# Patient Record
Sex: Female | Born: 1965
Health system: Southern US, Community
[De-identification: ages and names within clinical notes are randomized; demographics above are authoritative.]

## PROBLEM LIST (undated history)

## (undated) DIAGNOSIS — I2510 Atherosclerotic heart disease of native coronary artery without angina pectoris: Secondary | ICD-10-CM

## (undated) DIAGNOSIS — Q249 Congenital malformation of heart, unspecified: Secondary | ICD-10-CM

## (undated) DIAGNOSIS — I499 Cardiac arrhythmia, unspecified: Secondary | ICD-10-CM

## (undated) DIAGNOSIS — Z98891 History of uterine scar from previous surgery: Secondary | ICD-10-CM

## (undated) DIAGNOSIS — R0600 Dyspnea, unspecified: Secondary | ICD-10-CM

## (undated) DIAGNOSIS — Z955 Presence of coronary angioplasty implant and graft: Secondary | ICD-10-CM

## (undated) DIAGNOSIS — I219 Acute myocardial infarction, unspecified: Secondary | ICD-10-CM

## (undated) DIAGNOSIS — I10 Essential (primary) hypertension: Secondary | ICD-10-CM

## (undated) DIAGNOSIS — I519 Heart disease, unspecified: Secondary | ICD-10-CM

## (undated) DIAGNOSIS — Z87442 Personal history of urinary calculi: Secondary | ICD-10-CM

## (undated) DIAGNOSIS — F419 Anxiety disorder, unspecified: Secondary | ICD-10-CM

## (undated) DIAGNOSIS — N939 Abnormal uterine and vaginal bleeding, unspecified: Secondary | ICD-10-CM

## (undated) HISTORY — DX: Heart disease, unspecified: I51.9

## (undated) HISTORY — PX: CARDIAC SURGERY: SHX584

## (undated) HISTORY — DX: Morbid (severe) obesity due to excess calories: E66.01

## (undated) HISTORY — DX: Abnormal uterine and vaginal bleeding, unspecified: N93.9

## (undated) HISTORY — DX: Congenital malformation of heart, unspecified: Q24.9

## (undated) HISTORY — PX: CORONARY ANGIOPLASTY: SHX604

## (undated) HISTORY — PX: CARDIAC CATHETERIZATION: SHX172

---

## 2004-01-31 HISTORY — PX: CHOLECYSTECTOMY: SHX55

## 2009-01-30 HISTORY — PX: DILATION AND CURETTAGE OF UTERUS: SHX78

## 2009-01-30 HISTORY — PX: ENDOMETRIAL ABLATION: SHX621

## 2010-07-14 ENCOUNTER — Ambulatory Visit: Payer: Self-pay | Admitting: Internal Medicine

## 2012-11-21 ENCOUNTER — Ambulatory Visit: Payer: Self-pay | Admitting: Family Medicine

## 2014-04-06 ENCOUNTER — Other Ambulatory Visit: Payer: Self-pay

## 2014-04-06 ENCOUNTER — Encounter (HOSPITAL_COMMUNITY): Payer: Self-pay | Admitting: Physician Assistant

## 2014-04-06 ENCOUNTER — Inpatient Hospital Stay (HOSPITAL_COMMUNITY)
Admission: RE | Admit: 2014-04-06 | Discharge: 2014-04-09 | DRG: 247 | Disposition: A | Discharge status: Home / Self Care | Payer: BLUE CROSS/BLUE SHIELD | Source: Ambulatory Visit | Attending: Interventional Cardiology | Admitting: Interventional Cardiology

## 2014-04-06 ENCOUNTER — Encounter (HOSPITAL_COMMUNITY)
Admission: RE | Disposition: A | Discharge status: Home / Self Care | Payer: Self-pay | Source: Ambulatory Visit | Attending: Interventional Cardiology

## 2014-04-06 ENCOUNTER — Emergency Department: Payer: Self-pay | Admitting: Emergency Medicine

## 2014-04-06 DIAGNOSIS — I472 Ventricular tachycardia, unspecified: Secondary | ICD-10-CM | POA: Insufficient documentation

## 2014-04-06 DIAGNOSIS — Z8249 Family history of ischemic heart disease and other diseases of the circulatory system: Secondary | ICD-10-CM

## 2014-04-06 DIAGNOSIS — I2510 Atherosclerotic heart disease of native coronary artery without angina pectoris: Secondary | ICD-10-CM | POA: Diagnosis present

## 2014-04-06 DIAGNOSIS — R0789 Other chest pain: Secondary | ICD-10-CM | POA: Diagnosis present

## 2014-04-06 DIAGNOSIS — I2111 ST elevation (STEMI) myocardial infarction involving right coronary artery: Secondary | ICD-10-CM

## 2014-04-06 DIAGNOSIS — E785 Hyperlipidemia, unspecified: Secondary | ICD-10-CM | POA: Diagnosis present

## 2014-04-06 DIAGNOSIS — I2119 ST elevation (STEMI) myocardial infarction involving other coronary artery of inferior wall: Principal | ICD-10-CM | POA: Diagnosis present

## 2014-04-06 DIAGNOSIS — I10 Essential (primary) hypertension: Secondary | ICD-10-CM | POA: Diagnosis present

## 2014-04-06 DIAGNOSIS — I252 Old myocardial infarction: Secondary | ICD-10-CM | POA: Diagnosis present

## 2014-04-06 HISTORY — PX: LEFT HEART CATHETERIZATION WITH CORONARY ANGIOGRAM: SHX5451

## 2014-04-06 HISTORY — DX: Essential (primary) hypertension: I10

## 2014-04-06 LAB — COMPREHENSIVE METABOLIC PANEL
ALK PHOS: 95 U/L (ref 39–117)
ALT: 42 U/L — AB (ref 0–35)
AST: 73 U/L — ABNORMAL HIGH (ref 0–37)
Albumin: 4 g/dL (ref 3.5–5.2)
Anion gap: 9 (ref 5–15)
BUN: 17 mg/dL (ref 6–23)
CALCIUM: 9.1 mg/dL (ref 8.4–10.5)
CO2: 25 mmol/L (ref 19–32)
Chloride: 104 mmol/L (ref 96–112)
Creatinine, Ser: 0.7 mg/dL (ref 0.50–1.10)
GFR calc Af Amer: >90 mL/min (ref >90)
Glucose, Bld: 100 mg/dL — ABNORMAL HIGH (ref 70–99)
POTASSIUM: 3.8 mmol/L (ref 3.5–5.1)
SODIUM: 138 mmol/L (ref 135–145)
Total Bilirubin: 0.5 mg/dL (ref 0.3–1.2)
Total Protein: 7.1 g/dL (ref 6.0–8.3)

## 2014-04-06 LAB — CBC WITH DIFFERENTIAL/PLATELET
Basophils Absolute: 0 10*3/uL (ref 0.0–0.1)
Basophils Relative: 0 % (ref 0–1)
Eosinophils Absolute: 0 10*3/uL (ref 0.0–0.7)
Eosinophils Relative: 0 % (ref 0–5)
HCT: 38.5 % (ref 36.0–46.0)
HEMOGLOBIN: 13.5 g/dL (ref 12.0–15.0)
LYMPHS ABS: 1.7 10*3/uL (ref 0.7–4.0)
Lymphocytes Relative: 14 % (ref 12–46)
MCH: 28.9 pg (ref 26.0–34.0)
MCHC: 35.1 g/dL (ref 30.0–36.0)
MCV: 82.4 fL (ref 78.0–100.0)
MONOS PCT: 3 % (ref 3–12)
Monocytes Absolute: 0.3 10*3/uL (ref 0.1–1.0)
NEUTROS PCT: 83 % — AB (ref 43–77)
Neutro Abs: 10.2 10*3/uL — ABNORMAL HIGH (ref 1.7–7.7)
Platelets: 279 10*3/uL (ref 150–400)
RBC: 4.67 MIL/uL (ref 3.87–5.11)
RDW: 12.6 % (ref 11.5–15.5)
WBC: 12.2 10*3/uL — ABNORMAL HIGH (ref 4.0–10.5)

## 2014-04-06 LAB — TROPONIN I
Troponin I: 5.23 ng/mL (ref <0.031)
Troponin I: 30.11 ng/mL (ref <0.031)

## 2014-04-06 LAB — TSH: TSH: 2.145 u[IU]/mL (ref 0.350–4.500)

## 2014-04-06 LAB — MRSA PCR SCREENING: MRSA BY PCR: NEGATIVE

## 2014-04-06 SURGERY — LEFT HEART CATHETERIZATION WITH CORONARY ANGIOGRAM
Anesthesia: LOCAL

## 2014-04-06 MED ORDER — MORPHINE SULFATE 2 MG/ML IJ SOLN
2.0000 mg | INTRAMUSCULAR | Status: DC | PRN
  Administered 2014-04-06 (×2): 2 mg via INTRAVENOUS
  Filled 2014-04-06 (×2): qty 1

## 2014-04-06 MED ORDER — NITROGLYCERIN 0.4 MG SL SUBL
0.4000 mg | SUBLINGUAL_TABLET | SUBLINGUAL | Status: DC | PRN
  Administered 2014-04-06: 0.4 mg via SUBLINGUAL
  Filled 2014-04-06: qty 1

## 2014-04-06 MED ORDER — ASPIRIN EC 81 MG PO TBEC: 81.0000 mg | DELAYED_RELEASE_TABLET | Freq: Every day | ORAL | Status: DC

## 2014-04-06 MED ORDER — TICAGRELOR 90 MG PO TABS
90.0000 mg | ORAL_TABLET | Freq: Two times a day (BID) | ORAL | Status: DC
  Administered 2014-04-06 – 2014-04-09 (×6): 90 mg via ORAL
  Filled 2014-04-06 (×7): qty 1

## 2014-04-06 MED ORDER — SODIUM CHLORIDE 0.9 % IV SOLN
1.0000 mL/kg/h | INTRAVENOUS | Status: AC
Start: 2014-04-06 — End: 2014-04-06
  Administered 2014-04-06: 1 mL/kg/h via INTRAVENOUS

## 2014-04-06 MED ORDER — ATORVASTATIN CALCIUM 80 MG PO TABS
80.0000 mg | ORAL_TABLET | Freq: Every day | ORAL | Status: DC
  Administered 2014-04-06 – 2014-04-08 (×3): 80 mg via ORAL
  Filled 2014-04-06 (×3): qty 1

## 2014-04-06 MED ORDER — ACETAMINOPHEN 325 MG PO TABS
650.0000 mg | ORAL_TABLET | ORAL | Status: DC | PRN
  Administered 2014-04-07: 650 mg via ORAL
  Filled 2014-04-06: qty 2

## 2014-04-06 MED ORDER — AMIODARONE LOAD VIA INFUSION
150.0000 mg | Freq: Once | INTRAVENOUS | Status: AC
  Administered 2014-04-06: 150 mg via INTRAVENOUS
  Filled 2014-04-06: qty 83.34

## 2014-04-06 MED ORDER — ONDANSETRON HCL 4 MG/2ML IJ SOLN
4.0000 mg | Freq: Four times a day (QID) | INTRAMUSCULAR | Status: DC | PRN
  Administered 2014-04-07: 4 mg via INTRAVENOUS
  Filled 2014-04-06: qty 2

## 2014-04-06 MED ORDER — ASPIRIN 81 MG PO CHEW
81.0000 mg | CHEWABLE_TABLET | Freq: Every day | ORAL | Status: DC
  Administered 2014-04-07 – 2014-04-09 (×3): 81 mg via ORAL
  Filled 2014-04-06 (×3): qty 1

## 2014-04-06 MED ORDER — METOPROLOL TARTRATE 25 MG PO TABS
25.0000 mg | ORAL_TABLET | Freq: Two times a day (BID) | ORAL | Status: DC
  Administered 2014-04-06 – 2014-04-09 (×6): 25 mg via ORAL
  Filled 2014-04-06 (×6): qty 1

## 2014-04-06 MED ORDER — METOPROLOL TARTRATE 12.5 MG HALF TABLET
12.5000 mg | ORAL_TABLET | Freq: Two times a day (BID) | ORAL | Status: DC
  Filled 2014-04-06: qty 1

## 2014-04-06 MED ORDER — AMIODARONE HCL IN DEXTROSE 360-4.14 MG/200ML-% IV SOLN
30.0000 mg/h | INTRAVENOUS | Status: DC
  Administered 2014-04-07 (×2): 30 mg/h via INTRAVENOUS
  Filled 2014-04-06 (×7): qty 200

## 2014-04-06 MED ORDER — AMIODARONE HCL IN DEXTROSE 360-4.14 MG/200ML-% IV SOLN
60.0000 mg/h | INTRAVENOUS | Status: AC
  Administered 2014-04-06 (×2): 60 mg/h via INTRAVENOUS
  Filled 2014-04-06: qty 200

## 2014-04-06 MED ORDER — ACETAMINOPHEN 325 MG PO TABS: 650.0000 mg | ORAL_TABLET | ORAL | Status: DC | PRN

## 2014-04-06 MED ORDER — ONDANSETRON HCL 4 MG/2ML IJ SOLN
4.0000 mg | Freq: Four times a day (QID) | INTRAMUSCULAR | Status: DC | PRN
Start: 2014-04-06 — End: 2014-04-06

## 2014-04-06 MED ORDER — SODIUM CHLORIDE 0.9 % IV SOLN
0.2500 mg/kg/h | INTRAVENOUS | Status: AC
  Filled 2014-04-06: qty 250

## 2014-04-06 MED ORDER — ASPIRIN 81 MG PO CHEW: 81.0000 mg | CHEWABLE_TABLET | Freq: Every day | ORAL | Status: DC

## 2014-04-06 MED ORDER — AMIODARONE HCL IN DEXTROSE 360-4.14 MG/200ML-% IV SOLN
INTRAVENOUS | Status: AC
  Administered 2014-04-06: 200 mL
  Filled 2014-04-06: qty 200

## 2014-04-06 NOTE — H&P (Signed)
Patient ID: Sheri [name] MRN: [id number], DOB/AGE: March 20, 1965   Admit date: 04/06/2014   Primary Physician: No primary care provider on file. Primary Cardiologist: New - Dr. Irish Lack  Pt. Profile:  49 year old Caucasian female with past medical history of hypertension, however no past cardiac history present with inferior STEMI.  Problem List  Past Medical History  Diagnosis Date  . Hypertension     Past Surgical History  Procedure Laterality Date  . Cesarean section       Allergies  No Known Allergies  HPI  The patient is a 49 year old Caucasian female with past medical history of hypertension, however no past cardiac history. She does have significant family history of early CAD, notably his father died of MI at age 44. She denies any smoking history, drinking history or any illicit drug use. She started having severe substernal chest pressure around 9:30 AM on 04/06/2014. She subsequently called EMS and was transported to [facility]. EKG showed ST elevation in the inferior lead with corresponding ST depression in anterior lead. Labs shown below. She was transferred emergently to [facility]. The patient was taken emergently to cath lab for cardiac catheterization.  In route, she was given IV heparin and Zofran. On arrival, she continued to complain of 5 out of 10 chest pressure which is she states this is new for her. She also endorsed some shortness of breath as well. She denies any history of bleeding, upcoming surgeries or history of stroke. There is no obvious contraindication to drug eluding stent.  Home Medications  Prior to Admission medications   Not on File    Family History  Family History  Problem Relation Age of Onset  . Heart attack Father 84    died of MI at age 76    Social History  History   Social History  . Marital Status: Married    Spouse Name: N/A  . Number of Children: N/A  . Years of Education: N/A   Occupational History  . Not on file.    Social History Main Topics  . Smoking status: Never Smoker   . Smokeless tobacco: Not on file  . Alcohol Use: No  . Drug Use: No  . Sexual Activity: Not on file   Other Topics Concern  . Not on file   Social History Narrative  . No narrative on file     Review of Systems Cardiovascular:  No edema, orthopnea, palpitations, paroxysmal nocturnal dyspnea. +chest pain, SOB Dermatological: No rash, lesions/masses Respiratory: No cough + dyspnea associated with chest pressure Urologic: No hematuria, dysuria Abdominal:   No vomiting. +nausea. No bleeding history Neurologic:  No visual changes, wkns, changes in mental status. All other systems reviewed and are otherwise negative except as noted above.  Physical Exam  Height _0  (1.651 m), weight 210 lb (95.255 kg).  General: appear to be uncomfortable Psych: Normal affect. Neuro: Alert and oriented X 3. Moves all extremities spontaneously. HEENT: Normal  Neck: Supple without bruits or JVD. Lungs:  Resp regular and unlabored, CTA. Heart: RRR no s3, s4, or murmurs. Abdomen: Soft, non-tender, non-distended, BS + x 4.  Extremities: No clubbing, cyanosis or edema. DP/PT/Radials 2+ and equal bilaterally.  Labs  Obtained at [facility] showed   WBC 8.9, hgb 13.6, hct 41.1, plt 262 Cr 0.89, glu 137, Na 138, K 4.3 ALT 29, AST 26, alk phos 102  ECG  NSR with ST elevation in inferior lead  ASSESSMENT AND PLAN  1. Inferior STEMI:  -  continue to have CP on arrival  - pending urgent cardiac catheterization   - no obvious contraindication to drug eluding stent  2. HTN: pending med rec by pharmacy  - per patient, she has been taking BP med for last 2 month.  SignedAlmyra Deforest, PA-C 04/06/2014, 11:16 AM   I have examined the patient and reviewed assessment and plan and discussed with patient.  Agree with above as stated.  Clear ST elevation on ECG. Plan for emergent cardiac cath. No bleeding issues. No upcoming surgery. We'll  plan on drug-eluting stent if required. She'll need aggressive secondary prevention and likely ICU admission. Further plans based on results of cardiac cath.  Lacreasha Hinds S.

## 2014-04-06 NOTE — Progress Notes (Signed)
Patient C/o chest pressure more PVC's noted. Nitro  Given EKG given and MD notified. Orders given for morphine and amiodarone gtt given. See MAR. Patient states that chest pressure is relieved.

## 2014-04-06 NOTE — Progress Notes (Signed)
   04/06/14 1100  Clinical Encounter Type  Visited With Patient;Health care provider  Visit Type Initial;Code   Chaplain was paged for a code stemi at 10:19 AM. Patient arrived around 16 AM to the hospital via EMS. Patient was sent straight to cath lab and was talking to a member of the medical team on her way up. She mentioned her husband was following en route to the hospital. Chaplain checked the ED lobby and no one had asked about the patient there yet. Sanzo Elodia Florence chaplain if husband needs support/assistance when he arrives. Gar Ponto, Chaplain  11:15 AM

## 2014-04-06 NOTE — CV Procedure (Addendum)
PROCEDURE:  Left heart catheterization with selective coronary angiography, left ventriculogram. Aspiration thrombectomy of the RCA. PCI of the RCA.  INDICATIONS:  Acute inferolateral ST elevation MI  The risks, benefits, and details of the procedure were explained to the patient.  The patient verbalized understanding and wanted to proceed.  Emergency consent was obtained.  PROCEDURE TECHNIQUE:  After Xylocaine anesthesia a 28F slender sheath was placed in the right radial artery with a single anterior needle wall stick.   IV Heparin was given.  Right coronary angiography was done using a Judkins R4 guide catheter.  Left coronary angiography was done using a Judkins L3.5 guide catheter.  Left ventriculography was done using a pigtail catheter.  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 A TR band was used for hemostasis.   CONTRAST:  Total of 165 cc.  COMPLICATIONS:  None.    HEMODYNAMICS:  Aortic pressure was 108/72; LV pressure was 105/11; LVEDP 18.  There was no gradient between the left ventricle and aorta.    ANGIOGRAPHIC DATA:   The left main coronary artery is absent. The patient has separate ostia of her LAD and circumflex.  The left anterior descending artery is a large vessel which wraps around the apex. In the mid vessel, there is mild disease. There are 3 large diagonals which appear patent.  The left circumflex artery is a large vessel which has an anomalous takeoff, from the right cusp. There does appear to be a 70% lesion proximally. There are 2 large obtuse marginal branches which appear patent. It was difficult to engage this vessel selectively.  The right coronary artery is occluded in the midportion. After intervention, it was noted that the vessel was large and dominant. There is a large posterior lateral artery and a large posterior descending artery which is widely  patent.  LEFT VENTRICULOGRAM:  Left ventricular angiogram was done in the 30 RAO projection and revealed basal inferior hypokinesis  and  normal systolic function with an estimated ejection fraction of 55 %.  LVEDP was 18 mmHg.  PCI NARRATIVE: Angiomax, Brilinta and Cangrelor were used for anticoagulation. ACT was used to check that the anticoagulation was therapeutic. A JR4 guiding catheter was used to engage the RCA. A pro-water wire was placed down the RCA across the area of occlusion in the mid RCA. A fetch catheter was advanced and aspiration thrombectomy was performed.  TIMI-3 flow was restored after the fetch catheter. At this point, her heart rate slowed down significantly. We are ready to give atropine but her heart rate picked up on her own. During the case, she had short bursts of nonsustained ventricular tachycardia. This settled down by the end of the case. A large thrombus burden was removed with the fetch catheter. A second pass was made with the fetch catheter as well. A 2.0 x 15 balloon was used to predilate. A 3.0 x 24 Synergy drug-eluting stent was then used to treat the lesion. The stent was postdilated with a 3.25 x 20 noncompliant balloon, inflated to 18 atm. There was an excellent angiographic result. Intracoronary nitroglycerin was given.  IMPRESSIONS:  1. Normal left main coronary artery. 2. Mild disease in the  left anterior descending artery and its branches. 3. 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  There is a 70% lesion proximally as the vessel bends around to the lateral  wall.  It was difficult to selectively engage this vessel. The AR-1 catheter gave [place] the best visualization. Likely, if intervention was needed in the future, a JR4 guide could be used and hopefully, a wire could be placed into the circumflex to help draw in the guide.  4. 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 This was successfully  treated with a 3.0 x 24 Synergy drug-eluting stent, postdilated to 3.3 mm in diameter. 5. Overall normal left ventricular systolic function.  LVEDP 18 mmHg.  Ejection fraction 55%.  RECOMMENDATION:  Continue dual antiplatelet therapy for at least a year. She'll need aggressive secondary prevention. Would base further evaluation of her circumflex lesion on her symptoms. She'll be watched in the ICU. Will continue her low-dose Angiomax until the bag runs out. Start high-dose statin and low-dose beta blocker.

## 2014-04-06 NOTE — Progress Notes (Addendum)
    Called to evaluate patient due to recurrent chest discomfort. The patient underwent emergency cardiac cath due to infero lateral STEMI earlier today. She had drug-eluting stent to the RCA. At the end of the procedure, she was pain-free. Over the course of the last 30 minutes, she has had recurrent chest discomfort. She describes it as a tightness. It is 5 out of 10. Her episodes occur with nonsustained ventricular tachycardia. She is having runs of 6-8 beats of nonsustained ventricular tachycardia. The patient feels it and describes it as a discomfort. Blood pressure 136/82. Heart rate 90. She appears comfortable. Regular rate and rhythm, S1-S2, no wheezing, no hematoma at the right radial site.  Telemetry reveals short runs of nonsustained ventricular tachycardia.  Plan: 1. Start IV amiodarone to help reduce the frequency of her nonsustained ventricular tachycardia. I personally reviewed her ECG. There is no evidence of recurrent ST elevation. Her ECG does not look like her precath ECG. No need for repeat angiography at this time. We'll continue IV amiodarone drip at least overnight. Would consider stopping the amiodarone tomorrow.  2. Increase metoprolol to 25 mg by mouth twice a day. Her blood pressure and heart rate will tolerate this dose.  3.  Went over medications with the patient that she will need to be on long-term including aspirin, beta blocker and statin. She is in agreement.  4.  Watch in ICU overnight. Transferring to the floor will depend on how stable her heart rhythm is.  Critical care time 45 minutes.  Jettie Booze, MD

## 2014-04-06 NOTE — Progress Notes (Signed)
eLink Physician-Brief Progress Note Patient Name: Sheri [name] DOB: 04-Mar-1965 MRN: [id number]   Date of Service  04/06/2014  HPI/Events of Note  49 yo woman admitted to 2H01 from cath lab. She underwent PTCI to RCA after acute inferolateral STEMI. She is hemodynamically stable, tell sme that she is still having some chest tightness, much better than this am.    eICU Interventions  No needs for now. We will follow.      Intervention Category Evaluation Type: New Patient Evaluation  Diontae Route S. 04/06/2014, 3:44 PM

## 2014-04-07 ENCOUNTER — Encounter (HOSPITAL_COMMUNITY): Payer: Self-pay | Admitting: *Deleted

## 2014-04-07 DIAGNOSIS — I10 Essential (primary) hypertension: Secondary | ICD-10-CM

## 2014-04-07 LAB — BASIC METABOLIC PANEL
Anion gap: 8 (ref 5–15)
BUN: 12 mg/dL (ref 6–23)
CALCIUM: 9 mg/dL (ref 8.4–10.5)
CO2: 26 mmol/L (ref 19–32)
Chloride: 102 mmol/L (ref 96–112)
Creatinine, Ser: 0.67 mg/dL (ref 0.50–1.10)
GFR calc Af Amer: >90 mL/min (ref >90)
Glucose, Bld: 124 mg/dL — ABNORMAL HIGH (ref 70–99)
POTASSIUM: 3.7 mmol/L (ref 3.5–5.1)
Sodium: 136 mmol/L (ref 135–145)

## 2014-04-07 LAB — CBC
HCT: 36.1 % (ref 36.0–46.0)
Hemoglobin: 12.3 g/dL (ref 12.0–15.0)
MCH: 28.3 pg (ref 26.0–34.0)
MCHC: 34.1 g/dL (ref 30.0–36.0)
MCV: 83.2 fL (ref 78.0–100.0)
Platelets: 260 10*3/uL (ref 150–400)
RBC: 4.34 MIL/uL (ref 3.87–5.11)
RDW: 12.8 % (ref 11.5–15.5)
WBC: 10.3 10*3/uL (ref 4.0–10.5)

## 2014-04-07 LAB — LIPID PANEL
CHOL/HDL RATIO: 5.4 ratio
CHOLESTEROL: 179 mg/dL (ref 0–200)
HDL: 33 mg/dL — ABNORMAL LOW (ref >39)
LDL Cholesterol: 121 mg/dL — ABNORMAL HIGH (ref 0–99)
Triglycerides: 125 mg/dL (ref <150)
VLDL: 25 mg/dL (ref 0–40)

## 2014-04-07 LAB — POCT I-STAT, CHEM 8
BUN: 24 mg/dL — AB (ref 6–23)
CALCIUM ION: 1.27 mmol/L — AB (ref 1.12–1.23)
CREATININE: 0.6 mg/dL (ref 0.50–1.10)
Chloride: 98 mmol/L (ref 96–112)
GLUCOSE: 123 mg/dL — AB (ref 70–99)
HCT: 38 % (ref 36.0–46.0)
Hemoglobin: 12.9 g/dL (ref 12.0–15.0)
Potassium: 3.7 mmol/L (ref 3.5–5.1)
Sodium: 135 mmol/L (ref 135–145)
TCO2: 21 mmol/L (ref 0–100)

## 2014-04-07 LAB — POCT ACTIVATED CLOTTING TIME: Activated Clotting Time: 442 seconds

## 2014-04-07 LAB — HEMOGLOBIN A1C
Hgb A1c MFr Bld: 5.6 % (ref 4.8–5.6)
Mean Plasma Glucose: 114 mg/dL

## 2014-04-07 LAB — TROPONIN I: TROPONIN I: 11.89 ng/mL — AB (ref <0.031)

## 2014-04-07 MED ORDER — ALPRAZOLAM 0.25 MG PO TABS
0.2500 mg | ORAL_TABLET | Freq: Once | ORAL | Status: AC
  Administered 2014-04-07: 0.25 mg via ORAL
  Filled 2014-04-07: qty 1

## 2014-04-07 MED ORDER — OXYCODONE-ACETAMINOPHEN 5-325 MG PO TABS
1.0000 | ORAL_TABLET | Freq: Four times a day (QID) | ORAL | Status: DC | PRN
  Administered 2014-04-07: 1 via ORAL
  Filled 2014-04-07: qty 1

## 2014-04-07 MED ORDER — LISINOPRIL 5 MG PO TABS
5.0000 mg | ORAL_TABLET | Freq: Every day | ORAL | Status: DC
  Administered 2014-04-07 – 2014-04-09 (×3): 5 mg via ORAL
  Filled 2014-04-07 (×3): qty 1

## 2014-04-07 MED FILL — Sodium Chloride IV Soln 0.9%: INTRAVENOUS | Qty: 50 | Status: AC

## 2014-04-07 MED FILL — Midazolam HCl Inj 2 MG/2ML (Base Equivalent): INTRAMUSCULAR | Qty: 2 | Status: AC

## 2014-04-07 MED FILL — Bivalirudin For IV Soln 250 MG: INTRAVENOUS | Qty: 250 | Status: AC

## 2014-04-07 MED FILL — Nitroglycerin IV Soln 100 MCG/ML in D5W: INTRA_ARTERIAL | Qty: 10 | Status: AC

## 2014-04-07 MED FILL — Heparin Sodium (Porcine) 2 Unit/ML in Sodium Chloride 0.9%: INTRAMUSCULAR | Qty: 1000 | Status: AC

## 2014-04-07 MED FILL — Fentanyl Citrate Inj 0.05 MG/ML: INTRAMUSCULAR | Qty: 2 | Status: AC

## 2014-04-07 MED FILL — Ticagrelor Tab 90 MG: ORAL | Qty: 1 | Status: AC

## 2014-04-07 MED FILL — Atropine Sulfate Inj 1 MG/ML: INTRAMUSCULAR | Qty: 1 | Status: AC

## 2014-04-07 MED FILL — Cangrelor Tetrasodium For IV Soln 50 MG: INTRAVENOUS | Qty: 50 | Status: AC

## 2014-04-07 MED FILL — Ondansetron HCl Inj 4 MG/2ML (2 MG/ML): INTRAMUSCULAR | Qty: 2 | Status: AC

## 2014-04-07 MED FILL — Lidocaine HCl Local Preservative Free (PF) Inj 1%: INTRAMUSCULAR | Qty: 30 | Status: AC

## 2014-04-07 NOTE — Progress Notes (Signed)
Subjective:  No CP/SOB, NSVT last PM, better with IV Amio  Objective:  Temp:  [97.4 F (36.3 C)-98.1 F (36.7 C)] 97.4 F (36.3 C) (03/08 0400) Pulse Rate:  [60-91] 65 (03/08 0600) Resp:  [10-21] 17 (03/08 0600) BP: (93-156)/(61-98) 106/71 mmHg (03/08 0600) SpO2:  [93 %-100 %] 99 % (03/08 0600) Weight:  [210 lb (95.255 kg)-225 lb 5 oz (102.2 kg)] 225 lb 5 oz (102.2 kg) (03/07 1247) Weight change:   Intake/Output from previous day: 03/07 0701 - 03/08 0700 In: 1429.4 [P.O.:250; I.V.:1179.4] Out: 1850 [Urine:1850]  Intake/Output from this shift:    Physical Exam: General appearance: alert and no distress Neck: no adenopathy, no carotid bruit, no JVD, supple, symmetrical, trachea midline and thyroid not enlarged, symmetric, no tenderness/mass/nodules Lungs: clear to auscultation bilaterally Heart: regular rate and rhythm, S1, S2 normal, no murmur, click, rub or gallop Extremities: Right radial puncture site OK  Lab Results: Results for orders placed or performed during the hospital encounter of 04/06/14 (from the past 48 hour(s))  MRSA PCR Screening     Status: None   Collection Time: 04/06/14 12:44 PM  Result Value Ref Range   MRSA by PCR NEGATIVE NEGATIVE    Comment:        The GeneXpert MRSA Assay (FDA approved for NASAL specimens only), is one component of a comprehensive MRSA colonization surveillance program. It is not intended to diagnose MRSA infection nor to guide or monitor treatment for MRSA infections.   TSH     Status: None   Collection Time: 04/06/14  2:55 PM  Result Value Ref Range   TSH 2.145 0.350 - 4.500 uIU/mL  Comprehensive metabolic panel     Status: Abnormal   Collection Time: 04/06/14  2:59 PM  Result Value Ref Range   Sodium 138 135 - 145 mmol/L   Potassium 3.8 3.5 - 5.1 mmol/L   Chloride 104 96 - 112 mmol/L   CO2 25 19 - 32 mmol/L   Glucose, Bld 100 (H) 70 - 99 mg/dL   BUN 17 6 - 23 mg/dL   Creatinine, Ser 0.70 0.50 - 1.10  mg/dL   Calcium 9.1 8.4 - 10.5 mg/dL   Total Protein 7.1 6.0 - 8.3 g/dL   Albumin 4.0 3.5 - 5.2 g/dL   AST 73 (H) 0 - 37 U/L   ALT 42 (H) 0 - 35 U/L   Alkaline Phosphatase 95 39 - 117 U/L   Total Bilirubin 0.5 0.3 - 1.2 mg/dL   GFR calc non Af Amer >90 >90 mL/min   GFR calc Af Amer >90 >90 mL/min    Comment: (NOTE) The eGFR has been calculated using the CKD EPI equation. This calculation has not been validated in all clinical situations. eGFR's persistently <90 mL/min signify possible Chronic Kidney Disease.    Anion gap 9 5 - 15  Troponin I-(serum)     Status: Abnormal   Collection Time: 04/06/14  2:59 PM  Result Value Ref Range   Troponin I 5.23 (HH) <0.031 ng/mL    Comment:        POSSIBLE MYOCARDIAL ISCHEMIA. SERIAL TESTING RECOMMENDED. REPEATED TO VERIFY CRITICAL RESULT CALLED TO, READ BACK BY AND VERIFIED WITH: P SHELTON,RN 1649 04/06/14 WBOND   CBC WITH DIFFERENTIAL     Status: Abnormal   Collection Time: 04/06/14  2:59 PM  Result Value Ref Range   WBC 12.2 (H) 4.0 - 10.5 K/uL   RBC 4.67 3.87 - 5.11 MIL/uL  Hemoglobin 13.5 12.0 - 15.0 g/dL   HCT 38.5 36.0 - 46.0 %   MCV 82.4 78.0 - 100.0 fL   MCH 28.9 26.0 - 34.0 pg   MCHC 35.1 30.0 - 36.0 g/dL   RDW 12.6 11.5 - 15.5 %   Platelets 279 150 - 400 K/uL   Neutrophils Relative % 83 (H) 43 - 77 %   Neutro Abs 10.2 (H) 1.7 - 7.7 K/uL   Lymphocytes Relative 14 12 - 46 %   Lymphs Abs 1.7 0.7 - 4.0 K/uL   Monocytes Relative 3 3 - 12 %   Monocytes Absolute 0.3 0.1 - 1.0 K/uL   Eosinophils Relative 0 0 - 5 %   Eosinophils Absolute 0.0 0.0 - 0.7 K/uL   Basophils Relative 0 0 - 1 %   Basophils Absolute 0.0 0.0 - 0.1 K/uL  Hemoglobin A1c     Status: None   Collection Time: 04/06/14  2:59 PM  Result Value Ref Range   Hgb A1c MFr Bld 5.6 4.8 - 5.6 %    Comment: (NOTE)         Pre-diabetes: 5.7 - 6.4         Diabetes: >6.4         Glycemic control for adults with diabetes: <7.0    Mean Plasma Glucose 114 mg/dL     Comment: (NOTE) Performed At: [facility] [place], [place] [id number] Lindon Romp MD WU:[id number]   Troponin I-(serum)     Status: Abnormal   Collection Time: 04/06/14  9:20 PM  Result Value Ref Range   Troponin I 30.11 (HH) <0.031 ng/mL    Comment:        POSSIBLE MYOCARDIAL ISCHEMIA. SERIAL TESTING RECOMMENDED. REPEATED TO VERIFY CRITICAL VALUE NOTED.  VALUE IS CONSISTENT WITH PREVIOUSLY REPORTED AND CALLED VALUE.   Troponin I-(serum)     Status: Abnormal   Collection Time: 04/07/14  3:37 AM  Result Value Ref Range   Troponin I 11.89 (HH) <0.031 ng/mL    Comment:        POSSIBLE MYOCARDIAL ISCHEMIA. SERIAL TESTING RECOMMENDED. REPEATED TO VERIFY CRITICAL VALUE NOTED.  VALUE IS CONSISTENT WITH PREVIOUSLY REPORTED AND CALLED VALUE.   Basic metabolic panel     Status: Abnormal   Collection Time: 04/07/14  3:37 AM  Result Value Ref Range   Sodium 136 135 - 145 mmol/L   Potassium 3.7 3.5 - 5.1 mmol/L   Chloride 102 96 - 112 mmol/L   CO2 26 19 - 32 mmol/L   Glucose, Bld 124 (H) 70 - 99 mg/dL   BUN 12 6 - 23 mg/dL   Creatinine, Ser 0.67 0.50 - 1.10 mg/dL   Calcium 9.0 8.4 - 10.5 mg/dL   GFR calc non Af Amer >90 >90 mL/min   GFR calc Af Amer >90 >90 mL/min    Comment: (NOTE) The eGFR has been calculated using the CKD EPI equation. This calculation has not been validated in all clinical situations. eGFR's persistently <90 mL/min signify possible Chronic Kidney Disease.    Anion gap 8 5 - 15  CBC     Status: None   Collection Time: 04/07/14  3:37 AM  Result Value Ref Range   WBC 10.3 4.0 - 10.5 K/uL   RBC 4.34 3.87 - 5.11 MIL/uL   Hemoglobin 12.3 12.0 - 15.0 g/dL   HCT 36.1 36.0 - 46.0 %   MCV 83.2 78.0 - 100.0 fL   MCH 28.3 26.0 -  34.0 pg   MCHC 34.1 30.0 - 36.0 g/dL   RDW 12.8 11.5 - 15.5 %   Platelets 260 150 - 400 K/uL  Lipid panel     Status: Abnormal   Collection Time: 04/07/14  3:37 AM  Result Value Ref Range   Cholesterol 179  0 - 200 mg/dL   Triglycerides 125 <150 mg/dL   HDL 33 (L) >39 mg/dL   Total CHOL/HDL Ratio 5.4 RATIO   VLDL 25 0 - 40 mg/dL   LDL Cholesterol 121 (H) 0 - 99 mg/dL    Comment:        Total Cholesterol/HDL:CHD Risk Coronary Heart Disease Risk Table                     Men   Women  1/2 Average Risk   3.4   3.3  Average Risk       5.0   4.4  2 X Average Risk   9.6   7.1  3 X Average Risk  23.4   11.0        Use the calculated Patient Ratio above and the CHD Risk Table to determine the patient's CHD Risk.        ATP III CLASSIFICATION (LDL):  <100     mg/dL   Optimal  100-129  mg/dL   Near or Above                    Optimal  130-159  mg/dL   Borderline  160-189  mg/dL   High  >190     mg/dL   Very High     Imaging: Imaging results have been reviewed  Tele: NSR, NSVT last PM  Assessment/Plan:   1. Active Problems: 2.   Acute MI, inferolateral wall, initial episode of care 3.   STEMI (ST elevation myocardial infarction) 4.   Ventricular tachycardia 5.   Chest discomfort 6.   Time Spent Directly with Patient:  20 minutes  Length of Stay:  LOS: 1 day    POD #1 Inferolateral STEMI Rx with DES (Synergy) mid Dom RCA. 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 Irish Lack was planning on Rx medically. Some CP over night along with NSVT, better on IV amio. Trop peaked at 30. EKG improved. Labs otherwise OK. On approp meds. Cont Amio for now. OOB to chair. CRH. Tx to step down tomorrow. On statin per protocol. LDL 121. H/O HTN on ACE-I at home.  Lorretta Harp 04/07/2014, 8:39 AM

## 2014-04-07 NOTE — Progress Notes (Signed)
CARDIAC REHAB PHASE I   PRE:  Rate/Rhythm: 61 SR    BP: sitting 108/75    SaO2:   MODE:  Ambulation: 350 ft   POST:  Rate/Rhythm: 80 SR    BP: sitting 124/70     SaO2:   Tolerated very well, no c/o until after sitting she felt slight SOB. Otherwise did well, thankful to walk. Began ed with pt. She is overwhelmed, trying to take it all in. She is very anxious to get back to the gym with her friends. She has been doing 60 min cardio plus weight lifting. Discussed gradually moving into more and more ex, starting with walking at home and doing CPRII which she is interested in. Will send referral to [facility]. She can walk independently today.   [id number]  Sheri [name] [place] CES, ACSM 04/07/2014 12:21 PM

## 2014-04-07 NOTE — Care Management Note (Signed)
    Kothari 1 of 1   04/07/2014     10:12:06 AM CARE MANAGEMENT NOTE 04/07/2014  Patient:  [facility]   Account Number:  [id number]  Date Initiated:  04/07/2014  Documentation initiated by:  Elissa Hefty  Subjective/Objective Assessment:   adm w mi     Action/Plan:   lives w fam   Anticipated DC Date:     Anticipated DC Plan:  [place]  CM consult  Medication Assistance      Choice offered to / List presented to:             Status of service:   Medicare Important Message given?   (If response is "NO", the following Medicare IM given date fields will be blank) Date Medicare IM given:   Medicare IM given by:   Date Additional Medicare IM given:   Additional Medicare IM given by:    Discharge Disposition:  HOME/SELF CARE  Per UR Regulation:  Reviewed for med. necessity/level of care/duration of stay  If discussed at [place] of Stay Meetings, dates discussed:    Comments:  3/8 1010 debbie Lindberg Zenon rn,bsn gave pt 30day free and brilinta pt copay card. pt has [employer]bs ins.

## 2014-04-08 NOTE — Progress Notes (Signed)
Subjective:  POD # 2 inferior STEMI. No CP/SOB  Objective:  Temp:  [97.9 F (36.6 C)-98.1 F (36.7 C)] 98 F (36.7 C) (03/09 0310) Pulse Rate:  [60-73] 70 (03/09 0821) Resp:  [16-18] 16 (03/09 0821) BP: (93-114)/(52-74) 102/68 mmHg (03/09 0821) SpO2:  [94 %-99 %] 98 % (03/09 0821) Weight change:   Intake/Output from previous day: 03/08 0701 - 03/09 0700 In: 1037.3 [P.O.:720; I.V.:317.3] Out: -   Intake/Output from this shift: Total I/O In: 290.1 [P.O.:240; I.V.:50.1] Out: -   Physical Exam: General appearance: alert and no distress Neck: no adenopathy, no carotid bruit, no JVD, supple, symmetrical, trachea midline and thyroid not enlarged, symmetric, no tenderness/mass/nodules Lungs: clear to auscultation bilaterally Heart: regular rate and rhythm, S1, S2 normal, no murmur, click, rub or gallop Extremities: extremities normal, atraumatic, no cyanosis or edema  Lab Results: Results for orders placed or performed during the hospital encounter of 04/06/14 (from the past 48 hour(s))  POCT Activated clotting time     Status: None   Collection Time: 04/06/14 11:24 AM  Result Value Ref Range   Activated Clotting Time 442 seconds  I-STAT, chem 8     Status: Abnormal   Collection Time: 04/06/14 11:25 AM  Result Value Ref Range   Sodium 135 135 - 145 mmol/L   Potassium 3.7 3.5 - 5.1 mmol/L   Chloride 98 96 - 112 mmol/L   BUN 24 (H) 6 - 23 mg/dL   Creatinine, Ser 0.60 0.50 - 1.10 mg/dL   Glucose, Bld 123 (H) 70 - 99 mg/dL   Calcium, Ion 1.27 (H) 1.12 - 1.23 mmol/L   TCO2 21 0 - 100 mmol/L   Hemoglobin 12.9 12.0 - 15.0 g/dL   HCT 38.0 36.0 - 46.0 %  MRSA PCR Screening     Status: None   Collection Time: 04/06/14 12:44 PM  Result Value Ref Range   MRSA by PCR NEGATIVE NEGATIVE    Comment:        The GeneXpert MRSA Assay (FDA approved for NASAL specimens only), is one component of a comprehensive MRSA colonization surveillance program. It is not intended to  diagnose MRSA infection nor to guide or monitor treatment for MRSA infections.   TSH     Status: None   Collection Time: 04/06/14  2:55 PM  Result Value Ref Range   TSH 2.145 0.350 - 4.500 uIU/mL  Comprehensive metabolic panel     Status: Abnormal   Collection Time: 04/06/14  2:59 PM  Result Value Ref Range   Sodium 138 135 - 145 mmol/L   Potassium 3.8 3.5 - 5.1 mmol/L   Chloride 104 96 - 112 mmol/L   CO2 25 19 - 32 mmol/L   Glucose, Bld 100 (H) 70 - 99 mg/dL   BUN 17 6 - 23 mg/dL   Creatinine, Ser 0.70 0.50 - 1.10 mg/dL   Calcium 9.1 8.4 - 10.5 mg/dL   Total Protein 7.1 6.0 - 8.3 g/dL   Albumin 4.0 3.5 - 5.2 g/dL   AST 73 (H) 0 - 37 U/L   ALT 42 (H) 0 - 35 U/L   Alkaline Phosphatase 95 39 - 117 U/L   Total Bilirubin 0.5 0.3 - 1.2 mg/dL   GFR calc non Af Amer >90 >90 mL/min   GFR calc Af Amer >90 >90 mL/min    Comment: (NOTE) The eGFR has been calculated using the CKD EPI equation. This calculation has not been validated in all clinical situations.  eGFR's persistently <90 mL/min signify possible Chronic Kidney Disease.    Anion gap 9 5 - 15  Troponin I-(serum)     Status: Abnormal   Collection Time: 04/06/14  2:59 PM  Result Value Ref Range   Troponin I 5.23 (HH) <0.031 ng/mL    Comment:        POSSIBLE MYOCARDIAL ISCHEMIA. SERIAL TESTING RECOMMENDED. REPEATED TO VERIFY CRITICAL RESULT CALLED TO, READ BACK BY AND VERIFIED WITH: P SHELTON,RN 1649 04/06/14 WBOND   CBC WITH DIFFERENTIAL     Status: Abnormal   Collection Time: 04/06/14  2:59 PM  Result Value Ref Range   WBC 12.2 (H) 4.0 - 10.5 K/uL   RBC 4.67 3.87 - 5.11 MIL/uL   Hemoglobin 13.5 12.0 - 15.0 g/dL   HCT 38.5 36.0 - 46.0 %   MCV 82.4 78.0 - 100.0 fL   MCH 28.9 26.0 - 34.0 pg   MCHC 35.1 30.0 - 36.0 g/dL   RDW 12.6 11.5 - 15.5 %   Platelets 279 150 - 400 K/uL   Neutrophils Relative % 83 (H) 43 - 77 %   Neutro Abs 10.2 (H) 1.7 - 7.7 K/uL   Lymphocytes Relative 14 12 - 46 %   Lymphs Abs 1.7 0.7 -  4.0 K/uL   Monocytes Relative 3 3 - 12 %   Monocytes Absolute 0.3 0.1 - 1.0 K/uL   Eosinophils Relative 0 0 - 5 %   Eosinophils Absolute 0.0 0.0 - 0.7 K/uL   Basophils Relative 0 0 - 1 %   Basophils Absolute 0.0 0.0 - 0.1 K/uL  Hemoglobin A1c     Status: None   Collection Time: 04/06/14  2:59 PM  Result Value Ref Range   Hgb A1c MFr Bld 5.6 4.8 - 5.6 %    Comment: (NOTE)         Pre-diabetes: 5.7 - 6.4         Diabetes: >6.4         Glycemic control for adults with diabetes: <7.0    Mean Plasma Glucose 114 mg/dL    Comment: (NOTE) Performed At: [facility] [place], [place] [id number] Lindon Romp MD WU:[id number]   Troponin I-(serum)     Status: Abnormal   Collection Time: 04/06/14  9:20 PM  Result Value Ref Range   Troponin I 30.11 (HH) <0.031 ng/mL    Comment:        POSSIBLE MYOCARDIAL ISCHEMIA. SERIAL TESTING RECOMMENDED. REPEATED TO VERIFY CRITICAL VALUE NOTED.  VALUE IS CONSISTENT WITH PREVIOUSLY REPORTED AND CALLED VALUE.   Troponin I-(serum)     Status: Abnormal   Collection Time: 04/07/14  3:37 AM  Result Value Ref Range   Troponin I 11.89 (HH) <0.031 ng/mL    Comment:        POSSIBLE MYOCARDIAL ISCHEMIA. SERIAL TESTING RECOMMENDED. REPEATED TO VERIFY CRITICAL VALUE NOTED.  VALUE IS CONSISTENT WITH PREVIOUSLY REPORTED AND CALLED VALUE.   Basic metabolic panel     Status: Abnormal   Collection Time: 04/07/14  3:37 AM  Result Value Ref Range   Sodium 136 135 - 145 mmol/L   Potassium 3.7 3.5 - 5.1 mmol/L   Chloride 102 96 - 112 mmol/L   CO2 26 19 - 32 mmol/L   Glucose, Bld 124 (H) 70 - 99 mg/dL   BUN 12 6 - 23 mg/dL   Creatinine, Ser 0.67 0.50 - 1.10 mg/dL   Calcium 9.0 8.4 - 10.5 mg/dL   GFR  calc non Af Amer >90 >90 mL/min   GFR calc Af Amer >90 >90 mL/min    Comment: (NOTE) The eGFR has been calculated using the CKD EPI equation. This calculation has not been validated in all clinical situations. eGFR's persistently <90  mL/min signify possible Chronic Kidney Disease.    Anion gap 8 5 - 15  CBC     Status: None   Collection Time: 04/07/14  3:37 AM  Result Value Ref Range   WBC 10.3 4.0 - 10.5 K/uL   RBC 4.34 3.87 - 5.11 MIL/uL   Hemoglobin 12.3 12.0 - 15.0 g/dL   HCT 36.1 36.0 - 46.0 %   MCV 83.2 78.0 - 100.0 fL   MCH 28.3 26.0 - 34.0 pg   MCHC 34.1 30.0 - 36.0 g/dL   RDW 12.8 11.5 - 15.5 %   Platelets 260 150 - 400 K/uL  Lipid panel     Status: Abnormal   Collection Time: 04/07/14  3:37 AM  Result Value Ref Range   Cholesterol 179 0 - 200 mg/dL   Triglycerides 125 <150 mg/dL   HDL 33 (L) >39 mg/dL   Total CHOL/HDL Ratio 5.4 RATIO   VLDL 25 0 - 40 mg/dL   LDL Cholesterol 121 (H) 0 - 99 mg/dL    Comment:        Total Cholesterol/HDL:CHD Risk Coronary Heart Disease Risk Table                     Men   Women  1/2 Average Risk   3.4   3.3  Average Risk       5.0   4.4  2 X Average Risk   9.6   7.1  3 X Average Risk  23.4   11.0        Use the calculated Patient Ratio above and the CHD Risk Table to determine the patient's CHD Risk.        ATP III CLASSIFICATION (LDL):  <100     mg/dL   Optimal  100-129  mg/dL   Near or Above                    Optimal  130-159  mg/dL   Borderline  160-189  mg/dL   High  >190     mg/dL   Very High     Imaging: Imaging results have been reviewed  Tele- NSR w/o NSVT  Assessment/Plan:   1. Active Problems: 2.   Acute MI, inferolateral wall, initial episode of care 3.   STEMI (ST elevation myocardial infarction) 4.   Ventricular tachycardia 5.   Chest discomfort 6.   Time Spent Directly with Patient:  20 minutes  Length of Stay:  LOS: 2 days    Pt did well last PM. POD # 2 Inf STEMI. DES RCA. Mod Dz prox anomalous LCX. Peak trop 30. Nl LV FXN. Pt had NSVT the night before last, none since on IV amio which I am stopping. On Lopressor 25 mg PO BID with HR 70s. Can titrate if recurrent PVCs / NSVT. On DAPT. OK to [facility] tele. [facility]. Prob home  tomorrow. F/U as OP with Dr. Irish Lack.   Virl Coble J 04/08/2014, 10:50 AM

## 2014-04-08 NOTE — Progress Notes (Signed)
Transferred to [place] room 4 by wheelchair, stable, report given to RN. Belongings with pt.

## 2014-04-08 NOTE — Progress Notes (Signed)
CARDIAC REHAB PHASE I   PRE:  Rate/Rhythm: 62 SR    BP: sitting 106/75    SaO2:   MODE:  Ambulation: 700 ft   POST:  Rate/Rhythm: 76 SR    BP: sitting 121/72     SaO2:   Tolerated very well, feels good. Has been walking throughout the night. Pt asked how much she could do, given instructions. Finished ed with walking at home, NTG, CRPII. Pt with good understanding. Husband present. [phone]   Josephina Shih [place] CES, ACSM 04/08/2014 12:22 PM

## 2014-04-09 ENCOUNTER — Telehealth: Payer: Self-pay | Admitting: Nurse Practitioner

## 2014-04-09 DIAGNOSIS — E785 Hyperlipidemia, unspecified: Secondary | ICD-10-CM

## 2014-04-09 MED ORDER — TICAGRELOR 90 MG PO TABS: 90.0000 mg | ORAL_TABLET | Freq: Two times a day (BID) | ORAL | Status: DC

## 2014-04-09 MED ORDER — ATORVASTATIN CALCIUM 80 MG PO TABS: 80.0000 mg | ORAL_TABLET | Freq: Every day | ORAL | Status: DC

## 2014-04-09 MED ORDER — ASPIRIN 81 MG PO CHEW: 81.0000 mg | CHEWABLE_TABLET | Freq: Every day | ORAL | Status: DC

## 2014-04-09 MED ORDER — NITROGLYCERIN 0.4 MG SL SUBL: 0.4000 mg | SUBLINGUAL_TABLET | SUBLINGUAL | Status: DC | PRN

## 2014-04-09 MED ORDER — LISINOPRIL 5 MG PO TABS: 5.0000 mg | ORAL_TABLET | Freq: Every day | ORAL | Status: DC

## 2014-04-09 MED ORDER — METOPROLOL TARTRATE 25 MG PO TABS
25.0000 mg | ORAL_TABLET | Freq: Two times a day (BID) | ORAL | Status: DC
Start: 2014-04-09 — End: 2014-05-04

## 2014-04-09 NOTE — Progress Notes (Signed)
Patient Profile: 49 year old Caucasian female with past medical history of hypertension, however no past cardiac history admitted 04/06/14 with inferior STEMI.  Subjective: No complaints. No further CP or dyspnea. Denies palpitations. Ambulating w/o difficulty.   Objective: Vital signs in last 24 hours: Temp:  [98.1 F (36.7 C)-98.2 F (36.8 C)] 98.1 F (36.7 C) (03/10 0624) Pulse Rate:  [65-73] 73 (03/10 0624) Resp:  [16-18] 16 (03/10 0624) BP: (100-121)/(57-72) 115/63 mmHg (03/10 0624) SpO2:  [94 %-98 %] 98 % (03/10 0624) Weight:  [220 lb 7.4 oz (100 kg)-225 lb 5 oz (102.2 kg)] 220 lb 7.4 oz (100 kg) (03/10 0624) Last BM Date: 04/06/14  Intake/Output from previous day: 03/09 0701 - 03/10 0700 In: 700.1 [P.O.:650; I.V.:50.1] Out: -  Intake/Output this shift:    Medications Current Facility-Administered Medications  Medication Dose Route Frequency Provider Last Rate Last Dose  . acetaminophen (TYLENOL) tablet 650 mg  650 mg Oral Q4H PRN Jettie Booze, MD   650 mg at 04/07/14 1710  . aspirin chewable tablet 81 mg  81 mg Oral Daily Jettie Booze, MD   81 mg at 04/08/14 0920  . atorvastatin (LIPITOR) tablet 80 mg  80 mg Oral q1800 Jettie Booze, MD   80 mg at 04/08/14 1702  . lisinopril (PRINIVIL,ZESTRIL) tablet 5 mg  5 mg Oral Daily Lorretta Harp, MD   5 mg at 04/08/14 0920  . metoprolol tartrate (LOPRESSOR) tablet 25 mg  25 mg Oral BID Jettie Booze, MD   25 mg at 04/08/14 2119  . morphine 2 MG/ML injection 2-4 mg  2-4 mg Intravenous Q1H PRN Evelene Croon Barrett, PA-C   2 mg at 04/06/14 2054  . nitroGLYCERIN (NITROSTAT) SL tablet 0.4 mg  0.4 mg Sublingual Q5 Min x 3 PRN Almyra Deforest, PA   0.4 mg at 04/06/14 1620  . ondansetron (ZOFRAN) injection 4 mg  4 mg Intravenous Q6H PRN Jettie Booze, MD   4 mg at 04/07/14 3825  . oxyCODONE-acetaminophen (PERCOCET/ROXICET) 5-325 MG per tablet 1-2 tablet  1-2 tablet Oral Q6H PRN Larey Dresser, MD   1 tablet at  04/07/14 0103  . ticagrelor (BRILINTA) tablet 90 mg  90 mg Oral BID Jettie Booze, MD   90 mg at 04/08/14 2118    PE: General appearance: alert, cooperative and no distress Neck: no carotid bruit and no JVD Lungs: clear to auscultation bilaterally Heart: regular rate and rhythm, S1, S2 normal, no murmur, click, rub or gallop Extremities: no LEE Pulses: 2+ and symmetric Skin: warm and dry Neurologic: Grossly normal  Lab Results:   Recent Labs  04/06/14 1125 04/06/14 1459 04/07/14 0337  WBC  --  12.2* 10.3  HGB 12.9 13.5 12.3  HCT 38.0 38.5 36.1  PLT  --  279 260   BMET  Recent Labs  04/06/14 1125 04/06/14 1459 04/07/14 0337  NA 135 138 136  K 3.7 3.8 3.7  CL 98 104 102  CO2  --  25 26  GLUCOSE 123* 100* 124*  BUN 24* 17 12  CREATININE 0.60 0.70 0.67  CALCIUM  --  9.1 9.0   PT/INR No results for input(s): LABPROT, INR in the last 72 hours. Cholesterol  Recent Labs  04/07/14 0337  CHOL 179   Cardiac Panel (last 3 results)  Recent Labs  04/06/14 1459 04/06/14 2120 04/07/14 0337  TROPONINI 5.23* 30.11* 11.89*    Studies/Results:  LHC 04/06/14 IMPRESSIONS:  1. Normal left main coronary artery.  2. Mild disease in the left anterior descending artery and its branches. 3. 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 There is a 70% lesion proximally as the vessel bends around to the lateral wall. It was difficult to selectively engage this vessel. The AR-1 catheter gave [place] the best visualization. Likely, if intervention was needed in the future, a JR4 guide could be used and hopefully, a wire could be placed into the circumflex to help draw in the guide.  4. 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 This was successfully treated with a 3.0 x 24 Synergy drug-eluting stent, postdilated to 3.3 mm in diameter. 5. Overall normal left ventricular systolic function. LVEDP 18 mmHg. Ejection  fraction 55%.   Assessment/Plan  Active Problems:   Acute MI, inferolateral wall, initial episode of care   STEMI (ST elevation myocardial infarction)   Ventricular tachycardia   Chest discomfort   1. 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 No further CP. Continue DAPT for a minimum of 1 yr (ASA + Brilinta). Continue statin, BB and ACE-I therapy (Lipitor, metoprolol and lisinopril). BP and HR both stable. Plan for OP referral for phase II CR.    2. NSVT: post reperfusion. Maintaining NSR. No recurrent arrhthymias on telemetry in the last 12 hrs. IV amiodarone discontinued yesterday. On oral metoprolol. Resting HR in the 80s. Normal EF on cath at 55%.   3. HTN:  Well controlled on BB + ACE.   4. HLD: LDL elevated at 121 mg/dL. Continue high dose statin therapy w/ 80 mg of Lipitor. Goal LDL is <70. Recheck FLP and LFTs in 6 weeks.   Dispo: Possible d/c home today.    LOS: 3 days    Brittainy M. Ladoris Gene 04/09/2014 7:48 AM  Personally seen and examined. Agree with above. Agree with DC home No further NSVT Bb Meds as above Close f/u.  Wrist site looks good.   Candee Furbish, MD

## 2014-04-09 NOTE — Telephone Encounter (Signed)
New message      TCM appt on 04-17-14 per [place].

## 2014-04-09 NOTE — Discharge Summary (Signed)
Physician Discharge Summary  Patient ID: Sheri [name] MRN: [id number] DOB/AGE: 02-02-1965 49 y.o.   Primary Cardiologist: Dr. Irish Lack ([place])  Admit date: 04/06/2014 Discharge date: 04/09/2014  Admission Diagnoses: Inferolateral STEMI  Discharge Diagnoses:  Active Problems:   Acute MI, inferolateral wall, initial episode of care   STEMI (ST elevation myocardial infarction)   Ventricular tachycardia   Chest discomfort   Discharged Condition: stable  Hospital Course: The patient is a 49 year old Caucasian female with a past medical history of hypertension, however no past cardiac history. She does have significant family history of early CAD, notably her father died of MI at age 41. She denies any smoking history, drinking history or any illicit drug use. She started having severe substernal chest pressure around 9:30 AM on 04/06/2014. She subsequently called EMS and was transported to [facility]. EKG showed ST elevation in the inferior lead with corresponding ST depression in anterior lead. Troponin peaked at 30. She was transferred emergently to [facility]. In route, she was given IV heparin and Zofran. On arrival, she continued to complain of 5 out of 10 chest pressure. The patient was taken emergently to the cath lab for cardiac catheterization. The procedure was performed by Dr. Irish Lack. Access was obtained via the right radial artery. She was found to have an occluded mid right coronary artery which was the culprit vessel, successfully treated with PCI + DES. 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 There was a 70% lesion proximally as the vessel bends around to the lateral wall. Medical therapy was elected. She had a normal left main and only mild diease in the LAD and its branches. LVF was normal with EF of 55%. She tolerated the procedure well and left the cath lab in stable condition. Her chest pain resolved. Troponin levels trended downward. She was placed  on DAPT with ASA + Brilinta, as well as high dose statin therapy with Lipitor (LDL elevated at 121 mg/dL), BB therapy with metoprolol and ACE-I therapy with lisinopril. Post procedure, she had NSVT, requiring IV amiodarone. This ultimately resolved and IV amiodarone was discontinued. She was monitored on telemetry and maintained NSR on BB therapy. She had no difficulties ambulating with cardiac rehab. Vitals signs remained stable. Her right radial access site remained stable. She was last seen and examined by Dr. Marlou Porch who determined she was stable for discharge home. Post hospital f/u has been scheduled with Truitt Merle, NP, on 04/17/14. F/U with Dr. Irish Lack has been scheduled for 05/12/14. She was given a free 30 day Brilinta Card and PRN SL NTG. Repeat FLP and LFTs will need to be rechecked in 4-6 weeks.    Consults: None  Significant Diagnostic Studies:   Cardiac Panel (last 3 results)   Recent Labs  04/06/14 1459 04/06/14 2120 04/07/14 0337  TROPONINI 5.23* 30.11* 11.89*    LHC 04/06/14  HEMODYNAMICS: Aortic pressure was 108/72; LV pressure was 105/11; LVEDP 18. There was no gradient between the left ventricle and aorta.   ANGIOGRAPHIC DATA: The left main coronary artery is absent. The patient has separate ostia of her LAD and circumflex.  The left anterior descending artery is a large vessel which wraps around the apex. In the mid vessel, there is mild disease. There are 3 large diagonals which appear patent.  The left circumflex artery is a large vessel which has an anomalous takeoff, from the right cusp. There does appear to be a 70% lesion proximally. There are 2 large  obtuse marginal branches which appear patent. It was difficult to engage this vessel selectively.  The right coronary artery is occluded in the midportion. After intervention, it was noted that the vessel was large and dominant. There is a large posterior lateral artery and a large posterior descending artery  which is widely patent.  LEFT VENTRICULOGRAM: Left ventricular angiogram was done in the 30 RAO projection and revealed basal inferior hypokinesis and normal systolic function with an estimated ejection fraction of 55 %. LVEDP was 18 mmHg.   Treatments: See Hospital Course   Discharge Exam: Blood pressure 115/63, pulse 73, temperature 98.1 F (36.7 C), temperature source Oral, resp. rate 16, height 5\' 5"  (1.651 m), weight 220 lb 7.4 oz (100 kg), SpO2 98 %.   Disposition: Final discharge disposition not confirmed  Discharge Instructions    Amb Referral to Cardiac Rehabilitation    Complete by:  As directed      Diet - low sodium heart healthy    Complete by:  As directed      Increase activity slowly    Complete by:  As directed             Medication List    TAKE these medications        ALIGN 4 MG Caps  Take 4 mg by mouth 2 (two) times daily.     aspirin 81 MG chewable tablet  Chew 1 tablet (81 mg total) by mouth daily.     atorvastatin 80 MG tablet  Commonly known as:  LIPITOR  Take 1 tablet (80 mg total) by mouth daily at 6 PM.     lisinopril 5 MG tablet  Commonly known as:  PRINIVIL,ZESTRIL  Take 1 tablet (5 mg total) by mouth daily.     metoprolol tartrate 25 MG tablet  Commonly known as:  LOPRESSOR  Take 1 tablet (25 mg total) by mouth 2 (two) times daily.     nitroGLYCERIN 0.4 MG SL tablet  Commonly known as:  NITROSTAT  Place 1 tablet (0.4 mg total) under the tongue every 5 (five) minutes x 3 doses as needed for chest pain.     ticagrelor 90 MG Tabs tablet  Commonly known as:  BRILINTA  Take 1 tablet (90 mg total) by mouth 2 (two) times daily.     ticagrelor 90 MG Tabs tablet  Commonly known as:  BRILINTA  Take 1 tablet (90 mg total) by mouth 2 (two) times daily.           Follow-up Information    Follow up with Truitt Merle, NP On 04/17/2014.   Specialty:  Nurse Practitioner   Why:  Dr. Hassell Done NP @ 9:00 am    Contact information:     [address] [place] [address] [phone]       Follow up with Jettie Booze., MD On 05/12/2014.   Specialty:  Interventional Cardiology   Why:  11:30 am    Contact information:   [address] [address] [phone]       TIME SPENT ON DISCHARGE, INCLUDING PHYSICIAN TIME: >30 MINUTES   Signed: Lyda Jester 04/09/2014, 9:22 AM

## 2014-04-09 NOTE — Progress Notes (Signed)
Patient was discharged per MD order. AVS (patient discharge summary) was given to patient. Required education, follow-up appointments and new medications and where to fill each, were gone over with patient. All hospital equipment and invasive lines were removed. Patient left the hospital via wheelchair escorted by hospital transportation with spouse and belongings in hand.

## 2014-04-10 NOTE — Telephone Encounter (Signed)
Patient contacted regarding discharge from [facility] on 04/09/14.  Patient understands to follow up with provider Kathrene Alu on 04/17/14  at [place] [facility]. Patient understands discharge instructions? yes Patient understands medications and regiment? yes Patient understands to bring all medications to this visit? yes

## 2014-04-17 ENCOUNTER — Encounter: Payer: Self-pay | Admitting: Nurse Practitioner

## 2014-04-17 ENCOUNTER — Ambulatory Visit (INDEPENDENT_AMBULATORY_CARE_PROVIDER_SITE_OTHER): Payer: BLUE CROSS/BLUE SHIELD | Admitting: Nurse Practitioner

## 2014-04-17 VITALS — BP 124/82 | HR 60 | Ht 65.0 in | Wt 222.8 lb

## 2014-04-17 DIAGNOSIS — I213 ST elevation (STEMI) myocardial infarction of unspecified site: Secondary | ICD-10-CM | POA: Diagnosis not present

## 2014-04-17 DIAGNOSIS — R0789 Other chest pain: Secondary | ICD-10-CM

## 2014-04-17 LAB — HEPATIC FUNCTION PANEL
ALT: 22 U/L (ref 0–35)
AST: 17 U/L (ref 0–37)
Albumin: 4.2 g/dL (ref 3.5–5.2)
Alkaline Phosphatase: 109 U/L (ref 39–117)
Bilirubin, Direct: 0.2 mg/dL (ref 0.0–0.3)
Total Bilirubin: 0.5 mg/dL (ref 0.2–1.2)
Total Protein: 7.4 g/dL (ref 6.0–8.3)

## 2014-04-17 MED ORDER — ISOSORBIDE MONONITRATE ER 30 MG PO TB24: 15.0000 mg | ORAL_TABLET | Freq: Every day | ORAL | Status: DC

## 2014-04-17 MED ORDER — PRASUGREL HCL 10 MG PO TABS: 10.0000 mg | ORAL_TABLET | Freq: Every day | ORAL | Status: DC

## 2014-04-17 NOTE — Progress Notes (Signed)
CARDIOLOGY OFFICE NOTE  Date:  04/17/2014    Sheri [name] Date of Birth: 12/07/1965 Medical Record #[id number]  PCP:  No primary care provider on file.  Cardiologist:  Irish Lack    Chief Complaint  Patient presents with  . Post STEMI    Post hospital/TOC - seen for Dr. Irish Lack    History of Present Illness: Sheri [name] is a 49 y.o. female who presents today for a post hospital/TOC visit. Seen for Dr. Irish Lack. She has a past medical history of hypertension, however no past cardiac history. She does have significant family history of early CAD, notably her father died of MI at age 68. She denies any smoking history, drinking history or any illicit drug use.   She started having severe substernal chest pressure around 9:30 AM on 04/06/2014. She subsequently called EMS and was transported to [facility]. EKG showed ST elevation in the inferior lead with corresponding ST depression in anterior lead. Troponin peaked at 30. She was transferred emergently to [facility]. In route, she was given IV heparin and Zofran. On arrival, she continued to complain of 5 out of 10 chest pressure. The patient was taken emergently to the cath lab for cardiac catheterization. The procedure was performed by Dr. Irish Lack. Access was obtained via the right radial artery. She was found to have an occluded mid right coronary artery which was the culprit vessel, successfully treated with PCI + DES. 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 There was a 70% lesion proximally as the vessel bends around to the lateral wall. Medical therapy was elected. She had a normal left main and only mild diease in the LAD and its branches. LVF was normal with EF of 55%. She tolerated the procedure well and left the cath lab in stable condition. Her chest pain resolved. Troponin levels trended downward. She was placed on DAPT with ASA + Brilinta, as well as high dose statin therapy with Lipitor (LDL  elevated at 121 mg/dL), BB therapy with metoprolol and ACE-I therapy with lisinopril. Post procedure, she had NSVT, requiring IV amiodarone. This ultimately resolved and IV amiodarone was discontinued. She was monitored on telemetry and maintained NSR on BB therapy. She had no difficulties ambulating with cardiac rehab. Vitals signs remained stable. Her right radial access site remained stable. She was last seen and examined by Dr. Marlou Porch who determined she was stable for discharge home. Post hospital f/u has been scheduled with Truitt Merle, NP, on 04/17/14. F/U with Dr. Irish Lack has been scheduled for 05/12/14. She was given a free 30 day Brilinta Card and PRN SL NTG. Repeat FLP and LFTs will need to be rechecked in 4-6 weeks.   Comes back today. Here with her husband. She has been home a little over a week. She is quite emotional - lots of crying jags. She has had chest pain - nothing like what took her to the hospital - very similar - lasting for just seconds at a time. Very fatigued. "Wierd" sweats that come and go. No NTG use. She feels like this cascade of symptoms is improving and not getting worse. She is breathless. Notes that she was leaning over her kitchen sink just to peel shrimp and was short of breath. Not dizzy. Some flutters at night. She is quite scared.   Past Medical History  Diagnosis Date  . Hypertension     Past Surgical History  Procedure Laterality Date  . Cesarean section    .  Left heart catheterization with coronary angiogram N/A 04/06/2014    Procedure: LEFT HEART CATHETERIZATION WITH CORONARY ANGIOGRAM;  Surgeon: Jettie Booze, MD;  Location: [facility] CATH LAB;  Service: Cardiovascular;  Laterality: N/A;     Medications: Current Outpatient Prescriptions  Medication Sig Dispense Refill  . aspirin 81 MG chewable tablet Chew 1 tablet (81 mg total) by mouth daily.    Marland Kitchen atorvastatin (LIPITOR) 80 MG tablet Take 1 tablet (80 mg total) by mouth daily at 6 PM. 30 tablet 5  .  lisinopril (PRINIVIL,ZESTRIL) 5 MG tablet Take 1 tablet (5 mg total) by mouth daily. 30 tablet 5  . metoprolol tartrate (LOPRESSOR) 25 MG tablet Take 1 tablet (25 mg total) by mouth 2 (two) times daily. 60 tablet 5  . nitroGLYCERIN (NITROSTAT) 0.4 MG SL tablet Place 1 tablet (0.4 mg total) under the tongue every 5 (five) minutes x 3 doses as needed for chest pain. 25 tablet 2  . Probiotic Product (ALIGN) 4 MG CAPS Take 4 mg by mouth 2 (two) times daily.    . isosorbide mononitrate (IMDUR) 30 MG 24 hr tablet Take 0.5 tablets (15 mg total) by mouth daily. 30 tablet 3  . prasugrel (EFFIENT) 10 MG TABS tablet Take 1 tablet (10 mg total) by mouth daily. 90 tablet 3   No current facility-administered medications for this visit.    Allergies: No Known Allergies  Social History: The patient  reports that she has quit smoking. She does not have any smokeless tobacco history on file. She reports that she does not drink alcohol or use illicit drugs.   Family History: The patient's family history includes Heart attack (age of onset: 6) in her father; Hyperlipidemia in her mother; Hypertension in her mother.   Review of Systems: Please see the history of present illness.   Otherwise, the review of systems is positive for depression, dyspnea, sweating, skipped heart beats, irregular heart beats and anxiety.   All other systems are reviewed and negative.   Physical Exam: VS:  BP 124/82 mmHg  Pulse 60  Ht $R'5\' 5"'jq$  (1.651 m)  Wt 222 lb 12.8 oz (101.061 kg)  BMI 37.08 kg/m2 .  BMI Body mass index is 37.08 kg/(m^2).  Wt Readings from Last 3 Encounters:  04/17/14 222 lb 12.8 oz (101.061 kg)  04/09/14 220 lb 7.4 oz (100 kg)    General: She is quite tearful. She is in no acute distress.  HEENT: Normal. Neck: Supple, no JVD, carotid bruits, or masses noted.  Cardiac: Regular rate and rhythm. No murmurs, rubs, or gallops. No edema.  Respiratory:  Lungs are clear to auscultation bilaterally with normal  work of breathing.  GI: Soft and nontender.  MS: No deformity or atrophy. Gait and ROM intact. Skin: Warm and dry. Color is normal. Cath site right wrist looks fine.  Neuro:  Strength and sensation are intact and no gross focal deficits noted.  Psych: Alert, appropriate and with normal affect.   LABORATORY DATA:  EKG:  EKG is ordered today. This demonstrates NSR - evolving inferior changes - looks improved.   Lab Results  Component Value Date   WBC 10.3 04/07/2014   HGB 12.3 04/07/2014   HCT 36.1 04/07/2014   PLT 260 04/07/2014   GLUCOSE 124* 04/07/2014   CHOL 179 04/07/2014   TRIG 125 04/07/2014   HDL 33* 04/07/2014   LDLCALC 121* 04/07/2014   ALT 42* 04/06/2014   AST 73* 04/06/2014   NA 136 04/07/2014   K  3.7 04/07/2014   CL 102 04/07/2014   CREATININE 0.67 04/07/2014   BUN 12 04/07/2014   CO2 26 04/07/2014   TSH 2.145 04/06/2014   HGBA1C 5.6 04/06/2014    BNP (last 3 results) No results for input(s): BNP in the last 8760 hours.  ProBNP (last 3 results) No results for input(s): PROBNP in the last 8760 hours.   Other Studies Reviewed Today:   PROCEDURE: Left heart catheterization with selective coronary angiography, left ventriculogram. Aspiration thrombectomy of the RCA. PCI of the RCA.  INDICATIONS: Acute inferolateral ST elevation MI  The risks, benefits, and details of the procedure were explained to the patient. The patient verbalized understanding and wanted to proceed. Emergency consent was obtained.  PROCEDURE TECHNIQUE: After Xylocaine anesthesia a 20F slender sheath was placed in the right radial artery with a single anterior needle wall stick. IV Heparin was given. Right coronary angiography was done using a Judkins R4 guide catheter. Left coronary angiography was done using a Judkins L3.5 guide catheter. Left ventriculography was done using a pigtail catheter. After the intervention, We tried a multipurpose catheter, JR4, and AR-1 to try to  engage the anomalous circumflex coming off the right cusp. A TR band was used for hemostasis.  CONTRAST: Total of 165 cc.  COMPLICATIONS: None.   HEMODYNAMICS: Aortic pressure was 108/72; LV pressure was 105/11; LVEDP 18. There was no gradient between the left ventricle and aorta.   ANGIOGRAPHIC DATA: The left main coronary artery is absent. The patient has separate ostia of her LAD and circumflex.  The left anterior descending artery is a large vessel which wraps around the apex. In the mid vessel, there is mild disease. There are 3 large diagonals which appear patent.  The left circumflex artery is a large vessel which has an anomalous takeoff, from the right cusp. There does appear to be a 70% lesion proximally. There are 2 large obtuse marginal branches which appear patent. It was difficult to engage this vessel selectively.  The right coronary artery is occluded in the midportion. After intervention, it was noted that the vessel was large and dominant. There is a large posterior lateral artery and a large posterior descending artery which is widely patent.  LEFT VENTRICULOGRAM: Left ventricular angiogram was done in the 30 RAO projection and revealed basal inferior hypokinesis and normal systolic function with an estimated ejection fraction of 55 %. LVEDP was 18 mmHg.  PCI NARRATIVE: Angiomax, Brilinta and Cangrelor were used for anticoagulation. ACT was used to check that the anticoagulation was therapeutic. A JR4 guiding catheter was used to engage the RCA. A pro-water wire was placed down the RCA across the area of occlusion in the mid RCA. A fetch catheter was advanced and aspiration thrombectomy was performed. TIMI-3 flow was restored after the fetch catheter. At this point, her heart rate slowed down significantly. We are ready to give atropine but her heart rate picked up on her own. During the case, she had short bursts of nonsustained ventricular tachycardia. This  settled down by the end of the case. A large thrombus burden was removed with the fetch catheter. A second pass was made with the fetch catheter as well. A 2.0 x 15 balloon was used to predilate. A 3.0 x 24 Synergy drug-eluting stent was then used to treat the lesion. The stent was postdilated with a 3.25 x 20 noncompliant balloon, inflated to 18 atm. There was an excellent angiographic result. Intracoronary nitroglycerin was given.  IMPRESSIONS:  1. Normal  left main coronary artery. 2. Mild disease in the left anterior descending artery and its branches. 3. 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 There is a 70% lesion proximally as the vessel bends around to the lateral wall. It was difficult to selectively engage this vessel. The AR-1 catheter gave [place] the best visualization. Likely, if intervention was needed in the future, a JR4 guide could be used and hopefully, a wire could be placed into the circumflex to help draw in the guide.  4. 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 This was successfully treated with a 3.0 x 24 Synergy drug-eluting stent, postdilated to 3.3 mm in diameter. 5. Overall normal left ventricular systolic function. LVEDP 18 mmHg. Ejection fraction 55%.  RECOMMENDATION: Continue dual antiplatelet therapy for at least a year. She'll need aggressive secondary prevention. Would base further evaluation of her circumflex lesion on her symptoms. She'll be watched in the ICU. Will continue her low-dose Angiomax until the bag runs out. Start high-dose statin and low-dose beta blocker.         Assessment/Plan: 1. Inferior STEMI - treated with DES to RCA  2. 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 HLD - now on statin therapy. LFTs were up - presumed from her MI - recheck today.   4. Chest pain/sweats/dsypnea - will add low dose Imdur - cautioned about headache. Switch Brilinta to Effient. Explained that  if symptoms persist - would proceed on with attempts at PCI to the LCX. She is quite emotional - this may be due to her beta blocker. Will get her back early next week for recheck. Not ready to start cardiac rehab at this time.   Current medicines are reviewed with the patient today.  The patient does not have concerns regarding medicines other than what has been noted above.  The following changes have been made:  See above.  Labs/ tests ordered today include:    Orders Placed This Encounter  Procedures  . Hepatic function panel  . EKG 12-Lead   Disposition:   FU with Dr. Irish Lack early next week for recheck.   Patient is agreeable to this plan and will call if any problems develop in the interim.   Signed: Burtis Junes, RN, ANP-C 04/17/2014 10:17 AM  [place] [address] [place], [place]  [address] Phone: [phone] Fax: [phone]

## 2014-04-17 NOTE — Patient Instructions (Addendum)
We will be checking the following labs today BMET, CBC  Stay on your current medicines but I am changing your Brilinta to Effient 10 mg to take just once a day - will give you samples  I am adding Imdur 30 mg to take just a half (15mg ) each day - this is to help with your chest pain - it may give you a headache - ok to use Tylenol  See me or Dr. Irish Lack early next week  Call the [place] office at [phone] if you have any questions, problems or concerns.

## 2014-04-20 ENCOUNTER — Ambulatory Visit (INDEPENDENT_AMBULATORY_CARE_PROVIDER_SITE_OTHER): Payer: BLUE CROSS/BLUE SHIELD | Admitting: Interventional Cardiology

## 2014-04-20 ENCOUNTER — Encounter: Payer: Self-pay | Admitting: Interventional Cardiology

## 2014-04-20 VITALS — BP 110/80 | HR 73 | Ht 65.0 in | Wt 224.0 lb

## 2014-04-20 DIAGNOSIS — I2119 ST elevation (STEMI) myocardial infarction involving other coronary artery of inferior wall: Secondary | ICD-10-CM

## 2014-04-20 DIAGNOSIS — R0789 Other chest pain: Secondary | ICD-10-CM

## 2014-04-20 DIAGNOSIS — E785 Hyperlipidemia, unspecified: Secondary | ICD-10-CM

## 2014-04-20 DIAGNOSIS — I2510 Atherosclerotic heart disease of native coronary artery without angina pectoris: Secondary | ICD-10-CM

## 2014-04-20 NOTE — Progress Notes (Signed)
Patient ID: Sheri [name], female   DOB: [birth year], 49 y.o.   MRN: [id number]     Cardiology Office Note   Date:  04/21/2014   ID:  Sheri [name], DOB [birth year], MRN [id number]  PCP:  No primary care provider on file.  Cardiologist:   Jettie Booze., MD   Chief Complaint  Patient presents with  . 1 WK F/U VISIT      History of Present Illness: Sheri [name] is a 49 y.o. female who had an inferior MI in 3/16.  She had a stent to the RCA.  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  At the time of her MI, she had excessive sweatiness and nausea along with tooth pain.  She was lightheaded and felt that she would pass out.  She got home and had left arm, left jaw and chest pressure.  She knew it was an MI and came to the hospital.   She has had some CP required NTG.  She had an episode yesterday and took a NTG.  After 20 minutes, the pain went away.  No jaw and tooth pain at that time.    She went shopping yesterday.  She walked a little and had some shoulder pain after about 1.5-2 hours of shopping. She feels that she is more emotional, but this is improving.   Overall, she feels better since last week. She thinks the Imdur has helped. She would like to see if the improvement continues. If she continues to have symptoms, we would have to look at possible intervention of the anomalous circumflex.    Past Medical History  Diagnosis Date  . Hypertension     Past Surgical History  Procedure Laterality Date  . Cesarean section    . Left heart catheterization with coronary angiogram N/A 04/06/2014    Procedure: LEFT HEART CATHETERIZATION WITH CORONARY ANGIOGRAM;  Surgeon: Jettie Booze, MD;  Location: [facility] CATH LAB;  Service: Cardiovascular;  Laterality: N/A;     Current Outpatient Prescriptions  Medication Sig Dispense Refill  . aspirin 81 MG chewable tablet Chew 1 tablet (81 mg total) by mouth daily.    Marland Kitchen atorvastatin (LIPITOR) 80 MG tablet Take 1 tablet (80 mg  total) by mouth daily at 6 PM. 30 tablet 5  . isosorbide mononitrate (IMDUR) 30 MG 24 hr tablet Take 0.5 tablets (15 mg total) by mouth daily. 30 tablet 3  . lisinopril (PRINIVIL,ZESTRIL) 5 MG tablet Take 1 tablet (5 mg total) by mouth daily. 30 tablet 5  . metoprolol tartrate (LOPRESSOR) 25 MG tablet Take 1 tablet (25 mg total) by mouth 2 (two) times daily. 60 tablet 5  . prasugrel (EFFIENT) 10 MG TABS tablet Take 1 tablet (10 mg total) by mouth daily. 90 tablet 3  . Probiotic Product (ALIGN) 4 MG CAPS Take 4 mg by mouth 2 (two) times daily.    . nitroGLYCERIN (NITROSTAT) 0.4 MG SL tablet Place 1 tablet (0.4 mg total) under the tongue every 5 (five) minutes x 3 doses as needed for chest pain. (Patient not taking: Reported on 04/20/2014) 25 tablet 2   No current facility-administered medications for this visit.    Allergies:   Review of patient's allergies indicates no known allergies.    Social History:  The patient  reports that she has quit smoking. She does not have any smokeless tobacco history on file. She reports that she does not drink alcohol or use illicit drugs.   Family History:  The patient's family history includes Heart attack (age of onset: 46) in her father; Hyperlipidemia in her mother; Hypertension in her mother.    ROS:  Please see the history of present illness.   Otherwise, review of systems are positive for chest discomfort as noted above. She has also had some anxiety.   All other systems are reviewed and negative.    PHYSICAL EXAM: VS:  BP 110/80 mmHg  Pulse 73  Ht 5\' 5"  (1.651 m)  Wt 224 lb (101.606 kg)  BMI 37.28 kg/m2 , BMI Body mass index is 37.28 kg/(m^2). GEN: Well nourished, well developed, in no acute distress HEENT: normal Neck: no JVD, carotid bruits, or masses Cardiac:RRR; no murmurs, rubs, or gallops,no edema  Respiratory:  clear to auscultation bilaterally, normal work of breathing GI: soft, nontender, nondistended, + BS MS: no deformity or  atrophy Skin: warm and dry, no rash; no right wrist hematoma or bruising Neuro:  Strength and sensation are intact Psych: euthymic mood, full affect   EKG:  EKG is not ordered today.    Recent Labs: 04/06/2014: TSH 2.145 04/07/2014: BUN 12; Creatinine 0.67; Hemoglobin 12.3; Platelets 260; Potassium 3.7; Sodium 136 04/17/2014: ALT 22    Lipid Panel    Component Value Date/Time   CHOL 179 04/07/2014 0337   TRIG 125 04/07/2014 0337   HDL 33* 04/07/2014 0337   CHOLHDL 5.4 04/07/2014 0337   VLDL 25 04/07/2014 0337   LDLCALC 121* 04/07/2014 0337      Wt Readings from Last 3 Encounters:  04/20/14 224 lb (101.606 kg)  04/17/14 222 lb 12.8 oz (101.061 kg)  04/09/14 220 lb 7.4 oz (100 kg)      Other studies Reviewed: Additional studies/ records that were reviewed today include: Cath report. Review of the above records demonstrates: Moderate lesion in the anomalous circumflex. Difficult to directly engage this vessel.   ASSESSMENT AND PLAN:  1.  Coronary artery disease/recent inferior wall MI: Continue dual antiplatelet therapy. Continue antianginal therapy. If her symptoms persist, we'll plan for another cardiac catheterization, likely from the right groin approach as this may make it easier to try and engage the circumflex.  I think it is also normal that she has been somewhat emotional since the MI. We talked about the risk of depression.  Hyperlipidemia: Continue atorvastatin. LDL well above target in early March.   Current medicines are reviewed at length with the patient today.  The patient has concerns regarding medicines.  The following changes have been made:  no change  Labs/ tests ordered today include: Echocardiogram for mid April.   Orders Placed This Encounter  Procedures  . 2D Echocardiogram without contrast     Disposition:   FU with me in 2 months   Teresita Madura., MD  04/21/2014 11:29 AM    [facility] [place], [place], [place]  [address] Phone: [phone]; Fax: [phone]

## 2014-04-20 NOTE — Patient Instructions (Signed)
Your physician recommends that you continue on your current medications as directed. Please refer to the Current Medication list given to you today.  Your physician has requested that you have an echocardiogram in mid April. Echocardiography is a painless test that uses sound waves to create images of your heart. It provides your doctor with information about the size and shape of your heart and how well your heart's chambers and valves are working. This procedure takes approximately one hour. There are no restrictions for this procedure.  Your physician recommends that you schedule a follow-up appointment in: 2 months with Dr. Irish Lack.

## 2014-04-21 DIAGNOSIS — I2510 Atherosclerotic heart disease of native coronary artery without angina pectoris: Secondary | ICD-10-CM

## 2014-04-21 DIAGNOSIS — E785 Hyperlipidemia, unspecified: Secondary | ICD-10-CM | POA: Insufficient documentation

## 2014-04-21 DIAGNOSIS — I25118 Atherosclerotic heart disease of native coronary artery with other forms of angina pectoris: Secondary | ICD-10-CM | POA: Insufficient documentation

## 2014-04-22 ENCOUNTER — Other Ambulatory Visit (INDEPENDENT_AMBULATORY_CARE_PROVIDER_SITE_OTHER): Payer: BLUE CROSS/BLUE SHIELD | Admitting: *Deleted

## 2014-04-22 DIAGNOSIS — E785 Hyperlipidemia, unspecified: Secondary | ICD-10-CM

## 2014-04-22 DIAGNOSIS — I2119 ST elevation (STEMI) myocardial infarction involving other coronary artery of inferior wall: Secondary | ICD-10-CM

## 2014-04-22 DIAGNOSIS — I472 Ventricular tachycardia: Secondary | ICD-10-CM | POA: Diagnosis not present

## 2014-04-22 LAB — CBC WITH DIFFERENTIAL/PLATELET
Basophils Absolute: 0 10*3/uL (ref 0.0–0.1)
Basophils Relative: 0.4 % (ref 0.0–3.0)
Eosinophils Absolute: 0.2 10*3/uL (ref 0.0–0.7)
Eosinophils Relative: 1.8 % (ref 0.0–5.0)
HCT: 38.3 % (ref 36.0–46.0)
Hemoglobin: 13 g/dL (ref 12.0–15.0)
Lymphocytes Relative: 28.2 % (ref 12.0–46.0)
Lymphs Abs: 2.6 10*3/uL (ref 0.7–4.0)
MCHC: 33.9 g/dL (ref 30.0–36.0)
MCV: 84.3 fl (ref 78.0–100.0)
Monocytes Absolute: 0.4 10*3/uL (ref 0.1–1.0)
Monocytes Relative: 4.2 % (ref 3.0–12.0)
Neutro Abs: 6 10*3/uL (ref 1.4–7.7)
Neutrophils Relative %: 65.4 % (ref 43.0–77.0)
Platelets: 294 10*3/uL (ref 150.0–400.0)
RBC: 4.55 Mil/uL (ref 3.87–5.11)
RDW: 13.3 % (ref 11.5–15.5)
WBC: 9.3 10*3/uL (ref 4.0–10.5)

## 2014-04-22 LAB — BASIC METABOLIC PANEL
BUN: 19 mg/dL (ref 6–23)
CO2: 30 mEq/L (ref 19–32)
Calcium: 9.2 mg/dL (ref 8.4–10.5)
Chloride: 103 mEq/L (ref 96–112)
Creatinine, Ser: 0.71 mg/dL (ref 0.40–1.20)
GFR: 92.66 mL/min (ref >60.00)
Glucose, Bld: 101 mg/dL — ABNORMAL HIGH (ref 70–99)
Potassium: 4.4 mEq/L (ref 3.5–5.1)
Sodium: 137 mEq/L (ref 135–145)

## 2014-04-22 NOTE — Addendum Note (Signed)
Addended by: Eulis Foster on: 04/22/2014 10:30 AM   Modules accepted: Orders

## 2014-05-04 ENCOUNTER — Other Ambulatory Visit: Payer: Self-pay | Admitting: *Deleted

## 2014-05-04 MED ORDER — ISOSORBIDE MONONITRATE ER 30 MG PO TB24: 15.0000 mg | ORAL_TABLET | Freq: Every day | ORAL | Status: DC

## 2014-05-04 MED ORDER — ATORVASTATIN CALCIUM 80 MG PO TABS: 80.0000 mg | ORAL_TABLET | Freq: Every day | ORAL | Status: DC

## 2014-05-04 MED ORDER — LISINOPRIL 5 MG PO TABS: 5.0000 mg | ORAL_TABLET | Freq: Every day | ORAL | Status: DC

## 2014-05-04 MED ORDER — METOPROLOL TARTRATE 25 MG PO TABS: 25.0000 mg | ORAL_TABLET | Freq: Two times a day (BID) | ORAL | Status: DC

## 2014-05-05 ENCOUNTER — Telehealth: Payer: Self-pay | Admitting: Interventional Cardiology

## 2014-05-05 ENCOUNTER — Other Ambulatory Visit: Payer: Self-pay

## 2014-05-05 MED ORDER — METOPROLOL TARTRATE 25 MG PO TABS: 25.0000 mg | ORAL_TABLET | Freq: Two times a day (BID) | ORAL | Status: DC

## 2014-05-05 MED ORDER — ATORVASTATIN CALCIUM 80 MG PO TABS: 80.0000 mg | ORAL_TABLET | Freq: Every day | ORAL | Status: DC

## 2014-05-05 NOTE — Telephone Encounter (Signed)
Pt states that Saturday she [place] and when she went to the restroom noticed a light amount of blood after urinating. Pt states this occurred again Sunday and Monday after pt had been walking around and doing physical activity. Pt states that she has not had this occur at all today but that she has been "taking it easy" today. Pt states that she has had no other symptoms and no pain anywhere. Pt states that she was afraid this may be occuring because of her Effient. Informed pt that Dr. Irish Lack is out of the office but that I would route this information to him for review. Also informed pt that she should follow up with her PCP in regards to bleeding as well. Pt verbalized understanding and was in agreement with this plan.

## 2014-05-05 NOTE — Telephone Encounter (Signed)
New message     Pt c/o medication issue:  1. Name of Medication: effient 2. How are you currently taking this medication (dosage and times per day)? 10mg  daily 3. Are you having a reaction (difficulty breathing--STAT)?   4. What is your medication issue? Blood in urine Saturday-Monday.  Could this be from the medication?

## 2014-05-05 NOTE — Telephone Encounter (Signed)
Rx(s) sent to pharmacy electronically.  

## 2014-05-06 NOTE — Telephone Encounter (Signed)
Pt calling back to add that now she is having pain in her left side-and her breath catches when she breathes in

## 2014-05-06 NOTE — Telephone Encounter (Signed)
Informed pt of Dr. Hassell Done suggestion to go to PMD for UA. Pt verbalized understanding and states that she will go to Urgent Care in the morning.

## 2014-05-06 NOTE — Telephone Encounter (Signed)
Could be related to Effient  Or possibly a UTI, kidney stone?.there are several possibilities.  She should have U/A with her PMD.

## 2014-05-07 ENCOUNTER — Inpatient Hospital Stay
Admit: 2014-05-07 | Disposition: A | Discharge status: Home / Self Care | Payer: Self-pay | Attending: Internal Medicine | Admitting: Internal Medicine

## 2014-05-07 ENCOUNTER — Telehealth: Payer: Self-pay | Admitting: Interventional Cardiology

## 2014-05-07 DIAGNOSIS — I2510 Atherosclerotic heart disease of native coronary artery without angina pectoris: Secondary | ICD-10-CM

## 2014-05-07 DIAGNOSIS — R079 Chest pain, unspecified: Secondary | ICD-10-CM | POA: Diagnosis not present

## 2014-05-07 LAB — D-DIMER(ARMC): D-DIMER: 242 ng/mL

## 2014-05-07 LAB — COMPREHENSIVE METABOLIC PANEL
ALK PHOS: 97 U/L
Albumin: 3.9 g/dL
Anion Gap: 9 (ref 7–16)
BILIRUBIN TOTAL: 0.6 mg/dL
BUN: 17 mg/dL
CO2: 25 mmol/L
Calcium, Total: 9.2 mg/dL
Chloride: 105 mmol/L
Creatinine: 0.66 mg/dL
EGFR (Non-African Amer.): >60
Glucose: 106 mg/dL — ABNORMAL HIGH
Potassium: 4.3 mmol/L
SGOT(AST): 70 U/L — ABNORMAL HIGH
SGPT (ALT): 51 U/L
SODIUM: 139 mmol/L
Total Protein: 7 g/dL

## 2014-05-07 LAB — CBC
HCT: 38.6 % (ref 35.0–47.0)
HGB: 12.9 g/dL (ref 12.0–16.0)
MCH: 28.4 pg (ref 26.0–34.0)
MCHC: 33.6 g/dL (ref 32.0–36.0)
MCV: 85 fL (ref 80–100)
PLATELETS: 245 10*3/uL (ref 150–440)
RBC: 4.56 10*6/uL (ref 3.80–5.20)
RDW: 13.6 % (ref 11.5–14.5)
WBC: 8.4 10*3/uL (ref 3.6–11.0)

## 2014-05-07 LAB — URINALYSIS, COMPLETE
BILIRUBIN, UR: NEGATIVE
Glucose,UR: NEGATIVE mg/dL (ref 0–75)
KETONE: NEGATIVE
Leukocyte Esterase: NEGATIVE
Nitrite: NEGATIVE
Ph: 7 (ref 4.5–8.0)
Protein: 30
Specific Gravity: 1.01 (ref 1.003–1.030)

## 2014-05-07 LAB — CK TOTAL AND CKMB (NOT AT ARMC)
CK, TOTAL: 1713 U/L — AB
CK, Total: 2034 U/L — ABNORMAL HIGH
CK, Total: 1488 U/L — ABNORMAL HIGH
CK-MB: 5 ng/mL
CK-MB: 4 ng/mL
CK-MB: 3.5 ng/mL

## 2014-05-07 LAB — TROPONIN I
Troponin-I: <0.03 ng/mL
Troponin-I: <0.03 ng/mL
Troponin-I: <0.03 ng/mL

## 2014-05-07 NOTE — Telephone Encounter (Signed)
New Message  Pt came to the ER in [facility]. Should they go to [facility]. Transferred call to nurse

## 2014-05-07 NOTE — Telephone Encounter (Signed)
Spoke with pt's husband. He inquired about whether or not pt should stay at [facility] or should he have her transferred to [facility]. Husband states that they plan to keep pt overnight for observation. Informed husband that it was fine for pt to stay at [facility].  Husband wanted to know about records being seen. Informed husband to have hospital fax records over. Husband verbalized understanding and was in agreement with this plan.

## 2014-05-08 DIAGNOSIS — R079 Chest pain, unspecified: Secondary | ICD-10-CM | POA: Diagnosis not present

## 2014-05-08 LAB — BASIC METABOLIC PANEL
Anion Gap: 6 — ABNORMAL LOW (ref 7–16)
BUN: 14 mg/dL
Calcium, Total: 9.1 mg/dL
Chloride: 103 mmol/L
Co2: 30 mmol/L
Creatinine: 0.65 mg/dL
EGFR (Non-African Amer.): >60
GLUCOSE: 124 mg/dL — AB
Potassium: 3.7 mmol/L
SODIUM: 139 mmol/L

## 2014-05-12 ENCOUNTER — Encounter: Payer: Self-pay | Admitting: Interventional Cardiology

## 2014-05-12 ENCOUNTER — Ambulatory Visit (HOSPITAL_COMMUNITY): Payer: BLUE CROSS/BLUE SHIELD | Attending: Cardiology

## 2014-05-12 ENCOUNTER — Ambulatory Visit (INDEPENDENT_AMBULATORY_CARE_PROVIDER_SITE_OTHER): Payer: BLUE CROSS/BLUE SHIELD | Admitting: Interventional Cardiology

## 2014-05-12 VITALS — BP 112/78 | HR 66 | Ht 65.0 in | Wt 228.8 lb

## 2014-05-12 DIAGNOSIS — I2119 ST elevation (STEMI) myocardial infarction involving other coronary artery of inferior wall: Secondary | ICD-10-CM

## 2014-05-12 DIAGNOSIS — I2510 Atherosclerotic heart disease of native coronary artery without angina pectoris: Secondary | ICD-10-CM | POA: Diagnosis not present

## 2014-05-12 DIAGNOSIS — R0602 Shortness of breath: Secondary | ICD-10-CM

## 2014-05-12 DIAGNOSIS — E785 Hyperlipidemia, unspecified: Secondary | ICD-10-CM

## 2014-05-12 DIAGNOSIS — R748 Abnormal levels of other serum enzymes: Secondary | ICD-10-CM

## 2014-05-12 DIAGNOSIS — R0789 Other chest pain: Secondary | ICD-10-CM

## 2014-05-12 LAB — BRAIN NATRIURETIC PEPTIDE: Pro B Natriuretic peptide (BNP): 61 pg/mL (ref 0.0–100.0)

## 2014-05-12 NOTE — Progress Notes (Signed)
2D Echo completed. 05/12/2014

## 2014-05-12 NOTE — Progress Notes (Signed)
Patient ID: Sheri [name], female   DOB: 1965-09-29, 49 y.o.   MRN: [id number]     Cardiology Office Note   Date:  05/12/2014   ID:  Sheri [name], DOB [birth year], MRN [id number]  PCP:  No primary care provider on file.  Cardiologist:   Jettie Booze., MD   No chief complaint on file.  CAD, chest discomfort    History of Present Illness: Sheri [name] is a 49 y.o. female who had an inferior MI in 3/16.  She had a stent to the RCA.  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  At the time of her MI, she had excessive sweatiness and nausea along with tooth pain.  She was lightheaded and felt that she would pass out.  She got home and had left arm, left jaw and chest pressure.  She knew it was an MI and came to the hospital.   She visited the emergency room at [facility] on April 7.  At that time, she had been having some blood in her urine along with some back pain and shortness of breath. She described pain in her right side of her back. She was in the emergency room and had a negative troponin. They did find that her total CK was elevated at 1488.  She had a negative chest x-ray. They were not concerned about ischemia. They decreased her atorvastatin from 80 mg down to 40 mg. Overall, she still does not feel well although she can't give specifics as to what the problem is.   While at [facility], she had an episode where she felt like she was close to passing out. She had brief episode of chest discomfort but it spontaneously resolved. She has been short of breath with walking any distance including walking from her car into our office today. She feels that her hands are swollen. She has not had pain in her chest that reminds her of her prior MI.  She admits to being anxious regarding any slight symptom that could be related to her heart since her MI.  Overall, she feels better since last week. She thinks the Imdur has helped. She would like to see if the improvement continues. If she  continues to have symptoms, we would have to look at possible intervention of the anomalous circumflex.    Past Medical History  Diagnosis Date  . Hypertension     Past Surgical History  Procedure Laterality Date  . Cesarean section    . Left heart catheterization with coronary angiogram N/A 04/06/2014    Procedure: LEFT HEART CATHETERIZATION WITH CORONARY ANGIOGRAM;  Surgeon: Jettie Booze, MD;  Location: [facility] CATH LAB;  Service: Cardiovascular;  Laterality: N/A;     Current Outpatient Prescriptions  Medication Sig Dispense Refill  . aspirin 81 MG chewable tablet Chew 1 tablet (81 mg total) by mouth daily.    Marland Kitchen atorvastatin (LIPITOR) 40 MG tablet Take 40 mg by mouth daily.    . clopidogrel (PLAVIX) 75 MG tablet Take 75 mg by mouth daily.    . isosorbide mononitrate (IMDUR) 30 MG 24 hr tablet Take 0.5 tablets (15 mg total) by mouth daily. 45 tablet 3  . lisinopril (PRINIVIL,ZESTRIL) 5 MG tablet Take 1 tablet (5 mg total) by mouth daily. 90 tablet 3  . metoprolol tartrate (LOPRESSOR) 25 MG tablet Take 1 tablet (25 mg total) by mouth 2 (two) times daily. 180 tablet 3  . nitroGLYCERIN (NITROSTAT) 0.4 MG SL tablet Place 1 tablet (  0.4 mg total) under the tongue every 5 (five) minutes x 3 doses as needed for chest pain. 25 tablet 2  . Probiotic Product (ALIGN) 4 MG CAPS Take 4 mg by mouth 2 (two) times daily.     No current facility-administered medications for this visit.    Allergies:   Review of patient's allergies indicates no known allergies.    Social History:  The patient  reports that she has quit smoking. She does not have any smokeless tobacco history on file. She reports that she does not drink alcohol or use illicit drugs.   Family History:  The patient's family history includes Heart attack (age of onset: 63) in her father; Hyperlipidemia in her mother; Hypertension in her mother.    ROS:  Please see the history of present illness.   Otherwise, review of systems are  positive for chest discomfort as noted above. She has also had some anxiety.   All other systems are reviewed and negative.    PHYSICAL EXAM: VS:  BP 112/78 mmHg  Pulse 66  Ht 5\' 5"  (1.651 m)  Wt 228 lb 12.8 oz (103.783 kg)  BMI 38.07 kg/m2 , BMI Body mass index is 38.07 kg/(m^2). GEN: Well nourished, well developed, in no acute distress HEENT: normal Neck: no JVD, carotid bruits, or masses Cardiac:RRR; no murmurs, rubs, or gallops,no edema  Respiratory:  clear to auscultation bilaterally, normal work of breathing GI: soft, nontender, nondistended, + BS MS: no deformity or atrophy Skin: warm and dry, no rash; no right wrist hematoma or bruising Neuro:  Strength and sensation are intact Psych: euthymic mood, full affect   EKG:  EKG is not ordered today.    Recent Labs: 04/06/2014: TSH 2.145 04/17/2014: ALT 22 04/22/2014: BUN 19; Creatinine 0.71; Hemoglobin 13.0; Platelets 294.0; Potassium 4.4; Sodium 137    Lipid Panel    Component Value Date/Time   CHOL 179 04/07/2014 0337   TRIG 125 04/07/2014 0337   HDL 33* 04/07/2014 0337   CHOLHDL 5.4 04/07/2014 0337   VLDL 25 04/07/2014 0337   LDLCALC 121* 04/07/2014 0337      Wt Readings from Last 3 Encounters:  05/12/14 228 lb 12.8 oz (103.783 kg)  04/20/14 224 lb (101.606 kg)  04/17/14 222 lb 12.8 oz (101.061 kg)      Other studies Reviewed: Additional studies/ records that were reviewed today include: Cath report.  Records from [place]. Review of the above records demonstrates: Moderate lesion in the anomalous circumflex. Difficult to directly engage this vessel. Echocardiogram showing normal left ventricular function without regional wall motion abnormality   ASSESSMENT AND PLAN:  1.  Coronary artery disease/recent inferior wall MI: Continue dual antiplatelet therapy. Plavix was substituted for Effient after her episode of hematuria. Continue antianginal therapy. If her symptoms persist, we'll plan for another cardiac  catheterization, likely from the right groin approach as this may make it easier to try and engage the circumflex.  We discussed whether she felt like she was ready for this today. She would still like to wait at this point to see if the medication changes will help.  I tried to reassure her and her husband that she has not suffered any permanent damage from her heart attack back in early March 2016. No regional wall motion abnormality is were noted.  I think it is also normal that she has been anxious since the MI.  Hyperlipidemia: Stop atorvastatin completely given the elevated CK. Will recheck CK in a couple of weeks. If  it is coming down nicely, could consider a different statin or may need to consider a different class of drugs. LDL well above target in early March.    Shortness of breath: We'll check BNP today. Of note, recent chest x-ray was reviewed and showed no evidence of fluid overload or infiltrate.   Current medicines are reviewed at length with the patient today.  The patient has concerns regarding medicines.  The following changes have been made:  no change  Labs/ tests ordered today include: Echocardiogram for mid April.   No orders of the defined types were placed in this encounter.     Disposition:   FU with Cecille Rubin in 6 weeks   Teresita Madura., MD  05/12/2014 12:03 PM    [facility] [address], [place], [place]  [address] Phone: [phone]; Fax: [phone]

## 2014-05-12 NOTE — Patient Instructions (Addendum)
Medication Instructions:  STOP LIPITOR  Labwork: TODAY BNP  2 WEEKS FOR BMET AND TOTAL CK  Testing/Procedures: NONE  Follow-Up: FOLLOW UP IN 6 WEEKS WITH DR. VARANASI OR Truitt Merle, NP   Any Other Special Instructions Will Be Listed Below (If Applicable).

## 2014-05-18 ENCOUNTER — Other Ambulatory Visit (HOSPITAL_COMMUNITY): Payer: BLUE CROSS/BLUE SHIELD

## 2014-05-26 ENCOUNTER — Other Ambulatory Visit (INDEPENDENT_AMBULATORY_CARE_PROVIDER_SITE_OTHER): Payer: BLUE CROSS/BLUE SHIELD | Admitting: *Deleted

## 2014-05-26 DIAGNOSIS — R0602 Shortness of breath: Secondary | ICD-10-CM

## 2014-05-26 DIAGNOSIS — I2119 ST elevation (STEMI) myocardial infarction involving other coronary artery of inferior wall: Secondary | ICD-10-CM

## 2014-05-26 LAB — BASIC METABOLIC PANEL
BUN: 22 mg/dL (ref 6–23)
CO2: 29 mEq/L (ref 19–32)
CREATININE: 0.63 mg/dL (ref 0.40–1.20)
Calcium: 9 mg/dL (ref 8.4–10.5)
Chloride: 102 mEq/L (ref 96–112)
GFR: 106.33 mL/min (ref >60.00)
GLUCOSE: 120 mg/dL — AB (ref 70–99)
POTASSIUM: 4.4 meq/L (ref 3.5–5.1)
Sodium: 136 mEq/L (ref 135–145)

## 2014-05-26 NOTE — Addendum Note (Signed)
Addended by: Eulis Foster on: 05/26/2014 09:31 AM   Modules accepted: Orders

## 2014-05-31 NOTE — H&P (Signed)
PATIENT NAME:  Sheri [name], Sheri [name] MR#:  [id number] DATE OF BIRTH:  1966/01/04  DATE OF ADMISSION:  05/07/2014  PRIMARY CARE PHYSICIAN:  [facility].   PRIMARY CARDIOLOGIST:  Dr. Irish Lack with [facility].    CHIEF COMPLAINT: Shortness of breath and hematuria.   HISTORY OF PRESENT ILLNESS: This is a very pleasant 49 year old female with a history of CAD with recent stent for ST elevation MI in the RCA 3 weeks ago at [facility], and hypertension, who presents with the above complaint. The patient was discharged about 3 weeks ago from [facility] with an ST elevation MI, had a stent in her RCA, went to see her primary cardiologist Dr. Irish Lack about a week ago. At that time her medications were adjusted, she was started on Imdur and her anticoagulation was changed from Brilinta to Effient.  Since the past 2 days she has had shortness of breath, pleuritic chest pain, but denies any lower extremity edema. She does have dyspnea on exertion as well. As far as her hematuria is concerned she has been having this since Saturday, noticed a little bit of blood in her urine on Saturday, more profoundly over the past few days. She became worried about the shortness of breath and hematuria, so she presented to the ER. In the ER and EKG does not show any acute ST elevation changes. Her first set of troponins are negative. While I am speaking with her she continues to have this palpitation and pleuritic chest pain.   REVIEW OF SYSTEMS:   CONSTITUTIONAL:  No recent illness. No fever, chills.  EYES: No problems with vision.  EARS, NOSE, AND THROAT:  No sore throat or sinus drainage.  GASTROINTESTINAL:  No nausea, vomiting, diarrhea, abdominal pain, black stools.  GENITOURINARY:  Positive hematuria. No dysuria, hesitancy, or urgency.  MUSCULOSKELETAL: She has some back pain which is chronic. No neck pain   SKIN:  No rash or lesions.   NEUROLOGIC: No history of CVA, TIA, or seizures.  PSYCHIATRIC: No history of  anxiety or depression.   PAST MEDICAL HISTORY:   1.  CAD status post recent ST elevation MI with stent in RCA, she also says she has a 70% blockage in another artery.   2.  Essential hypertension.  3.  Hyperlipidemia.   PAST SURGICAL HISTORY: Three C-sections.   MEDICATIONS:  1. Lisinopril 20 mg daily.  2. Imdur 30 mg daily.  3. Aspirin 81 mg daily.  4. Atorvastatin 80 mg at bedtime.  5. Align 4 mg p.o. b.i.d.  6. Effient 10 mg daily.  7. Nitroglycerin sublingual p.r.n. chest pain.  8. Metoprolol 25 b.i.d.   FAMILY HISTORY: Positive for CAD, her father died at 35 with a heart attack.   SOCIAL HISTORY: No tobacco, alcohol, or drug use.   PHYSICAL EXAMINATION:  VITAL SIGNS: Temperature is 97.8, pulse 69, respirations 18, blood pressure 123/74, 97% on room air.  GENERAL: The patient is alert, oriented, not in acute distress.   HEENT: Head is atraumatic. Pupils are round and reactive. Sclerae anicteric. Mucous membranes are moist. Oropharynx is clear.  NECK: Supple. No JVD, carotid bruit, or enlarged thyroid.  CARDIOVASCULAR: Regular rate and rhythm. No appreciable murmurs, gallops, or rubs. PMI is not displaced.  LUNGS:  Clear to auscultation without crackles, rales, rhonchi, or wheezing. Normal to percussion.  ABDOMEN: Bowel sounds positive. Nontender, nondistended. No hepatosplenomegaly.  EXTREMITIES: No clubbing, cyanosis or edema.  NEUROLOGIC:  Cranial nerves II through 12 are intact. There are no  focal deficits.  SKIN: Without rash or lesions.   LABORATORIES:  1.  Urinalysis is TNTC RBCs, rare white blood cells, negative nitrite, and leukocyte esterase. 2.  Sodium 139, potassium 4.3, chloride 105, bicarbonate 25, BUN 17, creatinine 0.66, glucose is 106, bilirubin 0.6, ALT 51, AST is 70.  3.  White blood cells 8.4, hemoglobin 13, hematocrit 39, platelets of 245,000.  4.  Troponin less than 0.03.  5.  D-dimer is 242.  6.  Chest x-ray shows no acute cardiopulmonary disease.   7.  EKG showed normal sinus rhythm with T wave inversions in the inferior leads.   ASSESSMENT AND PLAN: This is a 49 year old female with a recent ST segment elevation myocardial infarction status post a stent in right coronary artery, who presents with shortness of breath and hematuria.   1.  Unstable angina. Her symptoms could be consistent with unstable angina, I am also concerned she could have pulmonary emboli since she is just a few weeks out from her heart attack. She could also have a ventricular aneurysm, although I do not appreciate any murmurs on examination.  I am going to go ahead and order a CT of the chest to evaluate for a PE.  It is of note that she has a D-dimer, but she has high risk factors so I feel warranted to order a CT chest to evaluate for pulmonary emboli. Also order a 2-D echocardiogram. I will consult cardiology. Due to her hematuria I am holding aspirin and Effient for now, of course she will need this. She would be high risk of re-stent stenosis, but for today she already took her medication, so for today I am going to hold it and by that time cardiology will have seen her. I have already discussed this patient with the cardiologist.  We will cycle cardiac enzymes.  I am hesitant at this time to use heparin or lovenox due to her hematuria 2.  History of coronary artery disease. As mentioned above we will be holding aspirin and Effient  for today due to the hematuria, but she had already taken her medicine this morning. We will continue with metoprolol, atorvastatin, Imdur, and lisinopril, nitroglycerin p.r.n.3.  History of hypertension, blood pressure controlled on lisinopril, Imdur, and metoprolol.  4.  Hyperlipidemia. We will continue atorvastatin. She just recently had a lipid panel at [facility], I will not repeat these.   5. Hematuria: This is likelyfrom the use of effient. She does not have a UTI. She may benefit from a UROLOGY consult if this is persistent. She is at  high risk for restent thrombosis if we take her off of the EFFIENT. I have consulted cardiology regarding this and will continue to monitor. She may need bladder irrigation if this perisists. We will closely monitor this over the course of the day and if needed I will speak with UROLOGY. 5.  The patient is full code status.   TIME SPENT: Approximately 50 minutes.     ____________________________ Donell Beers. Benjie Karvonen, MD spm:bu D: 05/07/2014 13:47:25 ET T: 05/07/2014 14:10:28 ET JOB#: [id number]  cc: Aarin Bluett P. Benjie Karvonen, MD, <Dictator> Dr. Elyn Aquas P Kanetra Ho MD ELECTRONICALLY SIGNED 05/07/2014 17:27

## 2014-05-31 NOTE — Consult Note (Signed)
General Aspect Primary cardiologist: Dr. Irish Lack  The patient is a 49 year old Caucasian female with a past medical history of hypertension and recent inferior STEMI. She does have significant family history of early CAD, notably her father died of MI at age 64. She denies any smoking history, drinking history or any illicit drug use. She started having severe substernal chest pressure around 9:30 AM on 04/06/2014. She subsequently called EMS and was transported to [facility]. EKG showed ST elevation in the inferior lead with corresponding ST depression in anterior lead. Troponin peaked at 30. She was transferred emergently to [facility]. The patient was taken emergently to the cath lab for cardiac catheterization. The procedure was performed by Dr. Irish Lack. Access was obtained via the right radial artery. She was found to have an occluded mid right coronary artery which was the culprit vessel, successfully treated with PCI + DES. 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 There was a 70% lesion proximally as the vessel bends around to the lateral wall. Medical therapy was elected. She had a normal left main and only mild diease in the LAD and its branches. LVF was normal with EF of 55%.  Post procedure, she had NSVT, requiring IV amiodarone. This ultimately resolved and IV amiodarone was discontinued. She was switched from [place] to Effient due to dyspnea.   Present Illness She presented now with chest pain with deep breath which is different from her MI presentation. She has been complaining of exertional dyspnea since her MI. She noted blood in urine since saturday.   Physical Exam:  GEN no acute distress   NECK supple   RESP normal resp effort  clear BS   CARD Regular rate and rhythm  No murmur   ABD denies tenderness   LYMPH negative neck   EXTR negative cyanosis/clubbing, negative edema   NEURO cranial nerves intact   PSYCH alert, A+O to time, place,  person   Review of Systems:  Subjective/Chief Complaint chest pain and hematuria   General: Fatigue   Skin: No Complaints   ENT: No Complaints   Eyes: No Complaints   Neck: No Complaints   Respiratory: Short of breath   Cardiovascular: Chest pain or discomfort   Gastrointestinal: No Complaints   Genitourinary: Hematuria   Vascular: No Complaints   Musculoskeletal: No Complaints   Neurologic: No Complaints   Hematologic: No Complaints   Endocrine: No Complaints   Psychiatric: Nervousness   Review of Systems: All other systems were reviewed and found to be negative   Medications/Allergies Reviewed Medications/Allergies reviewed   Family & Social History:  Family and Social History:  Family History Non-Contributory   Social History negative tobacco   Place of Living Home   Home Medications: Medication Instructions Status  Align 4 mg oral capsule 1 cap(s) orally 2 times a day Active  aspirin 81 mg oral tablet, chewable 1 tab(s) orally once a day Active  atorvastatin 80 mg oral tablet 1 tab(s) orally once a day (at bedtime) Active  isosorbide mononitrate 30 mg oral tablet, extended release 0.5 tab(s) orally once a day (in the morning) Active  lisinopril 5 mg oral tablet 1 tab(s) orally once a day Active  metoprolol tartrate 25 mg oral tablet 1 tab(s) orally 2 times a day Active  nitroglycerin 0.4 mg sublingual spray 1 spray(s) sublingual every 5 minutes, As Needed - for Chest Pain Active  prasugrel 10 mg oral tablet 1 tab(s) orally once a day Active  Lab Results: LabObservation:  07-Apr-16 17:10   OBSERVATION Reason for Test  Hepatic:  07-Apr-16 10:18   Bilirubin, Total 0.6 (0.3-1.2 NOTE: New Reference Range  04/07/14)  Alkaline Phosphatase 97 (38-126 NOTE: New Reference Range  04/07/14)  SGPT (ALT) 51 (14-54 NOTE: New Reference Range  04/07/14)  SGOT (AST)  70 (15-41 NOTE: New Reference Range  04/07/14)  Total Protein, Serum 7.0  (6.5-8.1 NOTE: New Reference Range  04/07/14)  Albumin, Serum 3.9 (3.5-5.0 NOTE: New reference range  04/07/14)  Cardiology:  07-Apr-16 17:10   Echo Doppler REASON FOR EXAM:     COMMENTS:     PROCEDURE: [place] - ECHO DOPPLER COMPLETE(TRANSTHOR)  - May 07 2014  5:10PM   RESULT: Echocardiogram Report  Patient Name:   Sheri [name] Date of Exam: 05/07/2014 Medical Rec #:  [id number]        Custom1: Date of Birth:  [birth year]     Height:       65.0 in Patient Age:    36 years      Weight:       223.0 lb Patient Gender: F             BSA:          2.07 m??  Indications: MI Sonographer:    Arville Go RDCS Referring Phys: MODY, SITAL, P  Summary:  1. Left ventricular ejection fraction, by visual estimation, is 55 to  60%.  2. Normal global left ventricular systolic function.  3. Borderline concentric left ventricular hypertrophy.  4. Impaired relaxation pattern of LV diastolic filling. 2D AND M-MODE MEASUREMENTS (normal ranges within parentheses): Left Ventricle:          Normal IVSd (2D):      1.12 cm (0.7-1.1) LVPWd (2D):     1.08 cm (0.7-1.1) Aorta/LA:                  Normal LVIDd (2D):     4.78 cm (3.4-5.7) Aortic Root (2D): 3.10 cm (2.4-3.7) LVIDs (2D):     3.33 cm           Left Atrium (2D): 3.70 cm (1.9-4.0) LV FS (2D):     30.3 %   (>25%) LV EF (2D):     57.6 %   (>50%)                                   Right Ventricle:                                   RVd (2D): LV DIASTOLIC FUNCTION: MV Peak E: 0.73 m/s E/e' Ratio: 5.70 MV Peak A: 0.82 m/s Decel Time: 274 msec E/A Ratio: 0.89 SPECTRAL DOPPLER ANALYSIS (where applicable): Mitral Valve: MV P1/2 Time: 79.46 msec MV Area, PHT: 2.77 cm?? Aortic Valve: AoV Max Vel: 1.45 m/s AoV Peak PG: 8.4 mmHg AoV Mean PG: LVOT Vmax: 1.15 m/s LVOT VTI:  LVOT Diameter: 2.10 cm AoV Area, Vmax: 2.75 cm?? AoV Area, VTI:  AoV Area, Vmn: Pulmonic Valve: PV Max Velocity: 1.14 m/s PV Max PG: 5.2 mmHg PV Mean PG:  PHYSICIAN  INTERPRETATION: Left Ventricle: The left ventricular internal cavity size was normal. LV  posterior wall thickness was normal. Borderline concentric left  ventricular hypertrophy. Global LV systolic function was normal. Left  ventricular ejection fraction, by visual estimation,  is 55 to 60%.  Spectral Doppler shows impaired relaxation pattern of LV diastolic  filling. Right Ventricle: Normal right ventricular size, wall thickness, and  systolic function. Left Atrium: The left atrium is normal in size and structure. Right Atrium: The right atrium is normal in size. Pericardium: There is no evidence of pericardial effusion. Mitral Valve: The mitral valve is normal in structure. No evidence of   mitral valve stenosis. Trace mitral valve regurgitation is seen. Tricuspid Valve: The tricuspid valve is normal. Trivial tricuspid  regurgitation is visualized. Aortic Valve: The aortic valve is trileaflet and structurally normal,  with normal leaflet excursion; without any evidence of aortic stenosis or  insufficiency. Pulmonic Valve: The pulmonic valve is not well seen. Aorta: The aortic root is normal in size and structure. Venous: The inferior vena cava was normal. The inferior vena cava was  normal sized.  [id number] Kathlyn Sacramento MD Electronically signed by [id number] Kathlyn Sacramento MD Signature Date/Time: 05/07/2014/5:58:15 PM *** Final ***  IMPRESSION: .    Verified By: Mertie Clause. ARIDA, M.D., MD  Routine Chem:  07-Apr-16 10:18   Glucose, Serum  106 (65-99 NOTE: New Reference Range  04/07/14)  BUN 17 (6-20 NOTE: New Reference Range  04/07/14)  Creatinine (comp) 0.66 (0.44-1.00 NOTE: New Reference Range  04/07/14)  Sodium, Serum 139 (135-145 NOTE: New Reference Range  04/07/14)  Potassium, Serum 4.3 (3.5-5.1 NOTE: New Reference Range  04/07/14)  Chloride, Serum 105 (101-111 NOTE: New Reference Range  04/07/14)  CO2, Serum 25 (22-32 NOTE: New Reference Range  04/07/14)   Calcium (Total), Serum 9.2 (8.9-10.3 NOTE: New Reference Range  04/07/14)  eGFR (African American) >60  eGFR (Non-African American) >60 (eGFR values <81m/min/1.73 m2 may be an indication of chronic kidney disease (CKD). Calculated eGFR is useful in patients with stable renal function. The eGFR calculation will not be reliable in acutely ill patients when serum creatinine is changing rapidly. It is not useful in patients on dialysis. The eGFR calculation may not be applicable to patients at the low and high extremes of body sizes, pregnant women, and vegetarians.)  Anion Gap 9  Cardiac:  07-Apr-16 10:18   Troponin I <0.03 (0.00-0.03 0.03 ng/mL or less: NEGATIVE  Repeat testing in 3-6 hrs  if clinically indicated. >0.05 ng/mL: POTENTIAL  MYOCARDIAL INJURY. Repeat  testing in 3-6 hrs if  clinically indicated. NOTE: An increase or decrease  of 30% or more on serial  testing suggests a  clinically important change NOTE: New Reference Range  04/07/14)  CK, Total  2034 (38-234 NOTE: New Reference Range  04/07/14)  CPK-MB, Serum 5.0 (0.5-5.0 NOTE: New Reference Range  04/07/14)    15:05   Troponin I <0.03 (0.00-0.03 0.03 ng/mL or less: NEGATIVE  Repeat testing in 3-6 hrs  if clinically indicated. >0.05 ng/mL: POTENTIAL  MYOCARDIAL INJURY. Repeat  testing in 3-6 hrs if  clinically indicated. NOTE: An increase or decrease  of 30% or more on serial  testing suggests a  clinically important change NOTE: New Reference Range  04/07/14)  CK, Total  1713 (38-234 NOTE: New Reference Range  04/07/14)  CPK-MB, Serum 4.0 (0.5-5.0 NOTE: New Reference Range  04/07/14)  Routine UA:  07-Apr-16 11:24   Color (UA) YELLOW  Clarity (UA) HAZY  Glucose (UA) NEGATIVE  Bilirubin (UA) NEGATIVE  Ketones (UA) NEGATIVE  Specific Gravity (UA) 1.010  Blood (UA) 1+  pH (UA) 7.0  Protein (UA) 30 mg/dL  Nitrite (UA) NEGATIVE  Leukocyte Esterase (UA) NEGATIVE (Result(s) reported  on 07 May 2014 at 11:54AM.)  RBC (UA) TNTC  WBC (UA) RARE  Bacteria (UA) 1+ (TRACE/FEW)  Epithelial Cells (UA) 0-5 / HPF  Result(s) reported on 07 May 2014 at 11:54AM.  Routine Coag:  07-Apr-16 10:18   D-Dimer, Quantitative 242 (INTERPRETATION <> Exclusion of Venous Thromboembolism (VTE) - OUTPATIENT ONLY       (Emergency Department or [place])             0-499 ng/ml (FEU)  : With a low to intermediate pretest                                  probability for VTE this test result                                  excludes the diagnosis of VTE.             > 499 ng/ml (FEU)  : VTE not excluded; additional work up                                  for VTE is required. <> Testing on Inpatients and Evaluation of Disseminated Intravascular        Coagulation (DIC)             Reference Range:  0-499 ng/ml (FEU))  Routine Hem:  07-Apr-16 10:18   WBC (CBC) 8.4  RBC (CBC) 4.56  Hemoglobin (CBC) 12.9  Hematocrit (CBC) 38.6  Platelet Count (CBC) 245 (Result(s) reported on 07 May 2014 at 10:41AM.)  MCV 85  MCH 28.4  MCHC 33.6  RDW 13.6   Radiology Results: Cardiology:    07-Apr-16 17:10, Echo Doppler  Echo Doppler   REASON FOR EXAM:      COMMENTS:       PROCEDURE: [place] - ECHO DOPPLER COMPLETE(TRANSTHOR)  - May 07 2014  5:10PM     RESULT: Echocardiogram Report    Patient Name:   SHAQUASIA CAPONIGRO Alonge Date of Exam: 05/07/2014  Medical Rec #:  [id number]        Custom1:  Date of Birth:  [birth year]     Height:       65.0 in  Patient Age:    20 years      Weight:       223.0 lb  Patient Gender: F             BSA:          2.07 m??    Indications: MI  Sonographer:    Tikeshia Stills RDCS  Referring Phys: MODY, SITAL, P    Summary:   1. Left ventricular ejection fraction, by visual estimation, is 55 to   60%.   2. Normal global left ventricular systolic function.   3. Borderline concentric left ventricular hypertrophy.   4. Impaired relaxation pattern of LV diastolic filling.  2D AND M-MODE  MEASUREMENTS (normal ranges within parentheses):  Left Ventricle:          Normal  IVSd (2D):      1.12 cm (0.7-1.1)  LVPWd (2D):     1.08 cm (0.7-1.1) Aorta/LA:                  Normal  LVIDd (2D):     4.78 cm (  3.4-5.7) Aortic Root (2D): 3.10 cm (2.4-3.7)  LVIDs (2D):     3.33 cm           Left Atrium (2D): 3.70 cm (1.9-4.0)  LV FS (2D):     30.3 %   (>25%)  LV EF (2D):     57.6 %   (>50%)                                    Right Ventricle:                                    RVd (2D):  LV DIASTOLIC FUNCTION:  MV Peak E: 0.73 m/s E/e' Ratio: 5.70  MV Peak A: 0.82 m/s Decel Time: 274 msec  E/A Ratio: 0.89  SPECTRAL DOPPLER ANALYSIS (where applicable):  Mitral Valve:  MV P1/2 Time: 79.46 msec  MV Area, PHT: 2.77 cm??  Aortic Valve: AoV Max Vel: 1.45 m/s AoV Peak PG: 8.4 mmHg AoV Mean PG:  LVOT Vmax: 1.15 m/s LVOT VTI:  LVOT Diameter: 2.10 cm  AoV Area, Vmax: 2.75 cm?? AoV Area, VTI:  AoV Area, Vmn:  Pulmonic Valve:  PV Max Velocity: 1.14 m/s PV Max PG: 5.2 mmHg PV Mean PG:    PHYSICIAN INTERPRETATION:  Left Ventricle: The left ventricular internal cavity size was normal. LV   posterior wall thickness was normal. Borderline concentric left   ventricular hypertrophy. Global LV systolic function was normal. Left   ventricular ejection fraction, by visual estimation, is 55 to 60%.   Spectral Doppler shows impaired relaxation pattern of LV diastolic   filling.  Right Ventricle: Normal right ventricular size, wall thickness, and   systolic function.  Left Atrium: The left atrium is normal in size and structure.  Right Atrium: The right atrium is normal in size.  Pericardium: There is no evidence of pericardial effusion.  Mitral Valve: The mitral valve is normal in structure. No evidence of     mitral valve stenosis. Trace mitral valve regurgitation is seen.  Tricuspid Valve: The tricuspid valve is normal. Trivial tricuspid   regurgitation is visualized.  Aortic Valve: The aortic  valve is trileaflet and structurally normal,   with normal leaflet excursion; without any evidence of aortic stenosis or   insufficiency.  Pulmonic Valve: The pulmonic valve is not well seen.  Aorta: The aortic root is normal in size and structure.  Venous: The inferior vena cava was normal. The inferior vena cava was   normal sized.    [id number] Kathlyn Sacramento MD  Electronically signed by [id number] Kathlyn Sacramento MD  Signature Date/Time: 05/07/2014/5:58:15 PM  *** Final ***    IMPRESSION: .        Verified By: Mertie Clause. Fletcher Anon, M.D., MD    Pen-Kera: Hives, Rash  Penicillin: Rash  Vital Signs/Nurse's Notes: **Vital Signs.:   07-Apr-16 14:43  Vital Signs Type Admission  Temperature Temperature (F) 97.4  Celsius 36.3  Temperature Source oral  Pulse Pulse 64  Respirations Respirations 20  Systolic BP Systolic BP 622  Diastolic BP (mmHg) Diastolic BP (mmHg) 75  Mean BP 90  Pulse Ox % Pulse Ox % 97  Pulse Ox Activity Level  At rest  Oxygen Delivery Room Air/ 21 %    Impression 1. Chest pain: this seems to be pleuretic and different from her MI presentation.  I doubt unstable angina. ECG with inferior TW inversion which is not new. TnI is normal.  Echo showed normal LVSF and wall motion with no pericardial effusion.  CTA negative for PE.  No further work up is recommended. Ambulate in am and if symptoms free, can discharge home to follow up with Dr. Irish Lack.   2. CAD s.p recent inferior with PCI and DES placement to RCA.  I am switching Effient to Plavix due to hematuria.  Continue other cardiac medications.  LCX PCI is being considered by Dr. Irish Lack.   3. Hematuria: I switched Effient to Plavix which is less potent. She needs to stay on dual antiplatelt therapy due to recent MI and DES placement.  If hematuria persists, recommend urology consult.   Electronic Signatures: Kathlyn Sacramento (MD)  (Signed 07-Apr-16 18:30)  Authored: General Aspect/Present Illness, History and  Physical Exam, Review of System, Family & Social History, Home Medications, Labs, Radiology, Allergies, Vital Signs/Nurse's Notes, Impression/Plan   Last Updated: 07-Apr-16 18:30 by Kathlyn Sacramento (MD)

## 2014-05-31 NOTE — Discharge Summary (Signed)
PATIENT NAME:  Sheri [name], [name] MR#:  [id number] DATE OF BIRTH:  Nov 25, 1965  DATE OF ADMISSION:  05/07/2014 DATE OF DISCHARGE:  05/08/2014  ADMISSION DIAGNOSIS: Chest pain.  DISCHARGE DIAGNOSES:  1.  Chest pain, likely noncardiac, pleuritic, right sided.  2.  Rhabdomyolysis, likely due to statin, resolving. 3.  Hematuria, suspected due to Effient. 4.  Hyperglycemia with hemoglobin A1c unfortunately not reported.  5.  History of coronary artery disease, status post myocardial infarction, percutaneous coronary intervention and stent to right coronary artery approximately 3 weeks ago.  6.  Essential hypertension.  7.  Hyperlipidemia.  8.  Obesity with body mass index 37.2.   DISCHARGE CONDITION: Stable.   DISCHARGE MEDICATIONS: The patient is to continue Align 4 mg twice daily, aspirin 81 mg p.o. daily, isosorbide mononitrate 30 mg p.o. half tablet once daily, lisinopril 5 mg daily, metoprolol 25 mg twice daily, nitroglycerin 0.4 mg sublingually every 5 minutes as needed, atorvastatin 40 mg p.o. daily. New medication: Plavix 75 mg p.o. daily. The patient is not to take prasugrel or Effient.   HOME OXYGEN: None.   DIET: A 2-gram salt, low-fat, low-cholesterol, regular consistency.   ACTIVITY LIMITATIONS: As tolerated.   FOLLOWUP: Appointment with Dr. Irish Lack in 1 week after discharge and also primary care physician at [facility] in 2 days after discharge.   CONSULTANTS: Care management, social worker, Dr. Fletcher Anon and his PA Mr.  Christell Faith.   RADIOLOGIC STUDIES: Echocardiogram 05/07/2014 revealing left ventricular ejection fraction by visual estimation 55% to 60%, normal global left ventricular systolic function, borderline concentric left ventricular hypertrophy, impaired relaxation pattern of left ventricular diastolic filling. Chest x-ray, portable, single view, 05/07/2014 showed no acute abnormalities. CT angiogram to rule out pulmonary embolism 05/07/2014 revealed no evidence of  pulmonary embolism or other acute findings in the chest.   HISTORY OF PRESENT ILLNESS: The patient is a 49 year old Caucasian female with past medical history significant for history of coronary artery disease who had an MI as well as stent to RCA approximately 3 weeks ago, presented to the hospital with complaints of chest pain with  shortness of breath, as well as hematuria. Please refer to Dr. Gardiner Coins admission note on 05/07/2014. On arrival to the hospital, the patient's temperature was 97.8, pulse was 69, respiration was 18, blood pressure 123/74, saturation was 97% on room air. Physical exam was unremarkable. The patient's EKG done in the Emergency Room showed normal sinus rhythm with T wave inversions in inferior leads. The patient's lab data done on arrival showed glucose of 106; otherwise, BMP was normal. Liver enzymes revealed AST elevation to 70. CK total was high at 2034. MB fraction as well as troponins were within normal limits. On the first set, CK level was elevated at 1713. On the second set 1488. On the third set MB fraction as well as troponin were within normal limits. On the first through the third set. CBC was within normal limits. D-dimer was normal at 2.42. Urinalysis revealed yellow hazy urine, negative for glucose bilirubin, or ketones, specific gravity was 1.010, pH was 7.1 with 1+ blood, 30 mg/dL protein, negative for nitrites or leukocyte esterase, too numerous to count red blood cells, rare white blood cells, 1+ bacteria, and 0 to 5 epithelial cells.   HOSPITAL COURSE: The patient was admitted to the hospital for further evaluation. Her cardiac enzymes were cycled and cardiology consultation was obtained. Cardiologist, Dr. Fletcher Anon, saw the patient in consultation the same day. He felt that the patient's chest  pain likely is noncardiac; recommended to get, however, an echocardiogram to rule out any cardiac event, and since the patient's echocardiogram showed normal left ventricular  systolic function, no wall motion abnormalities, and no pericardial effusion, and her CTA was negative for PE, recommended no further workup. He recommended to ambulate the patient and discharge home with follow up with Dr. Irish Lack. In regards to coronary artery disease, however, plus hematuria, he felt that the patient should be switched from Effient to Plavix due to hematuria. If hematuria persists, he recommended urology consultation. On the day of discharge, 05/08/2014, she had no significant pain. Her symptoms were subsiding and her O2 saturations remained stable on ambulation. She was felt to be stable to be discharged home.   In regards to her rhabdomyolysis, the patient's rhabdomyolysis was felt to be due to statin. Statin dose was decreased. It is recommended to follow the patient's physical exam and clinically, and follow her CK total levels to rule out continuous persistent elevation of CK total and significant  myopathy. At that point, the patient's statin should be discontinued or changed to different.   On 05/08/2014, the patient's vital signs were stable with temperature of 97.9, pulse was 71, respirations were 18, blood pressure 100/66, saturation was 97% on room air at rest and 96% on exertion.   TIME SPENT: 40 minutes with the patient.    ____________________________ Theodoro Grist, MD rv:TT D: 05/08/2014 19:07:42 ET T: 05/09/2014 02:12:52 ET JOB#: [id number]  cc: Theodoro Grist, MD, <Dictator> [facility] Larae Grooms, MD    [place] MD ELECTRONICALLY SIGNED 05/10/2014 15:49

## 2014-06-05 ENCOUNTER — Other Ambulatory Visit: Payer: Self-pay | Admitting: *Deleted

## 2014-06-05 MED ORDER — CLOPIDOGREL BISULFATE 75 MG PO TABS: 75.0000 mg | ORAL_TABLET | Freq: Every day | ORAL | Status: DC

## 2014-06-17 ENCOUNTER — Encounter: Payer: Self-pay | Admitting: Nurse Practitioner

## 2014-06-17 ENCOUNTER — Ambulatory Visit (INDEPENDENT_AMBULATORY_CARE_PROVIDER_SITE_OTHER): Payer: BLUE CROSS/BLUE SHIELD | Admitting: Nurse Practitioner

## 2014-06-17 VITALS — BP 128/80 | HR 70 | Ht 65.0 in | Wt 232.0 lb

## 2014-06-17 DIAGNOSIS — E785 Hyperlipidemia, unspecified: Secondary | ICD-10-CM

## 2014-06-17 DIAGNOSIS — I2119 ST elevation (STEMI) myocardial infarction involving other coronary artery of inferior wall: Secondary | ICD-10-CM

## 2014-06-17 DIAGNOSIS — R0789 Other chest pain: Secondary | ICD-10-CM | POA: Diagnosis not present

## 2014-06-17 LAB — CK: Total CK: 42 U/L (ref 7–177)

## 2014-06-17 NOTE — Patient Instructions (Addendum)
We will be checking the following labs today - CK total   Medication Instructions:    Continue with your current medicines.     Testing/Procedures To Be Arranged:  N/A  Follow-Up:   See Dr. Irish Lack as planned    Other Special Instructions:   N/A  Call the [place] office at [phone] if you have any questions, problems or concerns.

## 2014-06-17 NOTE — Progress Notes (Signed)
CARDIOLOGY OFFICE NOTE  Date:  06/17/2014    Sheri [name] Date of Birth: [birth year] Medical Record #[id number]  PCP:  No primary care provider on file.  Cardiologist:  Irish Lack  Chief Complaint  Patient presents with  . Coronary Artery Disease    6 week check - seen for Dr. Irish Lack    History of Present Illness: Sheri [name] is a 48 y.o. female who presents today for a 6 week check. She has known CAD with an inferior MI in 3/16. She had a stent to the RCA. 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 At the time of her MI, she had excessive sweatiness and nausea along with tooth pain. She was lightheaded and felt that she would pass out. She got home and had left arm, left jaw and chest pressure. She knew it was an MI and came to the hospital.   She has been seen back several times since her initial event - very anxious about her situation.   She visited the emergency room at [facility] on April 7. At that time, she had been having some blood in her urine along with some back pain and shortness of breath. She described pain in her right side of her back. She was in the emergency room and had a negative troponin. They did find that her total CK was elevated at 1488. She had a negative chest x-ray. They were not concerned about ischemia. They decreased her atorvastatin from 80 mg down to 40 mg.   She was seen 6 weeks ago - still with lots of issues - Imdur was started previously and there was a conversation about proceeding on with repeat cath. Her statin was stopped altogether at her last visit. CPK total and MB ordered - NO RESULTS FOUND.   Comes back today. Here alone today. She says she is doing ok. Brought in her lipids from work. TC 176, HLD 32, TG 96 and LDL 125. Glucose was 105. She remains off of her statin. Not exercising regularly - weight is up. Glucose marginally up. No more chest pain. Her breathing is good. She will get tired after being active - this is  not new or worse and was present even prior to her MI. She is wanting to proceed with PCI at the end of June.    Past Medical History  Diagnosis Date  . Hypertension     Past Surgical History  Procedure Laterality Date  . Cesarean section    . Left heart catheterization with coronary angiogram N/A 04/06/2014    Procedure: LEFT HEART CATHETERIZATION WITH CORONARY ANGIOGRAM;  Surgeon: Jettie Booze, MD;  Location: [facility] CATH LAB;  Service: Cardiovascular;  Laterality: N/A;     Medications: Current Outpatient Prescriptions  Medication Sig Dispense Refill  . aspirin 81 MG chewable tablet Chew 1 tablet (81 mg total) by mouth daily.    . Chlorphen-PE-Acetaminophen (CORICIDIN D COLD/FLU/SINUS PO) Take by mouth every 8 (eight) hours.    . clopidogrel (PLAVIX) 75 MG tablet Take 1 tablet (75 mg total) by mouth daily. 30 tablet 0  . isosorbide mononitrate (IMDUR) 30 MG 24 hr tablet Take 0.5 tablets (15 mg total) by mouth daily. 45 tablet 3  . lisinopril (PRINIVIL,ZESTRIL) 5 MG tablet Take 1 tablet (5 mg total) by mouth daily. 90 tablet 3  . metoprolol tartrate (LOPRESSOR) 25 MG tablet Take 1 tablet (25 mg total) by mouth 2 (two) times daily. 180 tablet 3  .  nitroGLYCERIN (NITROSTAT) 0.4 MG SL tablet Place 1 tablet (0.4 mg total) under the tongue every 5 (five) minutes x 3 doses as needed for chest pain. 25 tablet 2  . Probiotic Product (ALIGN) 4 MG CAPS Take 4 mg by mouth 2 (two) times daily.     No current facility-administered medications for this visit.    Allergies: No Known Allergies  Social History: The patient  reports that she has quit smoking. She does not have any smokeless tobacco history on file. She reports that she does not drink alcohol or use illicit drugs.   Family History: The patient's family history includes Heart attack (age of onset: 11) in her father; Hyperlipidemia in her mother; Hypertension in her mother.   Review of Systems: Please see the history of present  illness.   Otherwise, the review of systems is positive for .   All other systems are reviewed and negative.   Physical Exam: VS:  BP 128/80 mmHg  Pulse 70  Ht $R'5\' 5"'Op$  (1.651 m)  Wt 232 lb (105.235 kg)  BMI 38.61 kg/m2  SpO2 97% .  BMI Body mass index is 38.61 kg/(m^2).  Wt Readings from Last 3 Encounters:  06/17/14 232 lb (105.235 kg)  05/12/14 228 lb 12.8 oz (103.783 kg)  04/20/14 224 lb (101.606 kg)    General: Pleasant. Well developed, well nourished and in no acute distress. She is obese. She has gained 8 pounds since March.  HEENT: Normal. Neck: Supple, no JVD, carotid bruits, or masses noted.  Cardiac: Regular rate and rhythm. No murmurs, rubs, or gallops. No edema.  Respiratory:  Lungs are clear to auscultation bilaterally with normal work of breathing.  GI: Soft and nontender.  MS: No deformity or atrophy. Gait and ROM intact. Skin: Warm and dry. Color is normal.  Neuro:  Strength and sensation are intact and no gross focal deficits noted.  Psych: Alert, appropriate and with normal affect.   LABORATORY DATA:  EKG:  EKG is not ordered today.   Lab Results  Component Value Date   WBC 8.4 05/07/2014   HGB 12.9 05/07/2014   HCT 38.6 05/07/2014   PLT 245 05/07/2014   GLUCOSE 120* 05/26/2014   CHOL 179 04/07/2014   TRIG 125 04/07/2014   HDL 33* 04/07/2014   LDLCALC 121* 04/07/2014   ALT 51 05/07/2014   AST 70* 05/07/2014   NA 136 05/26/2014   K 4.4 05/26/2014   CL 102 05/26/2014   CREATININE 0.63 05/26/2014   BUN 22 05/26/2014   CO2 29 05/26/2014   TSH 2.145 04/06/2014   HGBA1C 5.6 04/06/2014    BNP (last 3 results) No results for input(s): BNP in the last 8760 hours.  ProBNP (last 3 results)  Recent Labs  05/12/14 1252  PROBNP 61.0     Other Studies Reviewed Today:  PROCEDURE: Left heart catheterization with selective coronary angiography, left ventriculogram. Aspiration thrombectomy of the RCA. PCI of the RCA.  INDICATIONS: Acute  inferolateral ST elevation MI  The risks, benefits, and details of the procedure were explained to the patient. The patient verbalized understanding and wanted to proceed. Emergency consent was obtained.  PROCEDURE TECHNIQUE: After Xylocaine anesthesia a 33F slender sheath was placed in the right radial artery with a single anterior needle wall stick. IV Heparin was given. Right coronary angiography was done using a Judkins R4 guide catheter. Left coronary angiography was done using a Judkins L3.5 guide catheter. Left ventriculography was done using a pigtail catheter. 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 A TR band was used for hemostasis.  CONTRAST: Total of 165 cc.  COMPLICATIONS: None.   HEMODYNAMICS: Aortic pressure was 108/72; LV pressure was 105/11; LVEDP 18. There was no gradient between the left ventricle and aorta.   ANGIOGRAPHIC DATA: The left main coronary artery is absent. The patient has separate ostia of her LAD and circumflex.  The left anterior descending artery is a large vessel which wraps around the apex. In the mid vessel, there is mild disease. There are 3 large diagonals which appear patent.  The left circumflex artery is a large vessel which has an anomalous takeoff, from the right cusp. There does appear to be a 70% lesion proximally. There are 2 large obtuse marginal branches which appear patent. It was difficult to engage this vessel selectively.  The right coronary artery is occluded in the midportion. After intervention, it was noted that the vessel was large and dominant. There is a large posterior lateral artery and a large posterior descending artery which is widely patent.  LEFT VENTRICULOGRAM: Left ventricular angiogram was done in the 30 RAO projection and revealed basal inferior hypokinesis and normal systolic function with an estimated  ejection fraction of 55 %. LVEDP was 18 mmHg.  PCI NARRATIVE: Angiomax, Brilinta and Cangrelor were used for anticoagulation. ACT was used to check that the anticoagulation was therapeutic. A JR4 guiding catheter was used to engage the RCA. A pro-water wire was placed down the RCA across the area of occlusion in the mid RCA. A fetch catheter was advanced and aspiration thrombectomy was performed. TIMI-3 flow was restored after the fetch catheter. At this point, her heart rate slowed down significantly. We are ready to give atropine but her heart rate picked up on her own. During the case, she had short bursts of nonsustained ventricular tachycardia. This settled down by the end of the case. A large thrombus burden was removed with the fetch catheter. A second pass was made with the fetch catheter as well. A 2.0 x 15 balloon was used to predilate. A 3.0 x 24 Synergy drug-eluting stent was then used to treat the lesion. The stent was postdilated with a 3.25 x 20 noncompliant balloon, inflated to 18 atm. There was an excellent angiographic result. Intracoronary nitroglycerin was given.  IMPRESSIONS:  1. Normal left main coronary artery. 2. Mild disease in the left anterior descending artery and its branches. 3. 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 There is a 70% lesion proximally as the vessel bends around to the lateral wall. It was difficult to selectively engage this vessel. The AR-1 catheter gave [place] the best visualization. Likely, if intervention was needed in the future, a JR4 guide could be used and hopefully, a wire could be placed into the circumflex to help draw in the guide.  4. 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 This was successfully treated with a 3.0 x 24 Synergy drug-eluting stent, postdilated to 3.3 mm in diameter. 5. Overall normal left ventricular systolic function. LVEDP 18 mmHg. Ejection fraction  55%.  RECOMMENDATION: Continue dual antiplatelet therapy for at least a year. She'll need aggressive secondary prevention. Would base further evaluation of her circumflex lesion on her symptoms. She'll be watched in the ICU. Will continue her low-dose Angiomax until the bag runs out. Start high-dose statin and low-dose beta blocker.  Echo Study Conclusions from 05/2014  - Left ventricle: The cavity size was normal. Wall thickness was normal. The estimated ejection fraction was 55%. Basal inferior hypokinesis. Features are consistent with a pseudonormal left ventricular filling pattern, with concomitant abnormal relaxation and increased filling pressure (grade 2 diastolic dysfunction). - Aortic valve: There was no stenosis. - Mitral valve: Mildly calcified annulus. Mildly calcified leaflets . There was trivial regurgitation. - Right ventricle: The cavity size was normal. Systolic function was normal. - Tricuspid valve: No complete TR doppler jet so unable to estimate PA systolic pressure. - Systemic veins: IVC measured 2.0 cm with < 50% respirophasic variation, suggesting RA pressure 8 mmHg.  Impressions:  - Normal LV size with EF 55%. Moderate diastolic dysfunction. Basal inferior hypokinesis. Normal RV size and systolic function. No significant valvular abnormalities.  Assessment/Plan: 1. Coronary artery disease/recent inferior wall MI: Continue dual antiplatelet therapy. Plavix was substituted for Effient after her episode of hematuria. Continue antianginal therapy. Her symptoms have now ceased and she is doing well. Needs to work on CV risk factor modification. I have explained to her Dr. Hassell Done plan after verifying with him in the office today. She will see Dr. Corrie Dandy was i back as planned.  2. Hyperlipidemia: atorvastatin was stopped completely given the elevated CK. Will recheck CK. If it is coming down nicely, could consider a  different statin or may need to consider a different class of drugs. LDL remains above her target.  3. Anxiety - seems better  4. Obesity - weight is up - needs to work on CV risk factor modification.   Current medicines are reviewed with the patient today.  The patient does not have concerns regarding medicines other than what has been noted above.  The following changes have been made:  See above.  Labs/ tests ordered today include:    Orders Placed This Encounter  Procedures  . CK (Creatine Kinase)     Disposition:   FU with Dr. Irish Lack in June as planned.   Patient is agreeable to this plan and will call if any problems develop in the interim.   Signed: Burtis Junes, RN, ANP-C 06/17/2014 1:57 PM  [facility] [address] [place], [place]  [address] Phone: [phone] Fax: [phone]

## 2014-06-19 ENCOUNTER — Telehealth: Payer: Self-pay | Admitting: Interventional Cardiology

## 2014-06-19 NOTE — Telephone Encounter (Signed)
If she wants to do the home test kit, we can sign for it.  I am not sure if it was received.

## 2014-06-19 NOTE — Telephone Encounter (Signed)
Follow Up    Calling regarding a recent fax request for a home test kit sent over on May 7. Following up on status.

## 2014-06-19 NOTE — Telephone Encounter (Signed)
[facility] calling stating they faxed a request for the pt to receive a home test kit for Plavix.  It is a swab that is free to pt to test since she is on Plavix.  States they just want to know if form was received.  States Dr. Irish Lack can either decline or accept for pt to receive. Advised will forward to Dr. Irish Lack and his nurse Zerita Boers to see if form was received.  When calling [facility] back at [phone] with reference # [id number].

## 2014-06-22 NOTE — Telephone Encounter (Signed)
Left message at number provided by Derek Mound to resend fax.

## 2014-06-22 NOTE — Telephone Encounter (Signed)
Fax received and placed in Dr. Hassell Done folder to fill out.

## 2014-06-24 ENCOUNTER — Other Ambulatory Visit: Payer: Self-pay | Admitting: *Deleted

## 2014-06-24 DIAGNOSIS — E785 Hyperlipidemia, unspecified: Secondary | ICD-10-CM

## 2014-06-24 MED ORDER — ROSUVASTATIN CALCIUM 10 MG PO TABS: 10.0000 mg | ORAL_TABLET | Freq: Every day | ORAL | Status: DC

## 2014-07-06 ENCOUNTER — Other Ambulatory Visit (INDEPENDENT_AMBULATORY_CARE_PROVIDER_SITE_OTHER): Payer: BLUE CROSS/BLUE SHIELD

## 2014-07-06 ENCOUNTER — Ambulatory Visit (INDEPENDENT_AMBULATORY_CARE_PROVIDER_SITE_OTHER): Payer: BLUE CROSS/BLUE SHIELD | Admitting: Interventional Cardiology

## 2014-07-06 ENCOUNTER — Encounter: Payer: Self-pay | Admitting: Interventional Cardiology

## 2014-07-06 ENCOUNTER — Telehealth: Payer: Self-pay

## 2014-07-06 VITALS — BP 118/70 | HR 78 | Ht 65.0 in | Wt 236.2 lb

## 2014-07-06 DIAGNOSIS — R0789 Other chest pain: Secondary | ICD-10-CM

## 2014-07-06 DIAGNOSIS — E785 Hyperlipidemia, unspecified: Secondary | ICD-10-CM

## 2014-07-06 DIAGNOSIS — R748 Abnormal levels of other serum enzymes: Secondary | ICD-10-CM | POA: Diagnosis not present

## 2014-07-06 DIAGNOSIS — I2510 Atherosclerotic heart disease of native coronary artery without angina pectoris: Secondary | ICD-10-CM

## 2014-07-06 DIAGNOSIS — E669 Obesity, unspecified: Secondary | ICD-10-CM

## 2014-07-06 DIAGNOSIS — Z6841 Body Mass Index (BMI) 40.0 and over, adult: Secondary | ICD-10-CM

## 2014-07-06 MED ORDER — ISOSORBIDE MONONITRATE ER 30 MG PO TB24: 30.0000 mg | ORAL_TABLET | Freq: Every day | ORAL | Status: DC

## 2014-07-06 NOTE — Progress Notes (Signed)
Patient Sheri: Sheri [name], female   DOB: [birth year], 49 y.o.   MRN: [id number]     Cardiology Office Note   Date:  07/06/2014   Sheri:  Sheri [name], DOB Apr 08, 1965, MRN [id number]  PCP:  No PCP Per Patient    No chief complaint on file.  follow-up coronary artery disease   Wt Readings from Last 3 Encounters:  07/06/14 236 lb 3.2 oz (107.14 kg)  06/17/14 232 lb (105.235 kg)  05/12/14 228 lb 12.8 oz (103.783 kg)       History of Present Illness: Sheri [name] is a 49 y.o. female  has known CAD with an inferior MI in 3/16. She had a stent to the RCA. 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 At the time of her MI, she had excessive sweatiness and nausea along with tooth pain. She was lightheaded and felt that she would pass out. She got home and had left arm, left jaw and chest pressure. She knew it was an MI and came to the hospital.   She has been seen back several times since her initial event - very anxious about her situation. She is concerned because her father died from a heart attack at age 34. She is concerned about another cardiac event occurring.  She visited the emergency room at [facility] on May 07, 2014. At that time, she had been having some blood in her urine along with some back pain and shortness of breath. She described pain in her right side of her back. She was in the emergency room and had a negative troponin. They did find that her total CK was elevated at 1488. She had a negative chest x-ray. They were not concerned about ischemia. They decreased her atorvastatin from 80 mg down to 40 mg.   She was seen in May 2016, overall feeling better. Imdur was started previously and there was a conversation about proceeding on with repeat cath. Her statin was stopped altogether due to the elevated CK. Repeat CK was ordered but not obtained. At the last visit with Truitt Merle, it was explained to the patient that if she is asymptomatic, we would not proceed with  repeat catheterization.  Over the course of the last few weeks, she reports a stretching feeling in her chest. It occurs with anxiety and with yard work. She continues to gain weight. She states that she is craving carbohydrates. She is not able to exercise as much because she feels more fatigued since her heart attack. She had lost over 70 pounds and got down to a low weight of 220. She has gained back 16 pounds. She continues to be very nervous about the possibility of another cardiac event.    Past Medical History  Diagnosis Date  . Hypertension     Past Surgical History  Procedure Laterality Date  . Cesarean section    . Left heart catheterization with coronary angiogram N/A 04/06/2014    Procedure: LEFT HEART CATHETERIZATION WITH CORONARY ANGIOGRAM;  Surgeon: Jettie Booze, MD;  Location: [facility] CATH LAB;  Service: Cardiovascular;  Laterality: N/A;     Current Outpatient Prescriptions  Medication Sig Dispense Refill  . aspirin 81 MG chewable tablet Chew 1 tablet (81 mg total) by mouth daily.    . clopidogrel (PLAVIX) 75 MG tablet Take 1 tablet (75 mg total) by mouth daily. 30 tablet 0  . isosorbide mononitrate (IMDUR) 30 MG 24 hr tablet Take 0.5 tablets (15 mg total) by mouth  daily. 45 tablet 3  . lisinopril (PRINIVIL,ZESTRIL) 5 MG tablet Take 1 tablet (5 mg total) by mouth daily. 90 tablet 3  . metoprolol tartrate (LOPRESSOR) 25 MG tablet Take 1 tablet (25 mg total) by mouth 2 (two) times daily. 180 tablet 3  . nitroGLYCERIN (NITROSTAT) 0.4 MG SL tablet Place 1 tablet (0.4 mg total) under the tongue every 5 (five) minutes x 3 doses as needed for chest pain. 25 tablet 2  . Probiotic Product (ALIGN) 4 MG CAPS Take 4 mg by mouth 2 (two) times daily.    . rosuvastatin (CRESTOR) 10 MG tablet Take 1 tablet (10 mg total) by mouth daily. 30 tablet 6   No current facility-administered medications for this visit.    Allergies:   Review of patient's allergies indicates no known allergies.     Social History:  The patient  reports that she has quit smoking. She does not have any smokeless tobacco history on file. She reports that she does not drink alcohol or use illicit drugs.   Family History:  The patient's family history includes Heart attack (age of onset: 65) in her father; Hyperlipidemia in her mother; Hypertension in her mother.    ROS:  Please see the history of present illness.   Otherwise, review of systems are positive for anxiety, chest discomfort, weight gain.   All other systems are reviewed and negative.    PHYSICAL EXAM: VS:  BP 118/70 mmHg  Pulse 78  Ht 5\' 5"  (1.651 m)  Wt 236 lb 3.2 oz (107.14 kg)  BMI 39.31 kg/m2 , BMI Body mass index is 39.31 kg/(m^2). GEN: Well nourished, well developed, in no acute distress HEENT: normal Neck: no JVD, carotid bruits, or masses Cardiac: RRR; no murmurs, rubs, or gallops,no edema  Respiratory:  clear to auscultation bilaterally, normal work of breathing GI: soft, nontender, nondistended, + BS MS: no deformity or atrophy Skin: warm and dry, no rash Neuro:  Strength and sensation are intact Psych: euthymic mood, full affect      Recent Labs: 04/06/2014: TSH 2.145 05/07/2014: ALT 51; Hemoglobin 12.9; Platelets 245 05/12/2014: Pro B Natriuretic peptide (BNP) 61.0 05/26/2014: BUN 22; Creatinine 0.63; Potassium 4.4; Sodium 136   Lipid Panel    Component Value Date/Time   CHOL 179 04/07/2014 0337   TRIG 125 04/07/2014 0337   HDL 33* 04/07/2014 0337   CHOLHDL 5.4 04/07/2014 0337   VLDL 25 04/07/2014 0337   LDLCALC 121* 04/07/2014 0337     Other studies Reviewed: Additional studies/ records that were reviewed today with results demonstrating: Cardiac cath from March 2016 revealed moderate disease in anomalous circumflex..   ASSESSMENT AND PLAN:  1. Coronary artery disease/inferior MI: No CHF symptoms. EF was back to normal. It's unclear to me why she feels so fatigued. We will increase isosorbide to 30 mg  daily. If her chest discomfort continues, we'll plan for coronary angiography with possible PCI. Her symptoms are not like her prior MI. I think some of this may be anxiety. Some of this may be related to the circumflex disease. 2. Obesity: She is 4 pounds higher than she was at her last visit. We spoke about the importance of a healthy diet and regular exercise. She is also frustrated by the weight gain. 3. Hyperlipidemia: She had elevated CK with high-dose atorvastatin. This came all the way back to normal with no statin. She has been restarted on Crestor. We'll have to see if she has any CK abnormality. If she fails  another statin, would have to consider PCSK9 inhibitor. 4. Hypertension: Blood pressure well controlled.   Current medicines are reviewed at length with the patient today.  The patient concerns regarding her medicines were addressed.  The following changes have been made:  Increase Imdur  Labs/ tests ordered today include:  No orders of the defined types were placed in this encounter.    Recommend 150 minutes/week of aerobic exercise Low fat, low carb, high fiber diet recommended  Disposition:   FU in 4 months   Teresita Madura., MD  07/06/2014 4:33 PM    [facility] [address], [place], [place]  [address] Phone: [phone]; Fax: [phone]

## 2014-07-06 NOTE — Telephone Encounter (Signed)
Signed ref for cardiac rehab placed in MR nurse fax box to be faxed to [facility]

## 2014-07-06 NOTE — Patient Instructions (Addendum)
Medication Instructions:  Your physician has recommended you make the following change in your medication:  INCREASE Isosorbide to 30mg  daily. An Rx has been sent to your pharmacy  Labwork: None   Testing/Procedures: None   Follow-Up: Your physician wants you to follow-up in: 4 months with Dr.Varanasi You will receive a reminder letter in the mail two months in advance. If you don't receive a letter, please call our office to schedule the follow-up appointment.   Any Other Special Instructions Will Be Listed Below (If Applicable).

## 2014-07-07 LAB — CK: Total CK: 74 U/L (ref 7–177)

## 2014-07-08 ENCOUNTER — Other Ambulatory Visit: Payer: Self-pay | Admitting: Interventional Cardiology

## 2014-07-20 ENCOUNTER — Encounter: Payer: BLUE CROSS/BLUE SHIELD | Attending: Interventional Cardiology | Admitting: *Deleted

## 2014-07-20 VITALS — Ht 66.25 in | Wt 237.8 lb

## 2014-07-20 DIAGNOSIS — I2111 ST elevation (STEMI) myocardial infarction involving right coronary artery: Secondary | ICD-10-CM | POA: Diagnosis not present

## 2014-07-20 DIAGNOSIS — Z9861 Coronary angioplasty status: Secondary | ICD-10-CM | POA: Diagnosis present

## 2014-07-20 NOTE — Progress Notes (Signed)
Cardiac Individual Treatment Plan  Patient Details  Name: Sheri [name] MRN: [id number] Date of Birth: [birth year] Referring Provider:  Jettie Booze, MD  Initial Encounter Date: Date: 07/20/14  Visit Diagnosis: ST elevation myocardial infarction involving right coronary artery  S/P PTCA (percutaneous transluminal coronary angioplasty)  Patient's Home Medications on Admission:  Current outpatient prescriptions:  .  aspirin 81 MG chewable tablet, Chew 1 tablet (81 mg total) by mouth daily., Disp: , Rfl:  .  clopidogrel (PLAVIX) 75 MG tablet, TAKE 1 TABLET (75 MG TOTAL) BY MOUTH DAILY., Disp: 30 tablet, Rfl: 5 .  isosorbide mononitrate (IMDUR) 30 MG 24 hr tablet, Take 1 tablet (30 mg total) by mouth daily., Disp: 90 tablet, Rfl: 3 .  lisinopril (PRINIVIL,ZESTRIL) 5 MG tablet, Take 1 tablet (5 mg total) by mouth daily., Disp: 90 tablet, Rfl: 3 .  metoprolol tartrate (LOPRESSOR) 25 MG tablet, Take 1 tablet (25 mg total) by mouth 2 (two) times daily., Disp: 180 tablet, Rfl: 3 .  nitroGLYCERIN (NITROSTAT) 0.4 MG SL tablet, Place 1 tablet (0.4 mg total) under the tongue every 5 (five) minutes x 3 doses as needed for chest pain., Disp: 25 tablet, Rfl: 2 .  Probiotic Product (ALIGN) 4 MG CAPS, Take 4 mg by mouth 2 (two) times daily., Disp: , Rfl:  .  rosuvastatin (CRESTOR) 10 MG tablet, Take 1 tablet (10 mg total) by mouth daily., Disp: 30 tablet, Rfl: 6  Past Medical History: Past Medical History  Diagnosis Date  . Hypertension     Tobacco Use: History  Smoking status  . Former Smoker  Smokeless tobacco  . Not on file    Labs: Recent Review Flowsheet Data    Labs for ITP Cardiac and Pulmonary Rehab Latest Ref Rng 04/06/2014 04/07/2014   Cholestrol 0 - 200 mg/dL - 179   LDLCALC 0 - 99 mg/dL - 121(H)   HDL >39 mg/dL - 33(L)   Trlycerides <150 mg/dL - 125   Hemoglobin A1c 4.8 - 5.6 % 5.6 -   TCO2 0 - 100 mmol/L 21 -       Exercise Target Goals: Date: 07/20/14  Exercise  Program Goal: Individual exercise prescription set with THRR, safety & activity barriers. Participant demonstrates ability to understand and report RPE using BORG scale, to self-measure pulse accurately, and to acknowledge the importance of the exercise prescription.  Exercise Prescription Goal: Starting with aerobic activity 30 plus minutes a day, 3 days per week for initial exercise prescription. Provide home exercise prescription and guidelines that participant acknowledges understanding prior to discharge.  Activity Barriers & Risk Stratification:     Activity Barriers & Risk Stratification - 07/20/14 1405    Activity Barriers & Risk Stratification   Activity Barriers None;Deconditioning   Risk Stratification High      6 Minute Walk:     6 Minute Walk      07/20/14 1514       6 Minute Walk   Phase Initial     Distance 1525 feet     Walk Time 6 minutes     Resting HR 67 bpm     Resting BP 138/80 mmHg     Max Ex. HR 106 bpm     Max Ex. BP 160/90 mmHg     RPE 9     Symptoms No        Initial Exercise Prescription:     Initial Exercise Prescription - 07/20/14 1500    Date of Initial Exercise Prescription  Date 07/20/14   Treadmill   MPH 2.8   Grade 0   Minutes 15   Bike   Level 1.5   Minutes 15   Recumbant Bike   Level 3   Watts 30   Minutes 15   NuStep   Level 4   Watts 40   Minutes 15   Arm Ergometer   Level 2   Watts 10   Minutes 15   Arm/Foot Ergometer   Level 2   Watts 15   Minutes 15   Cybex   Level 2   RPM 40   Minutes 15   Recumbant Elliptical   Level 2   Watts 30   Minutes 15   Elliptical   Level 1   Speed 3   Minutes 10   REL-XR   Level 3   Watts 30   Minutes 15   Prescription Details   Frequency (times per week) 3   Duration Progress to 30 minutes of continuous aerobic without signs/symptoms of physical distress   Intensity   THRR REST +  30   Ratings of Perceived Exertion 11-13   Progression Continue progressive  overload as per policy without signs/symptoms or physical distress.   Resistance Training   Training Prescription Yes   Weight 2   Reps 10-12      Exercise Prescription Changes:   Discharge Exercise Prescription (Final Exercise Prescription Changes):   Nutrition:  Target Goals: Understanding of nutrition guidelines, daily intake of sodium 1500mg , cholesterol 200mg , calories 30% from fat and 7% or less from saturated fats, daily to have 5 or more servings of fruits and vegetables.  Biometrics:     Pre Biometrics - 07/20/14 1522    Pre Biometrics   Height 5' 6.25" (1.683 m)   Weight 237 lb 12.8 oz (107.865 kg)   Waist Circumference 41 inches   Hip Circumference 51.5 inches   Waist to Hip Ratio 0.8 %   BMI (Calculated) 38.2       Nutrition Therapy Plan and Nutrition Goals:   Nutrition Discharge: Rate Your Plate Scores:   Nutrition Goals Re-Evaluation:   Psychosocial: Target Goals: Acknowledge presence or absence of depression, maximize coping skills, provide positive support system. Participant is able to verbalize types and ability to use techniques and skills needed for reducing stress and depression.  Initial Review & Psychosocial Screening:   Quality of Life Scores:   PHQ-9:     Recent Review Flowsheet Data    Depression screen [facility] 2/9 07/20/2014   Decreased Interest 0   Down, Depressed, Hopeless 1   PHQ - 2 Score 1   Altered sleeping 1   Tired, decreased energy 3   Change in appetite 2   Feeling bad or failure about yourself  2   Trouble concentrating 3   Moving slowly or fidgety/restless 0   Suicidal thoughts 0   PHQ-9 Score 12   Difficult doing work/chores Somewhat difficult      Psychosocial Evaluation and Intervention:   Psychosocial Re-Evaluation:   Vocational Rehabilitation: Provide vocational rehab assistance to qualifying candidates.   Vocational Rehab Evaluation & Intervention:     Vocational Rehab - 07/20/14 1405     Initial Vocational Rehab Evaluation & Intervention   Assessment shows need for Vocational Rehabilitation No      Education: Education Goals: Education classes will be provided on a weekly basis, covering required topics. Participant will state understanding/return demonstration of topics presented.  Learning Barriers/Preferences:  Learning Barriers/Preferences - 07/20/14 1405    Learning Barriers/Preferences   Learning Barriers None   Learning Preferences None      Education Topics: General Nutrition Guidelines/Fats and Fiber: -Group instruction provided by verbal, written material, models and posters to present the general guidelines for heart healthy nutrition. Gives an explanation and review of dietary fats and fiber.   Controlling Sodium/Reading Food Labels: -Group verbal and written material supporting the discussion of sodium use in heart healthy nutrition. Review and explanation with models, verbal and written materials for utilization of the food label.   Exercise Physiology & Risk Factors: - Group verbal and written instruction with models to review the exercise physiology of the cardiovascular system and associated critical values. Details cardiovascular disease risk factors and the goals associated with each risk factor.   Aerobic Exercise & Resistance Training: - Gives group verbal and written discussion on the health impact of inactivity. On the components of aerobic and resistive training programs and the benefits of this training and how to safely progress through these programs.   Flexibility, Balance, General Exercise Guidelines: - Provides group verbal and written instruction on the benefits of flexibility and balance training programs. Provides general exercise guidelines with specific guidelines to those with heart or lung disease. Demonstration and skill practice provided.   Stress Management: - Provides group verbal and written instruction about the  health risks of elevated stress, cause of high stress, and healthy ways to reduce stress.   Depression: - Provides group verbal and written instruction on the correlation between heart/lung disease and depressed mood, treatment options, and the stigmas associated with seeking treatment.   Anatomy & Physiology of the Heart: - Group verbal and written instruction and models provide basic cardiac anatomy and physiology, with the coronary electrical and arterial systems. Review of: AMI, Angina, Valve disease, Heart Failure, Cardiac Arrhythmia, Pacemakers, and the ICD.   Cardiac Procedures: - Group verbal and written instruction and models to describe the testing methods done to diagnose heart disease. Reviews the outcomes of the test results. Describes the treatment choices: Medical Management, Angioplasty, or Coronary Bypass Surgery.   Cardiac Medications: - Group verbal and written instruction to review commonly prescribed medications for heart disease. Reviews the medication, class of the drug, and side effects. Includes the steps to properly store meds and maintain the prescription regimen.   Go Sex-Intimacy & Heart Disease, Get SMART - Goal Setting: - Group verbal and written instruction through game format to discuss heart disease and the return to sexual intimacy. Provides group verbal and written material to discuss and apply goal setting through the application of the S.M.A.R.T. Method.   Other Matters of the Heart: - Provides group verbal, written materials and models to describe Heart Failure, Angina, Valve Disease, and Diabetes in the realm of heart disease. Includes description of the disease process and treatment options available to the cardiac patient.   Exercise & Equipment Safety: - Individual verbal instruction and demonstration of equipment use and safety with use of the equipment.   Infection Prevention: - Provides verbal and written material to individual with  discussion of infection control including proper hand washing and proper equipment cleaning during exercise session.   Falls Prevention: - Provides verbal and written material to individual with discussion of falls prevention and safety.   Diabetes: - Individual verbal and written instruction to review signs/symptoms of diabetes, desired ranges of glucose level fasting, after meals and with exercise. Advice that pre and post exercise glucose checks  will be done for 3 sessions at entry of program.    Knowledge Questionnaire Score:   Personal Goals and Risk Factors at Admission:     Personal Goals and Risk Factors at Admission - 07/20/14 1406    Personal Goals and Risk Factors on Admission    Weight Management Obesity;Yes   Intervention Learn and follow the exercise and diet guidelines while in the program. Utilize the nutrition and education classes to help gain knowledge of the diet and exercise expectations in the program   Intervention Provide weight management tools through evaluation completed by registered dietician and exercise physiologist.  Establish a goal weight with participant.   Increase Aerobic Exercise and Physical Activity Yes   Intervention While in program, learn and follow the exercise prescription taught. Start at a low level workload and increase workload after able to maintain previous level for 30 minutes. Increase time before increasing intensity.   Understand more about Heart/Pulmonary Disease. Yes   Intervention While in program utilize professionals for any questions, and attend the education sessions. Great websites to use are www.americanheart.org or www.lung.org for reliable information.   Diabetes No   Hypertension Yes   Goal Participant will see blood pressure controlled within the values of 140/98mm/Hg or within value directed by their physician.   Intervention Provide nutrition & aerobic exercise along with prescribed medications to achieve BP 140/90 or  less.   Lipids Yes   Goal Cholesterol controlled with medications as prescribed, with individualized exercise RX and with personalized nutrition plan. Value goals: LDL < 70mg , HDL > 40mg . Participant states understanding of desired cholesterol values and following prescriptions.   Intervention Provide nutrition & aerobic exercise along with prescribed medications to achieve LDL 70mg , HDL >40mg .   Stress Yes   Goal To meet with psychosocial counselor for stress and relaxation information and guidance. To state understanding of performing relaxation techniques and or identifying personal stressors.   Intervention Provide education on types of stress, identifiying stressors, and ways to cope with stress. Provide demonstration and active practice of relaxation techniques.      Personal Goals and Risk Factors Review:    Personal Goals Discharge:     Comments: Initial ITP

## 2014-07-20 NOTE — Patient Instructions (Signed)
Patient Instructions  Patient Details  Name: Sheri [name] MRN: [id number] Date of Birth: July 03, 1965 Referring Provider:  Jettie Booze, MD  Below are the personal goals you chose as well as exercise and nutrition goals. Our goal is to help you keep on track towards obtaining and maintaining your goals. We will be discussing your progress on these goals with you throughout the program.  Initial Exercise Prescription:     Initial Exercise Prescription - 07/20/14 1500    Date of Initial Exercise Prescription   Date 07/20/14   Treadmill   MPH 2.8   Grade 0   Minutes 15   Bike   Level 1.5   Minutes 15   Recumbant Bike   Level 3   Watts 30   Minutes 15   NuStep   Level 4   Watts 40   Minutes 15   Arm Ergometer   Level 2   Watts 10   Minutes 15   Arm/Foot Ergometer   Level 2   Watts 15   Minutes 15   Cybex   Level 2   RPM 40   Minutes 15   Recumbant Elliptical   Level 2   Watts 30   Minutes 15   Elliptical   Level 1   Speed 3   Minutes 10   REL-XR   Level 3   Watts 30   Minutes 15   Prescription Details   Frequency (times per week) 3   Duration Progress to 30 minutes of continuous aerobic without signs/symptoms of physical distress   Intensity   THRR REST +  30   Ratings of Perceived Exertion 11-13   Progression Continue progressive overload as per policy without signs/symptoms or physical distress.   Resistance Training   Training Prescription Yes   Weight 2   Reps 10-12      Exercise Goals: Frequency: Be able to perform aerobic exercise three times per week working toward 3-5 days per week.  Intensity: Work with a perceived exertion of 11 (fairly light) - 15 (hard) as tolerated. Follow your new exercise prescription and watch for changes in prescription as you progress with the program. Changes will be reviewed with you when they are made.  Duration: You should be able to do 30 minutes of continuous aerobic exercise in addition to a 5 minute  warm-up and a 5 minute cool-down routine.  Nutrition Goals: Your personal nutrition goals will be established when you do your nutrition analysis with the dietician.  The following are nutrition guidelines to follow: Cholesterol < 200mg /day Sodium < 1500mg /day Fiber: Women over 50 yrs - 21 grams per day  Personal Goals:     Personal Goals and Risk Factors at Admission - 07/20/14 1406    Personal Goals and Risk Factors on Admission    Weight Management Obesity;Yes   Intervention Learn and follow the exercise and diet guidelines while in the program. Utilize the nutrition and education classes to help gain knowledge of the diet and exercise expectations in the program   Intervention Provide weight management tools through evaluation completed by registered dietician and exercise physiologist.  Establish a goal weight with participant.   Increase Aerobic Exercise and Physical Activity Yes   Intervention While in program, learn and follow the exercise prescription taught. Start at a low level workload and increase workload after able to maintain previous level for 30 minutes. Increase time before increasing intensity.   Understand more about Heart/Pulmonary Disease. Yes  Intervention While in program utilize professionals for any questions, and attend the education sessions. Great websites to use are www.americanheart.org or www.lung.org for reliable information.   Diabetes No   Hypertension Yes   Goal Participant will see blood pressure controlled within the values of 140/47mm/Hg or within value directed by their physician.   Intervention Provide nutrition & aerobic exercise along with prescribed medications to achieve BP 140/90 or less.   Lipids Yes   Goal Cholesterol controlled with medications as prescribed, with individualized exercise RX and with personalized nutrition plan. Value goals: LDL < 70mg , HDL > 40mg . Participant states understanding of desired cholesterol values and following  prescriptions.   Intervention Provide nutrition & aerobic exercise along with prescribed medications to achieve LDL 70mg , HDL >40mg .   Stress Yes   Goal To meet with psychosocial counselor for stress and relaxation information and guidance. To state understanding of performing relaxation techniques and or identifying personal stressors.   Intervention Provide education on types of stress, identifiying stressors, and ways to cope with stress. Provide demonstration and active practice of relaxation techniques.      Tobacco Use Initial Evaluation: History  Smoking status  . Former Smoker  Smokeless tobacco  . Not on file    Copy of goals given to participant.

## 2014-07-21 ENCOUNTER — Encounter: Payer: BLUE CROSS/BLUE SHIELD | Admitting: *Deleted

## 2014-07-21 ENCOUNTER — Encounter: Payer: Self-pay | Admitting: *Deleted

## 2014-07-21 DIAGNOSIS — I2111 ST elevation (STEMI) myocardial infarction involving right coronary artery: Secondary | ICD-10-CM

## 2014-07-21 DIAGNOSIS — Z9861 Coronary angioplasty status: Secondary | ICD-10-CM

## 2014-07-21 NOTE — Progress Notes (Signed)
Daily Session Note  Patient Details  Name: Sheri [name] MRN: [id number] Date of Birth: 04-Dec-1965 Referring Provider:  Jettie Booze, MD  Encounter Date: 07/21/2014  Check In:     Session Check In - 07/21/14 0858    Check-In   Staff Present Diane Joya Gaskins RN, BSN;Harika Laidlaw Dillard Essex MS, ACSM CEP Exercise Physiologist   ER physicians immediately available to respond to emergencies See telemetry face sheet for immediately available ER MD   Medication changes reported     No   Fall or balance concerns reported    No   Warm-up and Cool-down Performed on first and last piece of equipment   VAD Patient? No   Pain Assessment   Currently in Pain? No/denies   Multiple Pain Sites No         Goals Met:  Independence with exercise equipment Exercise tolerated well Personal goals reviewed No report of cardiac concerns or symptoms Strength training completed today  Goals Unmet:  Not Applicable  Goals Comments:    Dr. Emily Filbert is Medical Director for [facility].

## 2014-07-21 NOTE — Progress Notes (Signed)
Cardiac Individual Treatment Plan  Patient Details  Name: Sheri [name] MRN: [id number] Date of Birth: 06/11/1965 Referring Provider:  Dr. Lendell Caprice Initial Encounter Date:  07/20/2014  Visit Diagnosis: S/P PTCA (percutaneous transluminal coronary angioplasty)  ST elevation myocardial infarction involving right coronary artery  Patient's Home Medications on Admission:  Current outpatient prescriptions:  .  aspirin 81 MG chewable tablet, Chew 1 tablet (81 mg total) by mouth daily., Disp: , Rfl:  .  clopidogrel (PLAVIX) 75 MG tablet, TAKE 1 TABLET (75 MG TOTAL) BY MOUTH DAILY., Disp: 30 tablet, Rfl: 5 .  isosorbide mononitrate (IMDUR) 30 MG 24 hr tablet, Take 1 tablet (30 mg total) by mouth daily., Disp: 90 tablet, Rfl: 3 .  lisinopril (PRINIVIL,ZESTRIL) 5 MG tablet, Take 1 tablet (5 mg total) by mouth daily., Disp: 90 tablet, Rfl: 3 .  metoprolol tartrate (LOPRESSOR) 25 MG tablet, Take 1 tablet (25 mg total) by mouth 2 (two) times daily., Disp: 180 tablet, Rfl: 3 .  nitroGLYCERIN (NITROSTAT) 0.4 MG SL tablet, Place 1 tablet (0.4 mg total) under the tongue every 5 (five) minutes x 3 doses as needed for chest pain., Disp: 25 tablet, Rfl: 2 .  Probiotic Product (ALIGN) 4 MG CAPS, Take 4 mg by mouth 2 (two) times daily., Disp: , Rfl:  .  rosuvastatin (CRESTOR) 10 MG tablet, Take 1 tablet (10 mg total) by mouth daily., Disp: 30 tablet, Rfl: 6  Past Medical History: Past Medical History  Diagnosis Date  . Hypertension     Tobacco Use: History  Smoking status  . Former Smoker  Smokeless tobacco  . Not on file    Labs: Recent Review Flowsheet Data    Labs for ITP Cardiac and Pulmonary Rehab Latest Ref Rng 04/06/2014 04/07/2014   Cholestrol 0 - 200 mg/dL - 179   LDLCALC 0 - 99 mg/dL - 121(H)   HDL >39 mg/dL - 33(L)   Trlycerides <150 mg/dL - 125   Hemoglobin A1c 4.8 - 5.6 % 5.6 -   TCO2 0 - 100 mmol/L 21 -       Exercise Target Goals:    Exercise Program Goal: Individual  exercise prescription set with THRR, safety & activity barriers. Participant demonstrates ability to understand and report RPE using BORG scale, to self-measure pulse accurately, and to acknowledge the importance of the exercise prescription.  Exercise Prescription Goal: Starting with aerobic activity 30 plus minutes a day, 3 days per week for initial exercise prescription. Provide home exercise prescription and guidelines that participant acknowledges understanding prior to discharge.  Activity Barriers & Risk Stratification:     Activity Barriers & Risk Stratification - 07/20/14 1405    Activity Barriers & Risk Stratification   Activity Barriers None;Deconditioning   Risk Stratification High      6 Minute Walk:     6 Minute Walk      07/20/14 1514       6 Minute Walk   Phase Initial     Distance 1525 feet     Walk Time 6 minutes     Resting HR 67 bpm     Resting BP 138/80 mmHg     Max Ex. HR 106 bpm     Max Ex. BP 160/90 mmHg     RPE 9     Symptoms No        Initial Exercise Prescription:     Initial Exercise Prescription - 07/20/14 1500    Date of Initial Exercise Prescription  Date 07/20/14   Treadmill   MPH 2.8   Grade 0   Minutes 15   Bike   Level 1.5   Minutes 15   Recumbant Bike   Level 3   Watts 30   Minutes 15   NuStep   Level 4   Watts 40   Minutes 15   Arm Ergometer   Level 2   Watts 10   Minutes 15   Arm/Foot Ergometer   Level 2   Watts 15   Minutes 15   Cybex   Level 2   RPM 40   Minutes 15   Recumbant Elliptical   Level 2   Watts 30   Minutes 15   Elliptical   Level 1   Speed 3   Minutes 10   REL-XR   Level 3   Watts 30   Minutes 15   Prescription Details   Frequency (times per week) 3   Duration Progress to 30 minutes of continuous aerobic without signs/symptoms of physical distress   Intensity   THRR REST +  30   Ratings of Perceived Exertion 11-13   Progression Continue progressive overload as per policy without  signs/symptoms or physical distress.   Resistance Training   Training Prescription Yes   Weight 2   Reps 10-12      Exercise Prescription Changes:   Discharge Exercise Prescription (Final Exercise Prescription Changes):   Nutrition:  Target Goals: Understanding of nutrition guidelines, daily intake of sodium 1500mg , cholesterol 200mg , calories 30% from fat and 7% or less from saturated fats, daily to have 5 or more servings of fruits and vegetables.  Biometrics:     Pre Biometrics - 07/20/14 1522    Pre Biometrics   Height 5' 6.25" (1.683 m)   Weight 237 lb 12.8 oz (107.865 kg)   Waist Circumference 41 inches   Hip Circumference 51.5 inches   Waist to Hip Ratio 0.8 %   BMI (Calculated) 38.2       Nutrition Therapy Plan and Nutrition Goals:   Nutrition Discharge: Rate Your Plate Scores:   Nutrition Goals Re-Evaluation:   Psychosocial: Target Goals: Acknowledge presence or absence of depression, maximize coping skills, provide positive support system. Participant is able to verbalize types and ability to use techniques and skills needed for reducing stress and depression.  Initial Review & Psychosocial Screening:   Quality of Life Scores:   PHQ-9:     Recent Review Flowsheet Data    Depression screen [facility] 2/9 07/20/2014   Decreased Interest 0   Down, Depressed, Hopeless 1   PHQ - 2 Score 1   Altered sleeping 1   Tired, decreased energy 3   Change in appetite 2   Feeling bad or failure about yourself  2   Trouble concentrating 3   Moving slowly or fidgety/restless 0   Suicidal thoughts 0   PHQ-9 Score 12   Difficult doing work/chores Somewhat difficult      Psychosocial Evaluation and Intervention:   Psychosocial Re-Evaluation:   Vocational Rehabilitation: Provide vocational rehab assistance to qualifying candidates.   Vocational Rehab Evaluation & Intervention:     Vocational Rehab - 07/20/14 1405    Initial Vocational Rehab Evaluation  & Intervention   Assessment shows need for Vocational Rehabilitation No      Education: Education Goals: Education classes will be provided on a weekly basis, covering required topics. Participant will state understanding/return demonstration of topics presented.  Learning Barriers/Preferences:  Learning Barriers/Preferences - 07/20/14 1405    Learning Barriers/Preferences   Learning Barriers None   Learning Preferences None      Education Topics: General Nutrition Guidelines/Fats and Fiber: -Group instruction provided by verbal, written material, models and posters to present the general guidelines for heart healthy nutrition. Gives an explanation and review of dietary fats and fiber.   Controlling Sodium/Reading Food Labels: -Group verbal and written material supporting the discussion of sodium use in heart healthy nutrition. Review and explanation with models, verbal and written materials for utilization of the food label.   Exercise Physiology & Risk Factors: - Group verbal and written instruction with models to review the exercise physiology of the cardiovascular system and associated critical values. Details cardiovascular disease risk factors and the goals associated with each risk factor.   Aerobic Exercise & Resistance Training: - Gives group verbal and written discussion on the health impact of inactivity. On the components of aerobic and resistive training programs and the benefits of this training and how to safely progress through these programs.   Flexibility, Balance, General Exercise Guidelines: - Provides group verbal and written instruction on the benefits of flexibility and balance training programs. Provides general exercise guidelines with specific guidelines to those with heart or lung disease. Demonstration and skill practice provided.   Stress Management: - Provides group verbal and written instruction about the health risks of elevated stress, cause  of high stress, and healthy ways to reduce stress.   Depression: - Provides group verbal and written instruction on the correlation between heart/lung disease and depressed mood, treatment options, and the stigmas associated with seeking treatment.   Anatomy & Physiology of the Heart: - Group verbal and written instruction and models provide basic cardiac anatomy and physiology, with the coronary electrical and arterial systems. Review of: AMI, Angina, Valve disease, Heart Failure, Cardiac Arrhythmia, Pacemakers, and the ICD.   Cardiac Procedures: - Group verbal and written instruction and models to describe the testing methods done to diagnose heart disease. Reviews the outcomes of the test results. Describes the treatment choices: Medical Management, Angioplasty, or Coronary Bypass Surgery.   Cardiac Medications: - Group verbal and written instruction to review commonly prescribed medications for heart disease. Reviews the medication, class of the drug, and side effects. Includes the steps to properly store meds and maintain the prescription regimen.   Go Sex-Intimacy & Heart Disease, Get SMART - Goal Setting: - Group verbal and written instruction through game format to discuss heart disease and the return to sexual intimacy. Provides group verbal and written material to discuss and apply goal setting through the application of the S.M.A.R.T. Method.   Other Matters of the Heart: - Provides group verbal, written materials and models to describe Heart Failure, Angina, Valve Disease, and Diabetes in the realm of heart disease. Includes description of the disease process and treatment options available to the cardiac patient.   Exercise & Equipment Safety: - Individual verbal instruction and demonstration of equipment use and safety with use of the equipment.   Infection Prevention: - Provides verbal and written material to individual with discussion of infection control including  proper hand washing and proper equipment cleaning during exercise session.   Falls Prevention: - Provides verbal and written material to individual with discussion of falls prevention and safety.   Diabetes: - Individual verbal and written instruction to review signs/symptoms of diabetes, desired ranges of glucose level fasting, after meals and with exercise. Advice that pre and post exercise glucose checks  will be done for 3 sessions at entry of program.    Knowledge Questionnaire Score:   Personal Goals and Risk Factors at Admission:     Personal Goals and Risk Factors at Admission - 07/20/14 1406    Personal Goals and Risk Factors on Admission    Weight Management Obesity;Yes   Intervention Learn and follow the exercise and diet guidelines while in the program. Utilize the nutrition and education classes to help gain knowledge of the diet and exercise expectations in the program   Intervention Provide weight management tools through evaluation completed by registered dietician and exercise physiologist.  Establish a goal weight with participant.   Increase Aerobic Exercise and Physical Activity Yes   Intervention While in program, learn and follow the exercise prescription taught. Start at a low level workload and increase workload after able to maintain previous level for 30 minutes. Increase time before increasing intensity.   Understand more about Heart/Pulmonary Disease. Yes   Intervention While in program utilize professionals for any questions, and attend the education sessions. Great websites to use are www.americanheart.org or www.lung.org for reliable information.   Diabetes No   Hypertension Yes   Goal Participant will see blood pressure controlled within the values of 140/47mm/Hg or within value directed by their physician.   Intervention Provide nutrition & aerobic exercise along with prescribed medications to achieve BP 140/90 or less.   Lipids Yes   Goal Cholesterol  controlled with medications as prescribed, with individualized exercise RX and with personalized nutrition plan. Value goals: LDL < 70mg , HDL > 40mg . Participant states understanding of desired cholesterol values and following prescriptions.   Intervention Provide nutrition & aerobic exercise along with prescribed medications to achieve LDL 70mg , HDL >40mg .   Stress Yes   Goal To meet with psychosocial counselor for stress and relaxation information and guidance. To state understanding of performing relaxation techniques and or identifying personal stressors.   Intervention Provide education on types of stress, identifiying stressors, and ways to cope with stress. Provide demonstration and active practice of relaxation techniques.      Personal Goals and Risk Factors Review:    Personal Goals Discharge:     Comments: 30 day review. Continue with ITP.  New start to program with one session attended.

## 2014-07-22 ENCOUNTER — Other Ambulatory Visit: Payer: Self-pay | Admitting: *Deleted

## 2014-07-22 DIAGNOSIS — Z9861 Coronary angioplasty status: Secondary | ICD-10-CM

## 2014-07-23 DIAGNOSIS — Z9861 Coronary angioplasty status: Secondary | ICD-10-CM

## 2014-07-23 DIAGNOSIS — I2111 ST elevation (STEMI) myocardial infarction involving right coronary artery: Secondary | ICD-10-CM | POA: Diagnosis not present

## 2014-07-23 NOTE — Progress Notes (Signed)
Daily Session Note  Patient Details  Name: Sheri [name] MRN: [id number] Date of Birth: 02-19-1965 Referring Provider:  Jettie Booze, MD  Encounter Date: 07/23/2014  Check In:     Session Check In - 07/23/14 0957    Check-In   Staff Present Lestine Box BS, ACSM EP-C, Exercise Physiologist;Carroll Enterkin RN, BSN   ER physicians immediately available to respond to emergencies See telemetry face sheet for immediately available ER MD   Medication changes reported     No   Fall or balance concerns reported    No   Warm-up and Cool-down Performed on first and last piece of equipment   VAD Patient? No   Pain Assessment   Currently in Pain? No/denies         Goals Met:  Proper associated with RPD/PD & O2 Sat Exercise tolerated well No report of cardiac concerns or symptoms Strength training completed today  Goals Unmet:  Not Applicable  Goals Comments:    Dr. Emily Filbert is Medical Director for [facility] and [facility].

## 2014-07-27 ENCOUNTER — Other Ambulatory Visit (INDEPENDENT_AMBULATORY_CARE_PROVIDER_SITE_OTHER): Payer: BLUE CROSS/BLUE SHIELD | Admitting: *Deleted

## 2014-07-27 DIAGNOSIS — E785 Hyperlipidemia, unspecified: Secondary | ICD-10-CM

## 2014-07-27 LAB — HEPATIC FUNCTION PANEL
ALT: 19 U/L (ref 0–35)
AST: 16 U/L (ref 0–37)
Albumin: 4 g/dL (ref 3.5–5.2)
Alkaline Phosphatase: 85 U/L (ref 39–117)
Bilirubin, Direct: 0 mg/dL (ref 0.0–0.3)
Total Bilirubin: 0.5 mg/dL (ref 0.2–1.2)
Total Protein: 7.1 g/dL (ref 6.0–8.3)

## 2014-07-27 LAB — LIPID PANEL
Cholesterol: 130 mg/dL (ref 0–200)
HDL: 29.1 mg/dL — ABNORMAL LOW (ref >39.00)
LDL Cholesterol: 76 mg/dL (ref 0–99)
NonHDL: 100.9
Total CHOL/HDL Ratio: 4
Triglycerides: 124 mg/dL (ref 0.0–149.0)
VLDL: 24.8 mg/dL (ref 0.0–40.0)

## 2014-07-27 LAB — CK: Total CK: 41 U/L (ref 7–177)

## 2014-07-28 ENCOUNTER — Encounter: Payer: BLUE CROSS/BLUE SHIELD | Admitting: *Deleted

## 2014-07-28 ENCOUNTER — Other Ambulatory Visit: Payer: Self-pay

## 2014-07-28 DIAGNOSIS — E669 Obesity, unspecified: Secondary | ICD-10-CM

## 2014-07-28 DIAGNOSIS — I2111 ST elevation (STEMI) myocardial infarction involving right coronary artery: Secondary | ICD-10-CM | POA: Diagnosis not present

## 2014-07-28 DIAGNOSIS — R748 Abnormal levels of other serum enzymes: Secondary | ICD-10-CM

## 2014-07-28 DIAGNOSIS — E785 Hyperlipidemia, unspecified: Secondary | ICD-10-CM

## 2014-07-28 DIAGNOSIS — Z9861 Coronary angioplasty status: Secondary | ICD-10-CM

## 2014-07-28 NOTE — Progress Notes (Signed)
Daily Session Note  Patient Details  Name: Sheri [name] MRN: [id number] Date of Birth: [birth year] Referring Provider:  Jettie Booze, MD  Encounter Date: 07/28/2014  Check In:     Session Check In - 07/28/14 0858    Check-In   Staff Present Candiss Norse MS, ACSM CEP Exercise Physiologist;Diane Mariana Arn, BSN   ER physicians immediately available to respond to emergencies See telemetry face sheet for immediately available ER MD   Medication changes reported     No   Fall or balance concerns reported    No   Warm-up and Cool-down Performed on first and last piece of equipment   VAD Patient? No   Pain Assessment   Currently in Pain? No/denies   Multiple Pain Sites No         Goals Met:  Independence with exercise equipment Exercise tolerated well No report of cardiac concerns or symptoms Strength training completed today  Goals Unmet:  Not Applicable  Goals Comments:   Dr. Emily Filbert is Medical Director for [facility] and [facility].

## 2014-08-04 ENCOUNTER — Encounter: Payer: BLUE CROSS/BLUE SHIELD | Attending: Interventional Cardiology | Admitting: *Deleted

## 2014-08-04 DIAGNOSIS — Z9861 Coronary angioplasty status: Secondary | ICD-10-CM

## 2014-08-04 DIAGNOSIS — I2111 ST elevation (STEMI) myocardial infarction involving right coronary artery: Secondary | ICD-10-CM

## 2014-08-04 NOTE — Progress Notes (Signed)
Daily Session Note  Patient Details  Name: Sheri [name] MRN: [id number] Date of Birth: 06/09/1965 Referring Provider:  Varanasi, Jayadeep S, MD  Encounter Date: 08/04/2014  Check In:     Session Check In - 08/04/14 1026    Check-In   Staff Present Renee MacMillan MS, ACSM CEP Exercise Physiologist;Diane Wright RN, BSN   ER physicians immediately available to respond to emergencies See telemetry face sheet for immediately available ER MD   Medication changes reported     No   Fall or balance concerns reported    No   Warm-up and Cool-down Performed on first and last piece of equipment   VAD Patient? No   Pain Assessment   Currently in Pain? No/denies   Multiple Pain Sites No         Goals Met:  Exercise tolerated well No report of cardiac concerns or symptoms Strength training completed today  Goals Unmet:  Not Applicable  Goals Comments:     Dr. Mark Miller is Medical Director for [facility] and [facility]. 

## 2014-08-11 ENCOUNTER — Encounter: Payer: BLUE CROSS/BLUE SHIELD | Admitting: *Deleted

## 2014-08-11 ENCOUNTER — Encounter: Payer: Self-pay | Admitting: *Deleted

## 2014-08-11 DIAGNOSIS — Z9861 Coronary angioplasty status: Secondary | ICD-10-CM

## 2014-08-11 DIAGNOSIS — I2111 ST elevation (STEMI) myocardial infarction involving right coronary artery: Secondary | ICD-10-CM | POA: Diagnosis not present

## 2014-08-11 NOTE — Progress Notes (Signed)
Daily Session Note  Patient Details  Name: Sheri [name] MRN: [id number] Date of Birth: 12/26/1965 Referring Provider:  Varanasi, Jayadeep S, MD  Encounter Date: 08/11/2014  Check In:     Session Check In - 08/11/14 0857    Check-In   Staff Present Diane Wright RN, BSN;Renee MacMillan MS, ACSM CEP Exercise Physiologist   ER physicians immediately available to respond to emergencies See telemetry face sheet for immediately available ER MD   Medication changes reported     No   Fall or balance concerns reported    No   Warm-up and Cool-down Performed on first and last piece of equipment   VAD Patient? No   Pain Assessment   Currently in Pain? No/denies   Multiple Pain Sites No           Exercise Prescription Changes - 08/11/14 0600    Exercise Review   Progression Yes   Response to Exercise   Blood Pressure (Admit) 122/80 mmHg   Blood Pressure (Exercise) 152/70 mmHg   Blood Pressure (Exit) 132/78 mmHg   Heart Rate (Admit) 75 bpm   Heart Rate (Exercise) 127 bpm   Heart Rate (Exit) 74 bpm   Symptoms No   Duration Progress to 50 minutes of aerobic without signs/symptoms of physical distress   Intensity Other (comment)  40-85% HRR 114-159   Progression Continue progressive overload as per policy without signs/symptoms or physical distress.   Resistance Training   Training Prescription Yes   Weight 5   Reps 10-15   Interval Training   Interval Training Yes   Equipment Treadmill   Treadmill   MPH 3.2   Grade 2   Minutes 25   Elliptical   Level 2   Speed 3.5   Minutes 20      Goals Met:  Independence with exercise equipment Exercise tolerated well Personal goals reviewed No report of cardiac concerns or symptoms Strength training completed today  Goals Unmet:  Not Applicable  Goals Comments:   Dr. Mark Miller is Medical Director for [facility] and [facility]. 

## 2014-08-13 ENCOUNTER — Encounter: Payer: BLUE CROSS/BLUE SHIELD | Admitting: *Deleted

## 2014-08-13 DIAGNOSIS — Z9861 Coronary angioplasty status: Secondary | ICD-10-CM

## 2014-08-13 DIAGNOSIS — I2111 ST elevation (STEMI) myocardial infarction involving right coronary artery: Secondary | ICD-10-CM | POA: Diagnosis not present

## 2014-08-13 NOTE — Progress Notes (Signed)
Daily Session Note  Patient Details  Name: Sheri [name] MRN: [id number] Date of Birth: 1965-08-19 Referring Provider:  Jettie Booze, MD  Encounter Date: 08/13/2014  Check In:     Session Check In - 08/13/14 1102    Check-In   Staff Present Gerlene Burdock RN, BSN  Hessie Knows   ER physicians immediately available to respond to emergencies See telemetry face sheet for immediately available ER MD   Medication changes reported     No   Fall or balance concerns reported    No   Warm-up and Cool-down Performed on first and last piece of equipment   VAD Patient? No   Pain Assessment   Currently in Pain? No/denies         Goals Met:  Proper associated with RPD/PD & O2 Sat Exercise tolerated well  Goals Unmet:  Not Applicable  Goals Comments: Has met with dietician and is already healthy. Had exercise 3 hours/day before her heart attack. Discussed goals and losing inches v s lbs. Terry wants to get back to h   Dr. Emily Filbert is Medical Director for [place] and [facility].

## 2014-08-18 ENCOUNTER — Encounter: Payer: Self-pay | Admitting: *Deleted

## 2014-08-18 DIAGNOSIS — I2111 ST elevation (STEMI) myocardial infarction involving right coronary artery: Secondary | ICD-10-CM

## 2014-08-18 DIAGNOSIS — Z9861 Coronary angioplasty status: Secondary | ICD-10-CM

## 2014-08-18 NOTE — Progress Notes (Signed)
Cardiac Individual Treatment Plan  Patient Details  Name: Sheri [name] MRN: [id number] Date of Birth: March 31, 1965 Referring Provider:  Jettie Booze, MD  Initial Encounter Date:    Visit Diagnosis: S/P PTCA (percutaneous transluminal coronary angioplasty)  ST elevation myocardial infarction involving right coronary artery  Patient's Home Medications on Admission:  Current outpatient prescriptions:  .  aspirin 81 MG chewable tablet, Chew 1 tablet (81 mg total) by mouth daily., Disp: , Rfl:  .  clopidogrel (PLAVIX) 75 MG tablet, TAKE 1 TABLET (75 MG TOTAL) BY MOUTH DAILY., Disp: 30 tablet, Rfl: 5 .  isosorbide mononitrate (IMDUR) 30 MG 24 hr tablet, Take 1 tablet (30 mg total) by mouth daily., Disp: 90 tablet, Rfl: 3 .  lisinopril (PRINIVIL,ZESTRIL) 5 MG tablet, Take 1 tablet (5 mg total) by mouth daily., Disp: 90 tablet, Rfl: 3 .  metoprolol tartrate (LOPRESSOR) 25 MG tablet, Take 1 tablet (25 mg total) by mouth 2 (two) times daily., Disp: 180 tablet, Rfl: 3 .  nitroGLYCERIN (NITROSTAT) 0.4 MG SL tablet, Place 1 tablet (0.4 mg total) under the tongue every 5 (five) minutes x 3 doses as needed for chest pain., Disp: 25 tablet, Rfl: 2 .  Probiotic Product (ALIGN) 4 MG CAPS, Take 4 mg by mouth 2 (two) times daily., Disp: , Rfl:  .  rosuvastatin (CRESTOR) 10 MG tablet, Take 1 tablet (10 mg total) by mouth daily., Disp: 30 tablet, Rfl: 6  Past Medical History: Past Medical History  Diagnosis Date  . Hypertension     Tobacco Use: History  Smoking status  . Former Smoker  Smokeless tobacco  . Not on file    Labs: Recent Review Flowsheet Data    Labs for ITP Cardiac and Pulmonary Rehab Latest Ref Rng 04/06/2014 04/07/2014 07/27/2014   Cholestrol 0 - 200 mg/dL - 179 130   LDLCALC 0 - 99 mg/dL - 121(H) 76   HDL >39.00 mg/dL - 33(L) 29.10(L)   Trlycerides 0.0 - 149.0 mg/dL - 125 124.0   Hemoglobin A1c 4.8 - 5.6 % 5.6 - -   TCO2 0 - 100 mmol/L 21 - -       Exercise Target  Goals:    Exercise Program Goal: Individual exercise prescription set with THRR, safety & activity barriers. Participant demonstrates ability to understand and report RPE using BORG scale, to self-measure pulse accurately, and to acknowledge the importance of the exercise prescription.  Exercise Prescription Goal: Starting with aerobic activity 30 plus minutes a day, 3 days per week for initial exercise prescription. Provide home exercise prescription and guidelines that participant acknowledges understanding prior to discharge.  Activity Barriers & Risk Stratification:     Activity Barriers & Risk Stratification - 07/20/14 1405    Activity Barriers & Risk Stratification   Activity Barriers None;Deconditioning   Risk Stratification High      6 Minute Walk:     6 Minute Walk      07/20/14 1514       6 Minute Walk   Phase Initial     Distance 1525 feet     Walk Time 6 minutes     Resting HR 67 bpm     Resting BP 138/80 mmHg     Max Ex. HR 106 bpm     Max Ex. BP 160/90 mmHg     RPE 9     Symptoms No        Initial Exercise Prescription:     Initial Exercise Prescription - 07/20/14  1500    Date of Initial Exercise Prescription   Date 07/20/14   Treadmill   MPH 2.8   Grade 0   Minutes 15   Bike   Level 1.5   Minutes 15   Recumbant Bike   Level 3   Watts 30   Minutes 15   NuStep   Level 4   Watts 40   Minutes 15   Arm Ergometer   Level 2   Watts 10   Minutes 15   Arm/Foot Ergometer   Level 2   Watts 15   Minutes 15   Cybex   Level 2   RPM 40   Minutes 15   Recumbant Elliptical   Level 2   Watts 30   Minutes 15   Elliptical   Level 1   Speed 3   Minutes 10   REL-XR   Level 3   Watts 30   Minutes 15   Prescription Details   Frequency (times per week) 3   Duration Progress to 30 minutes of continuous aerobic without signs/symptoms of physical distress   Intensity   THRR REST +  30   Ratings of Perceived Exertion 11-13   Progression  Continue progressive overload as per policy without signs/symptoms or physical distress.   Resistance Training   Training Prescription Yes   Weight 2   Reps 10-12      Exercise Prescription Changes:     Exercise Prescription Changes      08/11/14 0600           Exercise Review   Progression Yes       Response to Exercise   Blood Pressure (Admit) 122/80 mmHg       Blood Pressure (Exercise) 152/70 mmHg       Blood Pressure (Exit) 132/78 mmHg       Heart Rate (Admit) 75 bpm       Heart Rate (Exercise) 127 bpm       Heart Rate (Exit) 74 bpm       Symptoms No       Duration Progress to 50 minutes of aerobic without signs/symptoms of physical distress       Intensity Other (comment)  40-85% HRR 114-159       Progression Continue progressive overload as per policy without signs/symptoms or physical distress.       Resistance Training   Training Prescription Yes       Weight 5       Reps 10-15       Interval Training   Interval Training Yes       Equipment Treadmill       Treadmill   MPH 3.2       Grade 2       Minutes 25       Elliptical   Level 2       Speed 3.5       Minutes 20          Discharge Exercise Prescription (Final Exercise Prescription Changes):     Exercise Prescription Changes - 08/11/14 0600    Exercise Review   Progression Yes   Response to Exercise   Blood Pressure (Admit) 122/80 mmHg   Blood Pressure (Exercise) 152/70 mmHg   Blood Pressure (Exit) 132/78 mmHg   Heart Rate (Admit) 75 bpm   Heart Rate (Exercise) 127 bpm   Heart Rate (Exit) 74 bpm   Symptoms No   Duration Progress to 50  minutes of aerobic without signs/symptoms of physical distress   Intensity Other (comment)  40-85% HRR 114-159   Progression Continue progressive overload as per policy without signs/symptoms or physical distress.   Resistance Training   Training Prescription Yes   Weight 5   Reps 10-15   Interval Training   Interval Training Yes   Equipment Treadmill    Treadmill   MPH 3.2   Grade 2   Minutes 25   Elliptical   Level 2   Speed 3.5   Minutes 20      Nutrition:  Target Goals: Understanding of nutrition guidelines, daily intake of sodium '1500mg'$ , cholesterol '200mg'$ , calories 30% from fat and 7% or less from saturated fats, daily to have 5 or more servings of fruits and vegetables.  Biometrics:     Pre Biometrics - 07/20/14 1522    Pre Biometrics   Height 5' 6.25" (1.683 m)   Weight 237 lb 12.8 oz (107.865 kg)   Waist Circumference 41 inches   Hip Circumference 51.5 inches   Waist to Hip Ratio 0.8 %   BMI (Calculated) 38.2       Nutrition Therapy Plan and Nutrition Goals:     Nutrition Therapy & Goals - 08/11/14 1817    Nutrition Therapy   Diet 1200kcal DASH   Drug/Food Interactions Statins/Certain Fruits   Fiber 25 grams   Whole Grain Foods 3 servings   Protein 6 ounces/day   Saturated Fats 10 max. grams   Fruits and Vegetables 8 servings/day   Personal Nutrition Goals   Personal Goal #1 follow 1200kcal goal for weight loss   Personal Goal #2 continue with healthy food choices      Nutrition Discharge: Rate Your Plate Scores:     Rate Your Plate - 58/83/25 4982    Rate Your Plate Scores   Pre Score 59   Pre Score % 66 %      Nutrition Goals Re-Evaluation:     Nutrition Goals Re-Evaluation      08/13/14 1108           Personal Goal #1 Re-Evaluation   Personal Goal #1 Is following the 1200 kcal diet.       Personal Goal #2 Re-Evaluation   Goal Progress Seen Yes          Psychosocial: Target Goals: Acknowledge presence or absence of depression, maximize coping skills, provide positive support system. Participant is able to verbalize types and ability to use techniques and skills needed for reducing stress and depression.  Initial Review & Psychosocial Screening:   Quality of Life Scores:   PHQ-9:     Recent Review Flowsheet Data    Depression screen [facility] 2/9 07/20/2014   Decreased  Interest 0   Down, Depressed, Hopeless 1   PHQ - 2 Score 1   Altered sleeping 1   Tired, decreased energy 3   Change in appetite 2   Feeling bad or failure about yourself  2   Trouble concentrating 3   Moving slowly or fidgety/restless 0   Suicidal thoughts 0   PHQ-9 Score 12   Difficult doing work/chores Somewhat difficult      Psychosocial Evaluation and Intervention:     Psychosocial Evaluation - 08/11/14 0937    Psychosocial Evaluation & Interventions   Interventions Therapist referral;Stress management education;Relaxation education;Encouraged to exercise with the program and follow exercise prescription   Comments Counselor met with Ms. Coglianese today for initial psychosocial evaluation.  She is a 49  year old who recently had a heart attacks and has a family history of heart disease.  Ms. Leidy has a strong support system with a spouse of 19 years.  She states she is sleeping okay and eating "too much."  She  reports a history of situational depression following the death of her father 18 years ago and some current symptoms since her heart attack.  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  Due to her current  stressors of health issues, daughter going off to college and her current symptoms, Counselor recommended a therapist and gave Ms. Fonder the contact information.  Counselor also assessed stress management techniques and practiced breathing techniques for relaxation as well as some visual imagery.  Ms. Bayer will benefit from meeting with the dietician to work on her weight loss goals.  She will also benefit from the psychoeducational components of this program as well as consistent exercise.     Continued Psychosocial Services Needed Yes  Ms. Segar will benefit from meeting with the dietician, participating in the psychoeducational components of this program - especially stress management, depression and relaxation techniques.  She was given contact  info for a local therapist.        Psychosocial Re-Evaluation:   Vocational Rehabilitation: Provide vocational rehab assistance to qualifying candidates.   Vocational Rehab Evaluation & Intervention:     Vocational Rehab - 07/20/14 1405    Initial Vocational Rehab Evaluation & Intervention   Assessment shows need for Vocational Rehabilitation No      Education: Education Goals: Education classes will be provided on a weekly basis, covering required topics. Participant will state understanding/return demonstration of topics presented.  Learning Barriers/Preferences:     Learning Barriers/Preferences - 07/20/14 1405    Learning Barriers/Preferences   Learning Barriers None   Learning Preferences None      Education Topics: General Nutrition Guidelines/Fats and Fiber: -Group instruction provided by verbal, written material, models and posters to present the general guidelines for heart healthy nutrition. Gives an explanation and review of dietary fats and fiber.          Cardiac Rehab from 08/13/2014 in [facility] Cardiac Rehab   Date  07/21/14   Educator  CR   Instruction Review Code  2- meets goals/outcomes      Controlling Sodium/Reading Food Labels: -Group verbal and written material supporting the discussion of sodium use in heart healthy nutrition. Review and explanation with models, verbal and written materials for utilization of the food label.      Cardiac Rehab from 08/13/2014 in [facility] Cardiac Rehab   Date  07/28/14   Educator  CR   Instruction Review Code  2- meets goals/outcomes      Exercise Physiology & Risk Factors: - Group verbal and written instruction with models to review the exercise physiology of the cardiovascular system and associated critical values. Details cardiovascular disease risk factors and the goals associated with each risk factor.      Cardiac Rehab from 08/13/2014 in [facility] Cardiac Rehab   Date  08/13/14   Educator  Hessie Knows       Aerobic Exercise & Resistance Training: - Gives group verbal and written discussion on the health impact of inactivity. On the components of aerobic and resistive training programs and the benefits of this training and how to safely progress through these programs.   Flexibility, Balance, General Exercise Guidelines: - Provides group verbal and written instruction on the  benefits of flexibility and balance training programs. Provides general exercise guidelines with specific guidelines to those with heart or lung disease. Demonstration and skill practice provided.   Stress Management: - Provides group verbal and written instruction about the health risks of elevated stress, cause of high stress, and healthy ways to reduce stress.   Depression: - Provides group verbal and written instruction on the correlation between heart/lung disease and depressed mood, treatment options, and the stigmas associated with seeking treatment.   Anatomy & Physiology of the Heart: - Group verbal and written instruction and models provide basic cardiac anatomy and physiology, with the coronary electrical and arterial systems. Review of: AMI, Angina, Valve disease, Heart Failure, Cardiac Arrhythmia, Pacemakers, and the ICD.   Cardiac Procedures: - Group verbal and written instruction and models to describe the testing methods done to diagnose heart disease. Reviews the outcomes of the test results. Describes the treatment choices: Medical Management, Angioplasty, or Coronary Bypass Surgery.   Cardiac Medications: - Group verbal and written instruction to review commonly prescribed medications for heart disease. Reviews the medication, class of the drug, and side effects. Includes the steps to properly store meds and maintain the prescription regimen.      Cardiac Rehab from 08/13/2014 in [facility] Cardiac Rehab   Date  08/11/14   Educator  DW   Instruction Review Code  2- meets goals/outcomes      Go  Sex-Intimacy & Heart Disease, Get SMART - Goal Setting: - Group verbal and written instruction through game format to discuss heart disease and the return to sexual intimacy. Provides group verbal and written material to discuss and apply goal setting through the application of the S.M.A.R.T. Method.   Other Matters of the Heart: - Provides group verbal, written materials and models to describe Heart Failure, Angina, Valve Disease, and Diabetes in the realm of heart disease. Includes description of the disease process and treatment options available to the cardiac patient.   Exercise & Equipment Safety: - Individual verbal instruction and demonstration of equipment use and safety with use of the equipment.   Infection Prevention: - Provides verbal and written material to individual with discussion of infection control including proper hand washing and proper equipment cleaning during exercise session.   Falls Prevention: - Provides verbal and written material to individual with discussion of falls prevention and safety.   Diabetes: - Individual verbal and written instruction to review signs/symptoms of diabetes, desired ranges of glucose level fasting, after meals and with exercise. Advice that pre and post exercise glucose checks will be done for 3 sessions at entry of program.    Knowledge Questionnaire Score:   Personal Goals and Risk Factors at Admission:     Personal Goals and Risk Factors at Admission - 07/20/14 1406    Personal Goals and Risk Factors on Admission    Weight Management Obesity;Yes   Intervention Learn and follow the exercise and diet guidelines while in the program. Utilize the nutrition and education classes to help gain knowledge of the diet and exercise expectations in the program   Intervention Provide weight management tools through evaluation completed by registered dietician and exercise physiologist.  Establish a goal weight with participant.    Increase Aerobic Exercise and Physical Activity Yes   Intervention While in program, learn and follow the exercise prescription taught. Start at a low level workload and increase workload after able to maintain previous level for 30 minutes. Increase time before increasing intensity.   Understand more about  Heart/Pulmonary Disease. Yes   Intervention While in program utilize professionals for any questions, and attend the education sessions. Great websites to use are www.americanheart.org or www.lung.org for reliable information.   Diabetes No   Hypertension Yes   Goal Participant will see blood pressure controlled within the values of 140/63mm/Hg or within value directed by their physician.   Intervention Provide nutrition & aerobic exercise along with prescribed medications to achieve BP 140/90 or less.   Lipids Yes   Goal Cholesterol controlled with medications as prescribed, with individualized exercise RX and with personalized nutrition plan. Value goals: LDL < $Rem'70mg'TNFI$ , HDL > $Rem'40mg'TgVp$ . Participant states understanding of desired cholesterol values and following prescriptions.   Intervention Provide nutrition & aerobic exercise along with prescribed medications to achieve LDL '70mg'$ , HDL >$Remo'40mg'wwZYP$ .   Stress Yes   Goal To meet with psychosocial counselor for stress and relaxation information and guidance. To state understanding of performing relaxation techniques and or identifying personal stressors.   Intervention Provide education on types of stress, identifiying stressors, and ways to cope with stress. Provide demonstration and active practice of relaxation techniques.      Personal Goals and Risk Factors Review:      Goals and Risk Factor Review      08/13/14 1107           Increase Aerobic Exercise and Physical Activity   Goals Progress/Improvement seen  Yes       Hypertension   Goal Participant will see blood pressure controlled within the values of 140/57mm/Hg or within value directed by  their physician.       Stress   Goal --  Met with Cardiac Rehab counselor.           Personal Goals Discharge:     Comments: 30 day review. Continue with ITP.

## 2014-08-19 ENCOUNTER — Other Ambulatory Visit: Payer: Self-pay | Admitting: *Deleted

## 2014-08-19 DIAGNOSIS — I2111 ST elevation (STEMI) myocardial infarction involving right coronary artery: Secondary | ICD-10-CM

## 2014-08-19 DIAGNOSIS — Z9861 Coronary angioplasty status: Secondary | ICD-10-CM

## 2014-09-01 ENCOUNTER — Encounter: Payer: BLUE CROSS/BLUE SHIELD | Attending: Interventional Cardiology

## 2014-09-01 DIAGNOSIS — I2111 ST elevation (STEMI) myocardial infarction involving right coronary artery: Secondary | ICD-10-CM | POA: Insufficient documentation

## 2014-09-01 DIAGNOSIS — Z9861 Coronary angioplasty status: Secondary | ICD-10-CM | POA: Insufficient documentation

## 2014-09-07 ENCOUNTER — Encounter: Payer: Self-pay | Admitting: *Deleted

## 2014-09-08 ENCOUNTER — Telehealth: Payer: Self-pay | Admitting: *Deleted

## 2014-09-08 NOTE — Telephone Encounter (Signed)
Left Voicemail message for Sheri [name] requesting update on her situation and condition, as patient has not been attending cardiac rehab sessions.  Just following up with patient.  Requested patient to return my call.

## 2014-09-11 ENCOUNTER — Ambulatory Visit
Admission: EM | Admit: 2014-09-11 | Discharge: 2014-09-11 | Disposition: A | Discharge status: Home / Self Care | Payer: BLUE CROSS/BLUE SHIELD | Attending: Family Medicine | Admitting: Family Medicine

## 2014-09-11 ENCOUNTER — Encounter: Payer: Self-pay | Admitting: *Deleted

## 2014-09-11 ENCOUNTER — Encounter: Payer: Self-pay | Admitting: Emergency Medicine

## 2014-09-11 DIAGNOSIS — Z9861 Coronary angioplasty status: Secondary | ICD-10-CM

## 2014-09-11 DIAGNOSIS — I10 Essential (primary) hypertension: Secondary | ICD-10-CM | POA: Insufficient documentation

## 2014-09-11 DIAGNOSIS — R109 Unspecified abdominal pain: Secondary | ICD-10-CM | POA: Diagnosis present

## 2014-09-11 DIAGNOSIS — Z87891 Personal history of nicotine dependence: Secondary | ICD-10-CM | POA: Diagnosis not present

## 2014-09-11 DIAGNOSIS — R300 Dysuria: Secondary | ICD-10-CM | POA: Diagnosis not present

## 2014-09-11 DIAGNOSIS — Z7982 Long term (current) use of aspirin: Secondary | ICD-10-CM | POA: Insufficient documentation

## 2014-09-11 DIAGNOSIS — R102 Pelvic and perineal pain: Secondary | ICD-10-CM | POA: Insufficient documentation

## 2014-09-11 DIAGNOSIS — I2111 ST elevation (STEMI) myocardial infarction involving right coronary artery: Secondary | ICD-10-CM

## 2014-09-11 HISTORY — DX: Acute myocardial infarction, unspecified: I21.9

## 2014-09-11 HISTORY — DX: History of uterine scar from previous surgery: Z98.891

## 2014-09-11 HISTORY — DX: Presence of coronary angioplasty implant and graft: Z95.5

## 2014-09-11 LAB — URINALYSIS COMPLETE WITH MICROSCOPIC (ARMC ONLY)
PH: 5 (ref 5.0–8.0)
Specific Gravity, Urine: 1.02 (ref 1.005–1.030)

## 2014-09-11 MED ORDER — CIPROFLOXACIN HCL 500 MG PO TABS: 500.0000 mg | ORAL_TABLET | Freq: Two times a day (BID) | ORAL | Status: DC

## 2014-09-11 MED ORDER — HYDROCODONE-ACETAMINOPHEN 5-325 MG PO TABS: 1.0000 | ORAL_TABLET | Freq: Four times a day (QID) | ORAL | Status: DC | PRN

## 2014-09-11 MED ORDER — ONDANSETRON 8 MG PO TBDP: 8.0000 mg | ORAL_TABLET | Freq: Two times a day (BID) | ORAL | Status: DC

## 2014-09-11 NOTE — ED Notes (Signed)
Constant pressure and pain lower quadrant ob abdomen

## 2014-09-11 NOTE — ED Provider Notes (Signed)
CSN: [id number]     Arrival date & time 09/11/14  1855 History   First MD Initiated Contact with Patient 09/11/14 1919     Chief Complaint  Patient presents with  . Abdominal Pain   (Consider location/radiation/quality/duration/timing/severity/associated sxs/prior Treatment) HPI Comments: 49 yo female with a 2-3 days h/o discomfort with urination, pressure sensation in the left lower pelvic area, blood in urine, nausea, "clammy". Denies vomiting, fevers, chest pains, shortness of breath. Took some tylenol and pyridium today.   The history is provided by the patient.    Past Medical History  Diagnosis Date  . Hypertension   . Heart attack 03/2014  . History of C-section   . Hx of heart artery stent    Past Surgical History  Procedure Laterality Date  . Cesarean section    . Left heart catheterization with coronary angiogram N/A 04/06/2014    Procedure: LEFT HEART CATHETERIZATION WITH CORONARY ANGIOGRAM;  Surgeon: Jettie Booze, MD;  Location: [facility] CATH LAB;  Service: Cardiovascular;  Laterality: N/A;  . Cardiac surgery     Family History  Problem Relation Age of Onset  . Heart attack Father 54    died of MI at age 54  . Hypertension Mother   . Hyperlipidemia Mother    Social History  Substance Use Topics  . Smoking status: Former Research scientist (life sciences)  . Smokeless tobacco: None  . Alcohol Use: No   OB History    No data available     Review of Systems  Allergies  Review of patient's allergies indicates no known allergies.  Home Medications   Prior to Admission medications   Medication Sig Start Date End Date Taking? Authorizing Provider  aspirin 81 MG chewable tablet Chew 1 tablet (81 mg total) by mouth daily. 04/09/14   Brittainy Erie Noe, PA-C  ciprofloxacin (CIPRO) 500 MG tablet Take 1 tablet (500 mg total) by mouth 2 (two) times daily. 09/11/14   Norval Gable, MD  clopidogrel (PLAVIX) 75 MG tablet TAKE 1 TABLET (75 MG TOTAL) BY MOUTH DAILY. 07/08/14   Jettie Booze,  MD  HYDROcodone-acetaminophen (NORCO/VICODIN) 5-325 MG per tablet Take 1-2 tablets by mouth every 6 (six) hours as needed. 09/11/14   Norval Gable, MD  isosorbide mononitrate (IMDUR) 30 MG 24 hr tablet Take 1 tablet (30 mg total) by mouth daily. 07/06/14   Jettie Booze, MD  lisinopril (PRINIVIL,ZESTRIL) 5 MG tablet Take 1 tablet (5 mg total) by mouth daily. 05/04/14   Jettie Booze, MD  metoprolol tartrate (LOPRESSOR) 25 MG tablet Take 1 tablet (25 mg total) by mouth 2 (two) times daily. 05/05/14   Jettie Booze, MD  nitroGLYCERIN (NITROSTAT) 0.4 MG SL tablet Place 1 tablet (0.4 mg total) under the tongue every 5 (five) minutes x 3 doses as needed for chest pain. 04/09/14   Brittainy M Simmons, PA-C  ondansetron (ZOFRAN ODT) 8 MG disintegrating tablet Take 1 tablet (8 mg total) by mouth 2 (two) times daily. 09/11/14   Norval Gable, MD  Probiotic Product (ALIGN) 4 MG CAPS Take 4 mg by mouth 2 (two) times daily.    Historical Provider, MD  rosuvastatin (CRESTOR) 10 MG tablet Take 1 tablet (10 mg total) by mouth daily. 06/24/14   Burtis Junes, NP   BP 121/73 mmHg  Pulse 83  Temp(Src) 97.8 F (36.6 C) (Oral)  Resp 16  Ht 5\' 5"  (1.651 m)  Wt 240 lb (108.863 kg)  BMI 39.94 kg/m2  SpO2 97% Physical Exam  Constitutional: She appears well-developed and well-nourished. No distress.  Cardiovascular: Normal rate.   Pulmonary/Chest: Effort normal. No respiratory distress.  Abdominal: Soft. Bowel sounds are normal. She exhibits no distension and no mass. There is tenderness (mild tenderness to left lower pelvic region and suprapubic area; no rebound or guarding). There is no rebound and no guarding.  Neurological: She is alert.  Skin: No rash noted. She is not diaphoretic.  Nursing note and vitals reviewed.   ED Course  Procedures (including critical care time) Labs Review Labs Reviewed  URINALYSIS COMPLETEWITH MICROSCOPIC ([facility] ONLY) - Abnormal; Notable for the following:     Color, Urine ORANGE (*)    APPearance HAZY (*)    Glucose, UA   (*)    Value: TEST NOT REPORTED DUE TO COLOR INTERFERENCE OF URINE PIGMENT   Bilirubin Urine   (*)    Value: TEST NOT REPORTED DUE TO COLOR INTERFERENCE OF URINE PIGMENT   Ketones, ur   (*)    Value: TEST NOT REPORTED DUE TO COLOR INTERFERENCE OF URINE PIGMENT   Hgb urine dipstick   (*)    Value: TEST NOT REPORTED DUE TO COLOR INTERFERENCE OF URINE PIGMENT   Protein, ur   (*)    Value: TEST NOT REPORTED DUE TO COLOR INTERFERENCE OF URINE PIGMENT   Nitrite   (*)    Value: TEST NOT REPORTED DUE TO COLOR INTERFERENCE OF URINE PIGMENT   Leukocytes, UA   (*)    Value: TEST NOT REPORTED DUE TO COLOR INTERFERENCE OF URINE PIGMENT   Bacteria, UA FEW (*)    Squamous Epithelial / LPF 6-30 (*)    All other components within normal limits  URINE CULTURE    Imaging Review No results found.   MDM   1. Pelvic pain in female   2. Dysuria    Discharge Medication List as of 09/11/2014  8:00 PM    START taking these medications   Details  ciprofloxacin (CIPRO) 500 MG tablet Take 1 tablet (500 mg total) by mouth 2 (two) times daily., Starting 09/11/2014, Until Discontinued, Print    HYDROcodone-acetaminophen (NORCO/VICODIN) 5-325 MG per tablet Take 1-2 tablets by mouth every 6 (six) hours as needed., Starting 09/11/2014, Until Discontinued, Print    ondansetron (ZOFRAN ODT) 8 MG disintegrating tablet Take 1 tablet (8 mg total) by mouth 2 (two) times daily., Starting 09/11/2014, Until Discontinued, Print      Plan: 1. Test results and possible diagnoses reviewed with patient 2. rx as per orders; risks, benefits, potential side effects reviewed with patient 3. Check urine culture 4. Recommend supportive treatment with increased fluids 5. F/u prn if symptoms worsen or don't improve    Norval Gable, MD 09/11/14 2021

## 2014-09-11 NOTE — Progress Notes (Signed)
Cardiac Individual Treatment Plan  Patient Details  Name: NATINA WIGINTON MRN: [id number] Date of Birth: March 31, 1965 Referring Provider:  Jettie Booze, MD  Initial Encounter Date:    Visit Diagnosis: S/P PTCA (percutaneous transluminal coronary angioplasty)  ST elevation myocardial infarction involving right coronary artery  Patient's Home Medications on Admission:  Current outpatient prescriptions:  .  aspirin 81 MG chewable tablet, Chew 1 tablet (81 mg total) by mouth daily., Disp: , Rfl:  .  clopidogrel (PLAVIX) 75 MG tablet, TAKE 1 TABLET (75 MG TOTAL) BY MOUTH DAILY., Disp: 30 tablet, Rfl: 5 .  isosorbide mononitrate (IMDUR) 30 MG 24 hr tablet, Take 1 tablet (30 mg total) by mouth daily., Disp: 90 tablet, Rfl: 3 .  lisinopril (PRINIVIL,ZESTRIL) 5 MG tablet, Take 1 tablet (5 mg total) by mouth daily., Disp: 90 tablet, Rfl: 3 .  metoprolol tartrate (LOPRESSOR) 25 MG tablet, Take 1 tablet (25 mg total) by mouth 2 (two) times daily., Disp: 180 tablet, Rfl: 3 .  nitroGLYCERIN (NITROSTAT) 0.4 MG SL tablet, Place 1 tablet (0.4 mg total) under the tongue every 5 (five) minutes x 3 doses as needed for chest pain., Disp: 25 tablet, Rfl: 2 .  Probiotic Product (ALIGN) 4 MG CAPS, Take 4 mg by mouth 2 (two) times daily., Disp: , Rfl:  .  rosuvastatin (CRESTOR) 10 MG tablet, Take 1 tablet (10 mg total) by mouth daily., Disp: 30 tablet, Rfl: 6  Past Medical History: Past Medical History  Diagnosis Date  . Hypertension     Tobacco Use: History  Smoking status  . Former Smoker  Smokeless tobacco  . Not on file    Labs: Recent Review Flowsheet Data    Labs for ITP Cardiac and Pulmonary Rehab Latest Ref Rng 04/06/2014 04/07/2014 07/27/2014   Cholestrol 0 - 200 mg/dL - 179 130   LDLCALC 0 - 99 mg/dL - 121(H) 76   HDL >39.00 mg/dL - 33(L) 29.10(L)   Trlycerides 0.0 - 149.0 mg/dL - 125 124.0   Hemoglobin A1c 4.8 - 5.6 % 5.6 - -   TCO2 0 - 100 mmol/L 21 - -       Exercise Target  Goals:    Exercise Program Goal: Individual exercise prescription set with THRR, safety & activity barriers. Participant demonstrates ability to understand and report RPE using BORG scale, to self-measure pulse accurately, and to acknowledge the importance of the exercise prescription.  Exercise Prescription Goal: Starting with aerobic activity 30 plus minutes a day, 3 days per week for initial exercise prescription. Provide home exercise prescription and guidelines that participant acknowledges understanding prior to discharge.  Activity Barriers & Risk Stratification:     Activity Barriers & Risk Stratification - 07/20/14 1405    Activity Barriers & Risk Stratification   Activity Barriers None;Deconditioning   Risk Stratification High      6 Minute Walk:     6 Minute Walk      07/20/14 1514       6 Minute Walk   Phase Initial     Distance 1525 feet     Walk Time 6 minutes     Resting HR 67 bpm     Resting BP 138/80 mmHg     Max Ex. HR 106 bpm     Max Ex. BP 160/90 mmHg     RPE 9     Symptoms No        Initial Exercise Prescription:     Initial Exercise Prescription - 07/20/14  1500    Date of Initial Exercise Prescription   Date 07/20/14   Treadmill   MPH 2.8   Grade 0   Minutes 15   Bike   Level 1.5   Minutes 15   Recumbant Bike   Level 3   Watts 30   Minutes 15   NuStep   Level 4   Watts 40   Minutes 15   Arm Ergometer   Level 2   Watts 10   Minutes 15   Arm/Foot Ergometer   Level 2   Watts 15   Minutes 15   Cybex   Level 2   RPM 40   Minutes 15   Recumbant Elliptical   Level 2   Watts 30   Minutes 15   Elliptical   Level 1   Speed 3   Minutes 10   REL-XR   Level 3   Watts 30   Minutes 15   Prescription Details   Frequency (times per week) 3   Duration Progress to 30 minutes of continuous aerobic without signs/symptoms of physical distress   Intensity   THRR REST +  30   Ratings of Perceived Exertion 11-13   Progression  Continue progressive overload as per policy without signs/symptoms or physical distress.   Resistance Training   Training Prescription Yes   Weight 2   Reps 10-12      Exercise Prescription Changes:     Exercise Prescription Changes      08/11/14 0600 09/07/14 1100         Exercise Review   Progression Yes No  Only one visit since last review (08/13/14)      Response to Exercise   Blood Pressure (Admit) 122/80 mmHg 138/80 mmHg      Blood Pressure (Exercise) 152/70 mmHg 142/82 mmHg      Blood Pressure (Exit) 132/78 mmHg 106/70 mmHg      Heart Rate (Admit) 75 bpm 80 bpm      Heart Rate (Exercise) 127 bpm 114 bpm      Heart Rate (Exit) 74 bpm 68 bpm      Symptoms No No      Duration Progress to 50 minutes of aerobic without signs/symptoms of physical distress Progress to 50 minutes of aerobic without signs/symptoms of physical distress      Intensity Other (comment)  40-85% HRR 114-159 Other (comment)  40-85% HRR 114-159      Progression Continue progressive overload as per policy without signs/symptoms or physical distress. Continue progressive overload as per policy without signs/symptoms or physical distress.      Resistance Training   Training Prescription Yes Yes      Weight 5 5      Reps 10-15 10-15      Interval Training   Interval Training Yes Yes      Equipment Treadmill Treadmill      Treadmill   MPH 3.2 3.2      Grade 2 2      Minutes 25 25      Elliptical   Level 2 2      Speed 3.5 3.5      Minutes 20 20         Discharge Exercise Prescription (Final Exercise Prescription Changes):     Exercise Prescription Changes - 09/07/14 1100    Exercise Review   Progression No  Only one visit since last review (08/13/14)   Response to Exercise   Blood Pressure (Admit)  138/80 mmHg   Blood Pressure (Exercise) 142/82 mmHg   Blood Pressure (Exit) 106/70 mmHg   Heart Rate (Admit) 80 bpm   Heart Rate (Exercise) 114 bpm   Heart Rate (Exit) 68 bpm   Symptoms No    Duration Progress to 50 minutes of aerobic without signs/symptoms of physical distress   Intensity Other (comment)  40-85% HRR 114-159   Progression Continue progressive overload as per policy without signs/symptoms or physical distress.   Resistance Training   Training Prescription Yes   Weight 5   Reps 10-15   Interval Training   Interval Training Yes   Equipment Treadmill   Treadmill   MPH 3.2   Grade 2   Minutes 25   Elliptical   Level 2   Speed 3.5   Minutes 20      Nutrition:  Target Goals: Understanding of nutrition guidelines, daily intake of sodium <1542m, cholesterol <2010m calories 30% from fat and 7% or less from saturated fats, daily to have 5 or more servings of fruits and vegetables.  Biometrics:     Pre Biometrics - 07/20/14 1522    Pre Biometrics   Height 5' 6.25" (1.683 m)   Weight 237 lb 12.8 oz (107.865 kg)   Waist Circumference 41 inches   Hip Circumference 51.5 inches   Waist to Hip Ratio 0.8 %   BMI (Calculated) 38.2       Nutrition Therapy Plan and Nutrition Goals:     Nutrition Therapy & Goals - 08/11/14 1817    Nutrition Therapy   Diet 1200kcal DASH   Drug/Food Interactions Statins/Certain Fruits   Fiber 25 grams   Whole Grain Foods 3 servings   Protein 6 ounces/day   Saturated Fats 10 max. grams   Fruits and Vegetables 8 servings/day   Personal Nutrition Goals   Personal Goal #1 follow 1200kcal goal for weight loss   Personal Goal #2 continue with healthy food choices      Nutrition Discharge: Rate Your Plate Scores:     Rate Your Plate - 0762/03/5589741  Rate Your Plate Scores   Pre Score 59   Pre Score % 66 %      Nutrition Goals Re-Evaluation:     Nutrition Goals Re-Evaluation      08/13/14 1108           Personal Goal #1 Re-Evaluation   Personal Goal #1 Is following the 1200 kcal diet.       Personal Goal #2 Re-Evaluation   Goal Progress Seen Yes          Psychosocial: Target Goals: Acknowledge  presence or absence of depression, maximize coping skills, provide positive support system. Participant is able to verbalize types and ability to use techniques and skills needed for reducing stress and depression.  Initial Review & Psychosocial Screening:   Quality of Life Scores:   PHQ-9:     Recent Review Flowsheet Data    [facility]   Decreased Interest 0   Down, Depressed, Hopeless 1   PHQ - 2 Score 1   Altered sleeping 1   Tired, decreased energy 3   Change in appetite 2   Feeling bad or failure about yourself  2   Trouble concentrating 3   Moving slowly or fidgety/restless 0   Suicidal thoughts 0   PHQ-9 Score 12   Difficult doing work/chores Somewhat difficult      Psychosocial Evaluation and Intervention:  Psychosocial Evaluation - 08/11/14 0937    Psychosocial Evaluation & Interventions   Interventions Therapist referral;Stress management education;Relaxation education;Encouraged to exercise with the program and follow exercise prescription   Comments Counselor met with Ms. Beauchamp today for initial psychosocial evaluation.  She is a 49 year old who recently had a heart attacks and has a family history of heart disease.  Ms. Severs has a strong support system with a spouse of 19 years.  She states she is sleeping okay and eating "too much."  She  reports a history of situational depression following the death of her father 18 years ago and some current symptoms since her heart attack.  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  Due to her current  stressors of health issues, daughter going off to college and her current symptoms, Counselor recommended a therapist and gave Ms. Fetch the contact information.  Counselor also assessed stress management techniques and practiced breathing techniques for relaxation as well as some visual imagery.  Ms. Weir will benefit from meeting with the dietician to work on her  weight loss goals.  She will also benefit from the psychoeducational components of this program as well as consistent exercise.     Continued Psychosocial Services Needed Yes  Ms. Norby will benefit from meeting with the dietician, participating in the psychoeducational components of this program - especially stress management, depression and relaxation techniques.  She was given contact info for a local therapist.        Psychosocial Re-Evaluation:   Vocational Rehabilitation: Provide vocational rehab assistance to qualifying candidates.   Vocational Rehab Evaluation & Intervention:     Vocational Rehab - 07/20/14 1405    Initial Vocational Rehab Evaluation & Intervention   Assessment shows need for Vocational Rehabilitation No      Education: Education Goals: Education classes will be provided on a weekly basis, covering required topics. Participant will state understanding/return demonstration of topics presented.  Learning Barriers/Preferences:     Learning Barriers/Preferences - 07/20/14 1405    Learning Barriers/Preferences   Learning Barriers None   Learning Preferences None      Education Topics: General Nutrition Guidelines/Fats and Fiber: -Group instruction provided by verbal, written material, models and posters to present the general guidelines for heart healthy nutrition. Gives an explanation and review of dietary fats and fiber.          Cardiac Rehab from 08/13/2014 in [facility] Cardiac Rehab   Date  07/21/14   Educator  CR   Instruction Review Code  2- meets goals/outcomes      Controlling Sodium/Reading Food Labels: -Group verbal and written material supporting the discussion of sodium use in heart healthy nutrition. Review and explanation with models, verbal and written materials for utilization of the food label.      Cardiac Rehab from 08/13/2014 in [facility] Cardiac Rehab   Date  07/28/14   Educator  CR   Instruction Review Code  2- meets goals/outcomes       Exercise Physiology & Risk Factors: - Group verbal and written instruction with models to review the exercise physiology of the cardiovascular system and associated critical values. Details cardiovascular disease risk factors and the goals associated with each risk factor.      Cardiac Rehab from 08/13/2014 in [facility] Cardiac Rehab   Date  08/13/14   Educator  Hessie Knows      Aerobic Exercise & Resistance Training: - Gives group verbal and  written discussion on the health impact of inactivity. On the components of aerobic and resistive training programs and the benefits of this training and how to safely progress through these programs.   Flexibility, Balance, General Exercise Guidelines: - Provides group verbal and written instruction on the benefits of flexibility and balance training programs. Provides general exercise guidelines with specific guidelines to those with heart or lung disease. Demonstration and skill practice provided.   Stress Management: - Provides group verbal and written instruction about the health risks of elevated stress, cause of high stress, and healthy ways to reduce stress.   Depression: - Provides group verbal and written instruction on the correlation between heart/lung disease and depressed mood, treatment options, and the stigmas associated with seeking treatment.   Anatomy & Physiology of the Heart: - Group verbal and written instruction and models provide basic cardiac anatomy and physiology, with the coronary electrical and arterial systems. Review of: AMI, Angina, Valve disease, Heart Failure, Cardiac Arrhythmia, Pacemakers, and the ICD.   Cardiac Procedures: - Group verbal and written instruction and models to describe the testing methods done to diagnose heart disease. Reviews the outcomes of the test results. Describes the treatment choices: Medical Management, Angioplasty, or Coronary Bypass Surgery.   Cardiac Medications: - Group  verbal and written instruction to review commonly prescribed medications for heart disease. Reviews the medication, class of the drug, and side effects. Includes the steps to properly store meds and maintain the prescription regimen.      Cardiac Rehab from 08/13/2014 in [facility] Cardiac Rehab   Date  08/11/14   Educator  DW   Instruction Review Code  2- meets goals/outcomes      Go Sex-Intimacy & Heart Disease, Get SMART - Goal Setting: - Group verbal and written instruction through game format to discuss heart disease and the return to sexual intimacy. Provides group verbal and written material to discuss and apply goal setting through the application of the S.M.A.R.T. Method.   Other Matters of the Heart: - Provides group verbal, written materials and models to describe Heart Failure, Angina, Valve Disease, and Diabetes in the realm of heart disease. Includes description of the disease process and treatment options available to the cardiac patient.   Exercise & Equipment Safety: - Individual verbal instruction and demonstration of equipment use and safety with use of the equipment.   Infection Prevention: - Provides verbal and written material to individual with discussion of infection control including proper hand washing and proper equipment cleaning during exercise session.   Falls Prevention: - Provides verbal and written material to individual with discussion of falls prevention and safety.   Diabetes: - Individual verbal and written instruction to review signs/symptoms of diabetes, desired ranges of glucose level fasting, after meals and with exercise. Advice that pre and post exercise glucose checks will be done for 3 sessions at entry of program.    Knowledge Questionnaire Score:   Personal Goals and Risk Factors at Admission:     Personal Goals and Risk Factors at Admission - 07/20/14 1406    Personal Goals and Risk Factors on Admission    Weight Management Obesity;Yes    Intervention Learn and follow the exercise and diet guidelines while in the program. Utilize the nutrition and education classes to help gain knowledge of the diet and exercise expectations in the program   Intervention Provide weight management tools through evaluation completed by registered dietician and exercise physiologist.  Establish a goal weight with participant.   Increase  Aerobic Exercise and Physical Activity Yes   Intervention While in program, learn and follow the exercise prescription taught. Start at a low level workload and increase workload after able to maintain previous level for 30 minutes. Increase time before increasing intensity.   Understand more about Heart/Pulmonary Disease. Yes   Intervention While in program utilize professionals for any questions, and attend the education sessions. Great websites to use are www.americanheart.org or www.lung.org for reliable information.   Diabetes No   Hypertension Yes   Goal Participant will see blood pressure controlled within the values of 140/25m/Hg or within value directed by their physician.   Intervention Provide nutrition & aerobic exercise along with prescribed medications to achieve BP 140/90 or less.   Lipids Yes   Goal Cholesterol controlled with medications as prescribed, with individualized exercise RX and with personalized nutrition plan. Value goals: LDL < 788m HDL > 4076mParticipant states understanding of desired cholesterol values and following prescriptions.   Intervention Provide nutrition & aerobic exercise along with prescribed medications to achieve LDL <10m73mDL >40mg59mStress Yes   Goal To meet with psychosocial counselor for stress and relaxation information and guidance. To state understanding of performing relaxation techniques and or identifying personal stressors.   Intervention Provide education on types of stress, identifiying stressors, and ways to cope with stress. Provide demonstration and  active practice of relaxation techniques.      Personal Goals and Risk Factors Review:      Goals and Risk Factor Review      08/13/14 1107           Increase Aerobic Exercise and Physical Activity   Goals Progress/Improvement seen  Yes       Hypertension   Goal Participant will see blood pressure controlled within the values of 140/90mm/61mr within value directed by their physician.       Stress   Goal --  Met with Cardiac Rehab counselor.           Personal Goals Discharge:     Comments: 30 day review Lynn [facility] absent from program. Expect return soon.

## 2014-09-15 LAB — URINE CULTURE
Culture: 6000
Special Requests: NORMAL

## 2014-09-16 ENCOUNTER — Other Ambulatory Visit: Payer: Self-pay | Admitting: *Deleted

## 2014-09-16 DIAGNOSIS — Z9861 Coronary angioplasty status: Secondary | ICD-10-CM

## 2014-09-16 NOTE — Progress Notes (Signed)
Cardiac Individual Treatment Plan  Patient Details  Name: Sheri [name] MRN: [id number] Date of Birth: [birth year] Referring Provider:  No ref. provider found  Initial Encounter Date:    Visit Diagnosis: No diagnosis found.  Patient's Home Medications on Admission:  Current outpatient prescriptions:  .  aspirin 81 MG chewable tablet, Chew 1 tablet (81 mg total) by mouth daily., Disp: , Rfl:  .  ciprofloxacin (CIPRO) 500 MG tablet, Take 1 tablet (500 mg total) by mouth 2 (two) times daily., Disp: 6 tablet, Rfl: 0 .  clopidogrel (PLAVIX) 75 MG tablet, TAKE 1 TABLET (75 MG TOTAL) BY MOUTH DAILY., Disp: 30 tablet, Rfl: 5 .  HYDROcodone-acetaminophen (NORCO/VICODIN) 5-325 MG per tablet, Take 1-2 tablets by mouth every 6 (six) hours as needed., Disp: 12 tablet, Rfl: 0 .  isosorbide mononitrate (IMDUR) 30 MG 24 hr tablet, Take 1 tablet (30 mg total) by mouth daily., Disp: 90 tablet, Rfl: 3 .  lisinopril (PRINIVIL,ZESTRIL) 5 MG tablet, Take 1 tablet (5 mg total) by mouth daily., Disp: 90 tablet, Rfl: 3 .  metoprolol tartrate (LOPRESSOR) 25 MG tablet, Take 1 tablet (25 mg total) by mouth 2 (two) times daily., Disp: 180 tablet, Rfl: 3 .  nitroGLYCERIN (NITROSTAT) 0.4 MG SL tablet, Place 1 tablet (0.4 mg total) under the tongue every 5 (five) minutes x 3 doses as needed for chest pain., Disp: 25 tablet, Rfl: 2 .  ondansetron (ZOFRAN ODT) 8 MG disintegrating tablet, Take 1 tablet (8 mg total) by mouth 2 (two) times daily., Disp: 6 tablet, Rfl: 0 .  Probiotic Product (ALIGN) 4 MG CAPS, Take 4 mg by mouth 2 (two) times daily., Disp: , Rfl:  .  rosuvastatin (CRESTOR) 10 MG tablet, Take 1 tablet (10 mg total) by mouth daily., Disp: 30 tablet, Rfl: 6  Past Medical History: Past Medical History  Diagnosis Date  . Hypertension   . Heart attack 03/2014  . History of C-section   . Hx of heart artery stent     Tobacco Use: History  Smoking status  . Former Smoker  Smokeless tobacco  . Not on file     Labs: Recent Review Flowsheet Data    Labs for ITP Cardiac and Pulmonary Rehab Latest Ref Rng 04/06/2014 04/07/2014 07/27/2014   Cholestrol 0 - 200 mg/dL - 179 130   LDLCALC 0 - 99 mg/dL - 121(H) 76   HDL >39.00 mg/dL - 33(L) 29.10(L)   Trlycerides 0.0 - 149.0 mg/dL - 125 124.0   Hemoglobin A1c 4.8 - 5.6 % 5.6 - -   TCO2 0 - 100 mmol/L 21 - -       Exercise Target Goals:    Exercise Program Goal: Individual exercise prescription set with THRR, safety & activity barriers. Participant demonstrates ability to understand and report RPE using BORG scale, to self-measure pulse accurately, and to acknowledge the importance of the exercise prescription.  Exercise Prescription Goal: Starting with aerobic activity 30 plus minutes a day, 3 days per week for initial exercise prescription. Provide home exercise prescription and guidelines that participant acknowledges understanding prior to discharge.  Activity Barriers & Risk Stratification:     Activity Barriers & Risk Stratification - 07/20/14 1405    Activity Barriers & Risk Stratification   Activity Barriers None;Deconditioning   Risk Stratification High      6 Minute Walk:     6 Minute Walk      07/20/14 1514       6 Minute Walk   Phase  Initial     Distance 1525 feet     Walk Time 6 minutes     Resting HR 67 bpm     Resting BP 138/80 mmHg     Max Ex. HR 106 bpm     Max Ex. BP 160/90 mmHg     RPE 9     Symptoms No        Initial Exercise Prescription:     Initial Exercise Prescription - 07/20/14 1500    Date of Initial Exercise Prescription   Date 07/20/14   Treadmill   MPH 2.8   Grade 0   Minutes 15   Bike   Level 1.5   Minutes 15   Recumbant Bike   Level 3   Watts 30   Minutes 15   NuStep   Level 4   Watts 40   Minutes 15   Arm Ergometer   Level 2   Watts 10   Minutes 15   Arm/Foot Ergometer   Level 2   Watts 15   Minutes 15   Cybex   Level 2   RPM 40   Minutes 15   Recumbant Elliptical    Level 2   Watts 30   Minutes 15   Elliptical   Level 1   Speed 3   Minutes 10   REL-XR   Level 3   Watts 30   Minutes 15   Prescription Details   Frequency (times per week) 3   Duration Progress to 30 minutes of continuous aerobic without signs/symptoms of physical distress   Intensity   THRR REST +  30   Ratings of Perceived Exertion 11-13   Progression Continue progressive overload as per policy without signs/symptoms or physical distress.   Resistance Training   Training Prescription Yes   Weight 2   Reps 10-12      Exercise Prescription Changes:     Exercise Prescription Changes      08/11/14 0600 09/07/14 1100         Exercise Review   Progression Yes No  Only one visit since last review (08/13/14)      Response to Exercise   Blood Pressure (Admit) 122/80 mmHg 138/80 mmHg      Blood Pressure (Exercise) 152/70 mmHg 142/82 mmHg      Blood Pressure (Exit) 132/78 mmHg 106/70 mmHg      Heart Rate (Admit) 75 bpm 80 bpm      Heart Rate (Exercise) 127 bpm 114 bpm      Heart Rate (Exit) 74 bpm 68 bpm      Symptoms No No      Duration Progress to 50 minutes of aerobic without signs/symptoms of physical distress Progress to 50 minutes of aerobic without signs/symptoms of physical distress      Intensity Other (comment)  40-85% HRR 114-159 Other (comment)  40-85% HRR 114-159      Progression Continue progressive overload as per policy without signs/symptoms or physical distress. Continue progressive overload as per policy without signs/symptoms or physical distress.      Resistance Training   Training Prescription Yes Yes      Weight 5 5      Reps 10-15 10-15      Interval Training   Interval Training Yes Yes      Equipment Treadmill Treadmill      Treadmill   MPH 3.2 3.2      Grade 2 2      Minutes  25 25      Elliptical   Level 2 2      Speed 3.5 3.5      Minutes 20 20         Discharge Exercise Prescription (Final Exercise Prescription Changes):      Exercise Prescription Changes - 09/07/14 1100    Exercise Review   Progression No  Only one visit since last review (08/13/14)   Response to Exercise   Blood Pressure (Admit) 138/80 mmHg   Blood Pressure (Exercise) 142/82 mmHg   Blood Pressure (Exit) 106/70 mmHg   Heart Rate (Admit) 80 bpm   Heart Rate (Exercise) 114 bpm   Heart Rate (Exit) 68 bpm   Symptoms No   Duration Progress to 50 minutes of aerobic without signs/symptoms of physical distress   Intensity Other (comment)  40-85% HRR 114-159   Progression Continue progressive overload as per policy without signs/symptoms or physical distress.   Resistance Training   Training Prescription Yes   Weight 5   Reps 10-15   Interval Training   Interval Training Yes   Equipment Treadmill   Treadmill   MPH 3.2   Grade 2   Minutes 25   Elliptical   Level 2   Speed 3.5   Minutes 20      Nutrition:  Target Goals: Understanding of nutrition guidelines, daily intake of sodium '1500mg'$ , cholesterol '200mg'$ , calories 30% from fat and 7% or less from saturated fats, daily to have 5 or more servings of fruits and vegetables.  Biometrics:     Pre Biometrics - 07/20/14 1522    Pre Biometrics   Height 5' 6.25" (1.683 m)   Weight 237 lb 12.8 oz (107.865 kg)   Waist Circumference 41 inches   Hip Circumference 51.5 inches   Waist to Hip Ratio 0.8 %   BMI (Calculated) 38.2       Nutrition Therapy Plan and Nutrition Goals:     Nutrition Therapy & Goals - 08/11/14 1817    Nutrition Therapy   Diet 1200kcal DASH   Drug/Food Interactions Statins/Certain Fruits   Fiber 25 grams   Whole Grain Foods 3 servings   Protein 6 ounces/day   Saturated Fats 10 max. grams   Fruits and Vegetables 8 servings/day   Personal Nutrition Goals   Personal Goal #1 follow 1200kcal goal for weight loss   Personal Goal #2 continue with healthy food choices      Nutrition Discharge: Rate Your Plate Scores:     Rate Your Plate - 81/10/31 5945     Rate Your Plate Scores   Pre Score 59   Pre Score % 66 %      Nutrition Goals Re-Evaluation:     Nutrition Goals Re-Evaluation      08/13/14 1108           Personal Goal #1 Re-Evaluation   Personal Goal #1 Is following the 1200 kcal diet.       Personal Goal #2 Re-Evaluation   Goal Progress Seen Yes          Psychosocial: Target Goals: Acknowledge presence or absence of depression, maximize coping skills, provide positive support system. Participant is able to verbalize types and ability to use techniques and skills needed for reducing stress and depression.  Initial Review & Psychosocial Screening:   Quality of Life Scores:   PHQ-9:     Recent Review Flowsheet Data    Depression screen [facility] 2/9 07/20/2014   Decreased Interest 0  Down, Depressed, Hopeless 1   PHQ - 2 Score 1   Altered sleeping 1   Tired, decreased energy 3   Change in appetite 2   Feeling bad or failure about yourself  2   Trouble concentrating 3   Moving slowly or fidgety/restless 0   Suicidal thoughts 0   PHQ-9 Score 12   Difficult doing work/chores Somewhat difficult      Psychosocial Evaluation and Intervention:     Psychosocial Evaluation - 08/11/14 0937    Psychosocial Evaluation & Interventions   Interventions Therapist referral;Stress management education;Relaxation education;Encouraged to exercise with the program and follow exercise prescription   Comments Counselor met with Ms. Cisse today for initial psychosocial evaluation.  She is a 49 year old who recently had a heart attacks and has a family history of heart disease.  Ms. Deitrick has a strong support system with a spouse of 19 years.  She states she is sleeping okay and eating "too much."  She  reports a history of situational depression following the death of her father 18 years ago and some current symptoms since her heart attack.  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  Due to her current   stressors of health issues, daughter going off to college and her current symptoms, Counselor recommended a therapist and gave Ms. Ibbotson the contact information.  Counselor also assessed stress management techniques and practiced breathing techniques for relaxation as well as some visual imagery.  Ms. Mikaelian will benefit from meeting with the dietician to work on her weight loss goals.  She will also benefit from the psychoeducational components of this program as well as consistent exercise.     Continued Psychosocial Services Needed Yes  Ms. Maguire will benefit from meeting with the dietician, participating in the psychoeducational components of this program - especially stress management, depression and relaxation techniques.  She was given contact info for a local therapist.        Psychosocial Re-Evaluation:   Vocational Rehabilitation: Provide vocational rehab assistance to qualifying candidates.   Vocational Rehab Evaluation & Intervention:     Vocational Rehab - 07/20/14 1405    Initial Vocational Rehab Evaluation & Intervention   Assessment shows need for Vocational Rehabilitation No      Education: Education Goals: Education classes will be provided on a weekly basis, covering required topics. Participant will state understanding/return demonstration of topics presented.  Learning Barriers/Preferences:     Learning Barriers/Preferences - 07/20/14 1405    Learning Barriers/Preferences   Learning Barriers None   Learning Preferences None      Education Topics: General Nutrition Guidelines/Fats and Fiber: -Group instruction provided by verbal, written material, models and posters to present the general guidelines for heart healthy nutrition. Gives an explanation and review of dietary fats and fiber.          Cardiac Rehab from 08/13/2014 in [facility] Cardiac Rehab   Date  07/21/14   Educator  CR   Instruction Review Code  2- meets goals/outcomes      Controlling  Sodium/Reading Food Labels: -Group verbal and written material supporting the discussion of sodium use in heart healthy nutrition. Review and explanation with models, verbal and written materials for utilization of the food label.      Cardiac Rehab from 08/13/2014 in [facility] Cardiac Rehab   Date  07/28/14   Educator  CR   Instruction Review Code  2- meets goals/outcomes  Exercise Physiology & Risk Factors: - Group verbal and written instruction with models to review the exercise physiology of the cardiovascular system and associated critical values. Details cardiovascular disease risk factors and the goals associated with each risk factor.      Cardiac Rehab from 08/13/2014 in [facility] Cardiac Rehab   Date  08/13/14   Educator  Hessie Knows      Aerobic Exercise & Resistance Training: - Gives group verbal and written discussion on the health impact of inactivity. On the components of aerobic and resistive training programs and the benefits of this training and how to safely progress through these programs.   Flexibility, Balance, General Exercise Guidelines: - Provides group verbal and written instruction on the benefits of flexibility and balance training programs. Provides general exercise guidelines with specific guidelines to those with heart or lung disease. Demonstration and skill practice provided.   Stress Management: - Provides group verbal and written instruction about the health risks of elevated stress, cause of high stress, and healthy ways to reduce stress.   Depression: - Provides group verbal and written instruction on the correlation between heart/lung disease and depressed mood, treatment options, and the stigmas associated with seeking treatment.   Anatomy & Physiology of the Heart: - Group verbal and written instruction and models provide basic cardiac anatomy and physiology, with the coronary electrical and arterial systems. Review of: AMI, Angina, Valve  disease, Heart Failure, Cardiac Arrhythmia, Pacemakers, and the ICD.   Cardiac Procedures: - Group verbal and written instruction and models to describe the testing methods done to diagnose heart disease. Reviews the outcomes of the test results. Describes the treatment choices: Medical Management, Angioplasty, or Coronary Bypass Surgery.   Cardiac Medications: - Group verbal and written instruction to review commonly prescribed medications for heart disease. Reviews the medication, class of the drug, and side effects. Includes the steps to properly store meds and maintain the prescription regimen.      Cardiac Rehab from 08/13/2014 in [facility] Cardiac Rehab   Date  08/11/14   Educator  DW   Instruction Review Code  2- meets goals/outcomes      Go Sex-Intimacy & Heart Disease, Get SMART - Goal Setting: - Group verbal and written instruction through game format to discuss heart disease and the return to sexual intimacy. Provides group verbal and written material to discuss and apply goal setting through the application of the S.M.A.R.T. Method.   Other Matters of the Heart: - Provides group verbal, written materials and models to describe Heart Failure, Angina, Valve Disease, and Diabetes in the realm of heart disease. Includes description of the disease process and treatment options available to the cardiac patient.   Exercise & Equipment Safety: - Individual verbal instruction and demonstration of equipment use and safety with use of the equipment.   Infection Prevention: - Provides verbal and written material to individual with discussion of infection control including proper hand washing and proper equipment cleaning during exercise session.   Falls Prevention: - Provides verbal and written material to individual with discussion of falls prevention and safety.   Diabetes: - Individual verbal and written instruction to review signs/symptoms of diabetes, desired ranges of glucose  level fasting, after meals and with exercise. Advice that pre and post exercise glucose checks will be done for 3 sessions at entry of program.    Knowledge Questionnaire Score:   Personal Goals and Risk Factors at Admission:     Personal Goals and Risk Factors at Admission -  07/20/14 1406    Personal Goals and Risk Factors on Admission    Weight Management Obesity;Yes   Intervention Learn and follow the exercise and diet guidelines while in the program. Utilize the nutrition and education classes to help gain knowledge of the diet and exercise expectations in the program   Intervention Provide weight management tools through evaluation completed by registered dietician and exercise physiologist.  Establish a goal weight with participant.   Increase Aerobic Exercise and Physical Activity Yes   Intervention While in program, learn and follow the exercise prescription taught. Start at a low level workload and increase workload after able to maintain previous level for 30 minutes. Increase time before increasing intensity.   Understand more about Heart/Pulmonary Disease. Yes   Intervention While in program utilize professionals for any questions, and attend the education sessions. Great websites to use are www.americanheart.org or www.lung.org for reliable information.   Diabetes No   Hypertension Yes   Goal Participant will see blood pressure controlled within the values of 140/55mm/Hg or within value directed by their physician.   Intervention Provide nutrition & aerobic exercise along with prescribed medications to achieve BP 140/90 or less.   Lipids Yes   Goal Cholesterol controlled with medications as prescribed, with individualized exercise RX and with personalized nutrition plan. Value goals: LDL < $Rem'70mg'fGFj$ , HDL > $Rem'40mg'QVQi$ . Participant states understanding of desired cholesterol values and following prescriptions.   Intervention Provide nutrition & aerobic exercise along with prescribed  medications to achieve LDL '70mg'$ , HDL >$Remo'40mg'rEIBX$ .   Stress Yes   Goal To meet with psychosocial counselor for stress and relaxation information and guidance. To state understanding of performing relaxation techniques and or identifying personal stressors.   Intervention Provide education on types of stress, identifiying stressors, and ways to cope with stress. Provide demonstration and active practice of relaxation techniques.      Personal Goals and Risk Factors Review:      Goals and Risk Factor Review      08/13/14 1107           Increase Aerobic Exercise and Physical Activity   Goals Progress/Improvement seen  Yes       Hypertension   Goal Participant will see blood pressure controlled within the values of 140/16mm/Hg or within value directed by their physician.       Stress   Goal --  Met with Cardiac Rehab counselor.           Personal Goals Discharge:     Comments: Not able to attend much.

## 2014-09-24 ENCOUNTER — Telehealth: Payer: Self-pay | Admitting: *Deleted

## 2014-09-24 NOTE — Telephone Encounter (Signed)
Left vm to see if she is returning to Cardiac Rehab.

## 2014-09-28 ENCOUNTER — Other Ambulatory Visit (INDEPENDENT_AMBULATORY_CARE_PROVIDER_SITE_OTHER): Payer: BLUE CROSS/BLUE SHIELD | Admitting: *Deleted

## 2014-09-28 DIAGNOSIS — E785 Hyperlipidemia, unspecified: Secondary | ICD-10-CM | POA: Diagnosis not present

## 2014-09-28 DIAGNOSIS — R748 Abnormal levels of other serum enzymes: Secondary | ICD-10-CM | POA: Diagnosis not present

## 2014-09-28 LAB — HEPATIC FUNCTION PANEL
ALT: 20 U/L (ref 0–35)
AST: 16 U/L (ref 0–37)
Albumin: 3.9 g/dL (ref 3.5–5.2)
Alkaline Phosphatase: 69 U/L (ref 39–117)
Bilirubin, Direct: 0.1 mg/dL (ref 0.0–0.3)
Total Bilirubin: 0.4 mg/dL (ref 0.2–1.2)
Total Protein: 6.8 g/dL (ref 6.0–8.3)

## 2014-09-30 ENCOUNTER — Other Ambulatory Visit (INDEPENDENT_AMBULATORY_CARE_PROVIDER_SITE_OTHER): Payer: BLUE CROSS/BLUE SHIELD

## 2014-09-30 DIAGNOSIS — R748 Abnormal levels of other serum enzymes: Secondary | ICD-10-CM | POA: Diagnosis not present

## 2014-09-30 LAB — CK TOTAL AND CKMB (NOT AT ARMC): Total CK: 69 U/L (ref 7–177)

## 2014-09-30 LAB — CARDIAC PANEL
CK-MB: 2.4 ng/mL (ref 0.3–4.0)
Relative Index: 3.3 calc — ABNORMAL HIGH (ref 0.0–2.5)
Total CK: 72 U/L (ref 7–177)

## 2014-10-01 ENCOUNTER — Ambulatory Visit (INDEPENDENT_AMBULATORY_CARE_PROVIDER_SITE_OTHER): Payer: BLUE CROSS/BLUE SHIELD | Admitting: Urology

## 2014-10-01 ENCOUNTER — Encounter: Payer: Self-pay | Admitting: Urology

## 2014-10-01 ENCOUNTER — Encounter: Payer: BLUE CROSS/BLUE SHIELD | Attending: Interventional Cardiology

## 2014-10-01 VITALS — BP 117/73 | HR 69 | Ht 65.0 in | Wt 240.9 lb

## 2014-10-01 DIAGNOSIS — R399 Unspecified symptoms and signs involving the genitourinary system: Secondary | ICD-10-CM | POA: Diagnosis not present

## 2014-10-01 DIAGNOSIS — I2111 ST elevation (STEMI) myocardial infarction involving right coronary artery: Secondary | ICD-10-CM | POA: Insufficient documentation

## 2014-10-01 DIAGNOSIS — R310 Gross hematuria: Secondary | ICD-10-CM | POA: Diagnosis not present

## 2014-10-01 DIAGNOSIS — Z9861 Coronary angioplasty status: Secondary | ICD-10-CM | POA: Insufficient documentation

## 2014-10-01 LAB — MICROSCOPIC EXAMINATION: Bacteria, UA: NONE SEEN

## 2014-10-01 LAB — URINALYSIS, COMPLETE
Bilirubin, UA: NEGATIVE
Glucose, UA: NEGATIVE
KETONES UA: NEGATIVE
NITRITE UA: NEGATIVE
Protein, UA: NEGATIVE
SPEC GRAV UA: 1.02 (ref 1.005–1.030)
Urobilinogen, Ur: 0.2 mg/dL (ref 0.2–1.0)
pH, UA: 6.5 (ref 5.0–7.5)

## 2014-10-01 LAB — BLADDER SCAN AMB NON-IMAGING: Scan Result: 0

## 2014-10-01 NOTE — Progress Notes (Signed)
10/01/2014 4:06 PM   Sheri [name] 04/23/1965 [id number]  Referring provider: No referring provider defined for this encounter.  Chief Complaint  Patient presents with  . Urinary Frequency    referred by Dr. Kenton Kingfisher MD  . urinary frequency, pressure    left side  . Hematuria    gross    HPI: Patient is a 49 year old white female with a history of gross hematuria, left flank pain, and incontinence, frequent urination and bladder pressure who was referred to [place] by her primary care physician, Dr. Kenton Kingfisher.  In July, patient at episode of gross hematuria associated with bladder pressure and left lower quadrant pain that radiated into the left flank. She was prescribed an antibiotic and did not find relief.  She was then seen at urgent care and prescribed Cipro and pain medication.  Her urine culture was negative from the urgent care.  She is currently experiencing urinary frequency, urgency, dysuria, nocturia, leakage of urine, painful intercourse and a weak urinary stream.  She has denied any further gross hematuria. She does not have a prior history of nephrolithiasis. She does not have a prior history of GU malignancies.  She does have a family history of nephrolithiasis. Her brother has been diagnosed with renal stones. There is no family history of GU malignancies.  She denies any fevers, chills, or vomiting. She is having some nausea.   Her UA is negative for hematuria at today's visit. Her PVR is 0 mL.   PMH: Past Medical History  Diagnosis Date  . Hypertension   . Heart attack 03/2014  . History of C-section   . Hx of heart artery stent   . Heart disease   . HLD (hyperlipidemia)     Surgical History: Past Surgical History  Procedure Laterality Date  . Cesarean section    . Left heart catheterization with coronary angiogram N/A 04/06/2014    Procedure: LEFT HEART CATHETERIZATION WITH CORONARY ANGIOGRAM;  Surgeon: Jettie Booze, MD;  Location: [facility] CATH LAB;  Service:  Cardiovascular;  Laterality: N/A;  . Cardiac surgery    . Cholecystectomy      Home Medications:    Medication List       This list is accurate as of: 10/01/14  4:06 PM.  Always use your most recent med list.               ALIGN 4 MG Caps  Take 4 mg by mouth 2 (two) times daily.     aspirin 81 MG chewable tablet  Chew 1 tablet (81 mg total) by mouth daily.     ciprofloxacin 500 MG tablet  Commonly known as:  CIPRO  Take 1 tablet (500 mg total) by mouth 2 (two) times daily.     clopidogrel 75 MG tablet  Commonly known as:  PLAVIX  TAKE 1 TABLET (75 MG TOTAL) BY MOUTH DAILY.     HYDROcodone-acetaminophen 5-325 MG per tablet  Commonly known as:  NORCO/VICODIN  Take 1-2 tablets by mouth every 6 (six) hours as needed.     isosorbide mononitrate 30 MG 24 hr tablet  Commonly known as:  IMDUR  Take 1 tablet (30 mg total) by mouth daily.     lisinopril 5 MG tablet  Commonly known as:  PRINIVIL,ZESTRIL  Take 1 tablet (5 mg total) by mouth daily.     metoprolol tartrate 25 MG tablet  Commonly known as:  LOPRESSOR  Take 1 tablet (25 mg total) by mouth 2 (two) times  daily.     nitroGLYCERIN 0.4 MG SL tablet  Commonly known as:  NITROSTAT  Place 1 tablet (0.4 mg total) under the tongue every 5 (five) minutes x 3 doses as needed for chest pain.     ondansetron 8 MG disintegrating tablet  Commonly known as:  ZOFRAN ODT  Take 1 tablet (8 mg total) by mouth 2 (two) times daily.     rosuvastatin 10 MG tablet  Commonly known as:  CRESTOR  Take 1 tablet (10 mg total) by mouth daily.        Allergies: No Known Allergies  Family History: Family History  Problem Relation Age of Onset  . Heart attack Father 2    died of MI at age 13  . Hypertension Mother   . Hyperlipidemia Mother   . Kidney disease Neg Hx   . Bladder Cancer Neg Hx   . Stroke Paternal Grandfather   . Liver cancer Maternal Grandmother     Social History:  reports that she has quit smoking. She does  not have any smokeless tobacco history on file. She reports that she does not drink alcohol or use illicit drugs.  ROS: UROLOGY Frequent Urination?: Yes Hard to postpone urination?: Yes Burning/pain with urination?: Yes Get up at night to urinate?: Yes Leakage of urine?: Yes Urine stream starts and stops?: No Trouble starting stream?: No Do you have to strain to urinate?: No Blood in urine?: Yes Urinary tract infection?: No Sexually transmitted disease?: No Injury to kidneys or bladder?: No Painful intercourse?: Yes Weak stream?: Yes Currently pregnant?: Yes Vaginal bleeding?: No Last menstrual period?: n  Gastrointestinal Nausea?: Yes Vomiting?: No Indigestion/heartburn?: No Diarrhea?: No Constipation?: No  Constitutional Fever: No Night sweats?: No Weight loss?: No Fatigue?: No  Skin Skin rash/lesions?: No Itching?: Yes  Eyes Blurred vision?: No Double vision?: No  Ears/Nose/Throat Sore throat?: No Sinus problems?: No  Hematologic/Lymphatic Swollen glands?: No Easy bruising?: Yes  Cardiovascular Leg swelling?: Yes Chest pain?: Yes  Respiratory Cough?: No Shortness of breath?: Yes  Endocrine Excessive thirst?: Yes  Musculoskeletal Back pain?: No Joint pain?: No  Neurological Headaches?: No Dizziness?: No  Psychologic Depression?: Yes Anxiety?: Yes  Physical Exam: BP 117/73 mmHg  Pulse 69  Ht 5\' 5"  (1.651 m)  Wt 240 lb 14.4 oz (109.272 kg)  BMI 40.09 kg/m2  Constitutional:  Alert and oriented, No acute distress. HEENT: [place] AT, moist mucus membranes.  Trachea midline, no masses. Cardiovascular: No clubbing, cyanosis, or edema. Respiratory: Normal respiratory effort, no increased work of breathing. GI: Abdomen is soft, nontender, nondistended, no abdominal masses GU: No CVA tenderness.  Skin: No rashes, bruises or suspicious lesions. Lymph: No cervical or inguinal adenopathy. Neurologic: Grossly intact, no focal deficits, moving all  4 extremities. Psychiatric: Normal mood and affect.  Laboratory Data:  Lab Results  Component Value Date   WBC 8.4 05/07/2014   HGB 12.9 05/07/2014   HCT 38.6 05/07/2014   MCV 85 05/07/2014   PLT 245 05/07/2014    Lab Results  Component Value Date   CREATININE 0.63 05/26/2014    Lab Results  Component Value Date   HGBA1C 5.6 04/06/2014    Urinalysis    Component Value Date/Time   COLORURINE ORANGE* 09/11/2014 1918   COLORURINE YELLOW 05/07/2014 1124   APPEARANCEUR HAZY* 09/11/2014 1918   APPEARANCEUR HAZY 05/07/2014 1124   LABSPEC 1.020 09/11/2014 1918   LABSPEC 1.010 05/07/2014 1124   PHURINE 5.0 09/11/2014 1918   PHURINE 7.0 05/07/2014 1124  GLUCOSEU * 09/11/2014 1918    TEST NOT REPORTED DUE TO COLOR INTERFERENCE OF URINE PIGMENT   GLUCOSEU NEGATIVE 05/07/2014 1124   HGBUR * 09/11/2014 1918    TEST NOT REPORTED DUE TO COLOR INTERFERENCE OF URINE PIGMENT   HGBUR 1+ 05/07/2014 1124   BILIRUBINUR * 09/11/2014 1918    TEST NOT REPORTED DUE TO COLOR INTERFERENCE OF URINE PIGMENT   BILIRUBINUR NEGATIVE 05/07/2014 1124   KETONESUR * 09/11/2014 1918    TEST NOT REPORTED DUE TO COLOR INTERFERENCE OF URINE PIGMENT   KETONESUR NEGATIVE 05/07/2014 1124   PROTEINUR * 09/11/2014 1918    TEST NOT REPORTED DUE TO COLOR INTERFERENCE OF URINE PIGMENT   PROTEINUR 30 mg/dL 05/07/2014 1124   NITRITE * 09/11/2014 1918    TEST NOT REPORTED DUE TO COLOR INTERFERENCE OF URINE PIGMENT   NITRITE NEGATIVE 05/07/2014 1124   LEUKOCYTESUR * 09/11/2014 1918    TEST NOT REPORTED DUE TO COLOR INTERFERENCE OF URINE PIGMENT   LEUKOCYTESUR NEGATIVE 05/07/2014 1124     Pertinent Imaging: Results for orders placed or performed in visit on 10/01/14  Microscopic Examination  Result Value Ref Range   WBC, UA 0-5 0 -  5 /hpf   RBC, UA 0-2 0 -  2 /hpf   Epithelial Cells (non renal) 0-10 0 - 10 /hpf   Mucus, UA Present (A) Not Estab.   Bacteria, UA None seen None seen/Few  Urinalysis,  Complete  Result Value Ref Range   Specific Gravity, UA 1.020 1.005 - 1.030   pH, UA 6.5 5.0 - 7.5   Color, UA Yellow Yellow   Appearance Ur Clear Clear   Leukocytes, UA Trace (A) Negative   Protein, UA Negative Negative/Trace   Glucose, UA Negative Negative   Ketones, UA Negative Negative   RBC, UA Trace (A) Negative   Bilirubin, UA Negative Negative   Urobilinogen, Ur 0.2 0.2 - 1.0 mg/dL   Nitrite, UA Negative Negative   Microscopic Examination See below:   BLADDER SCAN AMB NON-IMAGING  Result Value Ref Range   Scan Result 0     Assessment & Plan:    1. Gross hematuria:   Explained to patient the causes of blood in the urine are as follows: stones, UTI's, damage to the urinary tract and/or cancer.   It is explained to the patient that they will be scheduled for a CT Urogram with contrast material and that in rare instances, an allergic reaction can be serious and even life threatening with the injection of contrast material.   The patient denies any allergies to contrast, iodine and/or seafood and is not taking metformin.  2. LUTS:   Patient's urinary symptoms did not improve with taking antibiotics and urine culture was negative.  Patient will undergo CT urogram for further evaluation for the hematuria and lower urinary tract symptoms. She will be returning to discuss CT report.  - Urinalysis, Complete - BLADDER SCAN AMB NON-IMAGING   No Follow-up on file.  Zara Council, [facility] [address], [place], [place] [address] [phone]

## 2014-10-04 DIAGNOSIS — R310 Gross hematuria: Secondary | ICD-10-CM | POA: Insufficient documentation

## 2014-10-04 DIAGNOSIS — R350 Frequency of micturition: Secondary | ICD-10-CM | POA: Insufficient documentation

## 2014-10-08 ENCOUNTER — Encounter: Payer: Self-pay | Admitting: *Deleted

## 2014-10-13 ENCOUNTER — Encounter: Payer: Self-pay | Admitting: *Deleted

## 2014-10-13 DIAGNOSIS — I2111 ST elevation (STEMI) myocardial infarction involving right coronary artery: Secondary | ICD-10-CM

## 2014-10-13 NOTE — Progress Notes (Signed)
Cardiac Individual Treatment Plan  Patient Details  Name: Sheri [name] MRN: [id number] Date of Birth: [birth year] Referring Provider:  Jettie Booze, MD  Initial Encounter Date:    Visit Diagnosis: ST elevation myocardial infarction involving right coronary artery  Patient's Home Medications on Admission:  Current outpatient prescriptions:  .  aspirin 81 MG chewable tablet, Chew 1 tablet (81 mg total) by mouth daily., Disp: , Rfl:  .  ciprofloxacin (CIPRO) 500 MG tablet, Take 1 tablet (500 mg total) by mouth 2 (two) times daily. (Patient not taking: Reported on 10/01/2014), Disp: 6 tablet, Rfl: 0 .  clopidogrel (PLAVIX) 75 MG tablet, TAKE 1 TABLET (75 MG TOTAL) BY MOUTH DAILY., Disp: 30 tablet, Rfl: 5 .  HYDROcodone-acetaminophen (NORCO/VICODIN) 5-325 MG per tablet, Take 1-2 tablets by mouth every 6 (six) hours as needed., Disp: 12 tablet, Rfl: 0 .  isosorbide mononitrate (IMDUR) 30 MG 24 hr tablet, Take 1 tablet (30 mg total) by mouth daily., Disp: 90 tablet, Rfl: 3 .  lisinopril (PRINIVIL,ZESTRIL) 5 MG tablet, Take 1 tablet (5 mg total) by mouth daily., Disp: 90 tablet, Rfl: 3 .  metoprolol tartrate (LOPRESSOR) 25 MG tablet, Take 1 tablet (25 mg total) by mouth 2 (two) times daily., Disp: 180 tablet, Rfl: 3 .  nitroGLYCERIN (NITROSTAT) 0.4 MG SL tablet, Place 1 tablet (0.4 mg total) under the tongue every 5 (five) minutes x 3 doses as needed for chest pain., Disp: 25 tablet, Rfl: 2 .  ondansetron (ZOFRAN ODT) 8 MG disintegrating tablet, Take 1 tablet (8 mg total) by mouth 2 (two) times daily. (Patient not taking: Reported on 10/01/2014), Disp: 6 tablet, Rfl: 0 .  Probiotic Product (ALIGN) 4 MG CAPS, Take 4 mg by mouth 2 (two) times daily., Disp: , Rfl:  .  rosuvastatin (CRESTOR) 10 MG tablet, Take 1 tablet (10 mg total) by mouth daily., Disp: 30 tablet, Rfl: 6  Past Medical History: Past Medical History  Diagnosis Date  . Hypertension   . Heart attack 03/2014  . History of  C-section   . Hx of heart artery stent   . Heart disease   . HLD (hyperlipidemia)     Tobacco Use: History  Smoking status  . Former Smoker  Smokeless tobacco  . Not on file    Comment: quit 20 years    Labs: Recent Review Scientist, physiological    Labs for ITP Cardiac and Pulmonary Rehab Latest Ref Rng 04/06/2014 04/07/2014 07/27/2014   Cholestrol 0 - 200 mg/dL - 179 130   LDLCALC 0 - 99 mg/dL - 121(H) 76   HDL >39.00 mg/dL - 33(L) 29.10(L)   Trlycerides 0.0 - 149.0 mg/dL - 125 124.0   Hemoglobin A1c 4.8 - 5.6 % 5.6 - -   TCO2 0 - 100 mmol/L 21 - -       Exercise Target Goals:    Exercise Program Goal: Individual exercise prescription set with THRR, safety & activity barriers. Participant demonstrates ability to understand and report RPE using BORG scale, to self-measure pulse accurately, and to acknowledge the importance of the exercise prescription.  Exercise Prescription Goal: Starting with aerobic activity 30 plus minutes a day, 3 days per week for initial exercise prescription. Provide home exercise prescription and guidelines that participant acknowledges understanding prior to discharge.  Activity Barriers & Risk Stratification:     Activity Barriers & Risk Stratification - 07/20/14 1405    Activity Barriers & Risk Stratification   Activity Barriers None;Deconditioning   Risk Stratification High  6 Minute Walk:     6 Minute Walk      07/20/14 1514       6 Minute Walk   Phase Initial     Distance 1525 feet     Walk Time 6 minutes     Resting HR 67 bpm     Resting BP 138/80 mmHg     Max Ex. HR 106 bpm     Max Ex. BP 160/90 mmHg     RPE 9     Symptoms No        Initial Exercise Prescription:     Initial Exercise Prescription - 07/20/14 1500    Date of Initial Exercise Prescription   Date 07/20/14   Treadmill   MPH 2.8   Grade 0   Minutes 15   Bike   Level 1.5   Minutes 15   Recumbant Bike   Level 3   Watts 30   Minutes 15   NuStep    Level 4   Watts 40   Minutes 15   Arm Ergometer   Level 2   Watts 10   Minutes 15   Arm/Foot Ergometer   Level 2   Watts 15   Minutes 15   Cybex   Level 2   RPM 40   Minutes 15   Recumbant Elliptical   Level 2   Watts 30   Minutes 15   Elliptical   Level 1   Speed 3   Minutes 10   REL-XR   Level 3   Watts 30   Minutes 15   Prescription Details   Frequency (times per week) 3   Duration Progress to 30 minutes of continuous aerobic without signs/symptoms of physical distress   Intensity   THRR REST +  30   Ratings of Perceived Exertion 11-13   Progression Continue progressive overload as per policy without signs/symptoms or physical distress.   Resistance Training   Training Prescription Yes   Weight 2   Reps 10-12      Exercise Prescription Changes:     Exercise Prescription Changes      08/11/14 0600 09/07/14 1100 10/08/14 1400       Exercise Review   Progression Yes No  Only one visit since last review (08/13/14) (p) No  Only one visit since last review (08/13/14)     Response to Exercise   Blood Pressure (Admit) 122/80 mmHg 138/80 mmHg (p) 138/80 mmHg     Blood Pressure (Exercise) 152/70 mmHg 142/82 mmHg (p) 142/82 mmHg     Blood Pressure (Exit) 132/78 mmHg 106/70 mmHg (p) 106/70 mmHg     Heart Rate (Admit) 75 bpm 80 bpm (p) 80 bpm     Heart Rate (Exercise) 127 bpm 114 bpm (p) 114 bpm     Heart Rate (Exit) 74 bpm 68 bpm (p) 68 bpm     Symptoms No No (p) No     Duration Progress to 50 minutes of aerobic without signs/symptoms of physical distress Progress to 50 minutes of aerobic without signs/symptoms of physical distress (p) Progress to 50 minutes of aerobic without signs/symptoms of physical distress     Intensity Other (comment)  40-85% HRR 114-159 Other (comment)  40-85% HRR 114-159 (p) Other (comment)  40-85% HRR 114-159     Progression Continue progressive overload as per policy without signs/symptoms or physical distress. Continue progressive  overload as per policy without signs/symptoms or physical distress. (p) Continue progressive overload as per  policy without signs/symptoms or physical distress.     Resistance Training   Training Prescription Yes Yes (p) Yes     Weight 5 5 (p) 5     Reps 10-15 10-15 (p) 10-15     Interval Training   Interval Training Yes Yes (p) Yes     Equipment Treadmill Treadmill (p) Treadmill     Treadmill   MPH 3.2 3.2 (p) 3.2     Grade 2 2 (p) 2     Minutes 25 25 (p) 25     Elliptical   Level 2 2 (p) 2     Speed 3.5 3.5 (p) 3.5     Minutes 20 20 (p) 20        Discharge Exercise Prescription (Final Exercise Prescription Changes):     Exercise Prescription Changes - 10/08/14 1400    Exercise Review   Progression (p) No  Only one visit since last review (08/13/14)   Response to Exercise   Blood Pressure (Admit) (p) 138/80 mmHg   Blood Pressure (Exercise) (p) 142/82 mmHg   Blood Pressure (Exit) (p) 106/70 mmHg   Heart Rate (Admit) (p) 80 bpm   Heart Rate (Exercise) (p) 114 bpm   Heart Rate (Exit) (p) 68 bpm   Symptoms (p) No   Duration (p) Progress to 50 minutes of aerobic without signs/symptoms of physical distress   Intensity (p) Other (comment)  40-85% HRR 114-159   Progression (p) Continue progressive overload as per policy without signs/symptoms or physical distress.   Resistance Training   Training Prescription (p) Yes   Weight (p) 5   Reps (p) 10-15   Interval Training   Interval Training (p) Yes   Equipment (p) Treadmill   Treadmill   MPH (p) 3.2   Grade (p) 2   Minutes (p) 25   Elliptical   Level (p) 2   Speed (p) 3.5   Minutes (p) 20      Nutrition:  Target Goals: Understanding of nutrition guidelines, daily intake of sodium '1500mg'$ , cholesterol '200mg'$ , calories 30% from fat and 7% or less from saturated fats, daily to have 5 or more servings of fruits and vegetables.  Biometrics:     Pre Biometrics - 07/20/14 1522    Pre Biometrics   Height 5' 6.25" (1.683  m)   Weight 237 lb 12.8 oz (107.865 kg)   Waist Circumference 41 inches   Hip Circumference 51.5 inches   Waist to Hip Ratio 0.8 %   BMI (Calculated) 38.2       Nutrition Therapy Plan and Nutrition Goals:     Nutrition Therapy & Goals - 08/11/14 1817    Nutrition Therapy   Diet 1200kcal DASH   Drug/Food Interactions Statins/Certain Fruits   Fiber 25 grams   Whole Grain Foods 3 servings   Protein 6 ounces/day   Saturated Fats 10 max. grams   Fruits and Vegetables 8 servings/day   Personal Nutrition Goals   Personal Goal #1 follow 1200kcal goal for weight loss   Personal Goal #2 continue with healthy food choices      Nutrition Discharge: Rate Your Plate Scores:     Rate Your Plate - 94/76/54 6503    Rate Your Plate Scores   Pre Score 59   Pre Score % 66 %      Nutrition Goals Re-Evaluation:     Nutrition Goals Re-Evaluation      08/13/14 1108  Personal Goal #1 Re-Evaluation   Personal Goal #1 Is following the 1200 kcal diet.       Personal Goal #2 Re-Evaluation   Goal Progress Seen Yes          Psychosocial: Target Goals: Acknowledge presence or absence of depression, maximize coping skills, provide positive support system. Participant is able to verbalize types and ability to use techniques and skills needed for reducing stress and depression.  Initial Review & Psychosocial Screening:   Quality of Life Scores:   PHQ-9:     Recent Review Flowsheet Data    Depression screen [facility] 2/9 07/20/2014   Decreased Interest 0   Down, Depressed, Hopeless 1   PHQ - 2 Score 1   Altered sleeping 1   Tired, decreased energy 3   Change in appetite 2   Feeling bad or failure about yourself  2   Trouble concentrating 3   Moving slowly or fidgety/restless 0   Suicidal thoughts 0   PHQ-9 Score 12   Difficult doing work/chores Somewhat difficult      Psychosocial Evaluation and Intervention:     Psychosocial Evaluation - 08/11/14 0937     Psychosocial Evaluation & Interventions   Interventions Therapist referral;Stress management education;Relaxation education;Encouraged to exercise with the program and follow exercise prescription   Comments Counselor met with Sheri [name] today for initial psychosocial evaluation.  She is a 49 year old who recently had a heart attacks and has a family history of heart disease.  Sheri [name] has a strong support system with a spouse of 19 years.  She states she is sleeping okay and eating "too much."  She  reports a history of situational depression following the death of her father 18 years ago and some current symptoms since her heart attack.  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  Due to her current  stressors of health issues, daughter going off to college and her current symptoms, Counselor recommended a therapist and gave Sheri [name] the contact information.  Counselor also assessed stress management techniques and practiced breathing techniques for relaxation as well as some visual imagery.  Sheri [name] will benefit from meeting with the dietician to work on her weight loss goals.  She will also benefit from the psychoeducational components of this program as well as consistent exercise.     Continued Psychosocial Services Needed Yes  Sheri [name] will benefit from meeting with the dietician, participating in the psychoeducational components of this program - especially stress management, depression and relaxation techniques.  She was given contact info for a local therapist.        Psychosocial Re-Evaluation:   Vocational Rehabilitation: Provide vocational rehab assistance to qualifying candidates.   Vocational Rehab Evaluation & Intervention:     Vocational Rehab - 07/20/14 1405    Initial Vocational Rehab Evaluation & Intervention   Assessment shows need for Vocational Rehabilitation No      Education: Education Goals: Education classes will be provided on a  weekly basis, covering required topics. Participant will state understanding/return demonstration of topics presented.  Learning Barriers/Preferences:     Learning Barriers/Preferences - 07/20/14 1405    Learning Barriers/Preferences   Learning Barriers None   Learning Preferences None      Education Topics: General Nutrition Guidelines/Fats and Fiber: -Group instruction provided by verbal, written material, models and posters to present the general guidelines for heart healthy nutrition. Gives an explanation and review of dietary  fats and fiber.          Cardiac Rehab from 08/13/2014 in [facility] Cardiac Rehab   Date  07/21/14   Educator  CR   Instruction Review Code  2- meets goals/outcomes      Controlling Sodium/Reading Food Labels: -Group verbal and written material supporting the discussion of sodium use in heart healthy nutrition. Review and explanation with models, verbal and written materials for utilization of the food label.      Cardiac Rehab from 08/13/2014 in [facility] Cardiac Rehab   Date  07/28/14   Educator  CR   Instruction Review Code  2- meets goals/outcomes      Exercise Physiology & Risk Factors: - Group verbal and written instruction with models to review the exercise physiology of the cardiovascular system and associated critical values. Details cardiovascular disease risk factors and the goals associated with each risk factor.      Cardiac Rehab from 08/13/2014 in [facility] Cardiac Rehab   Date  08/13/14   Educator  Harl Favor      Aerobic Exercise & Resistance Training: - Gives group verbal and written discussion on the health impact of inactivity. On the components of aerobic and resistive training programs and the benefits of this training and how to safely progress through these programs.   Flexibility, Balance, General Exercise Guidelines: - Provides group verbal and written instruction on the benefits of flexibility and balance training programs.  Provides general exercise guidelines with specific guidelines to those with heart or lung disease. Demonstration and skill practice provided.   Stress Management: - Provides group verbal and written instruction about the health risks of elevated stress, cause of high stress, and healthy ways to reduce stress.   Depression: - Provides group verbal and written instruction on the correlation between heart/lung disease and depressed mood, treatment options, and the stigmas associated with seeking treatment.   Anatomy & Physiology of the Heart: - Group verbal and written instruction and models provide basic cardiac anatomy and physiology, with the coronary electrical and arterial systems. Review of: AMI, Angina, Valve disease, Heart Failure, Cardiac Arrhythmia, Pacemakers, and the ICD.   Cardiac Procedures: - Group verbal and written instruction and models to describe the testing methods done to diagnose heart disease. Reviews the outcomes of the test results. Describes the treatment choices: Medical Management, Angioplasty, or Coronary Bypass Surgery.   Cardiac Medications: - Group verbal and written instruction to review commonly prescribed medications for heart disease. Reviews the medication, class of the drug, and side effects. Includes the steps to properly store meds and maintain the prescription regimen.      Cardiac Rehab from 08/13/2014 in [facility] Cardiac Rehab   Date  08/11/14   Educator  DW   Instruction Review Code  2- meets goals/outcomes      Go Sex-Intimacy & Heart Disease, Get SMART - Goal Setting: - Group verbal and written instruction through game format to discuss heart disease and the return to sexual intimacy. Provides group verbal and written material to discuss and apply goal setting through the application of the S.M.A.R.T. Method.   Other Matters of the Heart: - Provides group verbal, written materials and models to describe Heart Failure, Angina, Valve Disease, and  Diabetes in the realm of heart disease. Includes description of the disease process and treatment options available to the cardiac patient.   Exercise & Equipment Safety: - Individual verbal instruction and demonstration of equipment use and safety with use of the equipment.  Infection Prevention: - Provides verbal and written material to individual with discussion of infection control including proper hand washing and proper equipment cleaning during exercise session.   Falls Prevention: - Provides verbal and written material to individual with discussion of falls prevention and safety.   Diabetes: - Individual verbal and written instruction to review signs/symptoms of diabetes, desired ranges of glucose level fasting, after meals and with exercise. Advice that pre and post exercise glucose checks will be done for 3 sessions at entry of program.    Knowledge Questionnaire Score:   Personal Goals and Risk Factors at Admission:     Personal Goals and Risk Factors at Admission - 07/20/14 1406    Personal Goals and Risk Factors on Admission    Weight Management Obesity;Yes   Intervention Learn and follow the exercise and diet guidelines while in the program. Utilize the nutrition and education classes to help gain knowledge of the diet and exercise expectations in the program   Intervention Provide weight management tools through evaluation completed by registered dietician and exercise physiologist.  Establish a goal weight with participant.   Increase Aerobic Exercise and Physical Activity Yes   Intervention While in program, learn and follow the exercise prescription taught. Start at a low level workload and increase workload after able to maintain previous level for 30 minutes. Increase time before increasing intensity.   Understand more about Heart/Pulmonary Disease. Yes   Intervention While in program utilize professionals for any questions, and attend the education sessions.  Great websites to use are www.americanheart.org or www.lung.org for reliable information.   Diabetes No   Hypertension Yes   Goal Participant will see blood pressure controlled within the values of 140/80mm/Hg or within value directed by their physician.   Intervention Provide nutrition & aerobic exercise along with prescribed medications to achieve BP 140/90 or less.   Lipids Yes   Goal Cholesterol controlled with medications as prescribed, with individualized exercise RX and with personalized nutrition plan. Value goals: LDL < $Rem'70mg'QSpB$ , HDL > $Rem'40mg'Ksss$ . Participant states understanding of desired cholesterol values and following prescriptions.   Intervention Provide nutrition & aerobic exercise along with prescribed medications to achieve LDL '70mg'$ , HDL >$Remo'40mg'XoNit$ .   Stress Yes   Goal To meet with psychosocial counselor for stress and relaxation information and guidance. To state understanding of performing relaxation techniques and or identifying personal stressors.   Intervention Provide education on types of stress, identifiying stressors, and ways to cope with stress. Provide demonstration and active practice of relaxation techniques.      Personal Goals and Risk Factors Review:      Goals and Risk Factor Review      08/13/14 1107           Increase Aerobic Exercise and Physical Activity   Goals Progress/Improvement seen  Yes       Hypertension   Goal Participant will see blood pressure controlled within the values of 140/6mm/Hg or within value directed by their physician.       Stress   Goal --  Met with Cardiac Rehab counselor.           Personal Goals Discharge:     Comments: 30 day review HAs been out over 6 weeks  Medical concerns this month. Continue with ITP

## 2014-10-14 ENCOUNTER — Other Ambulatory Visit: Payer: Self-pay | Admitting: *Deleted

## 2014-10-14 DIAGNOSIS — Z9861 Coronary angioplasty status: Secondary | ICD-10-CM

## 2014-10-14 DIAGNOSIS — I2111 ST elevation (STEMI) myocardial infarction involving right coronary artery: Secondary | ICD-10-CM

## 2014-10-19 ENCOUNTER — Encounter: Payer: Self-pay | Admitting: Urology

## 2014-10-19 ENCOUNTER — Ambulatory Visit: Payer: BLUE CROSS/BLUE SHIELD | Admitting: Urology

## 2014-10-25 ENCOUNTER — Encounter: Payer: Self-pay | Admitting: Gynecology

## 2014-10-25 ENCOUNTER — Ambulatory Visit
Admission: EM | Admit: 2014-10-25 | Discharge: 2014-10-25 | Disposition: A | Discharge status: Home / Self Care | Payer: BLUE CROSS/BLUE SHIELD | Attending: Internal Medicine | Admitting: Internal Medicine

## 2014-10-25 DIAGNOSIS — K122 Cellulitis and abscess of mouth: Secondary | ICD-10-CM

## 2014-10-25 LAB — RAPID STREP SCREEN (MED CTR MEBANE ONLY): Streptococcus, Group A Screen (Direct): NEGATIVE

## 2014-10-25 MED ORDER — CEFDINIR 300 MG PO CAPS: 300.0000 mg | ORAL_CAPSULE | Freq: Two times a day (BID) | ORAL | Status: DC

## 2014-10-25 NOTE — ED Notes (Signed)
Patient c/o sore throat and chills x 5 days.

## 2014-10-25 NOTE — ED Provider Notes (Signed)
CSN: [id number]     Arrival date & time 10/25/14  1153 History   First MD Initiated Contact with Patient 10/25/14 1253     Chief Complaint  Patient presents with  . Sore Throat   HPI  49 yo lady with recent (03/2014) medical hx notable for MI; presents with 5d hx severe sore throat, achiness, tactile temps, after similar sx's in both of her young adult children (HS, college age). Taking ibuprofen with some relief.  No runny/congested nose, no cough.  No GI sx's.  Very achey.  Hoarse.    Past Medical History  Diagnosis Date  . Hypertension   . Heart attack 03/2014  . History of C-section   . Hx of heart artery stent   . Heart disease   . HLD (hyperlipidemia)    Past Surgical History  Procedure Laterality Date  . Cesarean section    . Left heart catheterization with coronary angiogram N/A 04/06/2014    Procedure: LEFT HEART CATHETERIZATION WITH CORONARY ANGIOGRAM;  Surgeon: Jettie Booze, MD;  Location: [facility] CATH LAB;  Service: Cardiovascular;  Laterality: N/A;  . Cardiac surgery    . Cholecystectomy     Family History  Problem Relation Age of Onset  . Heart attack Father 29    died of MI at age 3  . Hypertension Mother   . Hyperlipidemia Mother   . Kidney disease Neg Hx   . Bladder Cancer Neg Hx   . Stroke Paternal Grandfather   . Liver cancer Maternal Grandmother    Social History  Substance Use Topics  . Smoking status: Former Research scientist (life sciences)  . Smokeless tobacco: None     Comment: quit 20 years  . Alcohol Use: No    Review of Systems  All other systems reviewed and are negative.   Allergies  Review of patient's allergies indicates no known allergies.  Home Medications   Prior to Admission medications   Medication Sig Start Date End Date Taking? Authorizing Provider  aspirin 81 MG chewable tablet Chew 1 tablet (81 mg total) by mouth daily. 04/09/14  Yes Brittainy Erie Noe, PA-C  clopidogrel (PLAVIX) 75 MG tablet TAKE 1 TABLET (75 MG TOTAL) BY MOUTH DAILY. 07/08/14   Yes Jettie Booze, MD  isosorbide mononitrate (IMDUR) 30 MG 24 hr tablet Take 1 tablet (30 mg total) by mouth daily. 07/06/14  Yes Jettie Booze, MD  lisinopril (PRINIVIL,ZESTRIL) 5 MG tablet Take 1 tablet (5 mg total) by mouth daily. 05/04/14  Yes Jettie Booze, MD  metoprolol tartrate (LOPRESSOR) 25 MG tablet Take 1 tablet (25 mg total) by mouth 2 (two) times daily. 05/05/14  Yes Jettie Booze, MD  nitroGLYCERIN (NITROSTAT) 0.4 MG SL tablet Place 1 tablet (0.4 mg total) under the tongue every 5 (five) minutes x 3 doses as needed for chest pain. 04/09/14  Yes Brittainy Erie Noe, PA-C  Probiotic Product (ALIGN) 4 MG CAPS Take 4 mg by mouth 2 (two) times daily.   Yes Historical Provider, MD  rosuvastatin (CRESTOR) 10 MG tablet Take 1 tablet (10 mg total) by mouth daily. 06/24/14  Yes Burtis Junes, NP  cefdinir (OMNICEF) 300 MG capsule Take 1 capsule (300 mg total) by mouth 2 (two) times daily. 10/25/14   Sherlene Shams, MD  ciprofloxacin (CIPRO) 500 MG tablet Take 1 tablet (500 mg total) by mouth 2 (two) times daily. Patient not taking: Reported on 10/01/2014 09/11/14   Norval Gable, MD  HYDROcodone-acetaminophen (NORCO/VICODIN) 5-325 MG per tablet  Take 1-2 tablets by mouth every 6 (six) hours as needed. 09/11/14   Norval Gable, MD  ondansetron (ZOFRAN ODT) 8 MG disintegrating tablet Take 1 tablet (8 mg total) by mouth 2 (two) times daily. Patient not taking: Reported on 10/01/2014 09/11/14   Norval Gable, MD   Meds Ordered and Administered this Visit  Medications - No data to display  BP 127/81 mmHg  Pulse 71  Temp(Src) 97.2 F (36.2 C) (Tympanic)  Resp 18  Ht 5\' 4"  (1.626 m)  Wt 238 lb (107.956 kg)  BMI 40.83 kg/m2  SpO2 98% No data found.   Physical Exam  Constitutional: She is oriented to person, place, and time. No distress.  Alert, nicely groomed Sitting up on the side of the stretcher Voice is quite hoarse  HENT:  Head: Atraumatic.  Bilateral TMs mild to  moderate dullness, no erythema Moderate nasal congestion Throat red with marked tonsillar enlargement, scant exudates, and involvement of the uvula.  Eyes:  Conjugate gaze, no eye redness/drainage  Neck: Neck supple.  Cardiovascular: Normal rate and regular rhythm.   Pulmonary/Chest: No respiratory distress. She has no wheezes. She has no rales.  Lungs clear, symmetric breath sounds  Abdominal: She exhibits no distension.  Musculoskeletal: Normal range of motion.  No leg swelling  Neurological: She is alert and oriented to person, place, and time.  Skin: Skin is warm and dry.  Pink. No cyanosis  Nursing note and vitals reviewed.   ED Course  Procedures (including critical care time)   Results for orders placed or performed during the hospital encounter of 10/25/14  Rapid strep screen  Result Value Ref Range   Streptococcus, Group A Screen (Direct) NEGATIVE NEGATIVE   Throat culture pending   MDM   1. Uvulitis with tonsillitis: no history of diabetes. Involvement of uvula with tonsillitis raise his risk of bacterial infection according to up-to-date website. Prescription for Omnicef sent to the [facility] in [place]. Recheck for persistent/worsening symptoms, new fever greater than 100.5.     Discharge Medication List as of 10/25/2014  1:04 PM    START taking these medications   Details  cefdinir (OMNICEF) 300 MG capsule Take 1 capsule (300 mg total) by mouth 2 (two) times daily., Starting 10/25/2014, Until Discontinued, Normal           Sherlene Shams, MD 10/25/14 [phone]

## 2014-10-25 NOTE — Discharge Instructions (Signed)
Strep swab was negative, and a throat culture is pending. Prescription for omnicef (cefdinir, an antibiotic) was sent to the [facility] in [place], because of uvulitis associated with your sore throat/tonsillitis, which increases the chances that you have a bacterial infection. Recheck for new fever >100.5, worsening sore throat, or if not starting to improve in a few days.

## 2014-10-27 LAB — CULTURE, GROUP A STREP (THRC)

## 2014-11-05 ENCOUNTER — Encounter: Payer: Self-pay | Admitting: *Deleted

## 2014-11-05 ENCOUNTER — Telehealth: Payer: Self-pay | Admitting: *Deleted

## 2014-11-05 DIAGNOSIS — I2111 ST elevation (STEMI) myocardial infarction involving right coronary artery: Secondary | ICD-10-CM

## 2014-11-05 NOTE — Telephone Encounter (Signed)
I left vm for Sheri [name] " "Sheri [name]" a vm to see if she was planning on returning.
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
## 2014-11-11 NOTE — Addendum Note (Signed)
Addended by: Gerlene Burdock on: 11/11/2014 02:07 PM   Modules accepted: Orders
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
## 2014-11-23 ENCOUNTER — Ambulatory Visit (INDEPENDENT_AMBULATORY_CARE_PROVIDER_SITE_OTHER): Payer: BLUE CROSS/BLUE SHIELD | Admitting: Interventional Cardiology

## 2014-11-23 ENCOUNTER — Encounter: Payer: Self-pay | Admitting: Interventional Cardiology

## 2014-11-23 VITALS — BP 118/80 | HR 78 | Ht 64.0 in | Wt 246.0 lb

## 2014-11-23 DIAGNOSIS — E669 Obesity, unspecified: Secondary | ICD-10-CM | POA: Diagnosis not present

## 2014-11-23 DIAGNOSIS — R0602 Shortness of breath: Secondary | ICD-10-CM

## 2014-11-23 DIAGNOSIS — E785 Hyperlipidemia, unspecified: Secondary | ICD-10-CM | POA: Diagnosis not present

## 2014-11-23 DIAGNOSIS — I2510 Atherosclerotic heart disease of native coronary artery without angina pectoris: Secondary | ICD-10-CM

## 2014-11-23 NOTE — Patient Instructions (Addendum)
Medication Instructions:  Same-no changes  Labwork: None  Testing/Procedures: None  Follow-Up: Depending on test result

## 2014-11-23 NOTE — Progress Notes (Signed)
Patient ID: Sheri [name], female   DOB: Mar 01, 1965, 49 y.o.   MRN: [id number]     Cardiology Office Note   Date:  11/23/2014   ID:  Sheri [name], DOB 10-14-1965, MRN [id number]  PCP:  No PCP Per Patient    No chief complaint on file. f/u CAD   Wt Readings from Last 3 Encounters:  11/23/14 246 lb (111.585 kg)  10/25/14 238 lb (107.956 kg)  10/01/14 240 lb 14.4 oz (109.272 kg)       History of Present Illness: Sheri [name] is a 49 y.o. female  has known CAD with an inferior MI in 3/16. She had a stent to the RCA. 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 At the time of her MI, she had excessive sweatiness and nausea along with tooth pain. She was lightheaded and felt that she would pass out. She got home and had left arm, left jaw and chest pressure. She knew it was an MI and came to the hospital.   She has been seen back several times since her initial event - very anxious about her situation. She is concerned because her father died from a heart attack at age 56. She is concerned about another cardiac event occurring.  She visited the emergency room at [facility] on May 07, 2014. At that time, she had been having some blood in her urine along with some back pain and shortness of breath. She described pain in her right side of her back. She was in the emergency room and had a negative troponin. They did find that her total CK was elevated at 1488. She had a negative chest x-ray. They were not concerned about ischemia. They decreased her atorvastatin from 80 mg down to 40 mg.   She admits to having a lot of emotional difficulty with her cardiac event. She saw [facility] regarding this and by May 2016, she was asymptomatic in terms of chest discomfort.  Her most significant issue over the last few months has been weight gain and shortness of breath. She feels that with minimal activity, she gets short of breath. Today, she did not report any chest discomfort, but  stated that walking short distances brings on shortness of breath. She tried to do cardiac rehabilitation but just didn't feel well. Both she and her husband are concerned that there is another blockage present.  We have had long discussions regarding whether or not the anomalous circumflex would have significant disease. The husband reports that the patient does snore and stop breathing at times at night but he states it is "not that bad." I did explain to him that there is a formal sleep study process which actually quantitates the number of apneic episodes that occur. The husband is quite adamant that he feels something is wrong with her heart. Because their deductible has been paid for the year, he would like to get whatever testing is required this calendar year.  The patient does not report symptoms like what she had with her MI. It is now shortness of breath which is her biggest complaint along with weight gain. She feels that she is not able to exercise to the level that she could before.   She feels occasional swelling in her ankles and in her hands. There has been no orthopnea or PND.  She has had a normal chest x-ray and normal BNP in the last few months. Echocardiogram is revealed normal left ventricular function post MI.  Past Medical History  Diagnosis Date  . Hypertension   . Heart attack ([place]) 03/2014  . History of C-section   . Hx of heart artery stent   . Heart disease   . HLD (hyperlipidemia)     Past Surgical History  Procedure Laterality Date  . Cesarean section    . Left heart catheterization with coronary angiogram N/A 04/06/2014    Procedure: LEFT HEART CATHETERIZATION WITH CORONARY ANGIOGRAM;  Surgeon: Jettie Booze, MD;  Location: [facility] CATH LAB;  Service: Cardiovascular;  Laterality: N/A;  . Cardiac surgery    . Cholecystectomy       Current Outpatient Prescriptions  Medication Sig Dispense Refill  . aspirin 81 MG chewable tablet Chew 1 tablet (81 mg total)  by mouth daily.    . clopidogrel (PLAVIX) 75 MG tablet TAKE 1 TABLET (75 MG TOTAL) BY MOUTH DAILY. 30 tablet 5  . isosorbide mononitrate (IMDUR) 30 MG 24 hr tablet Take 1 tablet (30 mg total) by mouth daily. 90 tablet 3  . lisinopril (PRINIVIL,ZESTRIL) 5 MG tablet Take 1 tablet (5 mg total) by mouth daily. 90 tablet 3  . metoprolol tartrate (LOPRESSOR) 25 MG tablet Take 1 tablet (25 mg total) by mouth 2 (two) times daily. 180 tablet 3  . nitroGLYCERIN (NITROSTAT) 0.4 MG SL tablet Place 1 tablet (0.4 mg total) under the tongue every 5 (five) minutes x 3 doses as needed for chest pain. 25 tablet 2  . Probiotic Product (ALIGN) 4 MG CAPS Take 4 mg by mouth 2 (two) times daily.    . rosuvastatin (CRESTOR) 10 MG tablet Take 1 tablet (10 mg total) by mouth daily. 30 tablet 6   No current facility-administered medications for this visit.    Allergies:   Review of patient's allergies indicates no known allergies.    Social History:  The patient  reports that she has quit smoking. She does not have any smokeless tobacco history on file. She reports that she does not drink alcohol or use illicit drugs.   Family History:  The patient's family history includes Heart attack (age of onset: 82) in her father; Hyperlipidemia in her mother; Hypertension in her mother; Liver cancer in her maternal grandmother; Stroke in her paternal grandfather. There is no history of Kidney disease or Bladder Cancer.    ROS:  Please see the history of present illness.   Otherwise, review of systems are positive for weight gain, dyspnea on exertion.   All other systems are reviewed and negative.    PHYSICAL EXAM: VS:  BP 118/80 mmHg  Pulse 78  Ht 5\' 4"  (1.626 m)  Wt 246 lb (111.585 kg)  BMI 42.21 kg/m2 , BMI Body mass index is 42.21 kg/(m^2). GEN: Well nourished, well developed, in no acute distress HEENT: normal Neck: no JVD, carotid bruits, or masses Cardiac: RRR; no murmurs, rubs, or gallops,no edema  Respiratory:   clear to auscultation bilaterally, normal work of breathing GI: soft, nontender, nondistended, + BS, obese MS: no deformity or atrophy Skin: warm and dry, no rash Neuro:  Strength and sensation are intact Psych: euthymic mood, full affect, anxious    Recent Labs: 04/06/2014: TSH 2.145 05/07/2014: HGB 12.9; Platelet 245 05/12/2014: Pro B Natriuretic peptide (BNP) 61.0 05/26/2014: BUN 22; Creatinine, Ser 0.63; Potassium 4.4; Sodium 136 09/28/2014: ALT 20   Lipid Panel    Component Value Date/Time   CHOL 130 07/27/2014 0943   TRIG 124.0 07/27/2014 0943   HDL 29.10* 07/27/2014 3491  CHOLHDL 4 07/27/2014 0943   VLDL 24.8 07/27/2014 0943   LDLCALC 76 07/27/2014 0943     Other studies Reviewed: Additional studies/ records that were reviewed today with results demonstrating: cath report, echo results as above.   ASSESSMENT AND PLAN:  1. Dyspnea on exertion/CAD: The dyspnea could be an anginal equivalent. We'll long discussion about her anomalous circumflex. It was not very well visualized at the time of heart catheterization. The moderate disease could appear better when rechecked during a more elective cath rather than during a STEMI. We will plan on exercise nuclear stress test to see if there is evidence of ischemia. The dyspnea could be multifactorial. She has gained some weight. She has not been exercising and has likely lost some of the exercise tolerance that she had in the past.  Certainly, if there is ischemia, we will proceed quickly with cardiac cath. If there is no ischemia but her symptoms persist, we would also proceed with cardiac cath. The patient and husband would like this performed before the end of the calendar year for insurance reasons. 2. Obesity: I think this is somewhat cyclical. She feels worse and exerts himself less which contributes to the obesity which then contributes to her dyspnea.  3. Hyperlipidemia: Lipids much better controlled back in June 2016 on statin  therapy.    If she has sx lke her MI, she should proceed to the ER.  They feel it had some bad visits going to the [facility] emergency room. They also saw a dietitian who apparently did not prescribe her diet per their report. They were not satisfied.  Current medicines are reviewed at length with the patient today.  The patient concerns regarding her medicines were addressed.  The following changes have been made:  No change   Labs/ tests ordered today include:   Orders Placed This Encounter  Procedures  . Myocardial Perfusion Imaging    Recommend 150 minutes/week of aerobic exercise Low fat, low carb, high fiber diet recommended  Disposition:   FU for the stress test and possibly cath if required after that   Teresita Madura., MD  11/23/2014 2:15 PM    [facility] [address], [place], [place]  [address] Phone: [phone]; Fax: [phone]

## 2014-11-30 ENCOUNTER — Telehealth (HOSPITAL_COMMUNITY): Payer: Self-pay | Admitting: *Deleted

## 2014-11-30 NOTE — Telephone Encounter (Signed)
Patient given detailed instructions per Myocardial Perfusion Study Information Sheet for the test on 12/02/14 at 1230. Patient notified to arrive 15 minutes early and that it is imperative to arrive on time for appointment to keep from having the test rescheduled.  If you need to cancel or reschedule your appointment, please call the office within 24 hours of your appointment. Failure to do so may result in a cancellation of your appointment, and a $50 no show fee. Patient verbalized understanding.Sheri [name]    

## 2014-12-02 ENCOUNTER — Ambulatory Visit (HOSPITAL_COMMUNITY): Payer: BLUE CROSS/BLUE SHIELD | Attending: Cardiology

## 2014-12-02 DIAGNOSIS — I10 Essential (primary) hypertension: Secondary | ICD-10-CM | POA: Insufficient documentation

## 2014-12-02 DIAGNOSIS — Z87891 Personal history of nicotine dependence: Secondary | ICD-10-CM | POA: Diagnosis not present

## 2014-12-02 DIAGNOSIS — Z8249 Family history of ischemic heart disease and other diseases of the circulatory system: Secondary | ICD-10-CM | POA: Insufficient documentation

## 2014-12-02 DIAGNOSIS — R002 Palpitations: Secondary | ICD-10-CM | POA: Diagnosis not present

## 2014-12-02 DIAGNOSIS — R079 Chest pain, unspecified: Secondary | ICD-10-CM | POA: Diagnosis not present

## 2014-12-02 DIAGNOSIS — R0602 Shortness of breath: Secondary | ICD-10-CM | POA: Diagnosis not present

## 2014-12-02 DIAGNOSIS — R0609 Other forms of dyspnea: Secondary | ICD-10-CM | POA: Insufficient documentation

## 2014-12-02 MED ORDER — TECHNETIUM TC 99M SESTAMIBI GENERIC - CARDIOLITE
31.7000 | Freq: Once | INTRAVENOUS | Status: AC | PRN
  Administered 2014-12-02: 31.7 via INTRAVENOUS

## 2014-12-03 ENCOUNTER — Ambulatory Visit (HOSPITAL_COMMUNITY): Payer: BLUE CROSS/BLUE SHIELD | Attending: Internal Medicine

## 2014-12-03 LAB — MYOCARDIAL PERFUSION IMAGING
CHL CUP NUCLEAR SDS: 3
CHL CUP NUCLEAR SSS: 4
CSEPED: 6 min
CSEPEW: 7.2 METS
CSEPPHR: 148 {beats}/min
Exercise duration (sec): 0 s
LV dias vol: 75 mL
LV sys vol: 27 mL
MPHR: 170 {beats}/min
NUC STRESS TID: 0.79
Percent HR: 86 %
RATE: 0.35
RPE: 18
Rest HR: 84 {beats}/min
SRS: 1

## 2014-12-03 MED ORDER — TECHNETIUM TC 99M SESTAMIBI GENERIC - CARDIOLITE
30.2000 | Freq: Once | INTRAVENOUS | Status: AC | PRN
  Administered 2014-12-03: 30.2 via INTRAVENOUS

## 2014-12-09 ENCOUNTER — Encounter: Payer: Self-pay | Admitting: Emergency Medicine

## 2014-12-09 ENCOUNTER — Emergency Department
Admission: EM | Admit: 2014-12-09 | Discharge: 2014-12-09 | Disposition: A | Discharge status: Home / Self Care | Payer: BLUE CROSS/BLUE SHIELD | Attending: Emergency Medicine | Admitting: Emergency Medicine

## 2014-12-09 ENCOUNTER — Encounter: Payer: Self-pay | Admitting: *Deleted

## 2014-12-09 ENCOUNTER — Emergency Department: Payer: BLUE CROSS/BLUE SHIELD

## 2014-12-09 DIAGNOSIS — I10 Essential (primary) hypertension: Secondary | ICD-10-CM | POA: Diagnosis not present

## 2014-12-09 DIAGNOSIS — R109 Unspecified abdominal pain: Secondary | ICD-10-CM

## 2014-12-09 DIAGNOSIS — N23 Unspecified renal colic: Secondary | ICD-10-CM | POA: Insufficient documentation

## 2014-12-09 DIAGNOSIS — Z7982 Long term (current) use of aspirin: Secondary | ICD-10-CM | POA: Insufficient documentation

## 2014-12-09 DIAGNOSIS — I2111 ST elevation (STEMI) myocardial infarction involving right coronary artery: Secondary | ICD-10-CM

## 2014-12-09 DIAGNOSIS — Z79899 Other long term (current) drug therapy: Secondary | ICD-10-CM | POA: Diagnosis not present

## 2014-12-09 DIAGNOSIS — Z87891 Personal history of nicotine dependence: Secondary | ICD-10-CM | POA: Diagnosis not present

## 2014-12-09 DIAGNOSIS — R1032 Left lower quadrant pain: Secondary | ICD-10-CM | POA: Diagnosis present

## 2014-12-09 DIAGNOSIS — Z9861 Coronary angioplasty status: Secondary | ICD-10-CM

## 2014-12-09 LAB — CBC WITH DIFFERENTIAL/PLATELET
Basophils Absolute: 0 10*3/uL (ref 0–0.1)
Basophils Relative: 0 %
EOS ABS: 0.2 10*3/uL (ref 0–0.7)
EOS PCT: 1 %
HCT: 37.9 % (ref 35.0–47.0)
Hemoglobin: 12.7 g/dL (ref 12.0–16.0)
LYMPHS ABS: 1.8 10*3/uL (ref 1.0–3.6)
Lymphocytes Relative: 12 %
MCH: 28.1 pg (ref 26.0–34.0)
MCHC: 33.5 g/dL (ref 32.0–36.0)
MCV: 83.9 fL (ref 80.0–100.0)
MONO ABS: 0.7 10*3/uL (ref 0.2–0.9)
MONOS PCT: 5 %
Neutro Abs: 12.3 10*3/uL — ABNORMAL HIGH (ref 1.4–6.5)
Neutrophils Relative %: 82 %
PLATELETS: 239 10*3/uL (ref 150–440)
RBC: 4.52 MIL/uL (ref 3.80–5.20)
RDW: 13.9 % (ref 11.5–14.5)
WBC: 15 10*3/uL — ABNORMAL HIGH (ref 3.6–11.0)

## 2014-12-09 LAB — URINALYSIS COMPLETE WITH MICROSCOPIC (ARMC ONLY)
BILIRUBIN URINE: NEGATIVE
Bacteria, UA: NONE SEEN
GLUCOSE, UA: NEGATIVE mg/dL
KETONES UR: NEGATIVE mg/dL
Nitrite: NEGATIVE
PH: 5 (ref 5.0–8.0)
Protein, ur: NEGATIVE mg/dL
Specific Gravity, Urine: 1.014 (ref 1.005–1.030)

## 2014-12-09 LAB — BASIC METABOLIC PANEL
Anion gap: 10 (ref 5–15)
BUN: 18 mg/dL (ref 6–20)
CHLORIDE: 105 mmol/L (ref 101–111)
CO2: 24 mmol/L (ref 22–32)
CREATININE: 1 mg/dL (ref 0.44–1.00)
Calcium: 9.6 mg/dL (ref 8.9–10.3)
GFR calc Af Amer: >60 mL/min (ref >60)
GFR calc non Af Amer: >60 mL/min (ref >60)
Glucose, Bld: 111 mg/dL — ABNORMAL HIGH (ref 65–99)
Potassium: 4.1 mmol/L (ref 3.5–5.1)
SODIUM: 139 mmol/L (ref 135–145)

## 2014-12-09 MED ORDER — HYDROMORPHONE HCL 2 MG PO TABS
2.0000 mg | ORAL_TABLET | Freq: Once | ORAL | Status: AC
  Administered 2014-12-09: 2 mg via ORAL
  Filled 2014-12-09: qty 1

## 2014-12-09 MED ORDER — KETOROLAC TROMETHAMINE 30 MG/ML IJ SOLN
30.0000 mg | Freq: Once | INTRAMUSCULAR | Status: AC
  Administered 2014-12-09: 30 mg via INTRAVENOUS

## 2014-12-09 MED ORDER — ONDANSETRON HCL 4 MG/2ML IJ SOLN
INTRAMUSCULAR | Status: AC
  Administered 2014-12-09: 4 mg via INTRAVENOUS
  Filled 2014-12-09: qty 2

## 2014-12-09 MED ORDER — KETOROLAC TROMETHAMINE 30 MG/ML IJ SOLN
INTRAMUSCULAR | Status: AC
  Administered 2014-12-09: 30 mg via INTRAVENOUS
  Filled 2014-12-09: qty 1

## 2014-12-09 MED ORDER — ONDANSETRON HCL 4 MG PO TABS: 4.0000 mg | ORAL_TABLET | Freq: Three times a day (TID) | ORAL | Status: DC | PRN

## 2014-12-09 MED ORDER — HYDROMORPHONE HCL 2 MG PO TABS: 2.0000 mg | ORAL_TABLET | Freq: Two times a day (BID) | ORAL | Status: DC | PRN

## 2014-12-09 MED ORDER — ONDANSETRON HCL 4 MG/2ML IJ SOLN
4.0000 mg | Freq: Once | INTRAMUSCULAR | Status: AC
  Administered 2014-12-09: 4 mg via INTRAVENOUS

## 2014-12-09 MED ORDER — HYDROMORPHONE HCL 1 MG/ML IJ SOLN
INTRAMUSCULAR | Status: AC
  Administered 2014-12-09: 0.5 mg via INTRAVENOUS
  Filled 2014-12-09: qty 1

## 2014-12-09 MED ORDER — HYDROMORPHONE HCL 1 MG/ML IJ SOLN
0.5000 mg | Freq: Once | INTRAMUSCULAR | Status: AC
  Administered 2014-12-09: 0.5 mg via INTRAVENOUS

## 2014-12-09 MED ORDER — TAMSULOSIN HCL 0.4 MG PO CAPS
0.4000 mg | ORAL_CAPSULE | Freq: Every day | ORAL | Status: AC
Start: 2014-12-09 — End: 2014-12-16

## 2014-12-09 NOTE — ED Notes (Signed)
Pt back from CT
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
## 2014-12-09 NOTE — ED Provider Notes (Signed)
Time Seen: Approximately ----------------------------------------- 7:49 AM on 12/09/2014 -----------------------------------------   I have reviewed the triage notes  Chief Complaint: Flank Pain   History of Present Illness: Sheri [name] is a 49 y.o. female who presents with rather acute onset of left-sided lower abdominal pain with mild radiation left flank area. Patient states she had some similar pain a couple months ago but resolved on its own and she was advised to have a CT but since the pain went away she did not at that time. She was seen by urologist. Patient states that she had some blood in her urine and she was changed from her blood thinner Lacretia Leigh) to Plavix. He states pain started last night but is very intense today with some associated nausea no persistent vomiting. She denies any right-sided abdomen or flank pain  Past Medical History  Diagnosis Date  . Hypertension   . Heart attack ([place]) 03/2014  . History of C-section   . Hx of heart artery stent   . Heart disease   . HLD (hyperlipidemia)     Patient Active Problem List   Diagnosis Date Noted  . Gross hematuria 10/04/2014  . Urinary frequency 10/04/2014  . Obese 07/06/2014  . Elevated CK 05/12/2014  . Coronary arteriosclerosis 04/21/2014  . Hyperlipidemia 04/21/2014  . STEMI (ST elevation myocardial infarction) ([place]) 04/06/2014  . Acute MI, inferolateral wall, initial episode of care ([place])   . Ventricular tachycardia ([place])   . Chest discomfort     Past Surgical History  Procedure Laterality Date  . Cesarean section    . Left heart catheterization with coronary angiogram N/A 04/06/2014    Procedure: LEFT HEART CATHETERIZATION WITH CORONARY ANGIOGRAM;  Surgeon: Jettie Booze, MD;  Location: [facility] CATH LAB;  Service: Cardiovascular;  Laterality: N/A;  . Cardiac surgery    . Cholecystectomy      Past Surgical History  Procedure Laterality Date  . Cesarean section    . Left heart catheterization  with coronary angiogram N/A 04/06/2014    Procedure: LEFT HEART CATHETERIZATION WITH CORONARY ANGIOGRAM;  Surgeon: Jettie Booze, MD;  Location: [facility] CATH LAB;  Service: Cardiovascular;  Laterality: N/A;  . Cardiac surgery    . Cholecystectomy      Current Outpatient Rx  Name  Route  Sig  Dispense  Refill  . aspirin 81 MG chewable tablet   Oral   Chew 1 tablet (81 mg total) by mouth daily.         . clopidogrel (PLAVIX) 75 MG tablet      TAKE 1 TABLET (75 MG TOTAL) BY MOUTH DAILY.   30 tablet   5   . isosorbide mononitrate (IMDUR) 30 MG 24 hr tablet   Oral   Take 1 tablet (30 mg total) by mouth daily.   90 tablet   3   . lisinopril (PRINIVIL,ZESTRIL) 5 MG tablet   Oral   Take 1 tablet (5 mg total) by mouth daily.   90 tablet   3   . metoprolol tartrate (LOPRESSOR) 25 MG tablet   Oral   Take 1 tablet (25 mg total) by mouth 2 (two) times daily.   180 tablet   3   . nitroGLYCERIN (NITROSTAT) 0.4 MG SL tablet   Sublingual   Place 1 tablet (0.4 mg total) under the tongue every 5 (five) minutes x 3 doses as needed for chest pain.   25 tablet   2   . Probiotic Product (ALIGN) 4  MG CAPS   Oral   Take 4 mg by mouth 2 (two) times daily.         . rosuvastatin (CRESTOR) 10 MG tablet   Oral   Take 1 tablet (10 mg total) by mouth daily.   30 tablet   6     Allergies:  Review of patient's allergies indicates no known allergies.  Family History: Family History  Problem Relation Age of Onset  . Heart attack Father 33    died of MI at age 67  . Hypertension Mother   . Hyperlipidemia Mother   . Kidney disease Neg Hx   . Bladder Cancer Neg Hx   . Stroke Paternal Grandfather   . Liver cancer Maternal Grandmother     Social History: Social History  Substance Use Topics  . Smoking status: Former Research scientist (life sciences)  . Smokeless tobacco: None     Comment: quit 20 years  . Alcohol Use: No     Review of Systems:   10 point review of systems was performed and was  otherwise negative:  Constitutional: No fever Eyes: No visual disturbances ENT: No sore throat, ear pain Cardiac: No chest pain Respiratory: No shortness of breath, wheezing, or stridor Abdomen: Pain is in the left lower quadrant of the abdomen no vomiting, No diarrhea Endocrine: No weight loss, No night sweats Extremities: No peripheral edema, cyanosis Skin: No rashes, easy bruising Neurologic: No focal weakness, trouble with speech or swollowing Urologic: No dysuria, Hematuria, or urinary frequency    Physical Exam:  ED Triage Vitals  Enc Vitals Group     BP 12/09/14 0711 151/90 mmHg     Pulse Rate 12/09/14 0711 81     Resp 12/09/14 0711 20     Temp 12/09/14 0711 97.8 F (36.6 C)     Temp Source 12/09/14 0711 Oral     SpO2 12/09/14 0711 97 %     Weight 12/09/14 0711 246 lb (111.585 kg)     Height 12/09/14 0711 5\' 4"  (1.626 m)     Head Cir --      Peak Flow --      Pain Score 12/09/14 0714 8     Pain Loc --      Pain Edu? --      Excl. in [place]? --     General: Awake , Alert , and Oriented times 3; GCS 15. Patient has a hard time getting comfortable. Head: Normal cephalic , atraumatic Eyes: Pupils equal , round, reactive to light Nose/Throat: No nasal drainage, patent upper airway without erythema or exudate.  Neck: Supple, Full range of motion, No anterior adenopathy or palpable thyroid masses Lungs: Clear to ascultation without wheezes , rhonchi, or rales Heart: Regular rate, regular rhythm without murmurs , gallops , or rubs Abdomen: Soft, non tender without rebound, guarding , or rigidity; bowel sounds positive and symmetric in all 4 quadrants. No organomegaly .        Extremities: 2 plus symmetric pulses. No edema, clubbing or cyanosis Neurologic: normal ambulation, Motor symmetric without deficits, sensory intact Skin: warm, dry, no rashes   Labs:   All laboratory work was reviewed including any pertinent negatives or positives listed below:  Labs Reviewed   BASIC METABOLIC PANEL  CBC WITH DIFFERENTIAL/PLATELET  URINALYSIS COMPLETEWITH MICROSCOPIC ([place])   patient's laboratory work was reviewed and shows an elevated white blood cell count but no signs of renal insufficiency etc.     Radiology:  EXAM: CT ABDOMEN AND  PELVIS WITHOUT CONTRAST  TECHNIQUE: Multidetector CT imaging of the abdomen and pelvis was performed following the standard protocol without IV contrast.  COMPARISON: None.  FINDINGS: Lower chest: Possible 3 mm right lower lobe pulmonary nodule (image 1, series 4). Likely similar on the 05/07/2014 chest CT. Normal heart size without pericardial or pleural effusion. Right Coronary artery atherosclerosis versus a coronary stent.  Hepatobiliary: Normal liver. Cholecystectomy, without biliary ductal dilatation.  Pancreas: Normal, without mass or ductal dilatation.  Spleen: Normal in size, without focal abnormality.  Adrenals/Urinary Tract: Normal adrenal glands. Punctate left renal collecting system calculi. Moderate left-sided hydroureteronephrosis to the level of a 4 mm left ureterovesicular junction stone. Air within the urinary bladder.  Stomach/Bowel: Normal stomach, without wall thickening. Normal terminal ileum and appendix. Normal small bowel.  Vascular/Lymphatic: Aortic and branch vessel atherosclerosis. No abdominopelvic adenopathy.  Reproductive: Normal uterus and adnexa.  Other: No significant free fluid.  Musculoskeletal: Degenerative disc disease at the lumbosacral junction.  IMPRESSION: 1. Moderate left-sided hydroureteronephrosis secondary to a 4 mm ureterovesicular junction calculus. 2. Left nephrolithiasis. 3. Air within the urinary bladder, correlate with instrumentation. 4. Age advanced coronary artery atherosclerosis. Recommend assessment of coronary risk factors and consideration of medical therapy. 5. Probable right lung base nodule. If the patient is at high risk for  bronchogenic carcinoma, follow-up chest CT at 1 year is recommended. If the patient is at low risk, no follow-up is needed. This recommendation follows the consensus statement: "Guidelines for Management of Small Pulmonary Nodules Detected on CT Scans: A Statement from the [employer]" as published in Radiology 2005; 237:395-400. Available online at: https://www.arnold.com/.   Electronically Signed By: Abigail Miyamoto M.D. On: 12/09/2014 08:07          I personally reviewed the radiologic studies   ED Course: Patient's stay was uneventful and she was given IV Toradol, IV Dilaudid, and IV Zofran with symptomatic improvement. Differential diagnosis includes but is not exclusive to ovarian cyst, ovarian torsion, acute appendicitis, urinary tract infection, endometriosis, bowel obstruction, colitis, renal colic, gastroenteritis, etc. Given the patient's current presentation and objective findings appears that she has renal colic. Her stay and does not appear to be causing any significant complications such as renal insufficiency and there is no signs of a urinary tract infection I felt we could treat the patient on an outpatient basis. Patient was given a urine strainer advised to strain urine and save the stone if one becomes available. She does have a local urologist and was advised to contact them concerning today's emergency department visit    Assessment:  Acute left-sided renal colic   Final Clinical Impression:   Final diagnoses:  Left flank pain     Plan:  Patient was advised to return immediately if condition worsens. Patient was advised to follow up with her primary care physician or other specialized physicians involved and in their current assessment. Patient was advised to return here to emergency department especially if she develops a fever, uncontrolled vomiting, uncontrolled abdominal pain, or any other new concerns. She  was advised take over-the-counter ibuprofen around-the-clock and was prescribed Dilaudid, Flomax, and Zofran.           Daymon Larsen, MD 12/09/14 [phone]

## 2014-12-09 NOTE — ED Notes (Signed)
Pt with left flank pain and pressure started last night.

## 2015-01-05 ENCOUNTER — Telehealth: Payer: Self-pay | Admitting: *Deleted

## 2015-01-05 NOTE — Telephone Encounter (Signed)
Called to check on status to return to program. LMOM 
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
## 2015-01-06 NOTE — Addendum Note (Signed)
Addended by: Lynford Humphrey on: 01/06/2015 07:47 AM   Modules accepted: Orders

## 2015-01-09 ENCOUNTER — Other Ambulatory Visit: Payer: Self-pay | Admitting: Interventional Cardiology

## 2015-01-26 ENCOUNTER — Encounter: Payer: Self-pay | Admitting: *Deleted

## 2015-01-26 NOTE — Progress Notes (Signed)
Contacted patient via telephone to inquire regarding her plans to return to Cardiac Rehab.  Left message for patient to return my phone call to let me know.

## 2015-01-28 DIAGNOSIS — Z9861 Coronary angioplasty status: Secondary | ICD-10-CM

## 2015-01-28 DIAGNOSIS — I2111 ST elevation (STEMI) myocardial infarction involving right coronary artery: Secondary | ICD-10-CM
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
## 2015-01-28 NOTE — Addendum Note (Signed)
Addended by: Gerlene Burdock on: 01/28/2015 05:31 PM   Modules accepted: Orders
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
## 2015-02-03 NOTE — Addendum Note (Signed)
Addended by: Lynford Humphrey on: 02/03/2015 11:05 AM   Modules accepted: Orders

## 2015-05-04 ENCOUNTER — Telehealth: Payer: Self-pay | Admitting: Interventional Cardiology

## 2015-05-04 NOTE — Telephone Encounter (Signed)
Informed dental office that will I forward message to Dr. Irish Lack to address question about Plavix. They understand we will call them back once advised on by physician.

## 2015-05-04 NOTE — Telephone Encounter (Signed)
New message:    Pt is going to have dental extraction and an implant. Wants to know what to do about her Plavix? We need this asap,just waiting to hear from you.

## 2015-05-05 NOTE — Telephone Encounter (Signed)
OK to stay on Plavix if dentist/oral surgeon feels that extraction can be done safely on this medicine.  Since her stent is more than a year old, She could hold the Plavix for 5 days prior to the extraction if the dentist feels that there is a high risk of bleeding with the extraction if it is done on Plavix.

## 2015-05-05 NOTE — Telephone Encounter (Signed)
**Note De-Identified Audreyanna Butkiewicz Obfuscation** There is no fax number listed in note to fax back to Dr Toy Cookey. I called his office multiple times but only got recording stating that the office was closed for lunch. I found a fax number online for Dr Delos Haring office at [phone] and faxed this message to them. I did receive confirmation that the fax went through successfully.

## 2015-05-11 ENCOUNTER — Telehealth: Payer: Self-pay | Admitting: Interventional Cardiology

## 2015-05-11 ENCOUNTER — Other Ambulatory Visit: Payer: Self-pay | Admitting: Interventional Cardiology

## 2015-05-11 NOTE — Telephone Encounter (Signed)
See phone note from 4/4.

## 2015-05-11 NOTE — Telephone Encounter (Signed)
1. What dental office are you calling from? Dr. Salli Real   2. What is your office phone and fax number? Off- [phone] Fax- [phone]   3. What type of procedure is the patient having performed? Tooth Extraction    4. What date is procedure scheduled? N/a   5. What is your question (ex. Antibiotics prior to procedure, holding medication-we need to know how long dentist wants pt to hold med)? The doctor would like for her to hold her plavix and he would like to know hold long she should hold this medication.   6.

## 2015-06-19 ENCOUNTER — Other Ambulatory Visit: Payer: Self-pay | Admitting: Interventional Cardiology

## 2015-06-21 ENCOUNTER — Ambulatory Visit (INDEPENDENT_AMBULATORY_CARE_PROVIDER_SITE_OTHER): Payer: BLUE CROSS/BLUE SHIELD | Admitting: Family Medicine

## 2015-06-21 VITALS — BP 138/82 | HR 67 | Temp 98.6°F | Resp 16 | Ht 64.0 in | Wt 259.0 lb

## 2015-06-21 DIAGNOSIS — I2510 Atherosclerotic heart disease of native coronary artery without angina pectoris: Secondary | ICD-10-CM | POA: Diagnosis not present

## 2015-06-21 DIAGNOSIS — R635 Abnormal weight gain: Secondary | ICD-10-CM

## 2015-06-21 DIAGNOSIS — F32A Depression, unspecified: Secondary | ICD-10-CM

## 2015-06-21 DIAGNOSIS — E785 Hyperlipidemia, unspecified: Secondary | ICD-10-CM

## 2015-06-21 DIAGNOSIS — F329 Major depressive disorder, single episode, unspecified: Secondary | ICD-10-CM

## 2015-06-21 DIAGNOSIS — M722 Plantar fascial fibromatosis: Secondary | ICD-10-CM | POA: Diagnosis not present

## 2015-06-21 DIAGNOSIS — F411 Generalized anxiety disorder: Secondary | ICD-10-CM | POA: Insufficient documentation

## 2015-06-21 DIAGNOSIS — F418 Other specified anxiety disorders: Secondary | ICD-10-CM | POA: Diagnosis not present

## 2015-06-21 DIAGNOSIS — F419 Anxiety disorder, unspecified: Principal | ICD-10-CM

## 2015-06-21 MED ORDER — ESCITALOPRAM OXALATE 10 MG PO TABS: 10.0000 mg | ORAL_TABLET | Freq: Every day | ORAL | Status: DC

## 2015-06-21 MED ORDER — PREDNISONE 10 MG PO TABS: ORAL_TABLET | ORAL | Status: DC

## 2015-06-21 NOTE — Assessment & Plan Note (Signed)
Managed by cardiology. Discussed risks of NSAID use and cardiac blockages.

## 2015-06-21 NOTE — Assessment & Plan Note (Signed)
Ice and trial of prednisone to help with pain/swelling. Avoid NSAIDs if possible due to CAD. Pt will use low dose ibuprofen OTC after prednisone to help with pain. Consider PT or podiatry if symptoms persist

## 2015-06-21 NOTE — Patient Instructions (Addendum)
Foot pain: Ice the foot three times daily. Stretch on tennis ball.  Depression and Anxiety: Start lexapro to help with symptoms. Take a 1/2 tablet at bedtime for first 4 days. Then resume full dose. 30 minutes of exercise daily can help with your symptoms. Taking a vitamin D 2000IU daily can help as well.   Plantar Fasciitis Plantar fasciitis is a painful foot condition that affects the heel. It occurs when the band of tissue that connects the toes to the heel bone (plantar fascia) becomes irritated. This can happen after exercising too much or doing other repetitive activities (overuse injury). The pain from plantar fasciitis can range from mild irritation to severe pain that makes it difficult for you to walk or move. The pain is usually worse in the morning or after you have been sitting or lying down for a while. CAUSES This condition may be caused by:  Standing for long periods of time.  Wearing shoes that do not fit.  Doing high-impact activities, including running, aerobics, and ballet.  Being overweight.  Having an abnormal way of walking (gait).  Having tight calf muscles.  Having high arches in your feet.  Starting a new athletic activity. SYMPTOMS The main symptom of this condition is heel pain. Other symptoms include:  Pain that gets worse after activity or exercise.  Pain that is worse in the morning or after resting.  Pain that goes away after you walk for a few minutes. DIAGNOSIS This condition may be diagnosed based on your signs and symptoms. Your health care provider will also do a physical exam to check for:  A tender area on the bottom of your foot.  A high arch in your foot.  Pain when you move your foot.  Difficulty moving your foot. You may also need to have imaging studies to confirm the diagnosis. These can include:  X-rays.  Ultrasound.  MRI. TREATMENT  Treatment for plantar fasciitis depends on the severity of the condition. Your treatment  may include:  Rest, ice, and over-the-counter pain medicines to manage your pain.  Exercises to stretch your calves and your plantar fascia.  A splint that holds your foot in a stretched, upward position while you sleep (night splint).  Physical therapy to relieve symptoms and prevent problems in the future.  Cortisone injections to relieve severe pain.  Extracorporeal shock wave therapy (ESWT) to stimulate damaged plantar fascia with electrical impulses. It is often used as a last resort before surgery.  Surgery, if other treatments have not worked after 12 months. HOME CARE INSTRUCTIONS  Take medicines only as directed by your health care provider.  Avoid activities that cause pain.  Roll the bottom of your foot over a bag of ice or a bottle of cold water. Do this for 20 minutes, 3-4 times a day.  Perform simple stretches as directed by your health care provider.  Try wearing athletic shoes with air-sole or gel-sole cushions or soft shoe inserts.  Wear a night splint while sleeping, if directed by your health care provider.  Keep all follow-up appointments with your health care provider. PREVENTION   Do not perform exercises or activities that cause heel pain.  Consider finding low-impact activities if you continue to have problems.  Lose weight if you need to. The best way to prevent plantar fasciitis is to avoid the activities that aggravate your plantar fascia. SEEK MEDICAL CARE IF:  Your symptoms do not go away after treatment with home care measures.  Your pain  gets worse.  Your pain affects your ability to move or do your daily activities.   This information is not intended to replace advice given to you by your health care provider. Make sure you discuss any questions you have with your health care provider.   Document Released: 10/11/2000 Document Revised: 10/07/2014 Document Reviewed: 11/26/2013 Elsevier Interactive Patient Education [employer].

## 2015-06-21 NOTE — Assessment & Plan Note (Signed)
Status post MI. Pt amenable to starting medication today. Will try lexapro 10mg  daily. Check TSH, Vitamin D, and B12. Reassured patient these are normal feelings after an MI. Encouraged 30 minutes of exercise daily.  Reviewed risks, benefits, and side effects of SSRI. Recheck 1 mos.

## 2015-06-21 NOTE — Assessment & Plan Note (Signed)
Check baseline lipid panel.  

## 2015-06-21 NOTE — Telephone Encounter (Signed)
Rx(s) sent to pharmacy electronically.  

## 2015-06-21 NOTE — Progress Notes (Signed)
Subjective:    Patient ID: Sheri [name], female    DOB: 1965-11-22, 50 y.o.   MRN: [id number]  HPI: Sheri [name] is a 50 y.o. female presenting on 06/21/2015 for Foot Pain and Anxiety   HPI Pt presents to discuss foot pain and anxiety. She has not been seen for almost 3 years. Foot pain: Heels of both feet. Plantar surface is sore x 3 weeks. Hurts to step down in the Am worst.  Pain is 5-6/10 Hurts throughout the day. Taking tylenol- mild relief.  Anxiety and depression: Had an MI last year and since felt very anxious and depressed. Afraid it will happen again- worries all the time. Very worried about her health. Cannot relax. No SI/HI. Has never tried medication. Has not gone to counseling.  Lung nodule- smoked 20 years ago. Was a hair dresser. Recommended 1 year follow-up.   Past Medical History  Diagnosis Date  . Hypertension   . Heart attack ([place]) 03/2014  . History of C-section   . Hx of heart artery stent   . Heart disease   . HLD (hyperlipidemia)     Current Outpatient Prescriptions on File Prior to Visit  Medication Sig  . aspirin 81 MG chewable tablet Chew 1 tablet (81 mg total) by mouth daily.  . clopidogrel (PLAVIX) 75 MG tablet TAKE 1 TABLET (75 MG TOTAL) BY MOUTH DAILY.  Marland Kitchen lisinopril (PRINIVIL,ZESTRIL) 5 MG tablet TAKE 1 TABLET EVERY DAY  . metoprolol tartrate (LOPRESSOR) 25 MG tablet Take 1 tablet (25 mg total) by mouth 2 (two) times daily.  . nitroGLYCERIN (NITROSTAT) 0.4 MG SL tablet Place 1 tablet (0.4 mg total) under the tongue every 5 (five) minutes x 3 doses as needed for chest pain.  . Probiotic Product (ALIGN) 4 MG CAPS Take 4 mg by mouth 2 (two) times daily.  . rosuvastatin (CRESTOR) 10 MG tablet Take 1 tablet (10 mg total) by mouth daily.  . isosorbide mononitrate (IMDUR) 30 MG 24 hr tablet Take 1 tablet (30 mg total) by mouth daily. (Patient not taking: Reported on 06/21/2015)   No current facility-administered medications on file prior to visit.     Review of Systems  Constitutional: Negative for fever and chills.  HENT: Negative.   Respiratory: Negative for cough, chest tightness and wheezing.   Cardiovascular: Negative for chest pain and leg swelling.  Gastrointestinal: Negative for nausea, vomiting, abdominal pain, diarrhea and constipation.  Endocrine: Negative.  Negative for cold intolerance, heat intolerance, polydipsia, polyphagia and polyuria.  Genitourinary: Negative for dysuria and difficulty urinating.  Musculoskeletal: Positive for myalgias.  Neurological: Negative for dizziness, light-headedness and numbness.  Psychiatric/Behavioral: Positive for dysphoric mood and decreased concentration. Negative for suicidal ideas. The patient is nervous/anxious.    Per HPI unless specifically indicated above     Depression screen [facility] 2/9 06/21/2015 06/21/2015 07/20/2014  Decreased Interest 3 3 0  Down, Depressed, Hopeless 3 3 1   PHQ - 2 Score 6 6 1   Altered sleeping 3 3 1   Tired, decreased energy 3 3 3   Change in appetite - 2 2  Feeling bad or failure about yourself  3 3 2   Trouble concentrating 3 3 3   Moving slowly or fidgety/restless 3 0 0  Suicidal thoughts 0 0 0  PHQ-9 Score 21 20 12   Difficult doing work/chores Very difficult Very difficult Somewhat difficult   GAD 7 : Generalized Anxiety Score 06/21/2015  Nervous, Anxious, on Edge 2  Control/stop worrying 3  Worry too much -  different things 3  Trouble relaxing 3  Restless 2  Easily annoyed or irritable 3  Afraid - awful might happen 3  Total GAD 7 Score 19  Anxiety Difficulty Very difficult    Objective:    BP 138/82 mmHg  Pulse 67  Temp(Src) 98.6 F (37 C) (Oral)  Resp 16  Ht 5\' 4"  (1.626 m)  Wt 259 lb (117.482 kg)  BMI 44.44 kg/m2  Wt Readings from Last 3 Encounters:  06/21/15 259 lb (117.482 kg)  12/09/14 246 lb (111.585 kg)  12/02/14 246 lb (111.585 kg)    Physical Exam  Constitutional: She is oriented to person, place, and time. She appears  well-developed and well-nourished.  HENT:  Head: Normocephalic and atraumatic.  Neck: Neck supple.  Cardiovascular: Normal rate, regular rhythm and normal heart sounds.  Exam reveals no gallop and no friction rub.   No murmur heard. Pulmonary/Chest: Effort normal and breath sounds normal. She has no wheezes. She exhibits no tenderness.  Abdominal: Soft. Normal appearance and bowel sounds are normal. She exhibits no distension and no mass. There is no tenderness. There is no rebound and no guarding.  Musculoskeletal: Normal range of motion. She exhibits no edema.       Right foot: There is tenderness. There is normal range of motion, no swelling, normal capillary refill and no crepitus.       Left foot: There is tenderness. There is normal range of motion, no bony tenderness and no deformity.       Feet:  Lymphadenopathy:    She has no cervical adenopathy.  Neurological: She is alert and oriented to person, place, and time.  Skin: Skin is warm and dry.  Psychiatric: Her speech is normal and behavior is normal. Judgment and thought content normal. Her mood appears anxious. Cognition and memory are normal. She expresses no suicidal ideation. She expresses no suicidal plans.   Results for orders placed or performed during the hospital encounter of 0000000  Basic metabolic panel  Result Value Ref Range   Sodium 139 135 - 145 mmol/L   Potassium 4.1 3.5 - 5.1 mmol/L   Chloride 105 101 - 111 mmol/L   CO2 24 22 - 32 mmol/L   Glucose, Bld 111 (H) 65 - 99 mg/dL   BUN 18 6 - 20 mg/dL   Creatinine, Ser 1.00 0.44 - 1.00 mg/dL   Calcium 9.6 8.9 - 10.3 mg/dL   GFR calc non Af Amer >60 >60 mL/min   GFR calc Af Amer >60 >60 mL/min   Anion gap 10 5 - 15  CBC with Differential/Platelet  Result Value Ref Range   WBC 15.0 (H) 3.6 - 11.0 K/uL   RBC 4.52 3.80 - 5.20 MIL/uL   Hemoglobin 12.7 12.0 - 16.0 g/dL   HCT 37.9 35.0 - 47.0 %   MCV 83.9 80.0 - 100.0 fL   MCH 28.1 26.0 - 34.0 pg   MCHC 33.5  32.0 - 36.0 g/dL   RDW 13.9 11.5 - 14.5 %   Platelets 239 150 - 440 K/uL   Neutrophils Relative % 82 %   Neutro Abs 12.3 (H) 1.4 - 6.5 K/uL   Lymphocytes Relative 12 %   Lymphs Abs 1.8 1.0 - 3.6 K/uL   Monocytes Relative 5 %   Monocytes Absolute 0.7 0.2 - 0.9 K/uL   Eosinophils Relative 1 %   Eosinophils Absolute 0.2 0 - 0.7 K/uL   Basophils Relative 0 %   Basophils Absolute 0.0 0 -  0.1 K/uL  Urinalysis complete, with microscopic ([facility] only)  Result Value Ref Range   Color, Urine STRAW (A) YELLOW   APPearance CLEAR (A) CLEAR   Glucose, UA NEGATIVE NEGATIVE mg/dL   Bilirubin Urine NEGATIVE NEGATIVE   Ketones, ur NEGATIVE NEGATIVE mg/dL   Specific Gravity, Urine 1.014 1.005 - 1.030   Hgb urine dipstick 2+ (A) NEGATIVE   pH 5.0 5.0 - 8.0   Protein, ur NEGATIVE NEGATIVE mg/dL   Nitrite NEGATIVE NEGATIVE   Leukocytes, UA TRACE (A) NEGATIVE   RBC / HPF 6-30 0 - 5 RBC/hpf   WBC, UA 0-5 0 - 5 WBC/hpf   Bacteria, UA NONE SEEN NONE SEEN   Squamous Epithelial / LPF 0-5 (A) NONE SEEN   Mucous PRESENT       Assessment & Plan:   Problem List Items Addressed This Visit      Cardiovascular and Mediastinum   Coronary arteriosclerosis    Managed by cardiology. Discussed risks of NSAID use and cardiac blockages.       Relevant Orders   Comprehensive metabolic panel     Musculoskeletal and Integument   Plantar fasciitis, bilateral    Ice and trial of prednisone to help with pain/swelling. Avoid NSAIDs if possible due to CAD. Pt will use low dose ibuprofen OTC after prednisone to help with pain. Consider PT or podiatry if symptoms persist      Relevant Medications   predniSONE (DELTASONE) 10 MG tablet     Other   Hyperlipidemia    Check baseline lipid panel.       Relevant Orders   Lipid Profile   Anxiety and depression - Primary    Status post MI. Pt amenable to starting medication today. Will try lexapro 10mg  daily. Check TSH, Vitamin D, and B12. Reassured patient these are  normal feelings after an MI. Encouraged 30 minutes of exercise daily.  Reviewed risks, benefits, and side effects of SSRI. Recheck 1 mos.       Relevant Medications   escitalopram (LEXAPRO) 10 MG tablet   Other Relevant Orders   Vitamin B12   VITAMIN D 25 Hydroxy (Vit-D Deficiency, Fractures)    Other Visit Diagnoses    Abnormal weight gain        40lbs after her MI. Deconditioning and lack of exercise vs. TSH dysfunction.     Relevant Orders    TSH       Meds ordered this encounter  Medications  . predniSONE (DELTASONE) 10 MG tablet    Sig: Take 6 pills today, 5 pills day 2, 4 pills day 3, 3 pills day 4, 2 pills day 5, 1 pill day 6.    Dispense:  21 tablet    Refill:  0    Order Specific Question:  Supervising Provider    Answer:  Arlis Porta [phone]  . escitalopram (LEXAPRO) 10 MG tablet    Sig: Take 1 tablet (10 mg total) by mouth daily.    Dispense:  30 tablet    Refill:  11    Order Specific Question:  Supervising Provider    Answer:  Arlis Porta [phone]      Follow up plan: Return in about 4 weeks (around 07/19/2015) for depression. Marland Kitchen

## 2015-07-17 ENCOUNTER — Other Ambulatory Visit: Payer: Self-pay | Admitting: Interventional Cardiology

## 2015-10-05 DIAGNOSIS — N390 Urinary tract infection, site not specified: Secondary | ICD-10-CM | POA: Diagnosis not present

## 2015-10-05 DIAGNOSIS — R1031 Right lower quadrant pain: Secondary | ICD-10-CM | POA: Diagnosis not present

## 2015-10-05 DIAGNOSIS — N200 Calculus of kidney: Secondary | ICD-10-CM | POA: Diagnosis not present

## 2015-10-16 DIAGNOSIS — N139 Obstructive and reflux uropathy, unspecified: Secondary | ICD-10-CM | POA: Diagnosis not present

## 2015-10-16 DIAGNOSIS — Z79899 Other long term (current) drug therapy: Secondary | ICD-10-CM | POA: Diagnosis not present

## 2015-10-16 DIAGNOSIS — R1031 Right lower quadrant pain: Secondary | ICD-10-CM | POA: Diagnosis not present

## 2015-10-16 DIAGNOSIS — R109 Unspecified abdominal pain: Secondary | ICD-10-CM | POA: Diagnosis not present

## 2015-10-16 DIAGNOSIS — N399 Disorder of urinary system, unspecified: Secondary | ICD-10-CM | POA: Diagnosis not present

## 2015-10-16 DIAGNOSIS — N201 Calculus of ureter: Secondary | ICD-10-CM | POA: Diagnosis not present

## 2015-10-16 DIAGNOSIS — N1330 Unspecified hydronephrosis: Secondary | ICD-10-CM | POA: Diagnosis not present

## 2015-10-18 ENCOUNTER — Ambulatory Visit (INDEPENDENT_AMBULATORY_CARE_PROVIDER_SITE_OTHER): Payer: BLUE CROSS/BLUE SHIELD | Admitting: Urology

## 2015-10-18 ENCOUNTER — Encounter: Payer: Self-pay | Admitting: Urology

## 2015-10-18 VITALS — BP 138/86 | HR 69 | Temp 97.9°F | Ht 65.0 in | Wt 247.1 lb

## 2015-10-18 DIAGNOSIS — N132 Hydronephrosis with renal and ureteral calculous obstruction: Secondary | ICD-10-CM | POA: Diagnosis not present

## 2015-10-18 DIAGNOSIS — N201 Calculus of ureter: Secondary | ICD-10-CM | POA: Diagnosis not present

## 2015-10-18 DIAGNOSIS — R3129 Other microscopic hematuria: Secondary | ICD-10-CM | POA: Diagnosis not present

## 2015-10-18 DIAGNOSIS — Z87442 Personal history of urinary calculi: Secondary | ICD-10-CM | POA: Diagnosis not present

## 2015-10-18 DIAGNOSIS — N200 Calculus of kidney: Secondary | ICD-10-CM | POA: Diagnosis not present

## 2015-10-18 LAB — URINALYSIS, COMPLETE
BILIRUBIN UA: NEGATIVE
GLUCOSE, UA: NEGATIVE
KETONES UA: NEGATIVE
Nitrite, UA: NEGATIVE
PROTEIN UA: NEGATIVE
SPEC GRAV UA: 1.015 (ref 1.005–1.030)
Urobilinogen, Ur: 0.2 mg/dL (ref 0.2–1.0)
pH, UA: 5.5 (ref 5.0–7.5)

## 2015-10-18 LAB — MICROSCOPIC EXAMINATION: BACTERIA UA: NONE SEEN

## 2015-10-18 MED ORDER — HYDROCODONE-ACETAMINOPHEN 5-325 MG PO TABS: 1.0000 | ORAL_TABLET | Freq: Four times a day (QID) | ORAL | 0 refills | Status: DC | PRN

## 2015-10-18 MED ORDER — TAMSULOSIN HCL 0.4 MG PO CAPS: 0.4000 mg | ORAL_CAPSULE | Freq: Every day | ORAL | 0 refills | Status: DC

## 2015-10-18 NOTE — Progress Notes (Signed)
10/18/2015 9:52 PM   Analiese Krupka Vankuren 1965-11-12 [id number]  Referring provider: Luciana Axe, NP [place], [place] [address]  Chief Complaint  Patient presents with  . Nephrolithiasis    stone while in [place]    HPI: Patient is a 50 year old Caucasian female who presents/is referred by Juanda Crumble [facility] for nephrolithiasis.  Patient states that over Labor Day weekend she was "feeling off."  Then Monday. She started to have intense right upper quadrant pain and started to vomit violently.  She then went to the emergency room in [place], [place].  In the ED, she received IV fluids, pain medication, nausea medication and a CT scan was performed. The CT scan noted a 12 mm calcification extending into the bladder at the right UVJ and a 2 mm calcification in the distal right ureter at the level of the acetabulum that was approximately 2 mm in size. Patient brought and a disc for our review and I have personally reviewed the films.  She was offered definitive treatment for the stone at that time, but she deferred to have a procedure done locally.  Since the Labor Day weekend, she has had intermittent bouts of intense right sided pain.  She states it can still reach levels of 7-8 out of 10. She does have nausea associated with the pain.  She has not noted anything that makes the pain worse. Her pain medication seems to make the pain livable at this time.  She has not passed any fragments.    Today, she states she's having a good day. She has not had any right-sided flank pain or nausea.  She is denied fevers, chills or recent vomiting.  Her UA today is significant for 3-10 RBCs per prior field.  She does have a prior history of stones.    She was seen last year in our office for gross hematuria.  She was scheduled for a CT urogram, but she was seen in the emergency room a few weeks after her visit. A CT renal stone study at that time noted a left UVJ stone. She  states she did pass that stone.    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  She is currently on anticoagulation therapy.  She did stop it today in anticipation of a possible surgical procedure.  PMH: Past Medical History:  Diagnosis Date  . Heart attack ([place]) 03/2014  . Heart disease   . History of C-section   . HLD (hyperlipidemia)   . Hx of heart artery stent   . Hypertension     Surgical History: Past Surgical History:  Procedure Laterality Date  . CARDIAC SURGERY    . CESAREAN SECTION    . CHOLECYSTECTOMY    . LEFT HEART CATHETERIZATION WITH CORONARY ANGIOGRAM N/A 04/06/2014   Procedure: LEFT HEART CATHETERIZATION WITH CORONARY ANGIOGRAM;  Surgeon: Jettie Booze, MD;  Location: [facility] CATH LAB;  Service: Cardiovascular;  Laterality: N/A;    Home Medications:    Medication List       Accurate as of 10/18/15  9:52 PM. Always use your most recent med list.          ALIGN 4 MG Caps Take 4 mg by mouth 2 (two) times daily.   aspirin 81 MG chewable tablet Chew 1 tablet (81 mg total) by mouth daily.   clopidogrel 75 MG tablet Commonly known  as:  PLAVIX TAKE 1 TABLET (75 MG TOTAL) BY MOUTH DAILY.   escitalopram 10 MG tablet Commonly known as:  LEXAPRO Take 1 tablet (10 mg total) by mouth daily.   HYDROcodone-acetaminophen 5-325 MG tablet Commonly known as:  NORCO/VICODIN Take 1 tablet by mouth every 6 (six) hours as needed for moderate pain.   isosorbide mononitrate 30 MG 24 hr tablet Commonly known as:  IMDUR TAKE 1 TABLET (30 MG TOTAL) BY MOUTH DAILY.   lisinopril 5 MG tablet Commonly known as:  PRINIVIL,ZESTRIL TAKE 1 TABLET EVERY DAY   metoprolol tartrate 25 MG tablet Commonly known as:  LOPRESSOR Take 1 tablet (25 mg total) by mouth 2 (two) times daily.   nitroGLYCERIN 0.4 MG SL tablet Commonly known as:  NITROSTAT Place 1 tablet (0.4 mg total) under the tongue every 5  (five) minutes x 3 doses as needed for chest pain.   predniSONE 10 MG tablet Commonly known as:  DELTASONE Take 6 pills today, 5 pills day 2, 4 pills day 3, 3 pills day 4, 2 pills day 5, 1 pill day 6.   rosuvastatin 10 MG tablet Commonly known as:  CRESTOR Take 1 tablet (10 mg total) by mouth daily.   tamsulosin 0.4 MG Caps capsule Commonly known as:  FLOMAX Take 1 capsule (0.4 mg total) by mouth daily.       Allergies:  Allergies  Allergen Reactions  . Latex Dermatitis    Family History: Family History  Problem Relation Age of Onset  . Heart attack Father 11    died of MI at age 69  . Hypertension Mother   . Hyperlipidemia Mother   . Kidney Stones Sister   . Kidney Stones Brother   . Stroke Paternal Grandfather   . Liver cancer Maternal Grandmother   . Kidney disease Neg Hx   . Bladder Cancer Neg Hx     Social History:  reports that she has quit smoking. She has never used smokeless tobacco. She reports that she does not drink alcohol or use drugs.  ROS: UROLOGY Frequent Urination?: No Hard to postpone urination?: Yes Burning/pain with urination?: Yes Get up at night to urinate?: No Leakage of urine?: Yes Urine stream starts and stops?: No Trouble starting stream?: No Do you have to strain to urinate?: No Blood in urine?: Yes Urinary tract infection?: No Sexually transmitted disease?: No Injury to kidneys or bladder?: No Painful intercourse?: No Weak stream?: No Currently pregnant?: No Vaginal bleeding?: No Last menstrual period?: n  Gastrointestinal Nausea?: Yes Vomiting?: Yes Indigestion/heartburn?: No Diarrhea?: No Constipation?: No  Constitutional Fever: Yes Night sweats?: Yes Weight loss?: Yes Fatigue?: Yes  Skin Skin rash/lesions?: No Itching?: No  Eyes Blurred vision?: No Double vision?: No  Ears/Nose/Throat Sore throat?: No Sinus problems?: No  Hematologic/Lymphatic Swollen glands?: No Easy bruising?:  No  Cardiovascular Leg swelling?: No Chest pain?: No  Respiratory Cough?: Yes Shortness of breath?: No  Endocrine Excessive thirst?: Yes  Musculoskeletal Back pain?: No Joint pain?: No  Neurological Headaches?: Yes Dizziness?: No  Psychologic Depression?: No Anxiety?: No  Physical Exam: BP 138/86   Pulse 69   Temp 97.9 F (36.6 C) (Oral)   Ht '5\' 5"'$  (1.651 m)   Wt 247 lb 1.6 oz (112.1 kg)   BMI 41.12 kg/m   Constitutional:  Alert and oriented, No acute distress. HEENT: [place] AT, moist mucus membranes.  Trachea midline, no masses. Cardiovascular: No clubbing, cyanosis, or edema. Respiratory: Normal respiratory effort, no increased work of  breathing. GI: Abdomen is soft, nontender, nondistended, no abdominal masses GU: No CVA tenderness.  Skin: No rashes, bruises or suspicious lesions. Lymph: No cervical or inguinal adenopathy. Neurologic: Grossly intact, no focal deficits, moving all 4 extremities. Psychiatric: Normal mood and affect.  Laboratory Data:  Lab Results  Component Value Date   WBC 8.4 05/07/2014   HGB 12.9 05/07/2014   HCT 38.6 05/07/2014   MCV 85 05/07/2014   PLT 245 05/07/2014    Lab Results  Component Value Date   CREATININE 0.63 05/26/2014    Lab Results  Component Value Date   HGBA1C 5.6 04/06/2014    Urinalysis Results for orders placed or performed in visit on 10/18/15  Microscopic Examination  Result Value Ref Range   WBC, UA 0-5 0 - 5 /hpf   RBC, UA 3-10 (A) 0 - 2 /hpf   Epithelial Cells (non renal) >10 (A) 0 - 10 /hpf   Bacteria, UA None seen None seen/Few  Urinalysis, Complete  Result Value Ref Range   Specific Gravity, UA 1.015 1.005 - 1.030   pH, UA 5.5 5.0 - 7.5   Color, UA Yellow Yellow   Appearance Ur Cloudy (A) Clear   Leukocytes, UA Trace (A) Negative   Protein, UA Negative Negative/Trace   Glucose, UA Negative Negative   Ketones, UA Negative Negative   RBC, UA 1+ (A) Negative   Bilirubin, UA Negative  Negative   Urobilinogen, Ur 0.2 0.2 - 1.0 mg/dL   Nitrite, UA Negative Negative   Microscopic Examination See below:     Results for orders placed or performed in visit on 10/18/15  Microscopic Examination  Result Value Ref Range   WBC, UA 0-5 0 - 5 /hpf   RBC, UA 3-10 (A) 0 - 2 /hpf   Epithelial Cells (non renal) >10 (A) 0 - 10 /hpf   Bacteria, UA None seen None seen/Few  Urinalysis, Complete  Result Value Ref Range   Specific Gravity, UA 1.015 1.005 - 1.030   pH, UA 5.5 5.0 - 7.5   Color, UA Yellow Yellow   Appearance Ur Cloudy (A) Clear   Leukocytes, UA Trace (A) Negative   Protein, UA Negative Negative/Trace   Glucose, UA Negative Negative   Ketones, UA Negative Negative   RBC, UA 1+ (A) Negative   Bilirubin, UA Negative Negative   Urobilinogen, Ur 0.2 0.2 - 1.0 mg/dL   Nitrite, UA Negative Negative   Microscopic Examination See below:     Assessment & Plan:    Patient will undergo right ureteroscopy with laser lithotripsy with ureteral stent placement for definitive treatment of a 12 mm right ureterovesicular junction stone and a 2 mm distal ureteral stone after she receives cardiac clearance.  1. Right UVJ stone  - discussed management options for the stone, such as MET, ESWL or URS/LL/ureteral stent placement  - MET is not recommended due to the size of the stone and she has been unsuccessful with passing the stone since its discovery over Labor Day weekend  - I explained that I could not give the success rate of ESWL because I could not measure the density of the stone or the skin to stone ratio on the disc- explained the risk of the treatment being ineffective or not breaking up the stone sufficiently resulting in the need for an additional treatment  - I recommended right URS/LL/ureteral stent placement as her most successful attempt at ridding her of the stones  - explained to the patient how  the URS procedure is performed and the risks involved  - informed patient  that they will have a stent placed during the procedure and will remain in place after the procedure for a short time.   - stent may be removed in the office with a cystoscope or patient may be instructed to remove the stent themselves by the string  - described "stent pain" as feelings of needing to urinate/overactive bladder and a warm, tingling sensation to intense pain in the affected flank  - residual stones within the kidney or ureter may be present after the procedure and may need to have these addressed at a different encounter  - injury to the ureter is the most common intra-operative risk, it may result in an open procedure to correct the defect  - infection and bleeding are also risks  - explained the risks of general anesthesia, such as: MI, CVA, paralysis, coma and/or death.  - advised to contact our office or seek treatment in the ED if becomes febrile or pain/ vomiting are difficult control in order to arrange for emergent/urgent intervention  - patient's questions are answered and she is willing to proceed with URS/LL/ureteral stent placement, she will need cardiac clearance prior to proceeding  - refills given for Vicodin and tamsulosin  2. Right hydronephrosis  - schedule RUS after definitive treatment of her stone  3. Microscopic hematuria  - We will continue to monitor the patient's UA after the treatment/passage of the stone to ensure the hematuria has resolved.  If hematuria persists, we will pursue a hematuria workup with CT Urogram and cystoscopy if appropriate.  4. History of nephrolithiasis  - consider 24 urine in the future  Return for right URS/LL/ureteral stent placement.  Zara Council, [facility] [address], [place], [place] [address] [phone]

## 2015-10-19 ENCOUNTER — Telehealth: Payer: Self-pay | Admitting: Radiology

## 2015-10-19 ENCOUNTER — Encounter: Payer: Self-pay | Admitting: Interventional Cardiology

## 2015-10-19 ENCOUNTER — Other Ambulatory Visit: Payer: Self-pay | Admitting: Radiology

## 2015-10-19 DIAGNOSIS — N200 Calculus of kidney: Secondary | ICD-10-CM

## 2015-10-19 LAB — CBC WITH DIFFERENTIAL/PLATELET
BASOS ABS: 0 10*3/uL (ref 0.0–0.2)
Basos: 0 %
EOS (ABSOLUTE): 0.1 10*3/uL (ref 0.0–0.4)
Eos: 2 %
Hematocrit: 40.4 % (ref 34.0–46.6)
Hemoglobin: 13.5 g/dL (ref 11.1–15.9)
IMMATURE GRANS (ABS): 0 10*3/uL (ref 0.0–0.1)
IMMATURE GRANULOCYTES: 0 %
LYMPHS: 31 %
Lymphocytes Absolute: 2.2 10*3/uL (ref 0.7–3.1)
MCH: 28.6 pg (ref 26.6–33.0)
MCHC: 33.4 g/dL (ref 31.5–35.7)
MCV: 86 fL (ref 79–97)
Monocytes Absolute: 0.4 10*3/uL (ref 0.1–0.9)
Monocytes: 5 %
NEUTROS PCT: 62 %
Neutrophils Absolute: 4.3 10*3/uL (ref 1.4–7.0)
PLATELETS: 266 10*3/uL (ref 150–379)
RBC: 4.72 x10E6/uL (ref 3.77–5.28)
RDW: 13.7 % (ref 12.3–15.4)
WBC: 7.1 10*3/uL (ref 3.4–10.8)

## 2015-10-19 LAB — BASIC METABOLIC PANEL
BUN / CREAT RATIO: 18 (ref 9–23)
BUN: 14 mg/dL (ref 6–24)
CALCIUM: 9.5 mg/dL (ref 8.7–10.2)
CHLORIDE: 101 mmol/L (ref 96–106)
CO2: 26 mmol/L (ref 18–29)
Creatinine, Ser: 0.77 mg/dL (ref 0.57–1.00)
GFR calc Af Amer: 103 mL/min/{1.73_m2} (ref >59)
GFR calc non Af Amer: 90 mL/min/{1.73_m2} (ref >59)
GLUCOSE: 87 mg/dL (ref 65–99)
Potassium: 4.1 mmol/L (ref 3.5–5.2)
Sodium: 142 mmol/L (ref 134–144)

## 2015-10-19 NOTE — Telephone Encounter (Signed)
Notified pt of surgery clearance appt with Ellen Henri at Dr Hassell Done office on 10/21/15 @8 :00. Pt voices understanding.

## 2015-10-19 NOTE — Telephone Encounter (Signed)
Notified patient of surgery scheduled with Dr. Erlene Quan on 11/01/15, preadmission testing appointment on 10/21/2015 at 1245, and to call Friday prior to surgery for arrival time to same day surgery. Pt voices understanding.

## 2015-10-20 LAB — CULTURE, URINE COMPREHENSIVE

## 2015-10-21 ENCOUNTER — Telehealth: Payer: Self-pay

## 2015-10-21 ENCOUNTER — Encounter (INDEPENDENT_AMBULATORY_CARE_PROVIDER_SITE_OTHER): Payer: Self-pay

## 2015-10-21 ENCOUNTER — Encounter: Payer: Self-pay | Admitting: *Deleted

## 2015-10-21 ENCOUNTER — Encounter
Admission: RE | Admit: 2015-10-21 | Discharge: 2015-10-21 | Disposition: A | Discharge status: Home / Self Care | Payer: BLUE CROSS/BLUE SHIELD | Source: Ambulatory Visit | Attending: Urology | Admitting: Urology

## 2015-10-21 ENCOUNTER — Ambulatory Visit (INDEPENDENT_AMBULATORY_CARE_PROVIDER_SITE_OTHER): Payer: BLUE CROSS/BLUE SHIELD | Admitting: Cardiology

## 2015-10-21 VITALS — BP 140/80 | HR 72 | Ht 65.0 in | Wt 245.8 lb

## 2015-10-21 DIAGNOSIS — Z01818 Encounter for other preprocedural examination: Secondary | ICD-10-CM | POA: Insufficient documentation

## 2015-10-21 DIAGNOSIS — I2510 Atherosclerotic heart disease of native coronary artery without angina pectoris: Secondary | ICD-10-CM

## 2015-10-21 DIAGNOSIS — R0602 Shortness of breath: Secondary | ICD-10-CM | POA: Diagnosis not present

## 2015-10-21 DIAGNOSIS — E785 Hyperlipidemia, unspecified: Secondary | ICD-10-CM

## 2015-10-21 NOTE — Patient Instructions (Signed)
  Your procedure is scheduled on: Mon.11/01/15 Report to Day Surgery. To find out your arrival time please call [phone] between 1PM - 3PM on Friday 10/29/15  Remember: Instructions that are not followed completely may result in serious medical risk, up to and including death, or upon the discretion of your surgeon and anesthesiologist your surgery may need to be rescheduled.    __x__ 1. Do not eat food or drink liquids after midnight. No gum chewing or hard candies.     __x__ 2. No Alcohol for 24 hours before or after surgery.   ____ 3. Do Not Smoke For 24 Hours Prior to Your Surgery.   ____ 4. Bring all medications with you on the day of surgery if instructed.    __x__ 5. Notify your doctor if there is any change in your medical condition     (cold, fever, infections).       Do not wear jewelry, make-up, hairpins, clips or nail polish.  Do not wear lotions, powders, or perfumes. You may wear deodorant.  Do not shave 48 hours prior to surgery. Men may shave face and neck.  Do not bring valuables to the hospital.    [facility] is not responsible for any belongings or valuables.               Contacts, dentures or bridgework may not be worn into surgery.  Leave your suitcase in the car. After surgery it may be brought to your room.  For patients admitted to the hospital, discharge time is determined by your                treatment team.   Patients discharged the day of surgery will not be allowed to drive home.   Please read over the following fact sheets that you were given:      __x__ Take these medicines the morning of surgery with A SIP OF WATER:    1. escitalopram (LEXAPRO) 10 MG tablet  2. HYDROcodone-acetaminophen (NORCO/VICODIN) 5-325 MG tablet if needed  3. isosorbide mononitrate (IMDUR) 30 MG 24 hr tablet  4.lisinopril (PRINIVIL,ZESTRIL) 5 MG tablet  5.metoprolol tartrate (LOPRESSOR) 25 MG tablet  6.  ____ Fleet Enema (as directed)   ___ Use CHG Soap as  directed  ____ Use inhalers on the day of surgery  ____ Stop metformin 2 days prior to surgery    ____ Take 1/2 of usual insulin dose the night before surgery and none on the morning of surgery.   _x___ Stop Plavix/aspirin on 10/25/15  __x__ Stop Anti-inflammatories on  10/25/15   ____ Stop supplements until after surgery.    ____ Bring C-Pap to the hospital.

## 2015-10-21 NOTE — Pre-Procedure Instructions (Signed)
Dr. Hassell Done office notified of need for cardiac clearance.  Faxed request.

## 2015-10-21 NOTE — Progress Notes (Signed)
10/21/2015 Sheri [name]   [birth year]  [id number]  Primary Physician Sheri Annamary Rummage, NP Primary Cardiologist: Dr. Irish Lack   Reason for Visit/CC: Surgical Clearance  HPI:  The patient is a 50 year old female, followed by Dr. Irish Lack, who presents to clinic today for preoperative clearance. She is scheduled to undergo ureteroscopy with laser lithotripsy with Dr. Erlene Quan, urologist, on 11/01/15, for nephrolithiasis.    She has a known history of CAD. In March 2016, she was admitted for an inferior MI. Left heart catheterization showed culprit vessel to be an occluded RCA for which she underwent PCI plus stenting. 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 This was treated medically. Left ventricular ejection fraction was normal. She was placed on dual antiplatelet therapy. Several months later, she was seen by Dr. Irish Lack in October 2016 for exertional dyspnea. There was question as to whether or not this was her anginal equivalent, secondary to her circumflex disease. Subsequently Dr. Beau Fanny ordered for her to undergo a nuclear stress test to assess for underlying ischemia.  This was a low risk study. There was a small, mild intensity lateral fixed perfusion defect which was felt to be attenuation artifact. There was no significant reversible ischemia. LVEF was 64% with normal wall motion.  2D echo also confirmed normal LVEF and BNP was also normal. Additional past medical history also includes hypertension and hyperlipidemia for which she is being treated for.  She denies CP but continues to have exertional dyspnea. She has to stop walking up a flight of stairs. She does ok walking on flat surfaces. No resting dyspnea. She does not feel that her symptoms have worsened any since her last stress test. She is moderately obese, which might be a contributing. EKG today shows NSR.    Current Meds  Medication Sig  . aspirin 81 MG chewable tablet Chew 1 tablet (81 mg total)  by mouth daily.  . clopidogrel (PLAVIX) 75 MG tablet TAKE 1 TABLET (75 MG TOTAL) BY MOUTH DAILY.  Marland Kitchen escitalopram (LEXAPRO) 10 MG tablet Take 1 tablet (10 mg total) by mouth daily.  Marland Kitchen HYDROcodone-acetaminophen (NORCO/VICODIN) 5-325 MG tablet Take 1 tablet by mouth every 6 (six) hours as needed for moderate pain.  . isosorbide mononitrate (IMDUR) 30 MG 24 hr tablet TAKE 1 TABLET (30 MG TOTAL) BY MOUTH DAILY.  Marland Kitchen lisinopril (PRINIVIL,ZESTRIL) 5 MG tablet TAKE 1 TABLET EVERY DAY  . metoprolol tartrate (LOPRESSOR) 25 MG tablet Take 1 tablet (25 mg total) by mouth 2 (two) times daily.  . nitroGLYCERIN (NITROSTAT) 0.4 MG SL tablet Place 1 tablet (0.4 mg total) under the tongue every 5 (five) minutes x 3 doses as needed for chest pain.  . predniSONE (DELTASONE) 10 MG tablet Take 6 pills today, 5 pills day 2, 4 pills day 3, 3 pills day 4, 2 pills day 5, 1 pill day 6.  . Probiotic Product (ALIGN) 4 MG CAPS Take 4 mg by mouth 2 (two) times daily.  . rosuvastatin (CRESTOR) 10 MG tablet Take 1 tablet (10 mg total) by mouth daily.  . tamsulosin (FLOMAX) 0.4 MG CAPS capsule Take 1 capsule (0.4 mg total) by mouth daily.   Allergies  Allergen Reactions  . Latex Dermatitis   Past Medical History:  Diagnosis Date  . Heart attack ([place]) 03/2014  . Heart disease   . History of C-section   . HLD (hyperlipidemia)   . Hx of heart artery stent   . Hypertension    Family  History  Problem Relation Age of Onset  . Heart attack Father 47    died of MI at age 30  . Hypertension Mother   . Hyperlipidemia Mother   . Kidney Stones Sister   . Kidney Stones Brother   . Stroke Paternal Grandfather   . Liver cancer Maternal Grandmother   . Kidney disease Neg Hx   . Bladder Cancer Neg Hx    Past Surgical History:  Procedure Laterality Date  . CARDIAC SURGERY    . CESAREAN SECTION    . CHOLECYSTECTOMY    . LEFT HEART CATHETERIZATION WITH CORONARY ANGIOGRAM N/A 04/06/2014   Procedure: LEFT HEART CATHETERIZATION  WITH CORONARY ANGIOGRAM;  Surgeon: Jettie Booze, MD;  Location: [facility] CATH LAB;  Service: Cardiovascular;  Laterality: N/A;   Social History   Social History  . Marital status: Married    Spouse name: N/A  . Number of children: N/A  . Years of education: N/A   Occupational History  . Not on file.   Social History Main Topics  . Smoking status: Former Research scientist (life sciences)  . Smokeless tobacco: Never Used     Comment: quit 20 years  . Alcohol use No  . Drug use: No  . Sexual activity: Not on file   Other Topics Concern  . Not on file   Social History Narrative  . No narrative on file     Review of Systems: General: negative for chills, fever, night sweats or weight changes.  Cardiovascular: negative for chest pain, dyspnea on exertion, edema, orthopnea, palpitations, paroxysmal nocturnal dyspnea or shortness of breath Dermatological: negative for rash Respiratory: negative for cough or wheezing Urologic: negative for hematuria Abdominal: negative for nausea, vomiting, diarrhea, bright red blood per rectum, melena, or hematemesis Neurologic: negative for visual changes, syncope, or dizziness All other systems reviewed and are otherwise negative except as noted above.   Physical Exam:  Blood pressure 140/80, pulse 72, height 5\' 5"  (1.651 m), weight 245 lb 12.8 oz (111.5 kg).  General appearance: alert, cooperative and no distress Neck: no carotid bruit and no JVD Lungs: clear to auscultation bilaterally Heart: regular rate and rhythm, S1, S2 normal, no murmur, click, rub or gallop Extremities: no LEE Pulses: 2+ and symmetric Skin: warm and dry Neurologic: Grossly normal  EKG NSR. No ischemia.   ASSESSMENT AND PLAN:    1. CAD: s/p Interior MI in 03/2014 treated with PCI + DES to RCA. 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 However NST 10/2014 was negative for ischemia. She denies any CP. EKG NSR w/o ischemia. She is on DAPT with ASA + Plavix, Crestor, metoprolol,  Imdur and lisinopril.   2. Nephrolithiasis: tentatively scheduled to undergo ureteroscopy with laser lithotripsy with Dr. Erlene Quan, urologist, on 11/01/15.  3. Preoperative Assessment: I've discussed case with Dr. Irish Lack, patient's primary cardiologist. He has reviewed her EKG which is normal. She denies any CP. She has a NST 10/2014 that was low risk for ischemia. EF normal. Physical exam is benign. RRR. No murmurs. Carotids w/o bruits. VSS. She is considered low risk for surgery. Ok to undergo ureteroscopy with laser lithotripsy. It has been >12 months since her coronary stenting. Ok to hold Plavix for 5 days prior to surgery. Patient should resume, once ok with Dr. Erlene Quan. Continue BB and statin during there perioperative period.   PLAN  F/u with Dr. Irish Lack in 6 months.   Addam Goeller PA-C 10/21/2015 8:15 AM

## 2015-10-21 NOTE — Telephone Encounter (Signed)
**Note De-Identified Sheri [name] Obfuscation** The pt needs to have ureteroscopy, laser lithotopsy, right ureteral stent placement with Dr Lisette Abu at [facility]. They are requesting: 1.  Cardiac clearance. 2. That the pt hold Plavix and Asa 7 days prior to surgery.  Please advise.

## 2015-10-21 NOTE — Patient Instructions (Signed)
Medication Instructions:  Your physician recommends that you continue on your current medications as directed. Please refer to the Current Medication list given to you today.   Labwork: None ordered  Testing/Procedures: None ordered  Follow-Up: Your physician wants you to follow-up in: [place] DR. VARANASI You will receive a reminder letter in the mail two months in advance. If you don't receive a letter, please call our office to schedule the follow-up appointment.   Any Other Special Instructions Will Be Listed Below (If Applicable).  You have been cleared for your upcoming procedure.  We will notify the provider.   If you need a refill on your cardiac medications before your next appointment, please call your pharmacy.

## 2015-10-21 NOTE — Telephone Encounter (Signed)
This message has been faxed to [facility] ATT: Amy at [phone].  I did receive confirmation that the fax was successful.

## 2015-10-21 NOTE — Telephone Encounter (Signed)
No further cardiac testing needed before lithotripsy.   OK to hold aspirin and Plavix 7 days prior to procedure.

## 2015-10-21 NOTE — Telephone Encounter (Signed)
Per Dr Irish Lack, instructed pt to hold ASA 81mg  & Plavix beginning 10/25/15. Also reminded pt to call Friday prior to surgery for arrival time to SDS. Pt voices understanding.

## 2015-10-28 ENCOUNTER — Other Ambulatory Visit: Payer: Self-pay | Admitting: Interventional Cardiology

## 2015-11-01 ENCOUNTER — Ambulatory Visit: Payer: BLUE CROSS/BLUE SHIELD | Admitting: Anesthesiology

## 2015-11-01 ENCOUNTER — Encounter
Admission: RE | Disposition: A | Discharge status: Home / Self Care | Payer: Self-pay | Source: Ambulatory Visit | Attending: Urology

## 2015-11-01 ENCOUNTER — Ambulatory Visit
Admission: RE | Admit: 2015-11-01 | Discharge: 2015-11-01 | Disposition: A | Discharge status: Home / Self Care | Payer: BLUE CROSS/BLUE SHIELD | Source: Ambulatory Visit | Attending: Urology | Admitting: Urology

## 2015-11-01 DIAGNOSIS — I10 Essential (primary) hypertension: Secondary | ICD-10-CM | POA: Insufficient documentation

## 2015-11-01 DIAGNOSIS — N200 Calculus of kidney: Secondary | ICD-10-CM | POA: Diagnosis not present

## 2015-11-01 DIAGNOSIS — Z955 Presence of coronary angioplasty implant and graft: Secondary | ICD-10-CM | POA: Insufficient documentation

## 2015-11-01 DIAGNOSIS — I252 Old myocardial infarction: Secondary | ICD-10-CM | POA: Diagnosis not present

## 2015-11-01 DIAGNOSIS — Z87891 Personal history of nicotine dependence: Secondary | ICD-10-CM | POA: Diagnosis not present

## 2015-11-01 DIAGNOSIS — N201 Calculus of ureter: Secondary | ICD-10-CM | POA: Diagnosis not present

## 2015-11-01 DIAGNOSIS — N132 Hydronephrosis with renal and ureteral calculous obstruction: Secondary | ICD-10-CM | POA: Insufficient documentation

## 2015-11-01 DIAGNOSIS — R109 Unspecified abdominal pain: Secondary | ICD-10-CM | POA: Diagnosis not present

## 2015-11-01 DIAGNOSIS — I2510 Atherosclerotic heart disease of native coronary artery without angina pectoris: Secondary | ICD-10-CM | POA: Diagnosis not present

## 2015-11-01 DIAGNOSIS — E785 Hyperlipidemia, unspecified: Secondary | ICD-10-CM | POA: Diagnosis not present

## 2015-11-01 DIAGNOSIS — N135 Crossing vessel and stricture of ureter without hydronephrosis: Secondary | ICD-10-CM | POA: Diagnosis not present

## 2015-11-01 HISTORY — PX: CYSTOSCOPY/RETROGRADE/URETEROSCOPY: SHX5316

## 2015-11-01 HISTORY — PX: CYSTOSCOPY WITH STENT PLACEMENT: SHX5790

## 2015-11-01 LAB — POCT PREGNANCY, URINE: Preg Test, Ur: NEGATIVE

## 2015-11-01 SURGERY — CYSTOSCOPY, WITH STENT INSERTION
Anesthesia: General | Laterality: Right

## 2015-11-01 MED ORDER — ONDANSETRON HCL 4 MG PO TABS: 4.0000 mg | ORAL_TABLET | Freq: Three times a day (TID) | ORAL | 0 refills | Status: DC | PRN

## 2015-11-01 MED ORDER — OXYBUTYNIN CHLORIDE 5 MG PO TABS: 5.0000 mg | ORAL_TABLET | Freq: Three times a day (TID) | ORAL | 0 refills | Status: DC | PRN

## 2015-11-01 MED ORDER — PROPOFOL 10 MG/ML IV BOLUS
INTRAVENOUS | Status: DC | PRN
  Administered 2015-11-01 (×2): 150 mg via INTRAVENOUS

## 2015-11-01 MED ORDER — NEOSTIGMINE METHYLSULFATE 10 MG/10ML IV SOLN
INTRAVENOUS | Status: DC | PRN
  Administered 2015-11-01: 5 mg via INTRAVENOUS

## 2015-11-01 MED ORDER — ONDANSETRON HCL 4 MG/2ML IJ SOLN
INTRAMUSCULAR | Status: DC | PRN
  Administered 2015-11-01: 4 mg via INTRAVENOUS

## 2015-11-01 MED ORDER — EPHEDRINE SULFATE 50 MG/ML IJ SOLN
INTRAMUSCULAR | Status: DC | PRN
  Administered 2015-11-01: 5 mg via INTRAVENOUS
  Administered 2015-11-01: 10 mg via INTRAVENOUS
  Administered 2015-11-01: 5 mg via INTRAVENOUS

## 2015-11-01 MED ORDER — PHENYLEPHRINE HCL 10 MG/ML IJ SOLN
INTRAMUSCULAR | Status: DC | PRN
  Administered 2015-11-01: 200 ug via INTRAVENOUS
  Administered 2015-11-01: 100 ug via INTRAVENOUS
  Administered 2015-11-01 (×2): 200 ug via INTRAVENOUS
  Administered 2015-11-01: 100 ug via INTRAVENOUS
  Administered 2015-11-01: 200 ug via INTRAVENOUS

## 2015-11-01 MED ORDER — HYDROCODONE-ACETAMINOPHEN 5-325 MG PO TABS: 1.0000 | ORAL_TABLET | Freq: Four times a day (QID) | ORAL | 0 refills | Status: DC | PRN

## 2015-11-01 MED ORDER — FAMOTIDINE 20 MG PO TABS
ORAL_TABLET | ORAL | Status: AC
  Administered 2015-11-01: 20 mg via ORAL
  Filled 2015-11-01: qty 1

## 2015-11-01 MED ORDER — METOPROLOL TARTRATE 5 MG/5ML IV SOLN
INTRAVENOUS | Status: DC | PRN
  Administered 2015-11-01: 5 mg via INTRAVENOUS

## 2015-11-01 MED ORDER — MIDAZOLAM HCL 2 MG/2ML IJ SOLN
INTRAMUSCULAR | Status: DC | PRN
  Administered 2015-11-01: 2 mg via INTRAVENOUS

## 2015-11-01 MED ORDER — FENTANYL CITRATE (PF) 100 MCG/2ML IJ SOLN
INTRAMUSCULAR | Status: AC
  Administered 2015-11-01: 25 ug via INTRAVENOUS
  Filled 2015-11-01: qty 2

## 2015-11-01 MED ORDER — LACTATED RINGERS IV SOLN
INTRAVENOUS | Status: DC
  Administered 2015-11-01 (×3): via INTRAVENOUS

## 2015-11-01 MED ORDER — FENTANYL CITRATE (PF) 100 MCG/2ML IJ SOLN
25.0000 ug | INTRAMUSCULAR | Status: DC | PRN
  Administered 2015-11-01 (×2): 25 ug via INTRAVENOUS

## 2015-11-01 MED ORDER — HYDROCODONE-ACETAMINOPHEN 5-325 MG PO TABS
ORAL_TABLET | ORAL | Status: AC
  Administered 2015-11-01: 1 via ORAL
  Filled 2015-11-01: qty 1

## 2015-11-01 MED ORDER — CEFAZOLIN SODIUM-DEXTROSE 2-4 GM/100ML-% IV SOLN
2.0000 g | INTRAVENOUS | Status: AC
  Administered 2015-11-01: 2 g via INTRAVENOUS

## 2015-11-01 MED ORDER — IOTHALAMATE MEGLUMINE 43 % IV SOLN
INTRAVENOUS | Status: DC | PRN
  Administered 2015-11-01: 20 mL via URETHRAL

## 2015-11-01 MED ORDER — ONDANSETRON HCL 4 MG/2ML IJ SOLN: 4.0000 mg | Freq: Once | INTRAMUSCULAR | Status: DC | PRN

## 2015-11-01 MED ORDER — GLYCOPYRROLATE 0.2 MG/ML IJ SOLN
INTRAMUSCULAR | Status: DC | PRN
Start: 2015-11-01 — End: 2015-11-01
  Administered 2015-11-01: 1 mg via INTRAVENOUS

## 2015-11-01 MED ORDER — CEFAZOLIN SODIUM-DEXTROSE 2-4 GM/100ML-% IV SOLN
INTRAVENOUS | Status: AC
  Administered 2015-11-01: 2 g via INTRAVENOUS
  Filled 2015-11-01: qty 100

## 2015-11-01 MED ORDER — HYDROCODONE-ACETAMINOPHEN 5-325 MG PO TABS: 1.0000 | ORAL_TABLET | Freq: Four times a day (QID) | ORAL | Status: DC | PRN

## 2015-11-01 MED ORDER — FENTANYL CITRATE (PF) 100 MCG/2ML IJ SOLN
INTRAMUSCULAR | Status: DC | PRN
Start: 2015-11-01 — End: 2015-11-01
  Administered 2015-11-01: 100 ug via INTRAVENOUS

## 2015-11-01 MED ORDER — ROCURONIUM BROMIDE 100 MG/10ML IV SOLN
INTRAVENOUS | Status: DC | PRN
  Administered 2015-11-01: 30 mg via INTRAVENOUS

## 2015-11-01 MED ORDER — LIDOCAINE HCL (CARDIAC) 20 MG/ML IV SOLN
INTRAVENOUS | Status: DC | PRN
  Administered 2015-11-01: 100 mg via INTRAVENOUS

## 2015-11-01 MED ORDER — FAMOTIDINE 20 MG PO TABS
20.0000 mg | ORAL_TABLET | Freq: Once | ORAL | Status: AC
  Administered 2015-11-01: 20 mg via ORAL

## 2015-11-01 SURGICAL SUPPLY — 32 items
ADAPTER SCOPE UROLOK II (MISCELLANEOUS) IMPLANT
ADHESIVE MASTISOL STRL (MISCELLANEOUS) ×4 IMPLANT
BAG DRAIN CYSTO-URO LG1000N (MISCELLANEOUS) ×4 IMPLANT
BASKET ZERO TIP 1.9FR (BASKET) IMPLANT
CATH FOL 2WAY LX 16X5 (CATHETERS) IMPLANT
CATH URETL 5X70 OPEN END (CATHETERS) ×4 IMPLANT
CNTNR SPEC 2.5X3XGRAD LEK (MISCELLANEOUS)
CONRAY 43 FOR UROLOGY 50M (MISCELLANEOUS) ×4 IMPLANT
CONT SPEC 4OZ STER OR WHT (MISCELLANEOUS)
CONTAINER SPEC 2.5X3XGRAD LEK (MISCELLANEOUS) IMPLANT
DRAPE UTILITY 15X26 TOWEL STRL (DRAPES) ×4 IMPLANT
GLOVE BIO SURGEON STRL SZ 6.5 (GLOVE) ×8 IMPLANT
GOWN STRL REUS W/ TWL LRG LVL3 (GOWN DISPOSABLE) ×6 IMPLANT
GOWN STRL REUS W/ TWL LRG LVL4 (GOWN DISPOSABLE) ×6 IMPLANT
GOWN STRL REUS W/TWL LRG LVL3 (GOWN DISPOSABLE) ×2
GOWN STRL REUS W/TWL LRG LVL4 (GOWN DISPOSABLE) ×2
GUIDEWIRE SUPER STIFF (WIRE) ×4 IMPLANT
HOLDER FOLEY CATH W/STRAP (MISCELLANEOUS) IMPLANT
INTRODUCER DILATOR DOUBLE (INTRODUCER) IMPLANT
KIT RM TURNOVER CYSTO AR (KITS) ×4 IMPLANT
PACK CYSTO AR (MISCELLANEOUS) ×4 IMPLANT
SENSORWIRE 0.038 NOT ANGLED (WIRE) ×4
SET CYSTO W/LG BORE CLAMP LF (SET/KITS/TRAYS/PACK) ×4 IMPLANT
SHEATH URETERAL 12FRX35CM (MISCELLANEOUS) IMPLANT
SOL .9 NS 3000ML IRR  AL (IV SOLUTION) ×1
SOL .9 NS 3000ML IRR UROMATIC (IV SOLUTION) ×3 IMPLANT
STENT URET 6FRX24 CONTOUR (STENTS) ×4 IMPLANT
STENT URET 6FRX26 CONTOUR (STENTS) IMPLANT
SURGILUBE 2OZ TUBE FLIPTOP (MISCELLANEOUS) ×4 IMPLANT
SYRINGE IRR TOOMEY STRL 70CC (SYRINGE) ×4 IMPLANT
WATER STERILE IRR 1000ML POUR (IV SOLUTION) ×4 IMPLANT
WIRE SENSOR 0.038 NOT ANGLED (WIRE) ×3 IMPLANT

## 2015-11-01 NOTE — OR Nursing (Signed)
Pt reports no blood seen in urine.

## 2015-11-01 NOTE — Op Note (Signed)
Date of procedure: 11/01/15  Preoperative diagnosis:  1. History of right UVJ stone 2. Left flank pain 3. History of nephrolithiasis   Postoperative diagnosis:  1. Same as above   Procedure: 1. Bilateral retrograde pyelogram 2. Cystoscopy 3. Bilateral ureteroscopy Left ureteral stent placement  Surgeon: Hollice Espy, MD  Anesthesia: General  Complications: None  Intraoperative findings:  normal bilateral retrograde pyelogram. Bilateral negative ureteroscopy.  EBL: minimal  Specimens: none  Drains: 6 x 24 French double-J ureteral stent on left, string left in place   Indication: Sheri [name] is a 50 y.o. patient with with a 12 mm right UVJ stone who failed medical expulsive therapy. In the preoperative holding area today, she states that she's had severe left flank pain with associated nausea the past 4 days. She requests that both ureters be evaluated while under anesthesia seems reasonable. This was added to her consent form prior to surgery today. After reviewing the management options for treatment, she elected to proceed with the above surgical procedure(s). We have discussed the potential benefits and risks of the procedure, side effects of the proposed treatment, the likelihood of the patient achieving the goals of the procedure, and any potential problems that might occur during the procedure or recuperation. Informed consent has been obtained.  Description of procedure:  The patient was taken to the operating room and general anesthesia was induced.  The patient was placed in the dorsal lithotomy position, prepped and draped in the usual sterile fashion, and preoperative antibiotics were administered. A preoperative time-out was performed.   A 21 French scope was advanced per urethra into the bladder. Attention was turned to the right ureteral orifice which was cannulated using a 5 [place] open-ended ureteral catheter just within the UO. A gentle retrograde was performed  which revealed a delicate appearing ureter, no evidence of filling defects, and no suggestion of hydroureteronephrosis.  No stone shadows seen over the pelvis on the side. A sensor wire was then placed up to level of the kidney without difficulty. This was left in place as a safety wire. A 4.5 French semirigid ureteroscope was advanced alongside the wire all the way up to level of the UPJ without difficulty. No evidence of stones, stone fragments, edema, or masses were identified within the lumen of the ureter. The scope was then removed. The safety wire was removed and given no findings and minimal ureteral manipulation. Next  Attention was then turned to the right ureteral orifice which was also cannulated using a 5 [place] open-ended ureteral catheter. Again, gentle retrograde pyelogram was performed which revealed no evidence of hydroureteronephrosis. A sensor wire was then placed up to the level of the kidney and sent to place as a safety wire. Of semirigid ureteroscope was then advanced alongside of the wire up to level of the UPJ again wished no stone, masses, lesions, or tumors were identified no evidence of obstruction.  Given bilateral ureteral manipulation, I did elect to place a safety stent on the side. A 6 x 24 French double-J ureteral stent was advanced over the wire up to level of the kidney. Wire was partially drawn until full coil was noted within the renal pelvis. The wire simply withdrawn and a full coil was noted within the bladder. The string was left on the stent. This was affixed to the patient's left inner thigh using muscle and Tegaderm. She was cleaned and dried, repositioned the supine position, reversed from anesthesia, taken to the PACU in stable condition.  Plan:  Patient will remove her own stent on Friday. She'll follow up in our office with a renal ultrasound in 4 weeks with Zara Council. If she continues to have pain, other sources of discomfort will be further  evaluated.  Hollice Espy, M.D.

## 2015-11-01 NOTE — Transfer of Care (Signed)
Immediate Anesthesia Transfer of Care Note  Patient: Sheri [name]  Procedure(s) Performed: Procedure(s): CYSTOSCOPY WITH STENT PLACEMENT (Left) CYSTOSCOPY/RETROGRADE/URETEROSCOPY (Bilateral)  Patient Location: PACU  Anesthesia Type:General  Level of Consciousness: awake  Airway & Oxygen Therapy: Patient Spontanous Breathing  Post-op Assessment: Report given to RN  Post vital signs: Reviewed  Last Vitals:  Vitals:   11/01/15 1123 11/01/15 1420  BP: (!) 89/65   Pulse: 83   Resp: 16   Temp: 36.6 C (P) 36.5 C    Last Pain:  Vitals:   11/01/15 1123  TempSrc: Tympanic  PainSc: 3          Complications: No apparent anesthesia complications

## 2015-11-01 NOTE — H&P (View-Only) (Signed)
10/18/2015 9:52 PM   Sheri [name] 1965-11-12 [id number]  Referring provider: Luciana Axe, NP [place], [place] [address]  Chief Complaint  Patient presents with  . Nephrolithiasis    stone while in [place]    HPI: Patient is a 50 year old Caucasian female who presents/is referred by Juanda [facility]ble [facility] for nephrolithiasis.  Patient states that over Labor Day weekend she was "feeling off."  Then Monday. She started to have intense right upper quadrant pain and started to vomit violently.  She then went to the emergency room in [place], [place].  In the ED, she received IV fluids, pain medication, nausea medication and a CT scan was performed. The CT scan noted a 12 mm calcification extending into the bladder at the right UVJ and a 2 mm calcification in the distal right ureter at the level of the acetabulum that was approximately 2 mm in size. Patient brought and a disc for our review and I have personally reviewed the films.  She was offered definitive treatment for the stone at that time, but she deferred to have a procedure done locally.  Since the Labor Day weekend, she has had intermittent bouts of intense right sided pain.  She states it can still reach levels of 7-8 out of 10. She does have nausea associated with the pain.  She has not noted anything that makes the pain worse. Her pain medication seems to make the pain livable at this time.  She has not passed any fragments.    Today, she states she's having a good day. She has not had any right-sided flank pain or nausea.  She is denied fevers, chills or recent vomiting.  Her UA today is significant for 3-10 RBCs per prior field.  She does have a prior history of stones.    She was seen last year in our office for gross hematuria.  She was scheduled for a CT urogram, but she was seen in the emergency room a few weeks after her visit. A CT renal stone study at that time noted a left UVJ stone. She  states she did pass that stone.    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  She is currently on anticoagulation therapy.  She did stop it today in anticipation of a possible surgical procedure.  PMH: Past Medical History:  Diagnosis Date  . Heart attack ([place]) 03/2014  . Heart disease   . History of C-section   . HLD (hyperlipidemia)   . Hx of heart artery stent   . Hypertension     Surgical History: Past Surgical History:  Procedure Laterality Date  . CARDIAC SURGERY    . CESAREAN SECTION    . CHOLECYSTECTOMY    . LEFT HEART CATHETERIZATION WITH CORONARY ANGIOGRAM N/A 04/06/2014   Procedure: LEFT HEART CATHETERIZATION WITH CORONARY ANGIOGRAM;  Surgeon: Jettie Booze, MD;  Location: [facility] CATH LAB;  Service: Cardiovascular;  Laterality: N/A;    Home Medications:    Medication List       Accurate as of 10/18/15  9:52 PM. Always use your most recent med list.          ALIGN 4 MG Caps Take 4 mg by mouth 2 (two) times daily.   aspirin 81 MG chewable tablet Chew 1 tablet (81 mg total) by mouth daily.   clopidogrel 75 MG tablet Commonly known  as:  PLAVIX TAKE 1 TABLET (75 MG TOTAL) BY MOUTH DAILY.   escitalopram 10 MG tablet Commonly known as:  LEXAPRO Take 1 tablet (10 mg total) by mouth daily.   HYDROcodone-acetaminophen 5-325 MG tablet Commonly known as:  NORCO/VICODIN Take 1 tablet by mouth every 6 (six) hours as needed for moderate pain.   isosorbide mononitrate 30 MG 24 hr tablet Commonly known as:  IMDUR TAKE 1 TABLET (30 MG TOTAL) BY MOUTH DAILY.   lisinopril 5 MG tablet Commonly known as:  PRINIVIL,ZESTRIL TAKE 1 TABLET EVERY DAY   metoprolol tartrate 25 MG tablet Commonly known as:  LOPRESSOR Take 1 tablet (25 mg total) by mouth 2 (two) times daily.   nitroGLYCERIN 0.4 MG SL tablet Commonly known as:  NITROSTAT Place 1 tablet (0.4 mg total) under the tongue every 5  (five) minutes x 3 doses as needed for chest pain.   predniSONE 10 MG tablet Commonly known as:  DELTASONE Take 6 pills today, 5 pills day 2, 4 pills day 3, 3 pills day 4, 2 pills day 5, 1 pill day 6.   rosuvastatin 10 MG tablet Commonly known as:  CRESTOR Take 1 tablet (10 mg total) by mouth daily.   tamsulosin 0.4 MG Caps capsule Commonly known as:  FLOMAX Take 1 capsule (0.4 mg total) by mouth daily.       Allergies:  Allergies  Allergen Reactions  . Latex Dermatitis    Family History: Family History  Problem Relation Age of Onset  . Heart attack Father 43    died of MI at age 24  . Hypertension Mother   . Hyperlipidemia Mother   . Kidney Stones Sister   . Kidney Stones Brother   . Stroke Paternal Grandfather   . Liver cancer Maternal Grandmother   . Kidney disease Neg Hx   . Bladder Cancer Neg Hx     Social History:  reports that she has quit smoking. She has never used smokeless tobacco. She reports that she does not drink alcohol or use drugs.  ROS: UROLOGY Frequent Urination?: No Hard to postpone urination?: Yes Burning/pain with urination?: Yes Get up at night to urinate?: No Leakage of urine?: Yes Urine stream starts and stops?: No Trouble starting stream?: No Do you have to strain to urinate?: No Blood in urine?: Yes Urinary tract infection?: No Sexually transmitted disease?: No Injury to kidneys or bladder?: No Painful intercourse?: No Weak stream?: No Currently pregnant?: No Vaginal bleeding?: No Last menstrual period?: n  Gastrointestinal Nausea?: Yes Vomiting?: Yes Indigestion/heartburn?: No Diarrhea?: No Constipation?: No  Constitutional Fever: Yes Night sweats?: Yes Weight loss?: Yes Fatigue?: Yes  Skin Skin rash/lesions?: No Itching?: No  Eyes Blurred vision?: No Double vision?: No  Ears/Nose/Throat Sore throat?: No Sinus problems?: No  Hematologic/Lymphatic Swollen glands?: No Easy bruising?:  No  Cardiovascular Leg swelling?: No Chest pain?: No  Respiratory Cough?: Yes Shortness of breath?: No  Endocrine Excessive thirst?: Yes  Musculoskeletal Back pain?: No Joint pain?: No  Neurological Headaches?: Yes Dizziness?: No  Psychologic Depression?: No Anxiety?: No  Physical Exam: BP 138/86   Pulse 69   Temp 97.9 F (36.6 C) (Oral)   Ht 5\' 5"  (1.651 m)   Wt 247 lb 1.6 oz (112.1 kg)   BMI 41.12 kg/m   Constitutional:  Alert and oriented, No acute distress. HEENT: [place] AT, moist mucus membranes.  Trachea midline, no masses. Cardiovascular: No clubbing, cyanosis, or edema. Respiratory: Normal respiratory effort, no increased work of  breathing. GI: Abdomen is soft, nontender, nondistended, no abdominal masses GU: No CVA tenderness.  Skin: No rashes, bruises or suspicious lesions. Lymph: No cervical or inguinal adenopathy. Neurologic: Grossly intact, no focal deficits, moving all 4 extremities. Psychiatric: Normal mood and affect.  Laboratory Data:  Lab Results  Component Value Date   WBC 8.4 05/07/2014   HGB 12.9 05/07/2014   HCT 38.6 05/07/2014   MCV 85 05/07/2014   PLT 245 05/07/2014    Lab Results  Component Value Date   CREATININE 0.63 05/26/2014    Lab Results  Component Value Date   HGBA1C 5.6 04/06/2014    Urinalysis Results for orders placed or performed in visit on 10/18/15  Microscopic Examination  Result Value Ref Range   WBC, UA 0-5 0 - 5 /hpf   RBC, UA 3-10 (A) 0 - 2 /hpf   Epithelial Cells (non renal) >10 (A) 0 - 10 /hpf   Bacteria, UA None seen None seen/Few  Urinalysis, Complete  Result Value Ref Range   Specific Gravity, UA 1.015 1.005 - 1.030   pH, UA 5.5 5.0 - 7.5   Color, UA Yellow Yellow   Appearance Ur Cloudy (A) Clear   Leukocytes, UA Trace (A) Negative   Protein, UA Negative Negative/Trace   Glucose, UA Negative Negative   Ketones, UA Negative Negative   RBC, UA 1+ (A) Negative   Bilirubin, UA Negative  Negative   Urobilinogen, Ur 0.2 0.2 - 1.0 mg/dL   Nitrite, UA Negative Negative   Microscopic Examination See below:     Results for orders placed or performed in visit on 10/18/15  Microscopic Examination  Result Value Ref Range   WBC, UA 0-5 0 - 5 /hpf   RBC, UA 3-10 (A) 0 - 2 /hpf   Epithelial Cells (non renal) >10 (A) 0 - 10 /hpf   Bacteria, UA None seen None seen/Few  Urinalysis, Complete  Result Value Ref Range   Specific Gravity, UA 1.015 1.005 - 1.030   pH, UA 5.5 5.0 - 7.5   Color, UA Yellow Yellow   Appearance Ur Cloudy (A) Clear   Leukocytes, UA Trace (A) Negative   Protein, UA Negative Negative/Trace   Glucose, UA Negative Negative   Ketones, UA Negative Negative   RBC, UA 1+ (A) Negative   Bilirubin, UA Negative Negative   Urobilinogen, Ur 0.2 0.2 - 1.0 mg/dL   Nitrite, UA Negative Negative   Microscopic Examination See below:     Assessment & Plan:    Patient will undergo right ureteroscopy with laser lithotripsy with ureteral stent placement for definitive treatment of a 12 mm right ureterovesicular junction stone and a 2 mm distal ureteral stone after she receives cardiac clearance.  1. Right UVJ stone  - discussed management options for the stone, such as MET, ESWL or URS/LL/ureteral stent placement  - MET is not recommended due to the size of the stone and she has been unsuccessful with passing the stone since its discovery over Labor Day weekend  - I explained that I could not give the success rate of ESWL because I could not measure the density of the stone or the skin to stone ratio on the disc- explained the risk of the treatment being ineffective or not breaking up the stone sufficiently resulting in the need for an additional treatment  - I recommended right URS/LL/ureteral stent placement as her most successful attempt at ridding her of the stones  - explained to the patient how  the URS procedure is performed and the risks involved  - informed patient  that they will have a stent placed during the procedure and will remain in place after the procedure for a short time.   - stent may be removed in the office with a cystoscope or patient may be instructed to remove the stent themselves by the string  - described "stent pain" as feelings of needing to urinate/overactive bladder and a warm, tingling sensation to intense pain in the affected flank  - residual stones within the kidney or ureter may be present after the procedure and may need to have these addressed at a different encounter  - injury to the ureter is the most common intra-operative risk, it may result in an open procedure to correct the defect  - infection and bleeding are also risks  - explained the risks of general anesthesia, such as: MI, CVA, paralysis, coma and/or death.  - advised to contact our office or seek treatment in the ED if becomes febrile or pain/ vomiting are difficult control in order to arrange for emergent/urgent intervention  - patient's questions are answered and she is willing to proceed with URS/LL/ureteral stent placement, she will need cardiac clearance prior to proceeding  - refills given for Vicodin and tamsulosin  2. Right hydronephrosis  - schedule RUS after definitive treatment of her stone  3. Microscopic hematuria  - We will continue to monitor the patient's UA after the treatment/passage of the stone to ensure the hematuria has resolved.  If hematuria persists, we will pursue a hematuria workup with CT Urogram and cystoscopy if appropriate.  4. History of nephrolithiasis  - consider 24 urine in the future  Return for right URS/LL/ureteral stent placement.  Zara Council, [facility] [address], [place], [place] [address] [phone]

## 2015-11-01 NOTE — Anesthesia Preprocedure Evaluation (Addendum)
Anesthesia Evaluation  Patient identified by MRN, date of birth, ID band Patient awake    Reviewed: Allergy & Precautions, NPO status , Patient's Chart, lab work & pertinent test results, reviewed documented beta blocker date and time   Airway Mallampati: III  TM Distance: >3 FB     Dental  (+) Chipped   Pulmonary former smoker,           Cardiovascular hypertension, Pt. on medications and Pt. on home beta blockers + CAD, + Past MI and + Cardiac Stents       Neuro/Psych Anxiety    GI/Hepatic   Endo/Other    Renal/GU      Musculoskeletal   Abdominal   Peds  Hematology   Anesthesia Other Findings Obese. No NTG.  Reproductive/Obstetrics                            Anesthesia Physical Anesthesia Plan  ASA: III  Anesthesia Plan: General   Post-op Pain Management:    Induction: Intravenous  Airway Management Planned: Oral ETT  Additional Equipment:   Intra-op Plan:   Post-operative Plan:   Informed Consent: I have reviewed the patients History and Physical, chart, labs and discussed the procedure including the risks, benefits and alternatives for the proposed anesthesia with the patient or authorized representative who has indicated his/her understanding and acceptance.     Plan Discussed with: CRNA  Anesthesia Plan Comments:         Anesthesia Quick Evaluation

## 2015-11-01 NOTE — OR Nursing (Signed)
Pt instructed not to pull on string coming out of urethra.

## 2015-11-01 NOTE — Anesthesia Procedure Notes (Signed)
Procedure Name: Intubation Date/Time: 11/01/2015 1:39 PM Performed by: Leander Rams Pre-anesthesia Checklist: Patient identified, Emergency Drugs available, Suction available, Patient being monitored and Timeout performed Patient Re-evaluated:Patient Re-evaluated prior to inductionOxygen Delivery Method: Circle system utilized Preoxygenation: Pre-oxygenation with 100% oxygen Intubation Type: IV induction Ventilation: Mask ventilation without difficulty Laryngoscope Size: Mac and 3 Grade View: Grade I Tube type: Oral Tube size: 7.0 mm Number of attempts: 1 Airway Equipment and Method: Stylet Placement Confirmation: ETT inserted through vocal cords under direct vision,  positive ETCO2 and breath sounds checked- equal and bilateral Secured at: 21 cm Tube secured with: Tape Dental Injury: Teeth and Oropharynx as per pre-operative assessment

## 2015-11-01 NOTE — Discharge Instructions (Signed)
AMBULATORY SURGERY  DISCHARGE INSTRUCTIONS   1) The drugs that you were given will stay in your system until tomorrow so for the next 24 hours you should not:  A) Drive an automobile B) Make any legal decisions C) Drink any alcoholic beverage   2) You may resume regular meals tomorrow.  Today it is better to start with liquids and gradually work up to solid foods.  You may eat anything you prefer, but it is better to start with liquids, then soup and crackers, and gradually work up to solid foods.   3) Please notify your doctor immediately if you have any unusual bleeding, trouble breathing, redness and pain at the surgery site, drainage, fever, or pain not relieved by medication.   4) Additional Instructions: You have a ureteral stent in place.  This is a tube that extends from your kidney to your bladder.  This may cause urinary bleeding, burning with urination, and urinary frequency.  Please call our office or present to the ED if you develop fevers >101 or pain which is not able to be controlled with oral pain medications.  You may be given either Flomax and/ or ditropan to help with bladder spasms and stent pain in addition to pain medications.    You may remove your own stent on Friday AM.  Untape and remove stent in entirety.    You will follow up in 4 weeks after stent removal with renal ultrasound.    [facility] [address] [place], [place] [address] [phone]

## 2015-11-01 NOTE — Interval H&P Note (Signed)
History and Physical Interval Note:  11/01/2015 1:10 PM  Sheri [name]  has presented today for surgery, with the diagnosis of RIGHT UVJ STONE WITH OBSTRUCTION  The various methods of treatment have been discussed with the patient and family. After consideration of risks, benefits and other options for treatment, the patient has consented to  Procedure(s): URETEROSCOPY WITH HOLMIUM LASER LITHOTRIPSY (Right) CYSTOSCOPY WITH STENT PLACEMENT (Right) as a surgical intervention .  The patient's history has been reviewed, patient examined, no change in status, stable for surgery.  I have reviewed the patient's chart and labs.  Questions were answered to the patient's satisfaction.    RRR CTAB  Hollice Espy

## 2015-11-02 ENCOUNTER — Encounter: Payer: Self-pay | Admitting: Urology

## 2015-11-03 ENCOUNTER — Telehealth: Payer: Self-pay

## 2015-11-03 NOTE — Telephone Encounter (Signed)
Patient called stating that she is having terrible bladder spasms from the stent placed Monday. She is utilizing the oxybutinin and tamsulosin with out relief. Patient states that she is having frequency and terrible urge incontinence and spasm pain.  Per Dr. Erlene Quan if patient is urinating a good amount ok to remove stent today and does not have to wait until Friday.  Patient is in agreement with this plan. She was instructed to continue her oxybutinin and tamsulosin for the next 24-48 hours to help with residual spasm discomfort.

## 2015-11-08 ENCOUNTER — Other Ambulatory Visit: Payer: Self-pay | Admitting: Interventional Cardiology

## 2015-11-10 ENCOUNTER — Other Ambulatory Visit: Payer: Self-pay | Admitting: Urology

## 2015-11-11 NOTE — Anesthesia Postprocedure Evaluation (Signed)
Anesthesia Post Note  Patient: Sheri [name]  Procedure(s) Performed: Procedure(s) (LRB): CYSTOSCOPY WITH STENT PLACEMENT (Left) CYSTOSCOPY/RETROGRADE/URETEROSCOPY (Bilateral)  Patient location during evaluation: PACU Anesthesia Type: General Level of consciousness: awake and alert Pain management: pain level controlled Vital Signs Assessment: post-procedure vital signs reviewed and stable Respiratory status: spontaneous breathing, nonlabored ventilation, respiratory function stable and patient connected to nasal cannula oxygen Cardiovascular status: blood pressure returned to baseline and stable Postop Assessment: no signs of nausea or vomiting Anesthetic complications: no    Last Vitals:  Vitals:   11/01/15 1519 11/01/15 1559  BP: (!) 108/59 (!) 96/59  Pulse: 70 74  Resp: 16 16  Temp: 36.4 C     Last Pain:  Vitals:   11/01/15 1559  TempSrc:   PainSc: [place]

## 2015-11-12 ENCOUNTER — Other Ambulatory Visit: Payer: BLUE CROSS/BLUE SHIELD | Admitting: Urology

## 2015-11-15 ENCOUNTER — Other Ambulatory Visit: Payer: Self-pay | Admitting: Interventional Cardiology

## 2015-11-26 ENCOUNTER — Ambulatory Visit
Admission: RE | Admit: 2015-11-26 | Discharge: 2015-11-26 | Disposition: A | Discharge status: Home / Self Care | Payer: BLUE CROSS/BLUE SHIELD | Source: Ambulatory Visit | Attending: Urology | Admitting: Urology

## 2015-11-26 DIAGNOSIS — Z87442 Personal history of urinary calculi: Secondary | ICD-10-CM | POA: Insufficient documentation

## 2015-11-26 DIAGNOSIS — N200 Calculus of kidney: Secondary | ICD-10-CM

## 2015-11-29 ENCOUNTER — Ambulatory Visit: Payer: BLUE CROSS/BLUE SHIELD | Admitting: Urology

## 2015-12-01 ENCOUNTER — Ambulatory Visit (INDEPENDENT_AMBULATORY_CARE_PROVIDER_SITE_OTHER): Payer: BLUE CROSS/BLUE SHIELD | Admitting: Family Medicine

## 2015-12-01 ENCOUNTER — Encounter: Payer: Self-pay | Admitting: Family Medicine

## 2015-12-01 VITALS — BP 128/82 | HR 66 | Temp 98.2°F | Resp 16 | Ht 64.0 in | Wt 239.0 lb

## 2015-12-01 DIAGNOSIS — R110 Nausea: Secondary | ICD-10-CM | POA: Diagnosis not present

## 2015-12-01 DIAGNOSIS — N200 Calculus of kidney: Secondary | ICD-10-CM | POA: Diagnosis not present

## 2015-12-01 DIAGNOSIS — N390 Urinary tract infection, site not specified: Secondary | ICD-10-CM

## 2015-12-01 DIAGNOSIS — M545 Low back pain: Secondary | ICD-10-CM | POA: Diagnosis not present

## 2015-12-01 DIAGNOSIS — R911 Solitary pulmonary nodule: Secondary | ICD-10-CM

## 2015-12-01 LAB — POCT URINALYSIS DIPSTICK
BILIRUBIN UA: NEGATIVE
Blood, UA: NEGATIVE
GLUCOSE UA: NEGATIVE
NITRITE UA: POSITIVE
Spec Grav, UA: 1.02
Urobilinogen, UA: 0.2
pH, UA: 5

## 2015-12-01 MED ORDER — NAPROXEN 500 MG PO TABS: 500.0000 mg | ORAL_TABLET | Freq: Two times a day (BID) | ORAL | 0 refills | Status: DC

## 2015-12-01 MED ORDER — BACLOFEN 10 MG PO TABS: 5.0000 mg | ORAL_TABLET | Freq: Three times a day (TID) | ORAL | 0 refills | Status: DC | PRN

## 2015-12-01 MED ORDER — ONDANSETRON 4 MG PO TBDP: 4.0000 mg | ORAL_TABLET | Freq: Three times a day (TID) | ORAL | 0 refills | Status: DC | PRN

## 2015-12-01 MED ORDER — HYDROCODONE-ACETAMINOPHEN 5-325 MG PO TABS: 1.0000 | ORAL_TABLET | ORAL | 0 refills | Status: DC | PRN

## 2015-12-01 NOTE — Patient Instructions (Addendum)
Thank you for coming in to clinic today.  1. Follow-up with Dr Sheri [name] as scheduled tomorrow November 2nd 2. I think your back pain is due to the large kidney stone, it may have changed positions, otherwise there could be other urinary complication from the stone 3. You may need another CT scan (this could check the pulmonary nodule as well), discuss with Dr Sheri [name] about imaging 4. Urine looks like possible UTI, but not consistent with symptoms, will send for culture - notify Urology, they may check this again 5. For symptoms - Try Zofran as needed for nausea - Recommend trial of Anti-inflammatory with Naproxen (Naprosyn) 500mg  tabs - take one with food and plenty of water TWICE daily every day (breakfast and dinner), for next 1 week, then you may take only as needed - DO NOT TAKE any ibuprofen, aleve, motrin while you are taking this medicine - It is safe to take Tylenol Ext Str 500mg  tabs - take 1 to 2 (max dose 1000mg ) every 6 hours as needed for breakthrough pain, max 24 hour daily dose is 6 to 8 tablets or 4000mg  - Continue heating pad and ice if helping  Start taking Baclofen (Lioresal) 10mg  (muscle relaxant) - start with half (cut) to one whole pill at night as needed for next 1-3 nights (may make you drowsy, caution with driving) see how it affects you, then if tolerated increase to one pill 2 to 3 times a day or (every 8 hours as needed)  Please schedule a follow-up appointment with Dr. Parks [name] in 1 month or sooner as needed for follow-up back pain / kidney stone, OR when feeling better can schedule annual physical, let us know if you want to schedule physical and we will order fasting blood work ahead of time (you can get labs at our clinic in the morning, and we can have results ready for your physical apt)  If you have any other questions or concerns, please feel free to call the clinic or send a message through [place]. You may also schedule an earlier appointment if  necessary.  Sheri Putnam, DO [place]

## 2015-12-01 NOTE — Assessment & Plan Note (Addendum)
Concern for recent history of large R 10mm nephrolithiasis identified on prior outside CT, despite recent cystoscopy without stone retrieval, but did have temporary stent placement. Suspect acute worsening R LBP may be related to renal colick if position change from stone, unsure why not seen previously, recent Renal US 10/27 unremarkable without evidence of hydro is reassuring, unlikely obstruction. Clinically well appearing afebrile, no sign of pyelo or systemic infection symptoms (however unclear about cystitis with nitrite positive, not clinically consistent), and hemodynamically stable today.  Plan: 1. Treat symptomatically short term with trial on muscle relaxant for back (possible spasms), Norco for PRN renal colick, Zofran PRN, also may stop Ibuprofen and switch to Naproxen 500 BID for 1 week given possible MSK LBP 2. Additionally, added urine culture today given nitrite positive - hold antibiotics given not clinically consistent 3. Follow-up tomorrow with Urology, review [place] results and discuss potential imaging with repeat CT or next steps, given concern for still present nephrolithiasis - Reviewed return criteria when to go to ED, follow-up within 1 month as needed

## 2015-12-01 NOTE — Progress Notes (Signed)
Subjective:    Patient ID: Sheri [name], female    DOB: 03/16/1965, 50 y.o.   MRN: [id number]  Sheri [name] is a 50 y.o. female presenting on 12/01/2015 for No chief complaint on file.  Patient presents for a same day appointment.  HPI   RIGHT NEPHROLITHIASIS, Follow-up / ACUTE RIGHT LOW BACK PAIN / H/o UTIs - Currently reports symptoms of Right lower back pain for past 3 weeks, gradually worsening, described as constant moderate ache in same spot present every day, but does have significant intermittent flare with sharp severe pain up to 10/10 lasting up to 30 min usually resolves with position change. - Overall not feeling well for past few weeks - Significant history of kidney stones chronically, with recent complex course, followed by [facility] (Dr Erlene Quan), prior to September 2017, she had occasional small kidney stones only without significant problems, however onset of recent acute worsening starting on 10/04/15 while away at [facility] for summer trip, she reviews prior history describing initially she woke up overnight with fever/chills, and overall felt bad, she had old prior antibiotic from dental work, tried antibiotic, some improvement, then symptoms changed to having R sided pains, nausea, vomiting, thought similar to prior kidney stone. She dealt with these symptoms for few weeks, she was acutely treated at out of town ED, identified 20mm R kidney stone on CT scan, treated with flomax and pain medicine, also treated with Keflex, she returned home followed up with Urology, proceeded to have Cystoscopy but unable to locate and retreive nephrolithiasis, urinary stent was placed, subsequently removed on 10/4 - She had a Renal US on 10/27, with unremarkable results without hydro - Scheduled to follow-up next with Urology Dr Erlene Quan tomorrow 11/2 - Taking Ibuprofen 200mg  x 3 daily, without repeat dosing. She is cautious with history of MI - Admits urge  incontinence worse over past 2 months - Admits nausea, without vomiting (usually associated with sharp LBP) - Denies any fevers/chills, dysuria, urinary pressure, hematuria, urinary odor, urinary frequency, headache or lightheadedness   Social History  Substance Use Topics  . Smoking status: Former Research scientist (life sciences)  . Smokeless tobacco: Never Used     Comment: quit 20 years  . Alcohol use No    Review of Systems Per HPI unless specifically indicated above     Objective:    BP 128/82 (BP Location: Right Arm, Patient Position: Sitting, Cuff Size: Large)   Pulse 66   Temp 98.2 F (36.8 C) (Oral)   Resp 16   Ht 5\' 4"  (1.626 m)   Wt 239 lb (108.4 kg)   BMI 41.02 kg/m   Wt Readings from Last 3 Encounters:  12/01/15 239 lb (108.4 kg)  11/01/15 245 lb (111.1 kg)  10/21/15 245 lb (111.1 kg)    Physical Exam  Constitutional: She is oriented to person, place, and time. She appears well-developed and well-nourished. No distress.  Well-appearing, mild-moderate discomfort with low back pain, some difficulty finding comfortable position, cooperative  HENT:  Head: Normocephalic and atraumatic.  Mouth/Throat: Oropharynx is clear and moist.  Neck: Neck supple.  Cardiovascular: Normal rate, regular rhythm, normal heart sounds and intact distal pulses.   No murmur heard. Pulmonary/Chest: Effort normal and breath sounds normal. No respiratory distress. She has no wheezes. She has no rales.  Abdominal: Soft. Bowel sounds are normal. She exhibits no mass.  Mild discomfort in RUQ to palpation. No significant abdominal distention. Large body habitus. No significant RLQ tenderness acutely No  rebound tenderness  Musculoskeletal: She exhibits no edema.  Low Back Inspection: Normal appearance, Large body habitus, no spinal deformity, symmetrical. Palpation: No tenderness over spinous processes. Very mild bilateral lower paraspinal muscle hypertonicity, non-tender ROM: Full active ROM forward flex / back  extension, rotation L/R without discomfort  Strength: Bilateral hip flex/ext 5/5, knee flex/ext 5/5, ankle dorsiflex/plantarflex 5/5 Neurovascular: intact distal sensation to light touch  Neurological: She is alert and oriented to person, place, and time.  Skin: Skin is warm and dry. She is not diaphoretic.  Nursing note and vitals reviewed.    Results for orders placed or performed in visit on 12/01/15  POCT Urinalysis Dipstick  Result Value Ref Range   Color, UA amber    Clarity, UA cloudy    Glucose, UA neg    Bilirubin, UA neg    Ketones, UA meg    Spec Grav, UA 1.020    Blood, UA neg    pH, UA 5.0    Protein, UA trace    Urobilinogen, UA 0.2    Nitrite, UA positive    Leukocytes, UA Trace (A) Negative       I have personally reviewed the radiology report from Renal US on 11/26/15.  EXAM: RENAL / URINARY TRACT ULTRASOUND COMPLETE COMPARISON:  CT 12/09/2014 FINDINGS: Right Kidney: Length: 11.9 cm. Echogenicity within normal limits. No mass or hydronephrosis visualized. Left Kidney: Length: 11.6 cm. Echogenicity within normal limits. No mass or hydronephrosis visualized. Bladder: Appears normal for degree of bladder distention. IMPRESSION: Unremarkable renal ultrasound.  Assessment & Plan:   Problem List Items Addressed This Visit    Right nephrolithiasis - Primary    Concern for recent history of large R 60mm nephrolithiasis identified on prior outside CT, despite recent cystoscopy without stone retrieval, but did have temporary stent placement. Suspect acute worsening R LBP may be related to renal colick if position change from stone, unsure why not seen previously, recent Renal US 10/27 unremarkable without evidence of hydro is reassuring, unlikely obstruction. Clinically well appearing afebrile, no sign of pyelo or systemic infection symptoms (however unclear about cystitis with nitrite positive, not clinically consistent), and hemodynamically stable  today.  Plan: 1. Treat symptomatically short term with trial on muscle relaxant for back (possible spasms), Norco for PRN renal colick, Zofran PRN, also may stop Ibuprofen and switch to Naproxen 500 BID for 1 week given possible MSK LBP 2. Additionally, added urine culture today given nitrite positive - hold antibiotics given not clinically consistent 3. Follow-up tomorrow with Urology, review [place] results and discuss potential imaging with repeat CT or next steps, given concern for still present nephrolithiasis - Reviewed return criteria when to go to ED, follow-up within 1 month as needed      Relevant Medications   HYDROcodone-acetaminophen (NORCO/VICODIN) 5-325 MG tablet   ondansetron (ZOFRAN-ODT) 4 MG disintegrating tablet   Other Relevant Orders   Urine Culture   Pulmonary nodule    Small probable 3 mm R lung base nodule, incidental finding on last Renal Stone CT abdomen/pelvis, similarly seen on prior chest CT 05/2014. - Given low risk patient, non smoker, less requirement on follow-up CT, however if needs repeat CT Abd for renal study, this could evaluate nodule       Other Visit Diagnoses    Frequent UTI       Check urine culture, given positive UA today. Hold empiric antibiotics, will follow-up with Urology tomorrow   Relevant Orders   POCT Urinalysis Dipstick (Completed)  Low back pain, unspecified back pain laterality, unspecified chronicity, with sciatica presence unspecified       Suspected related to R nephrolithiasis identified on outside CT, reassuring with no hydro on renal US. Possible MSK spasms, treat muscle relax, NSAID   Relevant Medications   naproxen (NAPROSYN) 500 MG tablet   HYDROcodone-acetaminophen (NORCO/VICODIN) 5-325 MG tablet   baclofen (LIORESAL) 10 MG tablet   Other Relevant Orders   POCT Urinalysis Dipstick (Completed)   Urine Culture   Nausea without vomiting       Relevant Medications   ondansetron (ZOFRAN-ODT) 4 MG disintegrating tablet    Other Relevant Orders   Urine Culture      Meds ordered this encounter  Medications  . naproxen (NAPROSYN) 500 MG tablet    Sig: Take 1 tablet (500 mg total) by mouth 2 (two) times daily with a meal. For 1 week, then as needed    Dispense:  30 tablet    Refill:  0  . HYDROcodone-acetaminophen (NORCO/VICODIN) 5-325 MG tablet    Sig: Take 1 tablet by mouth every 4 (four) hours as needed for moderate pain.    Dispense:  10 tablet    Refill:  0  . ondansetron (ZOFRAN-ODT) 4 MG disintegrating tablet    Sig: Take 1 tablet (4 mg total) by mouth every 8 (eight) hours as needed for nausea or vomiting.    Dispense:  20 tablet    Refill:  0  . baclofen (LIORESAL) 10 MG tablet    Sig: Take 0.5-1 tablets (5-10 mg total) by mouth 3 (three) times daily as needed for muscle spasms.    Dispense:  30 each    Refill:  0      Follow up plan: Return in about 4 weeks (around 12/29/2015), or if symptoms worsen or fail to improve, for back pain, kidney stone.  [name], [facility] 12/01/2015, 2:52 PM

## 2015-12-01 NOTE — Assessment & Plan Note (Signed)
Small probable 3 mm R lung base nodule, incidental finding on last Renal Stone CT abdomen/pelvis, similarly seen on prior chest CT 05/2014. - Given low risk patient, non smoker, less requirement on follow-up CT, however if needs repeat CT Abd for renal study, this could evaluate nodule

## 2015-12-02 ENCOUNTER — Telehealth: Payer: Self-pay | Admitting: Family Medicine

## 2015-12-02 DIAGNOSIS — N3000 Acute cystitis without hematuria: Secondary | ICD-10-CM

## 2015-12-02 MED ORDER — CEPHALEXIN 500 MG PO CAPS: 500.0000 mg | ORAL_CAPSULE | Freq: Three times a day (TID) | ORAL | 0 refills | Status: DC

## 2015-12-02 NOTE — Telephone Encounter (Signed)
Pt's uro appt isn't until 11/7 so she asked if the antibiotic could be called in to [facility] in [place].  Her call back number is [phone]

## 2015-12-02 NOTE — Telephone Encounter (Signed)
Last visit 11/1, see note for details. UA checked with +nitrites, trace leuks, protein, no RBC, suggestive of UTI, not entirely clinically consistent, however given acute concern with R nephrolithiasis and prior complicated urology history, will proceed with empiric treatment for now, while awaiting Urine Culture results, start Keflex 500mg  TID for 7 days, sent to pharmacy. Follow-up urine culture, patient to see Urology 12/07/15.  Patient states she has tolerated Keflex before, last in 08/2015.  [name], [facility] 12/02/2015, 12:05 PM

## 2015-12-03 LAB — URINE CULTURE

## 2015-12-03 LAB — PLEASE NOTE

## 2015-12-03 MED ORDER — SULFAMETHOXAZOLE-TRIMETHOPRIM 800-160 MG PO TABS
1.0000 | ORAL_TABLET | Freq: Two times a day (BID) | ORAL | 0 refills | Status: DC
Start: 2015-12-03 — End: 2015-12-06

## 2015-12-03 NOTE — Telephone Encounter (Signed)
UPDATE 12/03/15 urine culture results with E Coli UTI >100k CFU, extensive resistances, including cefazolin resistance, limited options now, patient is allergic to PCN. Sensitive to Bactrim. Called patient, reached husband, and he agrees to share message with wife, that Urine Culture shows keflex resistance, stop current keflex, switch to new rx Bactrim-DS 1 pill twice a day for 3 days, sent to pharmacy. Follow-up as planned.  [name], DO [facility] 12/03/2015, 5:07 PM

## 2015-12-03 NOTE — Addendum Note (Signed)
Addended by: Olin Hauser on: 12/03/2015 05:07 PM   Modules accepted: Orders

## 2015-12-06 ENCOUNTER — Ambulatory Visit: Payer: BLUE CROSS/BLUE SHIELD | Admitting: Urology

## 2015-12-06 ENCOUNTER — Telehealth: Payer: Self-pay | Admitting: Family Medicine

## 2015-12-06 DIAGNOSIS — N3000 Acute cystitis without hematuria: Secondary | ICD-10-CM

## 2015-12-06 MED ORDER — SULFAMETHOXAZOLE-TRIMETHOPRIM 800-160 MG PO TABS: 1.0000 | ORAL_TABLET | Freq: Two times a day (BID) | ORAL | 0 refills | Status: DC

## 2015-12-06 NOTE — Telephone Encounter (Signed)
Pt is out of antibiotics and wanted to know if she could get another refill.  Her call back number is [phone]

## 2015-12-06 NOTE — Telephone Encounter (Signed)
See prior note for details, treated UTI recently with Bactrim-DS 1 tab BID for 3 days, starting on 12/03/15. She is scheduled to see Urologist tomorrow on 12/07/15. The antibiotic course was changed initially from Keflex to Bactrim due to resistance. The course was only for 3 days for UTI. She does not need refill on antibiotics, I am interested to know if she is feeling better after 3 days, and what current symptoms are, however regardless she should follow-up with Urology on 12/07/15 for further management of this problem.  Sheri [name], [facility] 12/06/2015, 3:00 PM

## 2015-12-06 NOTE — Telephone Encounter (Signed)
rx for 3 additional pills have been sent to pharmacy.[place]

## 2015-12-07 ENCOUNTER — Encounter: Payer: Self-pay | Admitting: Urology

## 2015-12-07 ENCOUNTER — Ambulatory Visit: Payer: BLUE CROSS/BLUE SHIELD | Admitting: Urology

## 2015-12-07 VITALS — BP 121/79 | HR 67 | Ht 64.0 in | Wt 244.6 lb

## 2015-12-07 DIAGNOSIS — R10A1 Flank pain, right side: Secondary | ICD-10-CM

## 2015-12-07 DIAGNOSIS — R911 Solitary pulmonary nodule: Secondary | ICD-10-CM | POA: Diagnosis not present

## 2015-12-07 DIAGNOSIS — R112 Nausea with vomiting, unspecified: Secondary | ICD-10-CM | POA: Diagnosis not present

## 2015-12-07 DIAGNOSIS — R3129 Other microscopic hematuria: Secondary | ICD-10-CM

## 2015-12-07 DIAGNOSIS — Z87442 Personal history of urinary calculi: Secondary | ICD-10-CM

## 2015-12-07 DIAGNOSIS — R109 Unspecified abdominal pain: Secondary | ICD-10-CM | POA: Diagnosis not present

## 2015-12-07 DIAGNOSIS — N1330 Unspecified hydronephrosis: Secondary | ICD-10-CM | POA: Diagnosis not present

## 2015-12-07 DIAGNOSIS — N3001 Acute cystitis with hematuria: Secondary | ICD-10-CM | POA: Diagnosis not present

## 2015-12-07 LAB — URINALYSIS, COMPLETE
Bilirubin, UA: NEGATIVE
Glucose, UA: NEGATIVE
KETONES UA: NEGATIVE
NITRITE UA: NEGATIVE
PH UA: 5.5 (ref 5.0–7.5)
Protein, UA: NEGATIVE
Urobilinogen, Ur: 0.2 mg/dL (ref 0.2–1.0)

## 2015-12-07 LAB — MICROSCOPIC EXAMINATION: Epithelial Cells (non renal): >10 /hpf — AB (ref 0–10)

## 2015-12-07 MED ORDER — ONDANSETRON HCL 4 MG PO TABS: 4.0000 mg | ORAL_TABLET | Freq: Three times a day (TID) | ORAL | 0 refills | Status: DC | PRN

## 2015-12-07 MED ORDER — SULFAMETHOXAZOLE-TRIMETHOPRIM 800-160 MG PO TABS: 1.0000 | ORAL_TABLET | Freq: Two times a day (BID) | ORAL | 0 refills | Status: DC

## 2015-12-07 NOTE — Progress Notes (Signed)
12/07/2015 8:54 AM   Sheri [name] 1965/03/29 [id number]  Referring provider: Luciana Axe, NP No address on file  Chief Complaint  Patient presents with  . Follow-up    1 month after cystoscopy with stent placement R ureteral stone    HPI: Patient is a 50 year old Caucasian female who presents today for her RUS report after undergoing bilateral retrogrades, ureteral stent placement and self stent removal.    Background history Patient is a 66 was referred by Juanda [facility] for nephrolithiasis.  CT scan noted a 12 mm calcification extending into the bladder at the right UVJ and a 2 mm calcification in the distal right ureter at the level of the acetabulum that was approximately 2 mm in size.  Patient brought and a disc for our review and I have personally reviewed the films.  She was seen last year in our office for gross hematuria.  She was scheduled for a CT urogram, but she was seen in the emergency room a few weeks after her visit. A CT renal stone study at that time noted a left UVJ stone. She states she did pass that stone.  She underwent bilateral retrogrades, cystoscopy and a ureteral stent placement and no stone was seen on 11/01/2015.   2 weeks ago, she started experiencing right-sided flank pain. She states it does not radiate. She states the pain initially started it was very sharp and intense causing nausea and vomiting. She states over the last few days has just been a dull aching pain.  She states the pain feels internal and not muscle skeletal in origin.  She states the pain is independent of movement or position.  She is not had gross hematuria.  She had recently seen her PCP and was found to have a urinary tract infection positive for Escherichia coli.  She has only taken Bactrim once daily for 6 days for this infection.  Her UA today continues to be suspicious for infection with greater than 30 WBCs and 3-10 RBCs per high-power field. Her RUS was  unremarkable from 11/26/2015.  I have independently reviewed the films.  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  She is currently on anticoagulation therapy.    PMH: Past Medical History:  Diagnosis Date  . Heart attack 03/2014  . Heart disease   . History of C-section   . HLD (hyperlipidemia)   . Hx of heart artery stent   . Hypertension     Surgical History: Past Surgical History:  Procedure Laterality Date  . CARDIAC SURGERY    . CESAREAN SECTION    . CHOLECYSTECTOMY    . CYSTOSCOPY WITH STENT PLACEMENT Left 11/01/2015   Procedure: CYSTOSCOPY WITH STENT PLACEMENT;  Surgeon: Hollice Espy, MD;  Location: [facility] ORS;  Service: Urology;  Laterality: Left;  . CYSTOSCOPY/RETROGRADE/URETEROSCOPY Bilateral 11/01/2015   Procedure: CYSTOSCOPY/RETROGRADE/URETEROSCOPY;  Surgeon: Hollice Espy, MD;  Location: [facility] ORS;  Service: Urology;  Laterality: Bilateral;  . LEFT HEART CATHETERIZATION WITH CORONARY ANGIOGRAM N/A 04/06/2014   Procedure: LEFT HEART CATHETERIZATION WITH CORONARY ANGIOGRAM;  Surgeon: Jettie Booze, MD;  Location: [facility] CATH LAB;  Service: Cardiovascular;  Laterality: N/A;    Home Medications:    Medication List       Accurate as of 12/07/15  8:54 AM. Always use your most recent med list.          ALIGN 4 MG  Caps Take 4 mg by mouth 2 (two) times daily.   aspirin 81 MG chewable tablet Chew 1 tablet (81 mg total) by mouth daily.   baclofen 10 MG tablet Commonly known as:  LIORESAL Take 0.5-1 tablets (5-10 mg total) by mouth 3 (three) times daily as needed for muscle spasms.   clopidogrel 75 MG tablet Commonly known as:  PLAVIX TAKE 1 TABLET (75 MG TOTAL) BY MOUTH DAILY.   escitalopram 10 MG tablet Commonly known as:  LEXAPRO Take 1 tablet (10 mg total) by mouth daily.   HYDROcodone-acetaminophen 5-325 MG tablet Commonly known as:  NORCO/VICODIN Take 1 tablet by mouth  every 4 (four) hours as needed for moderate pain.   isosorbide mononitrate 30 MG 24 hr tablet Commonly known as:  IMDUR TAKE 1 TABLET (30 MG TOTAL) BY MOUTH DAILY.   lisinopril 5 MG tablet Commonly known as:  PRINIVIL,ZESTRIL TAKE 1 TABLET EVERY DAY   metoprolol tartrate 25 MG tablet Commonly known as:  LOPRESSOR Take 1 tablet (25 mg total) by mouth 2 (two) times daily.   naproxen 500 MG tablet Commonly known as:  NAPROSYN Take 1 tablet (500 mg total) by mouth 2 (two) times daily with a meal. For 1 week, then as needed   nitroGLYCERIN 0.4 MG SL tablet Commonly known as:  NITROSTAT Place 1 tablet (0.4 mg total) under the tongue every 5 (five) minutes x 3 doses as needed for chest pain.   ondansetron 4 MG disintegrating tablet Commonly known as:  ZOFRAN-ODT Take 1 tablet (4 mg total) by mouth every 8 (eight) hours as needed for nausea or vomiting.   rosuvastatin 10 MG tablet Commonly known as:  CRESTOR Take 1 tablet (10 mg total) by mouth daily.   sulfamethoxazole-trimethoprim 800-160 MG tablet Commonly known as:  BACTRIM DS,SEPTRA DS Take 1 tablet by mouth every 12 (twelve) hours.       Allergies:  Allergies  Allergen Reactions  . Latex Dermatitis  . Penicillins Rash    Family History: Family History  Problem Relation Age of Onset  . Heart attack Father 63    died of MI at age 92  . Hypertension Mother   . Hyperlipidemia Mother   . Kidney Stones Sister   . Kidney Stones Brother   . Stroke Paternal Grandfather   . Liver cancer Maternal Grandmother   . Kidney disease Neg Hx   . Bladder Cancer Neg Hx     Social History:  reports that she has quit smoking. She has never used smokeless tobacco. She reports that she does not drink alcohol or use drugs.  ROS: UROLOGY Frequent Urination?: Yes Hard to postpone urination?: Yes Burning/pain with urination?: No Get up at night to urinate?: Yes Leakage of urine?: Yes Urine stream starts and stops?: No Trouble  starting stream?: No Do you have to strain to urinate?: No Blood in urine?: No Urinary tract infection?: No Sexually transmitted disease?: No Injury to kidneys or bladder?: No Painful intercourse?: No Weak stream?: No Currently pregnant?: No Vaginal bleeding?: No Last menstrual period?: n  Gastrointestinal Nausea?: Yes Vomiting?: Yes Indigestion/heartburn?: No Diarrhea?: No Constipation?: No  Constitutional Fever: No Night sweats?: No Weight loss?: No Fatigue?: Yes  Skin Skin rash/lesions?: No Itching?: No  Eyes Blurred vision?: No Double vision?: No  Ears/Nose/Throat Sore throat?: No Sinus problems?: No  Hematologic/Lymphatic Swollen glands?: No Easy bruising?: Yes  Cardiovascular Leg swelling?: No Chest pain?: No  Respiratory Cough?: No Shortness of breath?: Yes  Endocrine Excessive thirst?:  No  Musculoskeletal Back pain?: Yes Joint pain?: No  Neurological Headaches?: No Dizziness?: No  Psychologic Depression?: No Anxiety?: No  Physical Exam: BP 121/79   Pulse 67   Ht 5\' 4"  (1.626 m)   Wt 244 lb 9.6 oz (110.9 kg)   LMP 10/15/2015 Comment: abalation done 5 years ago / perimenopausal  BMI 41.99 kg/m   Constitutional:  Alert and oriented, No acute distress. HEENT: [place] AT, moist mucus membranes.  Trachea midline, no masses. Cardiovascular: No clubbing, cyanosis, or edema. Respiratory: Normal respiratory effort, no increased work of breathing. GI: Abdomen is soft, nontender, nondistended, no abdominal masses GU: No CVA tenderness.  Skin: No rashes, bruises or suspicious lesions. Lymph: No cervical or inguinal adenopathy. Neurologic: Grossly intact, no focal deficits, moving all 4 extremities. Psychiatric: Normal mood and affect.  Laboratory Data:  Results for orders placed or performed in visit on 12/01/15  Urine Culture  Result Value Ref Range   Urine Culture, Routine Final report (A)    Urine Culture result 1 Escherichia coli (A)     RESULT 2 Comment    ANTIMICROBIAL SUSCEPTIBILITY Comment   Please Note  Result Value Ref Range   Please note Comment   POCT Urinalysis Dipstick  Result Value Ref Range   Color, UA amber    Clarity, UA cloudy    Glucose, UA neg    Bilirubin, UA neg    Ketones, UA meg    Spec Grav, UA 1.020    Blood, UA neg    pH, UA 5.0    Protein, UA trace    Urobilinogen, UA 0.2    Nitrite, UA positive    Leukocytes, UA Trace (A) Negative   Today's UA with >30 WBC's and 3-10 RBC's hpf.  See EPC.   Pertinent Imaging CLINICAL DATA:  History of nephrolithiasis. Bilateral back pain for 3 weeks  EXAM: RENAL / URINARY TRACT ULTRASOUND COMPLETE  COMPARISON:  CT 12/09/2014  FINDINGS: Right Kidney:  Length: 11.9 cm. Echogenicity within normal limits. No mass or hydronephrosis visualized.  Left Kidney:  Length: 11.6 cm. Echogenicity within normal limits. No mass or hydronephrosis visualized.  Bladder:  Appears normal for degree of bladder distention.  IMPRESSION: Unremarkable renal ultrasound.   Electronically Signed   By: Rolm Baptise M.D.   On: 11/26/2015 15:53  Assessment & Plan:    1. Right flank pain  - patient continues with right flank pain  - no stone seen during RTG's  - will obtain a non contrast CT of the abdomen and pelvis for further evaluation  - Advised to contact our office or seek treatment in the ED if becomes febrile or pain/ vomiting are difficult control in order to arrange for emergent/urgent intervention   2. Right hydronephrosis  - resolved  3. Microscopic hematuria  - We will continue to monitor the patient's UA after the treatment/passage of the stone to ensure the hematuria has resolved.  If hematuria persists, we will pursue a hematuria workup with CT Urogram and cystoscopy if appropriate.  4. UTI  - patient given script for seven days of Bactrim DS   5. Nausea and vomiting  - script given for Zofran  6. Pulmonary nodule  -  incidental finding on 2016 CT  - added chest CT as requested by PCP  7. History of nephrolithiasis  - consider 24 urine in the future  Return for CT reports.  Zara Council, [facility] [address], [place], [place]  [address] [phone]    

## 2015-12-20 ENCOUNTER — Ambulatory Visit
Admission: RE | Admit: 2015-12-20 | Discharge: 2015-12-20 | Disposition: A | Discharge status: Home / Self Care | Payer: BLUE CROSS/BLUE SHIELD | Source: Ambulatory Visit | Attending: Urology | Admitting: Urology

## 2015-12-20 DIAGNOSIS — M5137 Other intervertebral disc degeneration, lumbosacral region: Secondary | ICD-10-CM | POA: Insufficient documentation

## 2015-12-20 DIAGNOSIS — I700 Atherosclerosis of aorta: Secondary | ICD-10-CM | POA: Diagnosis not present

## 2015-12-20 DIAGNOSIS — R911 Solitary pulmonary nodule: Secondary | ICD-10-CM | POA: Insufficient documentation

## 2015-12-20 DIAGNOSIS — R109 Unspecified abdominal pain: Secondary | ICD-10-CM | POA: Diagnosis not present

## 2015-12-20 DIAGNOSIS — I779 Disorder of arteries and arterioles, unspecified: Secondary | ICD-10-CM | POA: Diagnosis not present

## 2015-12-20 DIAGNOSIS — M47814 Spondylosis without myelopathy or radiculopathy, thoracic region: Secondary | ICD-10-CM | POA: Insufficient documentation

## 2015-12-20 DIAGNOSIS — N200 Calculus of kidney: Secondary | ICD-10-CM | POA: Insufficient documentation

## 2015-12-20 DIAGNOSIS — J984 Other disorders of lung: Secondary | ICD-10-CM | POA: Diagnosis not present

## 2015-12-21 ENCOUNTER — Telehealth: Payer: Self-pay | Admitting: Urology

## 2015-12-21 ENCOUNTER — Encounter: Payer: Self-pay | Admitting: Urology

## 2015-12-21 ENCOUNTER — Ambulatory Visit: Payer: BLUE CROSS/BLUE SHIELD | Admitting: Urology

## 2015-12-21 VITALS — BP 111/72 | HR 65 | Ht 64.0 in | Wt 245.2 lb

## 2015-12-21 DIAGNOSIS — R109 Unspecified abdominal pain: Secondary | ICD-10-CM

## 2015-12-21 DIAGNOSIS — N3001 Acute cystitis with hematuria: Secondary | ICD-10-CM

## 2015-12-21 DIAGNOSIS — R911 Solitary pulmonary nodule: Secondary | ICD-10-CM | POA: Diagnosis not present

## 2015-12-21 DIAGNOSIS — N132 Hydronephrosis with renal and ureteral calculous obstruction: Secondary | ICD-10-CM

## 2015-12-21 DIAGNOSIS — R3129 Other microscopic hematuria: Secondary | ICD-10-CM

## 2015-12-21 DIAGNOSIS — H524 Presbyopia: Secondary | ICD-10-CM | POA: Diagnosis not present

## 2015-12-21 DIAGNOSIS — R110 Nausea: Secondary | ICD-10-CM | POA: Diagnosis not present

## 2015-12-21 DIAGNOSIS — Z87442 Personal history of urinary calculi: Secondary | ICD-10-CM

## 2015-12-21 LAB — URINALYSIS, COMPLETE
BILIRUBIN UA: NEGATIVE
GLUCOSE, UA: NEGATIVE
Ketones, UA: NEGATIVE
NITRITE UA: NEGATIVE
RBC, UA: NEGATIVE
Specific Gravity, UA: >1.03 — ABNORMAL HIGH (ref 1.005–1.030)
UUROB: 0.2 mg/dL (ref 0.2–1.0)
pH, UA: 5.5 (ref 5.0–7.5)

## 2015-12-21 LAB — MICROSCOPIC EXAMINATION: Epithelial Cells (non renal): >10 /hpf — AB (ref 0–10)

## 2015-12-21 NOTE — Telephone Encounter (Signed)
Orders faxed

## 2015-12-21 NOTE — Progress Notes (Signed)
12/21/2015 9:36 AM   Sheri [name] 1966/01/13 [id number]  Referring provider: Olin Hauser, DO [address] [place], [place] [address]  Chief Complaint  Patient presents with  . Results    CT    HPI: Patient is a 50 year old Caucasian female who presents today for discussion of her CT scan results performed to further evaluate her right-sided flank pain.   her RUS report after undergoing bilateral retrogrades, ureteral stent placement and self stent removal.    Background history Patient is a 12 was referred by Juanda Crumble [facility] for nephrolithiasis.  CT scan noted a 12 mm calcification extending into the bladder at the right UVJ and a 2 mm calcification in the distal right ureter at the level of the acetabulum that was approximately 2 mm in size.  Patient brought and a disc for our review and I have personally reviewed the films.  She was seen last year in our office for gross hematuria.  She was scheduled for a CT urogram, but she was seen in the emergency room a few weeks after her visit. A CT renal stone study at that time noted a left UVJ stone. She states she did pass that stone.  She underwent bilateral retrogrades, cystoscopy and a ureteral stent placement and no stone was seen on 11/01/2015.   She removed her stent herself.  2 weeks ago after the procedure, she started experiencing right-sided flank pain. She states it does not radiate. She states the pain initially started it was very sharp and intense causing nausea and vomiting. She states over the last few days has just been a dull aching pain.  She states the pain feels internal and not muscle skeletal in origin.  She states the pain is independent of movement or position.  She is not had gross hematuria.    She had recently seen her PCP and was found to have a urinary tract infection positive for Escherichia coli.  She has only taken Bactrim once daily for 6 days for this infection.  Her UA today  continue to be suspicious for infection with greater than 30 WBCs and 3-10 RBCs per high-power field.  Urine ulcer was positive for Escherichia coli and she was continued on Septra DS twice daily for 7 days. Her RUS was unremarkable from 11/26/2015.   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  She is currently on anticoagulation therapy.    Her CT scan of the chest, abdomen and pelvis without contrast was performed on 12/20/2015 it noted I do not see a compelling cause for right flank pain. The appendix appears normal.  2. 2 mm left kidney lower pole nonobstructive calculus.  Average size 4 mm right lower lobe pulmonary nodule, no change over the past 19 months, considered benign.  Right coronary artery density could be from atherosclerotic calcification or stent.  Thoracic spondylosis with spondylosis and degenerative disc disease at L5-S1, without observed impingement.  Aortoiliac atherosclerotic vascular disease.  I have independently reviewed her films.    Today, she is not longer having right flank pain.  It seemed to have resolved with antibiotics.  She has not had any fevers, chills or vomiting.  She is still having nausea which she is controlling with peppermint oil.  She has not gross hematuria, dysuria or suprapubic pain.  Her UA was unremarkable.    PMH: Past Medical History:  Diagnosis  Date  . Heart attack 03/2014  . Heart disease   . History of C-section   . HLD (hyperlipidemia)   . Hx of heart artery stent   . Hypertension     Surgical History: Past Surgical History:  Procedure Laterality Date  . CARDIAC SURGERY    . CESAREAN SECTION    . CHOLECYSTECTOMY    . CYSTOSCOPY WITH STENT PLACEMENT Left 11/01/2015   Procedure: CYSTOSCOPY WITH STENT PLACEMENT;  Surgeon: Hollice Espy, MD;  Location: [facility] ORS;  Service: Urology;  Laterality: Left;  . CYSTOSCOPY/RETROGRADE/URETEROSCOPY Bilateral 11/01/2015     Procedure: CYSTOSCOPY/RETROGRADE/URETEROSCOPY;  Surgeon: Hollice Espy, MD;  Location: [facility] ORS;  Service: Urology;  Laterality: Bilateral;  . LEFT HEART CATHETERIZATION WITH CORONARY ANGIOGRAM N/A 04/06/2014   Procedure: LEFT HEART CATHETERIZATION WITH CORONARY ANGIOGRAM;  Surgeon: Jettie Booze, MD;  Location: [facility] CATH LAB;  Service: Cardiovascular;  Laterality: N/A;    Home Medications:    Medication List       Accurate as of 12/21/15  9:36 AM. Always use your most recent med list.          ALIGN 4 MG Caps Take 4 mg by mouth 2 (two) times daily.   aspirin 81 MG chewable tablet Chew 1 tablet (81 mg total) by mouth daily.   baclofen 10 MG tablet Commonly known as:  LIORESAL Take 0.5-1 tablets (5-10 mg total) by mouth 3 (three) times daily as needed for muscle spasms.   clopidogrel 75 MG tablet Commonly known as:  PLAVIX TAKE 1 TABLET (75 MG TOTAL) BY MOUTH DAILY.   escitalopram 10 MG tablet Commonly known as:  LEXAPRO Take 1 tablet (10 mg total) by mouth daily.   HYDROcodone-acetaminophen 5-325 MG tablet Commonly known as:  NORCO/VICODIN Take 1 tablet by mouth every 4 (four) hours as needed for moderate pain.   isosorbide mononitrate 30 MG 24 hr tablet Commonly known as:  IMDUR TAKE 1 TABLET (30 MG TOTAL) BY MOUTH DAILY.   lisinopril 5 MG tablet Commonly known as:  PRINIVIL,ZESTRIL TAKE 1 TABLET EVERY DAY   metoprolol tartrate 25 MG tablet Commonly known as:  LOPRESSOR Take 1 tablet (25 mg total) by mouth 2 (two) times daily.   naproxen 500 MG tablet Commonly known as:  NAPROSYN Take 1 tablet (500 mg total) by mouth 2 (two) times daily with a meal. For 1 week, then as needed   nitroGLYCERIN 0.4 MG SL tablet Commonly known as:  NITROSTAT Place 1 tablet (0.4 mg total) under the tongue every 5 (five) minutes x 3 doses as needed for chest pain.   ondansetron 4 MG disintegrating tablet Commonly known as:  ZOFRAN-ODT Take 1 tablet (4 mg total) by mouth  every 8 (eight) hours as needed for nausea or vomiting.   ondansetron 4 MG tablet Commonly known as:  ZOFRAN Take 1 tablet (4 mg total) by mouth every 8 (eight) hours as needed for nausea or vomiting.   rosuvastatin 10 MG tablet Commonly known as:  CRESTOR Take 1 tablet (10 mg total) by mouth daily.   sulfamethoxazole-trimethoprim 800-160 MG tablet Commonly known as:  BACTRIM DS,SEPTRA DS Take 1 tablet by mouth every 12 (twelve) hours.       Allergies:  Allergies  Allergen Reactions  . Latex Dermatitis  . Penicillins Rash    Family History: Family History  Problem Relation Age of Onset  . Heart attack Father 39    died of MI at age 70  . Hypertension Mother   .  Hyperlipidemia Mother   . Kidney Stones Sister   . Kidney Stones Brother   . Stroke Paternal Grandfather   . Liver cancer Maternal Grandmother   . Kidney disease Neg Hx   . Bladder Cancer Neg Hx     Social History:  reports that she has quit smoking. She has never used smokeless tobacco. She reports that she does not drink alcohol or use drugs.  ROS: UROLOGY Frequent Urination?: No Hard to postpone urination?: No Burning/pain with urination?: No Get up at night to urinate?: No Leakage of urine?: No Urine stream starts and stops?: No Trouble starting stream?: No Do you have to strain to urinate?: No Blood in urine?: No Urinary tract infection?: No Sexually transmitted disease?: No Injury to kidneys or bladder?: No Painful intercourse?: No Weak stream?: No Currently pregnant?: No Vaginal bleeding?: No Last menstrual period?: n  Gastrointestinal Nausea?: Yes Vomiting?: No Indigestion/heartburn?: No Diarrhea?: No Constipation?: No  Constitutional Fever: No Night sweats?: No Weight loss?: No Fatigue?: No  Skin Skin rash/lesions?: No Itching?: No  Eyes Blurred vision?: No Double vision?: No  Ears/Nose/Throat Sore throat?: No Sinus problems?: No  Hematologic/Lymphatic Swollen  glands?: No Easy bruising?: No  Cardiovascular Leg swelling?: No Chest pain?: No  Respiratory Cough?: Yes Shortness of breath?: No  Endocrine Excessive thirst?: No  Musculoskeletal Back pain?: No Joint pain?: No  Neurological Headaches?: No Dizziness?: No  Psychologic Depression?: No Anxiety?: No  Physical Exam: BP 111/72   Pulse 65   Ht 5\' 4"  (1.626 m)   Wt 245 lb 3.2 oz (111.2 kg)   LMP 10/15/2015 Comment: abalation done 5 years ago / perimenopausal  BMI 42.09 kg/m   Constitutional:  Alert and oriented, No acute distress. HEENT: Startex AT, moist mucus membranes.  Trachea midline, no masses. Cardiovascular: No clubbing, cyanosis, or edema. Respiratory: Normal respiratory effort, no increased work of breathing. GI: Abdomen is soft, nontender, nondistended, no abdominal masses GU: No CVA tenderness.  Skin: No rashes, bruises or suspicious lesions. Lymph: No cervical or inguinal adenopathy. Neurologic: Grossly intact, no focal deficits, moving all 4 extremities. Psychiatric: Normal mood and affect.  Laboratory Data: Urinalysis Unremarkable.  See EPIC.    Pertinent Imaging CLINICAL DATA:  Right flank pain  EXAM: CT CHEST, ABDOMEN AND PELVIS WITHOUT CONTRAST  TECHNIQUE: Multidetector CT imaging of the chest, abdomen and pelvis was performed following the standard protocol without IV contrast.  COMPARISON:  Multiple exams, including 05/07/2014 and 12/09/2014 in addition to renal ultrasound from 12/27/2015  FINDINGS: CT CHEST FINDINGS  Cardiovascular: Density within the right coronary artery may be from atherosclerotic calcification or stent.  Mediastinum/Nodes: No adenopathy in the chest. No esophageal abnormality seen in the chest.  Lungs/Pleura: Mild biapical pleuroparenchymal scarring. 0.5 by 0.3 cm right lower lobe pulmonary nodule on image 68/5, no change from earliest available comparison of 05/07/2014.  Musculoskeletal: Mild thoracic  spondylosis.  CT ABDOMEN PELVIS FINDINGS  Hepatobiliary: Cholecystectomy.  Pancreas: Unremarkable  Spleen: Unremarkable  Adrenals/Urinary Tract: 2 mm left kidney lower pole nonobstructive calculus, image 68/602. No hydronephrosis or other urinary tract calculi identified. Adrenal glands normal.  Stomach/Bowel: Unremarkable.  Appendix normal.  Vascular/Lymphatic: Aortoiliac atherosclerotic vascular disease. No pathologic adenopathy identified.  Reproductive: Unremarkable  Other: No supplemental non-categorized findings.  Musculoskeletal: Lumbar spondylosis and degenerative disc disease at L5-S1 without significant impingement identified.  IMPRESSION: 1. I do not see a compelling cause for right flank pain. The appendix appears normal. 2. 2 mm left kidney lower pole nonobstructive calculus. 3.  Average size 4 mm right lower lobe pulmonary nodule, no change over the past 19 months, considered benign. 4. Right coronary artery density could be from atherosclerotic calcification or stent. 5. Thoracic spondylosis with spondylosis and degenerative disc disease at L5-S1, without observed impingement. 6.  Aortoiliac atherosclerotic vascular disease.   Electronically Signed   By: Van Clines M.D.   On: 12/20/2015 17:16  Assessment & Plan:    1. Right flank pain  - resolved  - CT scan did not find any abnormalities   2. Right hydronephrosis  - resolved  - confirmed on recent CT performed on 12/20/2015  3. Microscopic hematuria  - UA unremarkable at today's visit  4. UTI  - UA unremarkable at today's visit  5. Nausea  - still having nausea-advised to contact her cardiologist  6. Pulmonary nodule  - considered benign as there has been no change in 19 months  - patient given report to carry to her PCP  7. History of nephrolithiasis  - consider 24 urine in the future  Return for pending 24 urine results.  Zara Council, [facility] [address], [place], [place] [address] [phone]

## 2015-12-21 NOTE — Telephone Encounter (Signed)
Would you call [facility] and order a 24 urine for this patient?

## 2015-12-30 ENCOUNTER — Ambulatory Visit (INDEPENDENT_AMBULATORY_CARE_PROVIDER_SITE_OTHER): Payer: BLUE CROSS/BLUE SHIELD | Admitting: Family Medicine

## 2015-12-30 ENCOUNTER — Encounter: Payer: Self-pay | Admitting: Family Medicine

## 2015-12-30 VITALS — BP 106/86 | HR 74 | Temp 98.1°F | Resp 16 | Ht 64.0 in | Wt 245.0 lb

## 2015-12-30 DIAGNOSIS — N200 Calculus of kidney: Secondary | ICD-10-CM | POA: Diagnosis not present

## 2015-12-30 DIAGNOSIS — R911 Solitary pulmonary nodule: Secondary | ICD-10-CM | POA: Diagnosis not present

## 2015-12-30 DIAGNOSIS — G4762 Sleep related leg cramps: Secondary | ICD-10-CM | POA: Insufficient documentation

## 2015-12-30 NOTE — Assessment & Plan Note (Signed)
Suspected muscle spasms in calves due to variety of factors (most likely occasional poor hydration), not consistent with restless leg syndrome or other pathology - Last labs normal K  Plan: 1. Conservative trial first with improve water intake, increase K rich vegetables, start OTC mag supplement, take daily MVI 2. Follow-up as needed, if not improving, consider gabapentin trial at night, can check mag, or if more concern RLS can consider ropinerole but seems unlikely

## 2015-12-30 NOTE — Patient Instructions (Signed)
Thank you for coming in to clinic today.  1. Do not worry about the Right lower Pulmonary Nodule. It is average size about 31mm, which is less than 6 mm as the cut off for need to follow-up on it. You have already had follow-up CT imaging 19 months later, which the recommendations say re-check in 18-24 months only for the larger more concerning nodules. - If you wanted in 12 to 24 months we could re-check CT one last time, or perhaps you may have another scan for kidney stones at some point  2. Follow-up with urology on 24 hr urine study for stones  3. For leg cramps, most likely related to several factors including hydration (try to drink more water), start daily Multivitamin, try to eat more potassium rich foods (green leafy vegetables, bananas, other fruits/veg), and may take low dose magnesium supplement. - I don't think that this is restless leg syndrome, but let me know if symptoms change  Please schedule a follow-up appointment with Dr. Parks Ranger in next 3 months for Annual Physical (BRING outside lab results)  If you have any other questions or concerns, please feel free to call the clinic or send a message through [facility]. You may also schedule an earlier appointment if necessary.  [name], DO [place]

## 2015-12-30 NOTE — Assessment & Plan Note (Signed)
Currently seems resolved, not seen on last CT, pain resolved after UTI treatment Following with [facility] for future 24 hr urine to determine composition of stones

## 2015-12-30 NOTE — Assessment & Plan Note (Signed)
Stable, suspected benign small avg 4 mm R lower incidental pulm nodule, on last chest CT 12/21/15, unchanged compared to prior CTs to 05/2014 - Low risk for lung CA given non smoker, quit >20 years ago - Asymptomatic  Plan: 1. Reassurance today, given already adequate follow-up with stable on imaging over 19 months without change. Given size < 6 mm and no concerning features, recommendations satisfied currently, if were to be larger >6 mm then repeat 18-24 months, however this has already been accomplished as well 2. Follow-up in future on any other CTs Sheri [name] needs for other reasons, likely renal stone study, no need for dedicated pulm nodule imaging follow-up unless clinical change

## 2015-12-30 NOTE — Progress Notes (Signed)
Subjective:    Patient ID: Sheri [name], female    DOB: 03-31-1965, 50 y.o.   MRN: [id number]  Sheri [name] is a 50 y.o. female presenting on 12/30/2015 for Follow-up ( kidney stone but disappeared now concerned about RLS )  HPI   FOLLOW-UP PULMONOARY NODULE, Right lower lung - Last visit 12/01/15 discussed incidental finding of R lower pulmonary nodule 3-4 mm in size on last CT abdomen renal stone study. Interval she has had repeat Abdominal CT Renal study on 12/20/15 for follow-up R nephrolithiasis, radiology compared pulm nodule again, and remained stable without growth or change, average 4 mm, and this was compared to prior CT in 05/2014, overall no change in 19 months - Today she wants to follow-up on this issue and determine if any other imaging or testing is needed, she is a non smoker currently, formerly quit >20+ years ago, no known family history of lung cancer - Denies any new symptoms, chest pain, shortness of breath, productive cough, hemoptysis  Leg Cramping / Possible Restless Leg - Reports new complaint intermittently noticed it more recently, has had calf spasms, usually at night, with tight painful muscle spasm, usually temporary gradually resolves but has some residual muscle spasm left over, improved with rubbing her muscle, no other locations of spasm. No treatments tried.  - Taking vitamin D3, but no other supplements, admits to some days poor hydration and she has noticed it is worse at that time - Denies sense of feeling she has to move her legs at night, numbness, tingling, weakness  RIGHT NEPHROLITHIASIS, follow-up - Followed by [facility] (Dr Ernestine Conrad), last visit 12/21/15 reviewed CT renal study, without evidence of the prior 43mm renal stone, small non obstructing stone seen. Her symptoms have resolved with the antibiotics. - She states plan is to follow-up with 24 hr urine study to check stone composition - Denies any new complaints today, dysuria,  hematuria, flank pain, frequency    Social History  Substance Use Topics  . Smoking status: Former Research scientist (life sciences)  . Smokeless tobacco: Never Used     Comment: quit 20 years  . Alcohol use No    Review of Systems Per HPI unless specifically indicated above     Objective:    BP 106/86   Pulse 74   Temp 98.1 F (36.7 C) (Oral)   Resp 16   Ht 5\' 4"  (1.626 m)   Wt 245 lb (111.1 kg)   LMP 10/15/2015 Comment: abalation done 5 years ago / perimenopausal  BMI 42.05 kg/m   Wt Readings from Last 3 Encounters:  12/30/15 245 lb (111.1 kg)  12/21/15 245 lb 3.2 oz (111.2 kg)  12/07/15 244 lb 9.6 oz (110.9 kg)    Physical Exam  Constitutional: She appears well-developed and well-nourished. No distress.  Well-appearing, comfortable, cooperative  HENT:  Head: Normocephalic and atraumatic.  Mouth/Throat: Oropharynx is clear and moist.  Neck: Neck supple.  Cardiovascular: Normal rate and intact distal pulses.   Pulmonary/Chest: Effort normal and breath sounds normal. No respiratory distress. She has no wheezes. She has no rales.  Good air movement.  Abdominal: Soft.  Musculoskeletal:  Bilateral lower legs normal appearing. Calves non tender, without significant appreciable muscle spasm. No erythema or edema.  Neurological: She is alert.  Skin: Skin is warm and dry. She is not diaphoretic.  Nursing note and vitals reviewed.    Results for orders placed or performed in visit on 12/21/15  Microscopic Examination  Result Value Ref Range  WBC, UA 6-10 (A) 0 - 5 /hpf   RBC, UA 0-2 0 - 2 /hpf   Epithelial Cells (non renal) >10 (A) 0 - 10 /hpf   Mucus, UA Present (A) Not Estab.   Bacteria, UA Few None seen/Few  Urinalysis, Complete  Result Value Ref Range   Specific Gravity, UA >1.030 (H) 1.005 - 1.030   pH, UA 5.5 5.0 - 7.5   Color, UA Yellow Yellow   Appearance Ur Cloudy (A) Clear   Leukocytes, UA Trace (A) Negative   Protein, UA 1+ (A) Negative/Trace   Glucose, UA Negative Negative    Ketones, UA Negative Negative   RBC, UA Negative Negative   Bilirubin, UA Negative Negative   Urobilinogen, Ur 0.2 0.2 - 1.0 mg/dL   Nitrite, UA Negative Negative   Microscopic Examination See below:        I have personally reviewed the radiology report from 12/20/15 CT Chest, Abdomen/Pelvis  CLINICAL DATA:  Right flank pain  EXAM: CT CHEST, ABDOMEN AND PELVIS WITHOUT CONTRAST  TECHNIQUE: Multidetector CT imaging of the chest, abdomen and pelvis was performed following the standard protocol without IV contrast.  COMPARISON:  Multiple exams, including 05/07/2014 and 12/09/2014 in addition to renal ultrasound from 12/27/2015  FINDINGS: CT CHEST FINDINGS  Cardiovascular: Density within the right coronary artery may be from atherosclerotic calcification or stent.  Mediastinum/Nodes: No adenopathy in the chest. No esophageal abnormality seen in the chest.  Lungs/Pleura: Mild biapical pleuroparenchymal scarring. 0.5 by 0.3 cm right lower lobe pulmonary nodule on image 68/5, no change from earliest available comparison of 05/07/2014.  Musculoskeletal: Mild thoracic spondylosis.  CT ABDOMEN PELVIS FINDINGS  Hepatobiliary: Cholecystectomy.  Pancreas: Unremarkable  Spleen: Unremarkable  Adrenals/Urinary Tract: 2 mm left kidney lower pole nonobstructive calculus, image 68/602. No hydronephrosis or other urinary tract calculi identified. Adrenal glands normal.  Stomach/Bowel: Unremarkable.  Appendix normal.  Vascular/Lymphatic: Aortoiliac atherosclerotic vascular disease. No pathologic adenopathy identified.  Reproductive: Unremarkable  Other: No supplemental non-categorized findings.  Musculoskeletal: Lumbar spondylosis and degenerative disc disease at L5-S1 without significant impingement identified.  IMPRESSION: 1. I do not see a compelling cause for right flank pain. The appendix appears normal. 2. 2 mm left kidney lower pole  nonobstructive calculus. 3. Average size 4 mm right lower lobe pulmonary nodule, no change over the past 19 months, considered benign. 4. Right coronary artery density could be from atherosclerotic calcification or stent. 5. Thoracic spondylosis with spondylosis and degenerative disc disease at L5-S1, without observed impingement. 6.  Aortoiliac atherosclerotic vascular disease.   Electronically Signed   By: Van Clines M.D.   On: 12/20/2015 17:16  Assessment & Plan:   Problem List Items Addressed This Visit    Right nephrolithiasis    Currently seems resolved, not seen on last CT, pain resolved after UTI treatment Following with [facility] for future 24 hr urine to determine composition of stones      Pulmonary nodule - Primary    Stable, suspected benign small avg 4 mm R lower incidental pulm nodule, on last chest CT 12/21/15, unchanged compared to prior CTs to 05/2014 - Low risk for lung CA given non smoker, quit >20 years ago - Asymptomatic  Plan: 1. Reassurance today, given already adequate follow-up with stable on imaging over 19 months without change. Given size < 6 mm and no concerning features, recommendations satisfied currently, if were to be larger >6 mm then repeat 18-24 months, however this has already been accomplished as well 2. Follow-up  in future on any other CTs she needs for other reasons, likely renal stone study, no need for dedicated pulm nodule imaging follow-up unless clinical change      Nocturnal leg cramps    Suspected muscle spasms in calves due to variety of factors (most likely occasional poor hydration), not consistent with restless leg syndrome or other pathology - Last labs normal K  Plan: 1. Conservative trial first with improve water intake, increase K rich vegetables, start OTC mag supplement, take daily MVI 2. Follow-up as needed, if not improving, consider gabapentin trial at night, can check mag, or if more concern RLS can  consider ropinerole but seems unlikely         No orders of the defined types were placed in this encounter.     Follow up plan: Return in about 3 months (around 03/29/2016) for Annual physical.  Sheri Putnam, DO [facility] 12/30/2015, 1:56 PM

## 2016-01-01 DIAGNOSIS — Z23 Encounter for immunization: Secondary | ICD-10-CM | POA: Diagnosis not present

## 2016-01-12 ENCOUNTER — Other Ambulatory Visit: Payer: Self-pay | Admitting: Interventional Cardiology

## 2016-03-16 ENCOUNTER — Other Ambulatory Visit: Payer: Self-pay

## 2016-04-11 ENCOUNTER — Encounter: Payer: Self-pay | Admitting: Family Medicine

## 2016-04-11 ENCOUNTER — Ambulatory Visit (INDEPENDENT_AMBULATORY_CARE_PROVIDER_SITE_OTHER): Payer: BLUE CROSS/BLUE SHIELD | Admitting: Family Medicine

## 2016-04-11 VITALS — BP 116/81 | HR 87 | Temp 98.3°F | Resp 16 | Ht 64.0 in | Wt 245.6 lb

## 2016-04-11 DIAGNOSIS — R1031 Right lower quadrant pain: Secondary | ICD-10-CM | POA: Diagnosis not present

## 2016-04-11 DIAGNOSIS — R197 Diarrhea, unspecified: Secondary | ICD-10-CM

## 2016-04-11 DIAGNOSIS — R112 Nausea with vomiting, unspecified: Secondary | ICD-10-CM | POA: Diagnosis not present

## 2016-04-11 MED ORDER — DICYCLOMINE HCL 10 MG PO CAPS: 10.0000 mg | ORAL_CAPSULE | Freq: Three times a day (TID) | ORAL | 0 refills | Status: DC

## 2016-04-11 MED ORDER — ONDANSETRON 4 MG PO TBDP: 4.0000 mg | ORAL_TABLET | Freq: Three times a day (TID) | ORAL | 0 refills | Status: DC | PRN

## 2016-04-11 NOTE — Progress Notes (Addendum)
Subjective:    Patient ID: Sheri [name], female    DOB: 08-20-1965, 51 y.o.   MRN: [id number]  Sheri [name] is a 51 y.o. female presenting on 04/11/2016 for Abdominal Pain (onset sunday was very nausea and has pain on Right side and today after eating thrawing up and has some diarrhea)  Patient presents for a same day appointment.  HPI   RIGHT ABDOMINAL PAIN: Reports symptoms started 2 days ago (in afternoon 4-5pm) with acute onset Right upper abdomen with gasy bloated pains 8-10/10 with sharp pains intermittent mostly constant, worse when laying on right side, worse if you applied pressure. Admitted to associated nausea without vomiting. Did not have diarrhea or other symptoms yesterday. - Today felt moderately improved, had only residual dull ache, worsening after eating 1st meal today (lunch) with x 1 episode acute diarrhea with loose watery stool (non bloody or dark), and then vomiting within 15 min of eating (non bloody non bilious), then felt worst - Now significantly improved with only mild dull ache mostly resolved, wanted to get it checked out today - No prior similar episodes - No recent sick contacts - Prior history of Nephrolithiasis, last imaging with 68mm stone on R, followed by Urology. This does not feel like prior kidney stone to her - Admits reduced appetite, feels "off temperature, and clamy" - Denies any fevers/chills, sweats, congestion, flu-like symptoms, muscle aches, sinus congestion, ear pain or congestion, cough, dysuria, hematuria, flank pain or back pain  Past Surgical History:  Procedure Laterality Date  . CARDIAC SURGERY    . CESAREAN SECTION    . CHOLECYSTECTOMY    . CYSTOSCOPY WITH STENT PLACEMENT Left 11/01/2015   Procedure: CYSTOSCOPY WITH STENT PLACEMENT;  Surgeon: Hollice Espy, MD;  Location: [facility] ORS;  Service: Urology;  Laterality: Left;  . CYSTOSCOPY/RETROGRADE/URETEROSCOPY Bilateral 11/01/2015   Procedure: CYSTOSCOPY/RETROGRADE/URETEROSCOPY;   Surgeon: Hollice Espy, MD;  Location: [facility] ORS;  Service: Urology;  Laterality: Bilateral;  . LEFT HEART CATHETERIZATION WITH CORONARY ANGIOGRAM N/A 04/06/2014   Procedure: LEFT HEART CATHETERIZATION WITH CORONARY ANGIOGRAM;  Surgeon: Jettie Booze, MD;  Location: [facility] CATH LAB;  Service: Cardiovascular;  Laterality: N/A;    Social History  Substance Use Topics  . Smoking status: Former Research scientist (life sciences)  . Smokeless tobacco: Never Used     Comment: quit 20 years  . Alcohol use No    Review of Systems Per HPI unless specifically indicated above     Objective:    BP 116/81   Pulse 87   Temp 98.3 F (36.8 C) (Oral)   Resp 16   Ht 5\' 4"  (1.626 m)   Wt 245 lb 9.6 oz (111.4 kg)   BMI 42.16 kg/m   Wt Readings from Last 3 Encounters:  04/11/16 245 lb 9.6 oz (111.4 kg)  12/30/15 245 lb (111.1 kg)  12/21/15 245 lb 3.2 oz (111.2 kg)    Physical Exam  Constitutional: She is oriented to person, place, and time. She appears well-developed and well-nourished. No distress.  Mostly well-appearing, mildly uncomfortable with R abdominal pain, cooperative, not diaphoretic but has mild evidence of slight prior sweat  HENT:  Head: Normocephalic and atraumatic.  Mouth/Throat: Oropharynx is clear and moist.  Eyes: Conjunctivae are normal. Right eye exhibits no discharge. Left eye exhibits no discharge.  Cardiovascular: Normal rate, regular rhythm, normal heart sounds and intact distal pulses.   No murmur heard. Pulmonary/Chest: Effort normal and breath sounds normal. No respiratory distress. She has no wheezes. She has  no rales.  Abdominal: Soft. Bowel sounds are normal. She exhibits no distension and no mass. There is tenderness (Localized Right mid to lower quadrant. Pain worst with rebound compared to significant deeper palpation). There is rebound. There is no guarding.  Rovsing's sign with RLQ pain upon palpation of Left lower quadrant, is mildly positive.  Improved pain with left knee flexed  and right leg straightened.  Musculoskeletal: She exhibits no edema.  No back pain. No CVAT  Neurological: She is alert and oriented to person, place, and time.  Skin: Skin is warm and dry. No rash noted. She is not diaphoretic. No erythema.  Psychiatric: Her behavior is normal.  Nursing note and vitals reviewed.       Assessment & Plan:   Problem List Items Addressed This Visit    None    Visit Diagnoses    Right lower quadrant abdominal pain    -  Primary   Relevant Medications   dicyclomine (BENTYL) 10 MG capsule   ondansetron (ZOFRAN ODT) 4 MG disintegrating tablet   Other Relevant Orders   CBC with Differential/Platelet   Comprehensive metabolic panel   Lipase   Non-intractable vomiting with nausea, unspecified vomiting type       Diarrhea, unspecified type         50 yr female with acute onset RLQ abdominal pain about 2 days ago, now with some interval improvement, did have some worsening with nausea, vomiting, x 1 diarrhea episode, now improved. Still notable pain to prompt evaluation but reported improvement. - Clinically hemodynamically stable, mostly well appearing, some discomfort on abdominal exam overall, patient is very tolerable of exam and not consistent with an acute abdomen, but it is not entirely benign either, with concern of some mild rebound to RLQ worsening especially with L palpation affecting R, overall not consistent with peritoneal signs either - Tolerating some PO currently, no evidence dehydration today, resolved diarrhea - Differential is broad: concern for appendicitis given constellation of symptoms and localized pain, she is s/p choley years ago, not drink alcohol much at all unlikely pancreatitis. Considered nephrolithiasis, not consistent with prior episodes, no known large stones on last scan, no urinary symptoms. Most benign etiology would be viral gastroenteritis with nausea/vomiting/diarrhea, but concern with localized abdominal pain.  Plan: 1.  Discussion today on possible diagnoses, emphasized concern for possible appendicitis, however given notable clinical improvement from yesterday to today, despite still pain, mutual agreement to try symptomatic medications, and check STAT blood work today for initial management. I advised patient that best way to expedite diagnosis and management and avoid potential worsening would be to go to Hospital ED for labwork, possible IVF medication nausea/analgesia, and likely abdominal imaging CT. She understands the risks to do this outpatient management, and we agree that given improvement this would be a potential option. 2. Check STAT CBC, CMET, Lipase - [facility] outside draw today, follow-up results later today vs tomorrow 3. Start symptomatic control with Dicyclomine PRN abdominal pain/cramping and Zofran PRN nausea/vomit 4. Strict return criteria reviewed if worsening within 24-48 hours or later, when to go to [facility] ED 5. If gradual improvement and want to follow-up can return to office, but either will be self limited viral syndrome or if resolving limited other treatment options.  **UPDATE 04/11/16 at 8:08pm** - Received phone call from [facility] with STAT lab results, received abnormal only results and one additional result. Full results to be available in chart and via fax within 24 hours - Lab reported the  following approximate lab values: Abnormal absolute neutrophil count about 7 (ANC), LFTs AST/ALT both 40-50, Lipase 71. Additional normal lab value requested WBC approx 10 - Called patient tonight to review these results. She states her abdominal pain is mostly unchanged, not worsening though. She has not started the medications prescribed. Reviewed the above lab results. Concern that these results cannot rule out such dx as appendicitis as discussed earlier, seems slightly less likely with normal WBC, concern with elevated ANC may be early infection. Elevated LFTs, could be from mild dehydration / GI  losses possibly, clinically not consistent with a hepatitis and these lab values are still quite low for any significant hepatic pathology. Lipase at 77, is only mild to moderate elevated, no prior lab value to compare, no prior pancreatic problem, only consumed one mimosa earlier on day of onset symptoms and s/p choley and TG normal range, unlikely to be acute pancreatitis, based on symptoms pain not triggered or worsened by PO intake. Advised her overall that lab results are non-specific, now treatment should be guided on her clinical symptoms, she does not want to go to hospital if she can avoid it. Advised if any significant worsening abdominal pain or associated symptoms as discussed within next 24-48 hours needs to go to Hospital ED, otherwise can contact office or follow-up later this week for re-evaluation, but limited treatment options in outpatient setting for acute abdominal pain. She understands.  Meds ordered this encounter  Medications  . dicyclomine (BENTYL) 10 MG capsule    Sig: Take 1 capsule (10 mg total) by mouth 4 (four) times daily -  before meals and at bedtime.    Dispense:  30 capsule    Refill:  0  . ondansetron (ZOFRAN ODT) 4 MG disintegrating tablet    Sig: Take 1 tablet (4 mg total) by mouth every 8 (eight) hours as needed for nausea or vomiting.    Dispense:  30 tablet    Refill:  0      Follow up plan: Return in about 1 week (around 04/18/2016), or if symptoms worsen or fail to improve, for abdominal pain.  [name], [facility] 04/11/2016, 6:11 PM

## 2016-04-11 NOTE — Patient Instructions (Signed)
Thank you for coming in to clinic today.  1. As discussed, I am concerned about your Right lower abdominal pain, given the localized nature of it. Possible to be Appendix, either infection early inflammation. Also could be stomach bug or virus, as discussed may continue to improve  - Ordered STAT labs- go to [facility] now, will notify you with results either tonight or in AM - Start Dicyclomine as needed up to 4 times daily with food for abdominal cramping - Start Zofran as needed for nausea, vomiting  Try to stay well hydrated  May take Tylenol as needed  If any significant worsening abdominal pain, nausea, vomiting, fevers, next option is to go directly to Emergency Department may need CT scan or other evaluation to rule out possible appendicitis  Please schedule a follow-up appointment with Dr. Parks Ranger within 1 week as needed for abdominal pain if mild improved. Otherwise needs to go to ED if significant worsening  If you have any other questions or concerns, please feel free to call the clinic or send a message through [place]. You may also schedule an earlier appointment if necessary.  [name], DO [place]

## 2016-04-12 ENCOUNTER — Telehealth: Payer: Self-pay | Admitting: Family Medicine

## 2016-04-12 LAB — CBC WITH DIFFERENTIAL/PLATELET
BASOS ABS: 0 10*3/uL (ref 0.0–0.2)
Basos: 0 %
EOS (ABSOLUTE): 0.1 10*3/uL (ref 0.0–0.4)
Eos: 1 %
Hematocrit: 44.2 % (ref 34.0–46.6)
Hemoglobin: 14.8 g/dL (ref 11.1–15.9)
IMMATURE GRANS (ABS): 0 10*3/uL (ref 0.0–0.1)
IMMATURE GRANULOCYTES: 0 %
LYMPHS: 23 %
Lymphocytes Absolute: 2.3 10*3/uL (ref 0.7–3.1)
MCH: 29.2 pg (ref 26.6–33.0)
MCHC: 33.5 g/dL (ref 31.5–35.7)
MCV: 87 fL (ref 79–97)
Monocytes Absolute: 0.4 10*3/uL (ref 0.1–0.9)
Monocytes: 4 %
NEUTROS PCT: 72 %
Neutrophils Absolute: 7.3 10*3/uL — ABNORMAL HIGH (ref 1.4–7.0)
PLATELETS: 291 10*3/uL (ref 150–379)
RBC: 5.06 x10E6/uL (ref 3.77–5.28)
RDW: 13.6 % (ref 12.3–15.4)
WBC: 10.1 10*3/uL (ref 3.4–10.8)

## 2016-04-12 LAB — COMPREHENSIVE METABOLIC PANEL
ALK PHOS: 110 IU/L (ref 39–117)
ALT: 59 IU/L — ABNORMAL HIGH (ref 0–32)
AST: 41 IU/L — ABNORMAL HIGH (ref 0–40)
Albumin/Globulin Ratio: 1.6 (ref 1.2–2.2)
Albumin: 4.5 g/dL (ref 3.5–5.5)
BILIRUBIN TOTAL: 0.4 mg/dL (ref 0.0–1.2)
BUN / CREAT RATIO: 18 (ref 9–23)
BUN: 14 mg/dL (ref 6–24)
CALCIUM: 9.6 mg/dL (ref 8.7–10.2)
CHLORIDE: 99 mmol/L (ref 96–106)
CO2: 28 mmol/L (ref 18–29)
CREATININE: 0.76 mg/dL (ref 0.57–1.00)
GFR calc Af Amer: 105 mL/min/{1.73_m2} (ref >59)
GFR calc non Af Amer: 91 mL/min/{1.73_m2} (ref >59)
GLUCOSE: 96 mg/dL (ref 65–99)
Globulin, Total: 2.9 g/dL (ref 1.5–4.5)
Potassium: 4.3 mmol/L (ref 3.5–5.2)
SODIUM: 140 mmol/L (ref 134–144)
TOTAL PROTEIN: 7.4 g/dL (ref 6.0–8.5)

## 2016-04-12 LAB — LIPASE: Lipase: 77 U/L — ABNORMAL HIGH (ref 14–72)

## 2016-04-12 NOTE — Telephone Encounter (Signed)
Can you call patient back to triage her current symptoms further? I saw her in office yesterday afternoon for RLQ abdominal pain, and called her last night to review stat lab results and checked on her symptoms, she was mostly unchanged at that time.  I have advised her to seek further management in the Hospital ED for acute worsening abdominal pain, given my concerns if she is not better. I agreed to do the STAT Labs and symptomatic control medications as a trial, but I don't have too much more to offer her from outpatient office.  [name], [facility] 04/12/2016, 2:01 PM

## 2016-04-12 NOTE — Telephone Encounter (Signed)
Pt was called with her lab results and she has a couple of question and concern with the way she is feeling now.  Her call back number is [phone]

## 2016-04-12 NOTE — Telephone Encounter (Signed)
Patient triaged:  She reports feeling better late last night. She ate this am and felt nauseous and abdomen tender but not painful. Discussed symptoms with Dr. Raliegh Ip.  Patient advised she could ride sx out for couple of days as long as pain does not get worse and she is able to keep food down.  If sx get worse patient advised to go to ED. ED can do stat imaging and hydration.  Patient verbalizes understanding.

## 2016-05-02 NOTE — Progress Notes (Signed)
Patient ID: Sheri [name], female   DOB: 1965-10-06, 51 y.o.   MRN: [id number]     Cardiology Office Note   Date:  05/03/2016   ID:  Sheri [name], DOB [birth year], MRN [id number]  PCP:  [name], DO    No chief complaint on file. f/u CAD   Wt Readings from Last 3 Encounters:  05/03/16 247 lb (112 kg)  04/11/16 245 lb 9.6 oz (111.4 kg)  12/30/15 245 lb (111.1 kg)       History of Present Illness: ADDI PAK is a 51 y.o. female  has known CAD with an inferior MI in 3/16. She had a stent to the RCA. 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 At the time of her MI, she had excessive sweatiness and nausea along with tooth pain. She was lightheaded and felt that she would pass out. She got home and had left arm, left jaw and chest pressure. She knew it was an MI and came to the hospital.   She has been very anxious about her situation on past visits. She is concerned because her father died from a heart attack at age 52. She is concerned about another cardiac event occurring.  She visited the emergency room at [facility] on May 07, 2014. At that time, she had been having some blood in her urine along with some back pain and shortness of breath. She described pain in her right side of her back. She was in the emergency room and had a negative troponin. They did find that her total CK was elevated at 1488. She had a negative chest x-ray. They were not concerned about ischemia. They decreased her atorvastatin from 80 mg down to 40 mg.   She admits to having a lot of emotional difficulty shortly after her cardiac event. She saw [facility] regarding this and by May 2016, she was asymptomatic in terms of chest discomfort.  Her most significant issue over the last few months has been weight gain and shortness of breath. She feels that with minimal activity, she gets short of breath.   We have had long discussions in the past regarding whether or not the anomalous  circumflex would have significant disease. The husband reports that the patient does snore and stop breathing at times at night but he states it is "not that bad." I did explain to him that there is a formal sleep study process which actually quantitates the number of apneic episodes that occur.   In late 2016, the husband was adamant that something was wrong with her heart. Because their deductible has been paid for that year, he wanted more testing in the that calendar year.    She feels occasional swelling in her ankles and in her hands. There has been no orthopnea or PND.  Last cardiac workup included a normal chest x-ray and normal BNP in 2017. Echocardiogram is revealed normal left ventricular function post MI.  The patient does not report symptoms like what she had with her MI. It is now shortness of breath which is her biggest complaint along with weight gain. She feels that she is not able to exercise to the level that she could before.  She walks in the summer. She does not walk in the winter, due to cold weather.  She does have a treadmill at home but she doesn't like to walk on it. She does admit that it is good for her, she just needs to do it.  Her daughter is with her today and tells me that the patient's diet can improve, and that she does not exercise at all.      Past Medical History:  Diagnosis Date  . Heart attack 03/2014  . Heart disease   . History of C-section   . HLD (hyperlipidemia)   . Hx of heart artery stent   . Hypertension     Past Surgical History:  Procedure Laterality Date  . CARDIAC SURGERY    . CESAREAN SECTION    . CHOLECYSTECTOMY    . CYSTOSCOPY WITH STENT PLACEMENT Left 11/01/2015   Procedure: CYSTOSCOPY WITH STENT PLACEMENT;  Surgeon: Hollice Espy, MD;  Location: [facility] ORS;  Service: Urology;  Laterality: Left;  . CYSTOSCOPY/RETROGRADE/URETEROSCOPY Bilateral 11/01/2015   Procedure: CYSTOSCOPY/RETROGRADE/URETEROSCOPY;  Surgeon: Hollice Espy, MD;   Location: [facility] ORS;  Service: Urology;  Laterality: Bilateral;  . LEFT HEART CATHETERIZATION WITH CORONARY ANGIOGRAM N/A 04/06/2014   Procedure: LEFT HEART CATHETERIZATION WITH CORONARY ANGIOGRAM;  Surgeon: Jettie Booze, MD;  Location: [facility] CATH LAB;  Service: Cardiovascular;  Laterality: N/A;     Current Outpatient Prescriptions  Medication Sig Dispense Refill  . aspirin 81 MG chewable tablet Chew 1 tablet (81 mg total) by mouth daily.    . clopidogrel (PLAVIX) 75 MG tablet TAKE 1 TABLET (75 MG TOTAL) BY MOUTH DAILY. 30 tablet 10  . escitalopram (LEXAPRO) 10 MG tablet Take 1 tablet (10 mg total) by mouth daily. 30 tablet 11  . isosorbide mononitrate (IMDUR) 30 MG 24 hr tablet TAKE 1 TABLET (30 MG TOTAL) BY MOUTH DAILY. 90 tablet 3  . lisinopril (PRINIVIL,ZESTRIL) 5 MG tablet Take 1 tablet (5 mg total) by mouth daily. 30 tablet 8  . metoprolol tartrate (LOPRESSOR) 25 MG tablet Take 1 tablet (25 mg total) by mouth 2 (two) times daily. 180 tablet 3  . nitroGLYCERIN (NITROSTAT) 0.4 MG SL tablet Place 1 tablet (0.4 mg total) under the tongue every 5 (five) minutes x 3 doses as needed for chest pain. 25 tablet 2  . Probiotic Product (ALIGN) 4 MG CAPS Take 4 mg by mouth 2 (two) times daily.    . rosuvastatin (CRESTOR) 10 MG tablet Take 1 tablet (10 mg total) by mouth daily. 30 tablet 6  . dicyclomine (BENTYL) 10 MG capsule Take 1 capsule (10 mg total) by mouth 4 (four) times daily -  before meals and at bedtime. 30 capsule 0   No current facility-administered medications for this visit.     Allergies:   Latex and Penicillins    Social History:  The patient  reports that she has quit smoking. She has never used smokeless tobacco. She reports that she does not drink alcohol or use drugs.   Family History:  The patient's family history includes Heart attack (age of onset: 20) in her father; Hyperlipidemia in her mother; Hypertension in her mother; Kidney Stones in her brother and sister; Liver  cancer in her maternal grandmother; Stroke in her paternal grandfather.    ROS:  Please see the history of present illness.   Otherwise, review of systems are positive for weight gain, dyspnea on exertion.   All other systems are reviewed and negative.    PHYSICAL EXAM: VS:  BP 110/80   Pulse 72   Ht 5\' 4"  (1.626 m)   Wt 247 lb (112 kg)   BMI 42.40 kg/m  , BMI Body mass index is 42.4 kg/m. GEN: Well nourished, well developed, in no acute distress  HEENT:  normal  Neck: no JVD, carotid bruits, or masses Cardiac: RRR; no murmurs, rubs, or gallops,no edema  Respiratory:  clear to auscultation bilaterally, normal work of breathing GI: soft, nontender, nondistended, + BS, obese MS: no deformity or atrophy  Skin: warm and dry, no rash Neuro:  Strength and sensation are intact Psych: euthymic mood, full affect, anxious    Recent Labs: 04/11/2016: ALT 59; BUN 14; Creatinine, Ser 0.76; Platelets 291; Potassium 4.3; Sodium 140   Lipid Panel    Component Value Date/Time   CHOL 130 07/27/2014 0943   TRIG 124.0 07/27/2014 0943   HDL 29.10 (L) 07/27/2014 0943   CHOLHDL 4 07/27/2014 0943   VLDL 24.8 07/27/2014 0943   LDLCALC 76 07/27/2014 0943     Other studies Reviewed: Additional studies/ records that were reviewed today with results demonstrating: cath report, echo results as above.   ASSESSMENT AND PLAN:  1. Dyspnea on exertion/CAD: The dyspnea Persists. No nitroglycerin use. No symptoms like her prior MI.Marland Kitchen  She has not been exercising and has likely lost some of the exercise tolerance that she had in the past.  Continue aggressive secondary prevention. Could consider stopping aspirin in the future. I would likely continue clopidogrel given her strong family history of heart disease. 2. Obesity: I think this is somewhat cyclical. She feels worse and exerts himself less which contributes to the obesity which then contributes to her dyspnea. We spoke about decreasing simple sugars  from her diet like Pepsi and milkshakes. I have set a goal of 15 pound weight loss over the next 6 months for her. 3. Hyperlipidemia: Lipids much better controlled back in June 2016 on statin therapy.  Will recheck lipids today. Liver tests were elevated for an unknown reason a in March 2018. She had right-sided pain at that time.  If she has sx lke her MI, she should proceed to the ER.    Current medicines are reviewed at length with the patient today.  The patient concerns regarding her medicines were addressed.  The following changes have been made:  No change   Labs/ tests ordered today include:   No orders of the defined types were placed in this encounter.   Recommend 150 minutes/week of aerobic exercise- she again mentions the cold weather as a reason to not exercise.  I told her that next winter would be cold again next year and if she did not exercise for long periods of time, her stamina decreases and her shortness of breath will increase. Low fat, low carb, high fiber diet recommended  Disposition:   FU in 6 months to check on weight loss   Signed, Larae Grooms, MD  05/03/2016 9:06 AM    [facility] [place], [place], [place]  [address] Phone: [phone]; Fax: [phone]

## 2016-05-03 ENCOUNTER — Encounter: Payer: Self-pay | Admitting: Interventional Cardiology

## 2016-05-03 ENCOUNTER — Ambulatory Visit (INDEPENDENT_AMBULATORY_CARE_PROVIDER_SITE_OTHER): Payer: BLUE CROSS/BLUE SHIELD | Admitting: Interventional Cardiology

## 2016-05-03 VITALS — BP 110/80 | HR 72 | Ht 64.0 in | Wt 247.0 lb

## 2016-05-03 DIAGNOSIS — E669 Obesity, unspecified: Secondary | ICD-10-CM

## 2016-05-03 DIAGNOSIS — E782 Mixed hyperlipidemia: Secondary | ICD-10-CM

## 2016-05-03 DIAGNOSIS — Z6841 Body Mass Index (BMI) 40.0 and over, adult: Secondary | ICD-10-CM

## 2016-05-03 DIAGNOSIS — I252 Old myocardial infarction: Secondary | ICD-10-CM | POA: Diagnosis not present

## 2016-05-03 DIAGNOSIS — R0609 Other forms of dyspnea: Secondary | ICD-10-CM

## 2016-05-03 DIAGNOSIS — I2510 Atherosclerotic heart disease of native coronary artery without angina pectoris: Secondary | ICD-10-CM | POA: Diagnosis not present

## 2016-05-03 DIAGNOSIS — R0600 Dyspnea, unspecified: Secondary | ICD-10-CM

## 2016-05-03 DIAGNOSIS — IMO0001 Reserved for inherently not codable concepts without codable children: Secondary | ICD-10-CM

## 2016-05-03 LAB — LIPID PANEL
Chol/HDL Ratio: 6.3 ratio — ABNORMAL HIGH (ref 0.0–4.4)
Cholesterol, Total: 214 mg/dL — ABNORMAL HIGH (ref 100–199)
HDL: 34 mg/dL — ABNORMAL LOW (ref >39)
LDL CALC: 148 mg/dL — AB (ref 0–99)
Triglycerides: 160 mg/dL — ABNORMAL HIGH (ref 0–149)
VLDL CHOLESTEROL CAL: 32 mg/dL (ref 5–40)

## 2016-05-03 LAB — HEPATIC FUNCTION PANEL
ALBUMIN: 4.3 g/dL (ref 3.5–5.5)
ALT: 66 IU/L — ABNORMAL HIGH (ref 0–32)
AST: 38 IU/L (ref 0–40)
Alkaline Phosphatase: 95 IU/L (ref 39–117)
Bilirubin Total: 0.3 mg/dL (ref 0.0–1.2)
Bilirubin, Direct: 0.09 mg/dL (ref 0.00–0.40)
TOTAL PROTEIN: 6.8 g/dL (ref 6.0–8.5)

## 2016-05-03 NOTE — Patient Instructions (Signed)
Medication Instructions:  Your physician recommends that you continue on your current medications as directed. Please refer to the Current Medication list given to you today.   Labwork: LIPIDS, LFT  Testing/Procedures: None.  Follow-Up: Your physician wants you to follow-up in: 6 months with Dr. Irish Lack. You will receive a reminder letter in the mail two months in advance. If you don't receive a letter, please call our office to schedule the follow-up appointment.   Any Other Special Instructions Will Be Listed Below (If Applicable).     If you need a refill on your cardiac medications before your next appointment, please call your pharmacy.

## 2016-05-09 ENCOUNTER — Other Ambulatory Visit: Payer: Self-pay

## 2016-05-09 ENCOUNTER — Telehealth: Payer: Self-pay

## 2016-05-09 ENCOUNTER — Other Ambulatory Visit: Payer: Self-pay | Admitting: Family Medicine

## 2016-05-09 DIAGNOSIS — F32A Depression, unspecified: Secondary | ICD-10-CM

## 2016-05-09 DIAGNOSIS — Z79899 Other long term (current) drug therapy: Secondary | ICD-10-CM

## 2016-05-09 DIAGNOSIS — F329 Major depressive disorder, single episode, unspecified: Secondary | ICD-10-CM

## 2016-05-09 DIAGNOSIS — F419 Anxiety disorder, unspecified: Principal | ICD-10-CM

## 2016-05-09 MED ORDER — ROSUVASTATIN CALCIUM 20 MG PO TABS: 20.0000 mg | ORAL_TABLET | Freq: Every day | ORAL | 3 refills | Status: DC

## 2016-05-09 MED ORDER — ESCITALOPRAM OXALATE 10 MG PO TABS: 10.0000 mg | ORAL_TABLET | Freq: Every day | ORAL | 0 refills | Status: DC

## 2016-05-09 MED ORDER — ROSUVASTATIN CALCIUM 20 MG PO TABS: 20.0000 mg | ORAL_TABLET | Freq: Every day | ORAL | 0 refills | Status: DC

## 2016-05-09 NOTE — Telephone Encounter (Signed)
-----   Message from Jettie Booze, MD sent at 05/04/2016  1:10 PM EDT ----- LDL has increased significantly.  Liver tests essentially unchanged.  Has she been taking the rosuvastatin?

## 2016-05-09 NOTE — Telephone Encounter (Signed)
Patient made aware of Dr. Hassell Done recommendations to improve diet and increase Crestor to 20 mg QD. Prescription sent to patient's preferred pharmacy. Patient made aware that lipids can increase with menopause so that could be the reason for the drastic increase. Repeat LIPIDS and LFTs ordered and scheduled for 08/08/16. Patient verbalized understanding.

## 2016-05-09 NOTE — Telephone Encounter (Signed)
Must have appointment prior to additional refills.

## 2016-05-10 ENCOUNTER — Other Ambulatory Visit: Payer: Self-pay | Admitting: *Deleted

## 2016-05-10 ENCOUNTER — Other Ambulatory Visit: Payer: Self-pay

## 2016-05-10 DIAGNOSIS — F329 Major depressive disorder, single episode, unspecified: Secondary | ICD-10-CM

## 2016-05-10 DIAGNOSIS — F419 Anxiety disorder, unspecified: Principal | ICD-10-CM

## 2016-05-10 DIAGNOSIS — F32A Depression, unspecified: Secondary | ICD-10-CM

## 2016-05-10 MED ORDER — ROSUVASTATIN CALCIUM 20 MG PO TABS: 20.0000 mg | ORAL_TABLET | Freq: Every day | ORAL | 3 refills | Status: DC

## 2016-05-10 MED ORDER — ESCITALOPRAM OXALATE 10 MG PO TABS
10.0000 mg | ORAL_TABLET | Freq: Every day | ORAL | 0 refills | Status: DC
Start: 2016-05-10 — End: 2016-06-19

## 2016-06-19 ENCOUNTER — Other Ambulatory Visit: Payer: Self-pay | Admitting: Family Medicine

## 2016-06-19 ENCOUNTER — Encounter: Payer: Self-pay | Admitting: Family Medicine

## 2016-06-19 ENCOUNTER — Ambulatory Visit (INDEPENDENT_AMBULATORY_CARE_PROVIDER_SITE_OTHER): Payer: BLUE CROSS/BLUE SHIELD | Admitting: Family Medicine

## 2016-06-19 VITALS — BP 110/69 | HR 71 | Temp 98.2°F | Resp 16 | Ht 64.0 in | Wt 248.0 lb

## 2016-06-19 DIAGNOSIS — E538 Deficiency of other specified B group vitamins: Secondary | ICD-10-CM

## 2016-06-19 DIAGNOSIS — F419 Anxiety disorder, unspecified: Secondary | ICD-10-CM

## 2016-06-19 DIAGNOSIS — Z6841 Body Mass Index (BMI) 40.0 and over, adult: Secondary | ICD-10-CM

## 2016-06-19 DIAGNOSIS — F329 Major depressive disorder, single episode, unspecified: Secondary | ICD-10-CM

## 2016-06-19 DIAGNOSIS — F32A Depression, unspecified: Secondary | ICD-10-CM

## 2016-06-19 DIAGNOSIS — R5382 Chronic fatigue, unspecified: Secondary | ICD-10-CM

## 2016-06-19 DIAGNOSIS — I10 Essential (primary) hypertension: Secondary | ICD-10-CM

## 2016-06-19 DIAGNOSIS — E782 Mixed hyperlipidemia: Secondary | ICD-10-CM

## 2016-06-19 DIAGNOSIS — R7309 Other abnormal glucose: Secondary | ICD-10-CM

## 2016-06-19 DIAGNOSIS — Z0000 Encounter for general adult medical examination without abnormal findings: Secondary | ICD-10-CM

## 2016-06-19 MED ORDER — ESCITALOPRAM OXALATE 10 MG PO TABS: 10.0000 mg | ORAL_TABLET | Freq: Every day | ORAL | 11 refills | Status: DC

## 2016-06-19 NOTE — Progress Notes (Signed)
Subjective:    Patient ID: Sheri [name], female    DOB: May 15, 1965, 51 y.o.   MRN: [id number]  Sheri [name] is a 51 y.o. female presenting on 06/19/2016 for Anxiety (needs refill for lexapro)   HPI   Depression / Anxiety / Emotional Lability / Fatigue - Not previously discussed with patient, she does review prior history with no long term history of anxiety/depression, seems to have onset following 03/2014 with STEMI hospitalization and acute illness, afterwards had worsening depression symptoms, started on Lexapro 05/2015 over year later by PCP, has done well on 10mg  daily, now here for yearly renewal - Currently out of Lexapro for 2 days, with some nearly tendency for crying spells again and emotional lability, however she admits was overall 100% better on medicine - Admits significant life stressors still at home with children going to college, anticipates her stress will improve this Fall - Asking about hormone topical gel for mood - Admits decreased energy level - See PHQ - Denies suicidal or homicidal ideation  Morbid Obesity BMI >42 / History of Elevated A1c: - Prior history of A1c 5.6 in 2016, has had other abnormal glucose. Now concerns of persistent chronic fatigue, had attributed this to depression in past. - Currently trying to improve diet, now eats gluten free, previously saw nutritionist Gregory in 2016 when going through heart therapy - Limited exercise now - Worsening weight gain after 3rd baby and MI, then gain 50-60 lbs, difficulty losing weight now - Admits regular consumption of cherry coke previously (which has improved now) - Asks about possibility of trial on Contrave in future  Additionally reports that her RLQ abdominal pain from last visit 03/2016 has resolved now, tried some Dicyclmonine  Depression screen [facility] 2/9 06/19/2016 06/21/2015 06/21/2015  Decreased Interest 1 3 3   Down, Depressed, Hopeless 0 3 3  PHQ - 2 Score 1 6 6   Altered sleeping 3 3  3   Tired, decreased energy 3 3 3   Change in appetite 2 - 2  Feeling bad or failure about yourself  0 3 3  Trouble concentrating 1 3 3   Moving slowly or fidgety/restless 0 3 0  Suicidal thoughts 0 0 0  PHQ-9 Score 10 21 20   Difficult doing work/chores - Very difficult Very difficult   GAD 7 : Generalized Anxiety Score 06/19/2016 06/21/2015  Nervous, Anxious, on Edge 3 2  Control/stop worrying 0 3  Worry too much - different things 0 3  Trouble relaxing 1 3  Restless 0 2  Easily annoyed or irritable 0 3  Afraid - awful might happen 0 3  Total GAD 7 Score 4 19  Anxiety Difficulty Not difficult at all Very difficult     Social History  Substance Use Topics  . Smoking status: Former Research scientist (life sciences)  . Smokeless tobacco: Never Used     Comment: quit 20 years  . Alcohol use No   Review of Systems Per HPI unless specifically indicated above     Objective:    BP 110/69   Pulse 71   Temp 98.2 F (36.8 C) (Oral)   Resp 16   Ht 5\' 4"  (1.626 m)   Wt 248 lb (112.5 kg)   BMI 42.57 kg/m   Wt Readings from Last 3 Encounters:  06/19/16 248 lb (112.5 kg)  05/03/16 247 lb (112 kg)  04/11/16 245 lb 9.6 oz (111.4 kg)    Physical Exam  Constitutional: She is oriented to person, place, and time. She  appears well-developed and well-nourished. No distress.  Well-appearing, comfortable, cooperative, obese  HENT:  Head: Normocephalic and atraumatic.  Mouth/Throat: Oropharynx is clear and moist.  Eyes: Conjunctivae are normal.  Neck: Normal range of motion. Neck supple.  Cardiovascular: Normal rate, regular rhythm, normal heart sounds and intact distal pulses.   No murmur heard. Pulmonary/Chest: Effort normal and breath sounds normal. No respiratory distress. She has no wheezes. She has no rales.  Musculoskeletal: She exhibits no edema.  Neurological: She is alert and oriented to person, place, and time.  Skin: Skin is warm and dry. No rash noted. She is not diaphoretic. No erythema.    Psychiatric: She has a normal mood and affect. Her behavior is normal.  Well groomed, good eye contact, normal speech and thoughts  Nursing note and vitals reviewed.  Results for orders placed or performed in visit on 05/03/16  Lipid panel  Result Value Ref Range   Cholesterol, Total 214 (H) 100 - 199 mg/dL   Triglycerides 160 (H) 0 - 149 mg/dL   HDL 34 (L) >39 mg/dL   VLDL Cholesterol Cal 32 5 - 40 mg/dL   LDL Calculated 148 (H) 0 - 99 mg/dL   Chol/HDL Ratio 6.3 (H) 0.0 - 4.4 ratio  Hepatic function panel  Result Value Ref Range   Total Protein 6.8 6.0 - 8.5 g/dL   Albumin 4.3 3.5 - 5.5 g/dL   Bilirubin Total 0.3 0.0 - 1.2 mg/dL   Bilirubin, Direct 0.09 0.00 - 0.40 mg/dL   Alkaline Phosphatase 95 39 - 117 IU/L   AST 38 0 - 40 IU/L   ALT 66 (H) 0 - 32 IU/L      Assessment & Plan:   Problem List Items Addressed This Visit    Morbid obesity with BMI of 40.0-44.9, adult ([place])    Chronic weight gain over past 2 years following MI and depression, recent still gradual wt gain. - Elevated A1c 5.6 in past, concern possible progression to Pre-DM vs DM  Plan: 1. Counseling on lifestyle modifications today improve diet closer to DM diet, low carb, low sugar, inc water less soda, start regular exercise 2. Check labs A1c, TSH, Vitamin B12/D in 3 months for Annual Physical 3. Follow-up wt progress, if still not improving consider referral to Wt loss Management vs Obesity Clinic vs possible trial on Contrave (depending on anxiety/depression status)      Anxiety and depression - Primary    Improved overall on SSRI, now worse x 2 days off SSRI. Likely underlying factor for chronic fatigue also with poor sleep suspected insomnia is secondary to anxiety/mood. -GAD7: 4, not difficult / PHQ9: 10 - No failed meds. No psychiatry / therapy  Plan: 1. Refilled Escitalopram 10mg  daily - considered dose inc to 20mg  to better control symptoms, however patient interested to possibly taper off in 6  months with changed / improved life stressors. However discussed she may need indefinitely if helping vs may need titrate dose to 20 in future 2. Advised recommend therapy / counseling in future - will given handout locations/names if need 3. Follow-up 3 mo annual phys / 6 months depression/anxiety, med adjust, GAD7/PHQ9, consider taper off      Relevant Medications   escitalopram (LEXAPRO) 10 MG tablet      Meds ordered this encounter  Medications  . escitalopram (LEXAPRO) 10 MG tablet    Sig: Take 1 tablet (10 mg total) by mouth daily.    Dispense:  30 tablet    Refill:  11    Follow up plan: Return in about 3 months (around 09/19/2016) for Annual Physical.  [name], DO [facility] 06/19/2016, 10:57 PM

## 2016-06-19 NOTE — Assessment & Plan Note (Signed)
Chronic weight gain over past 2 years following MI and depression, recent still gradual wt gain. - Elevated A1c 5.6 in past, concern possible progression to Pre-DM vs DM  Plan: 1. Counseling on lifestyle modifications today improve diet closer to DM diet, low carb, low sugar, inc water less soda, start regular exercise 2. Check labs A1c, TSH, Vitamin B12/D in 3 months for Annual Physical 3. Follow-up wt progress, if still not improving consider referral to Wt loss Management vs Obesity Clinic vs possible trial on Contrave (depending on anxiety/depression status)

## 2016-06-19 NOTE — Assessment & Plan Note (Signed)
Improved overall on SSRI, now worse x 2 days off SSRI. Likely underlying factor for chronic fatigue also with poor sleep suspected insomnia is secondary to anxiety/mood. -GAD7: 4, not difficult / PHQ9: 10 - No failed meds. No psychiatry / therapy  Plan: 1. Refilled Escitalopram 10mg  daily - considered dose inc to 20mg  to better control symptoms, however patient interested to possibly taper off in 6 months with changed / improved life stressors. However discussed she may need indefinitely if helping vs may need titrate dose to 20 in future 2. Advised recommend therapy / counseling in future - will given handout locations/names if need 3. Follow-up 3 mo annual phys / 6 months depression/anxiety, med adjust, GAD7/PHQ9, consider taper off

## 2016-06-19 NOTE — Patient Instructions (Addendum)
Thank you for coming to the clinic today.  1. Refilled Lexapro 10mg  daily for now, we can consider tapering off and discontinuing in 6 months if interested  Please review any hormone treatment with your Cardiologist  Eat at least 3 meals and 1-2 snacks per day (don't skip breakfast).  Aim for no more than 5 hours between eating. - Tip: If you go >5 hours without eating and become very hungry, your body will supply it's own resources temporarily and you can gain extra weight when you eat.  The 5 Minute Rule of Exercise - Promise yourself to at least do 5 minutes of exercise (make sure you time it), and if at the end of 5 minutes (this is the hardest part of the work-out), if you still feel like you want stop (or not motivated to continue) then allow yourself to stop. Otherwise, more often than not you will feel encouraged that you can continue for a little while longer or even more!   Diet Recommendations for Preventing Diabetes   Reduce Starchy (carb) foods include: Bread, rice, pasta, potatoes, corn, crackers, bagels, muffins, all baked goods.   Continue to eat Protein foods include: Meat, fish, poultry, eggs, dairy foods, and beans such as pinto and kidney beans (beans also provide carbohydrate).   1. Eat at least 3 meals and 1-2 snacks per day. Never go more than 4-5 hours while awake without eating.   2. Limit starchy foods to TWO per meal and ONE per snack. ONE portion of a starchy  food is equal to the following:   - ONE slice of bread (or its equivalent, such as half of a hamburger bun).   - 1/2 cup of a "scoopable" starchy food such as potatoes or rice.   - 1 OUNCE (28 grams) of starchy snacks (crackers or pretzels, look on label).   - 15 grams of carbohydrate as shown on food label.   3. Both lunch and dinner should include a protein food, a carb food, and vegetables.   - Obtain twice as many veg's as protein or carbohydrate foods for both lunch and dinner.   - Try to keep  frozen veg's on hand for a quick vegetable serving.     - Fresh or frozen veg's are best.   4. Breakfast should always include protein.      You will be due for NON FASTING BLOOD WORK (no food or drink after midnight before, only water or coffee without cream/sugar on the morning of)  - Please go ahead and schedule a "Lab Only" visit in the morning at the clinic for lab draw in 3 months before next Annual Physical - Make sure Lab Only appointment is at least 1-2 weeks before your next appointment, so that results will be available  For Lab Results, once available within 2-3 days of blood draw, you can can log in to MyChart online to view your results and a brief explanation. Also, we can discuss results at next follow-up visit.  Please schedule a follow-up appointment with Dr. Parks Ranger in 3 months for Annual Physical  Then 3 more months in 11/2016 for Depression/Anxiety PHQ/GAD, med adjust taper off Lexapro possibly  If you have any other questions or concerns, please feel free to call the clinic or send a message through [place]. You may also schedule an earlier appointment if necessary.  [name], DO [place]

## 2016-08-04 IMAGING — CR DG CHEST 1V PORT
1 series · 1 of 1 positions shown · non-contrast
Comparison: None.

CLINICAL DATA: Left chest, jaw, and arm pain today.

EXAM:
PORTABLE CHEST - 1 VIEW

[portable]
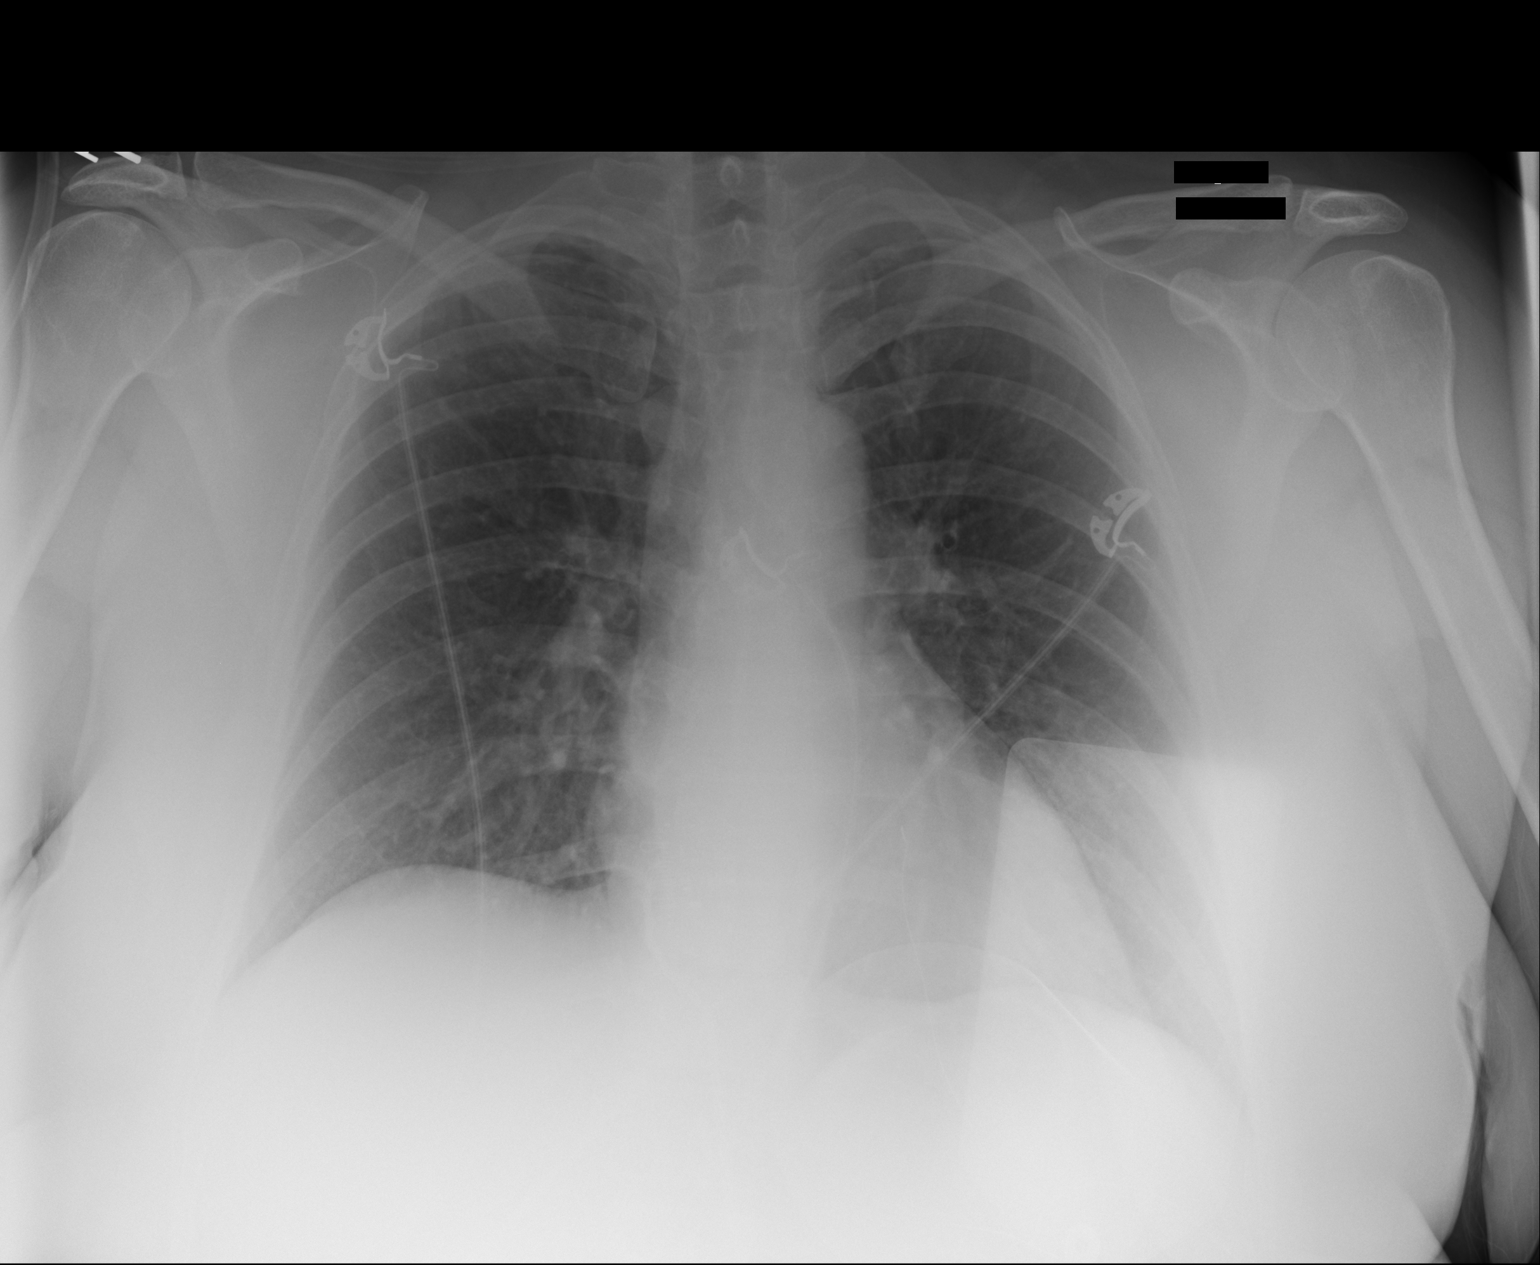

[1 of 1 positions shown; findings below may reference images not displayed]

FINDINGS: The heart size and mediastinal contours are within normal limits.
Both lungs are clear. The visualized skeletal structures are
unremarkable.
IMPRESSION: No active disease.

## 2016-08-08 ENCOUNTER — Other Ambulatory Visit: Payer: BLUE CROSS/BLUE SHIELD

## 2016-08-29 ENCOUNTER — Encounter: Payer: Self-pay | Admitting: Obstetrics and Gynecology

## 2016-08-29 ENCOUNTER — Ambulatory Visit (INDEPENDENT_AMBULATORY_CARE_PROVIDER_SITE_OTHER): Payer: BLUE CROSS/BLUE SHIELD | Admitting: Obstetrics and Gynecology

## 2016-08-29 VITALS — BP 124/78 | Ht 65.0 in | Wt 255.0 lb

## 2016-08-29 DIAGNOSIS — N921 Excessive and frequent menstruation with irregular cycle: Secondary | ICD-10-CM | POA: Insufficient documentation

## 2016-08-29 DIAGNOSIS — R102 Pelvic and perineal pain: Secondary | ICD-10-CM

## 2016-08-29 NOTE — Progress Notes (Signed)
Obstetrics & Gynecology Office Visit   Chief Complaint  Patient presents with  . Menstrual Problem  . Pelvic Pain   History of Present Illness: 51 y.o. G36P3003 female who presents with recent episodes of bleeding after having no bleeding in a long time.   She has a history of endometrial ablation in approximately 2012.  She had an episode of bleeding and pelvic pain in 2016. The gynecologic workup was normal.  However, she believes that was related to kidney stones.  A couple of weeks ago she noted some light pink vaginal bleeding and some slight cramping.  Then her bleeding stopped.  Four days ago she began having heavy vaginal bleeding associated with severe lower abdominal and pelvic pain.  She states that she could not get out of bed three days ago and her symptoms began to resolve two days ago.  Since that time she has had mild dark-brown spotting with much less pain.  She states that the pain was like a contractions and having a baby.  The pain did not radiate and it was severe.  She took ibuprofen 600 mg every 6-8 hours and this seemed to help with the pain.  Nothing seemed to make the pain worse.  She felt nauseated with the pain. That was her only associated symptom.  She denies weight loss (has gained weight), denies bloating and constipation. She has noted some early satiety.  Denies any inciting event.  She denies current hot flashes and other symptoms of menopause. She states that she used to have these symptoms and they started when she was in her late 82's.    Past Medical History:  Diagnosis Date  . Abnormal uterine bleeding   . Heart attack ([place]) 03/2014  . Heart disease   . History of C-section   . HLD (hyperlipidemia)   . Hx of heart artery stent   . Hypertension     Past Surgical History:  Procedure Laterality Date  . CARDIAC SURGERY    . CESAREAN SECTION    . CHOLECYSTECTOMY    . CYSTOSCOPY WITH STENT PLACEMENT Left 11/01/2015   Procedure: CYSTOSCOPY WITH STENT  PLACEMENT;  Surgeon: Hollice Espy, MD;  Location: [facility] ORS;  Service: Urology;  Laterality: Left;  . CYSTOSCOPY/RETROGRADE/URETEROSCOPY Bilateral 11/01/2015   Procedure: CYSTOSCOPY/RETROGRADE/URETEROSCOPY;  Surgeon: Hollice Espy, MD;  Location: [facility] ORS;  Service: Urology;  Laterality: Bilateral;  . DILATION AND CURETTAGE OF UTERUS  2011  . ENDOMETRIAL ABLATION  2011  . LEFT HEART CATHETERIZATION WITH CORONARY ANGIOGRAM N/A 04/06/2014   Procedure: LEFT HEART CATHETERIZATION WITH CORONARY ANGIOGRAM;  Surgeon: Jettie Booze, MD;  Location: [facility] CATH LAB;  Service: Cardiovascular;  Laterality: N/A;    Gynecologic History: Patient's last menstrual period was 08/15/2016.  Obstetric History: [id number]  Family History  Problem Relation Age of Onset  . Heart attack Father 59       died of MI at age 78  . Hypertension Mother   . Hyperlipidemia Mother   . Kidney Stones Sister   . Kidney Stones Brother   . Stroke Paternal Grandfather   . Liver cancer Maternal Grandmother   . Kidney disease Neg Hx   . Bladder Cancer Neg Hx     Social History   Social History  . Marital status: Married    Spouse name: N/A  . Number of children: N/A  . Years of education: N/A   Occupational History  . Not on file.   Social History Main Topics  . Smoking  status: Former Research scientist (life sciences)  . Smokeless tobacco: Never Used     Comment: quit 20 years  . Alcohol use No  . Drug use: No  . Sexual activity: Yes    Birth control/ protection: None   Other Topics Concern  . Not on file   Social History Narrative  . No narrative on file    Allergies  Allergen Reactions  . Latex Dermatitis  . Penicillins Rash    Prior to Admission medications   Medication Sig Start Date End Date Taking? Authorizing Provider  aspirin 81 MG chewable tablet Chew 1 tablet (81 mg total) by mouth daily. 04/09/14   Consuelo Pandy, PA-C  clopidogrel (PLAVIX) 75 MG tablet TAKE 1 TABLET (75 MG TOTAL) BY MOUTH DAILY. 11/15/15    Jettie Booze, MD  dicyclomine (BENTYL) 10 MG capsule Take 1 capsule (10 mg total) by mouth 4 (four) times daily -  before meals and at bedtime. 04/11/16   Karamalegos, Devonne Doughty, DO  escitalopram (LEXAPRO) 10 MG tablet Take 1 tablet (10 mg total) by mouth daily. 06/19/16   Karamalegos, Devonne Doughty, DO  isosorbide mononitrate (IMDUR) 30 MG 24 hr tablet TAKE 1 TABLET (30 MG TOTAL) BY MOUTH DAILY. 10/28/15   Jettie Booze, MD  lisinopril (PRINIVIL,ZESTRIL) 5 MG tablet Take 1 tablet (5 mg total) by mouth daily. 01/12/16   Jettie Booze, MD  metoprolol tartrate (LOPRESSOR) 25 MG tablet Take 1 tablet (25 mg total) by mouth 2 (two) times daily. 11/08/15   Jettie Booze, MD  nitroGLYCERIN (NITROSTAT) 0.4 MG SL tablet Place 1 tablet (0.4 mg total) under the tongue every 5 (five) minutes x 3 doses as needed for chest pain. 04/09/14   Lyda Jester M, PA-C  Probiotic Product (ALIGN) 4 MG CAPS Take 4 mg by mouth 2 (two) times daily.    [provider]  rosuvastatin (CRESTOR) 20 MG tablet Take 1 tablet (20 mg total) by mouth daily. 05/09/16 08/07/16  Jettie Booze, MD    Review of Systems  Constitutional: Negative.  Negative for chills, diaphoresis, fever, malaise/fatigue and weight loss (patient has experience weight gain).  HENT: Negative.   Eyes: Negative.   Respiratory: Negative.   Cardiovascular: Negative.  Negative for chest pain, palpitations and leg swelling.  Gastrointestinal: Positive for abdominal pain (lower mid abdomen) and nausea (with pain). Negative for blood in stool, constipation, diarrhea, heartburn and vomiting.  Genitourinary: Negative.  Negative for dysuria, flank pain, frequency, hematuria and urgency.  Musculoskeletal: Negative.   Skin: Negative.   Neurological: Negative.  Negative for weakness.  Psychiatric/Behavioral: Negative.      Physical Exam BP 124/78   Ht 5\' 5"  (1.651 m)   Wt 255 lb (115.7 kg)   LMP 08/15/2016   BMI 42.43  kg/m  Patient's last menstrual period was 08/15/2016. Physical Exam  Constitutional: She is oriented to person, place, and time. She appears well-developed and well-nourished. No distress.  Genitourinary: Pelvic exam was performed with patient supine. There is no rash, tenderness or lesion on the right labia. There is no rash, tenderness or lesion on the left labia. Vagina exhibits no lesion. There is bleeding (scant dark-brown blood in vaginal vault) in the vagina. No erythema or tenderness in the vagina. No foreign body in the vagina. No signs of injury around the vagina. Right adnexum does not display mass, does not display tenderness and does not display fullness. Left adnexum does not display mass, does not display tenderness and does not  display fullness. Cervix does not exhibit motion tenderness, lesion, discharge or polyp.   Uterus is tender (mild ttp) and mobile. Uterus is not enlarged.  Genitourinary Comments: Exam limited by body habitus  HENT:  Head: Normocephalic and atraumatic.  Eyes: EOM are normal. No scleral icterus.  Neck: Normal range of motion. Neck supple. No thyromegaly present.  Cardiovascular: Normal rate, regular rhythm and normal heart sounds.   Pulmonary/Chest: Effort normal and breath sounds normal. No respiratory distress. She has no wheezes. She has no rales.  Abdominal: Soft. Bowel sounds are normal. She exhibits no distension and no mass. There is no tenderness. There is no rebound and no guarding.  Exam limited by body habitus  Musculoskeletal: Normal range of motion. She exhibits no edema.  Lymphadenopathy:    She has no cervical adenopathy.  Neurological: She is alert and oriented to person, place, and time. No cranial nerve deficit.  Skin: Skin is warm and dry. No erythema.  Psychiatric: She has a normal mood and affect. Her behavior is normal. Judgment normal.   Endometrial Biopsy After discussion with the patient regarding her abnormal uterine bleeding I  recommended that she proceed with an endometrial biopsy for further diagnosis. The risks, benefits, alternatives, and indications for an endometrial biopsy were discussed with the patient in detail. She understood the risks including infection, bleeding, cervical laceration and uterine perforation.  Verbal consent was obtained.   PROCEDURE NOTE:  [place] endometrial biopsy was performed using aseptic technique with iodine preparation.  The uterus was sounded to a length of 7 cm.  Adequate sampling was obtained with minimal blood loss.  The patient tolerated the procedure well.  Disposition will be pending pathology.  Female chaperone present for pelvic and breast  portions of the physical exam  Assessment: 51 y.o. G3P3003 female with menorrhagia and dysmenorrhea versus postmenopausal bleeding with associated pain.   Plan: Problem List Items Addressed This Visit    Menorrhagia with irregular cycle - Primary   Relevant Orders   Follicle stimulating hormone   Hgb A1c w/o eAG   IGP,CtNg,AptimaHPV,rfx16/18,45   TSH   Pathology   US Transvaginal Non-OB     Discussed that in the setting of having had an endometrial ablation, workup for her bleeding may be more complicated.  She has a history of symptoms consistent with the menopausal transition, but none currently. Will obtain an [place] to try to help establish menopausal status.  Will order some labs associated with heavy bleeding.  Discussed potential limitation of endometrial biopsy in the setting of having had an endometrial ablation.  Will obtain pelvic ultrasound as a next step.  Pap smear attempted. Last one was two years ago and was normal.  Discussed differential for both postmenopausal bleeding and menopausal transition bleeding.    Prentice Docker, MD 08/29/2016 3:17 PM

## 2016-08-30 LAB — FOLLICLE STIMULATING HORMONE: FSH: 10.7 m[IU]/mL

## 2016-08-30 LAB — HGB A1C W/O EAG: Hgb A1c MFr Bld: 5.5 % (ref 4.8–5.6)

## 2016-08-30 LAB — TSH: TSH: 5.31 u[IU]/mL — AB (ref 0.450–4.500)

## 2016-08-31 LAB — PATHOLOGY

## 2016-09-01 ENCOUNTER — Telehealth: Payer: Self-pay | Admitting: Obstetrics and Gynecology

## 2016-09-01 ENCOUNTER — Ambulatory Visit (INDEPENDENT_AMBULATORY_CARE_PROVIDER_SITE_OTHER): Payer: BLUE CROSS/BLUE SHIELD

## 2016-09-01 ENCOUNTER — Encounter: Payer: Self-pay | Admitting: Obstetrics and Gynecology

## 2016-09-01 ENCOUNTER — Ambulatory Visit (INDEPENDENT_AMBULATORY_CARE_PROVIDER_SITE_OTHER): Payer: BLUE CROSS/BLUE SHIELD | Admitting: Obstetrics and Gynecology

## 2016-09-01 VITALS — BP 126/74 | Ht 65.0 in | Wt 233.0 lb

## 2016-09-01 DIAGNOSIS — N921 Excessive and frequent menstruation with irregular cycle: Secondary | ICD-10-CM | POA: Diagnosis not present

## 2016-09-01 NOTE — Progress Notes (Signed)
Gynecology Ultrasound Follow Up   Chief Complaint  Patient presents with  . Follow-up   History of Present Illness: Patient is a 51 y.o. female who presents today for ultrasound evaluation of  . Menorrhagia  Ultrasound demonstrates the following findings Adnexa: ovaries not visualized due to body habitus and bowel gas.  Uterus: anteverted, globular with endometrial stripe  12.1 mm Additional: heterogeneous texture to uterus and globular  Past Medical History:  Diagnosis Date  . Abnormal uterine bleeding   . Heart attack ([place]) 03/2014  . Heart disease   . History of C-section   . HLD (hyperlipidemia)   . Hx of heart artery stent   . Hypertension     Past Surgical History:  Procedure Laterality Date  . CARDIAC SURGERY    . CESAREAN SECTION    . CHOLECYSTECTOMY    . CYSTOSCOPY WITH STENT PLACEMENT Left 11/01/2015   Procedure: CYSTOSCOPY WITH STENT PLACEMENT;  Surgeon: Hollice Espy, MD;  Location: [facility] ORS;  Service: Urology;  Laterality: Left;  . CYSTOSCOPY/RETROGRADE/URETEROSCOPY Bilateral 11/01/2015   Procedure: CYSTOSCOPY/RETROGRADE/URETEROSCOPY;  Surgeon: Hollice Espy, MD;  Location: [facility] ORS;  Service: Urology;  Laterality: Bilateral;  . DILATION AND CURETTAGE OF UTERUS  2011  . ENDOMETRIAL ABLATION  2011  . LEFT HEART CATHETERIZATION WITH CORONARY ANGIOGRAM N/A 04/06/2014   Procedure: LEFT HEART CATHETERIZATION WITH CORONARY ANGIOGRAM;  Surgeon: Jettie Booze, MD;  Location: [facility] CATH LAB;  Service: Cardiovascular;  Laterality: N/A;    Gynecologic History:  Patient's last menstrual period was 08/15/2016.  Family History  Problem Relation Age of Onset  . Heart attack Father 38       died of MI at age 67  . Hypertension Mother   . Hyperlipidemia Mother   . Kidney Stones Sister   . Kidney Stones Brother   . Stroke Paternal Grandfather   . Liver cancer Maternal Grandmother   . Kidney disease Neg Hx   . Bladder Cancer Neg Hx     Social History    Social History  . Marital status: Married    Spouse name: N/A  . Number of children: N/A  . Years of education: N/A   Occupational History  . Not on file.   Social History Main Topics  . Smoking status: Former Research scientist (life sciences)  . Smokeless tobacco: Never Used     Comment: quit 20 years  . Alcohol use No  . Drug use: No  . Sexual activity: Yes    Birth control/ protection: None   Other Topics Concern  . Not on file   Social History Narrative  . No narrative on file    Allergies  Allergen Reactions  . Latex Dermatitis  . Penicillins Rash      Medication Sig Start Date End Date Taking? Authorizing Provider  aspirin 81 MG chewable tablet Chew 1 tablet (81 mg total) by mouth daily. 04/09/14   Consuelo Pandy, PA-C  clopidogrel (PLAVIX) 75 MG tablet TAKE 1 TABLET (75 MG TOTAL) BY MOUTH DAILY. 11/15/15   Jettie Booze, MD  dicyclomine (BENTYL) 10 MG capsule Take 1 capsule (10 mg total) by mouth 4 (four) times daily -  before meals and at bedtime. 04/11/16   Karamalegos, Devonne Doughty, DO  escitalopram (LEXAPRO) 10 MG tablet Take 1 tablet (10 mg total) by mouth daily. 06/19/16   Karamalegos, Devonne Doughty, DO  isosorbide mononitrate (IMDUR) 30 MG 24 hr tablet TAKE 1 TABLET (30 MG TOTAL) BY MOUTH DAILY. 10/28/15   Larae Grooms  S, MD  lisinopril (PRINIVIL,ZESTRIL) 5 MG tablet Take 1 tablet (5 mg total) by mouth daily. 01/12/16   Jettie Booze, MD  metoprolol tartrate (LOPRESSOR) 25 MG tablet Take 1 tablet (25 mg total) by mouth 2 (two) times daily. 11/08/15   Jettie Booze, MD  nitroGLYCERIN (NITROSTAT) 0.4 MG SL tablet Place 1 tablet (0.4 mg total) under the tongue every 5 (five) minutes x 3 doses as needed for chest pain. 04/09/14   Lyda Jester M, PA-C  Probiotic Product (ALIGN) 4 MG CAPS Take 4 mg by mouth 2 (two) times daily.    [provider]  rosuvastatin (CRESTOR) 20 MG tablet Take 1 tablet (20 mg total) by mouth daily. 05/09/16 08/07/16  Jettie Booze, MD    Physical Exam BP 126/74   Ht 5\' 5"  (1.651 m)   Wt 233 lb (105.7 kg)   LMP 08/15/2016   BMI 38.77 kg/m    General: NAD HEENT: normocephalic, anicteric Pulmonary: No increased work of breathing Extremities: no edema, erythema, or tenderness Neurologic: Grossly intact, normal gait Psychiatric: mood appropriate, affect full   Assessment: 51 y.o. [id number] with menorrhagia, status post endometrial ablation  Plan: Discussed that she is likely premenopausal or in he rmenopausal transition.  However, could not rule out cancer.  She also has some changes consistent with possible adenomyosis.  Treatment options for bleeding and pain are limited due to her history of endometrial ablation.  Also, given ultrasound findings, she might have adenomyosis.  This also restricts treatment options.  Discussed attempting to treat with observation, hormonal medication, and surgery with hysterectomy.  She readily agrees to hysterectomy.  Given her medical history (recent myocardial infarction), will need cardiac clearance first.  Have recommended that she get cardiac clearance from her cardiologist first.  If cleared, will schedule for total laparoscopic hysterectomy, bilateral salpingectomy.  Discussed potential surgical risks given her cardiac history.  20 minutes spent in face to face discussion with > 50% spent in counseling and management of her menorrhagia.   Prentice Docker, MD 09/04/2016 9:02 PM

## 2016-09-01 NOTE — Telephone Encounter (Signed)
Pt is calling to speak with Dr. Margart Sickles Nurse . Please [phone]

## 2016-09-02 LAB — IGP,CTNG,APTIMAHPV,RFX16/18,45
CHLAMYDIA, NUC. ACID AMP: NEGATIVE
GONOCOCCUS BY NUCLEIC ACID AMP: NEGATIVE
HPV APTIMA: NEGATIVE
PAP Smear Comment: 0

## 2016-09-04 IMAGING — CT CT ANGIO CHEST
2 of 6 series · 18 of 36 positions shown · IV contrast (APPLIED)
Comparison: Chest x-ray earlier today.

CLINICAL DATA: Difficulty breathing and chest tightness. History of
recent myocardial infarction and coronary intervention.

EXAM:
CT ANGIOGRAPHY CHEST WITH CONTRAST
TECHNIQUE: Multidetector CT imaging of the chest was performed using the
standard protocol during bolus administration of intravenous
contrast. Multiplanar CT image reconstructions and MIPs were
obtained to evaluate the vascular anatomy.
CONTRAST:  100 mL Omnipaque 350 IV

[Series 6: pe 1.0 thins · axial · 0.68mm/px · z∈[+512,+742]mm · 17 of 261 slices shown]
[im 15/261  lung]
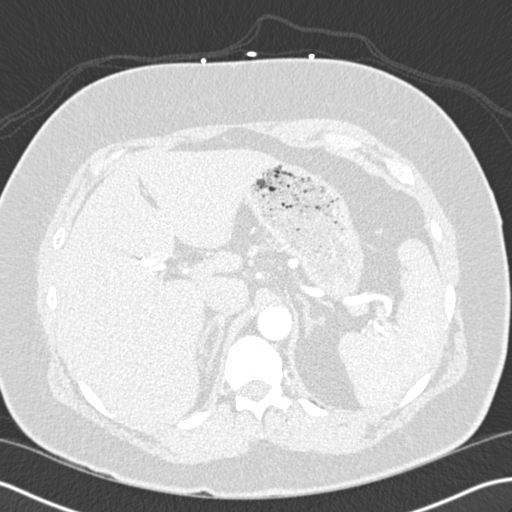
[im 29/261  mediastinal]
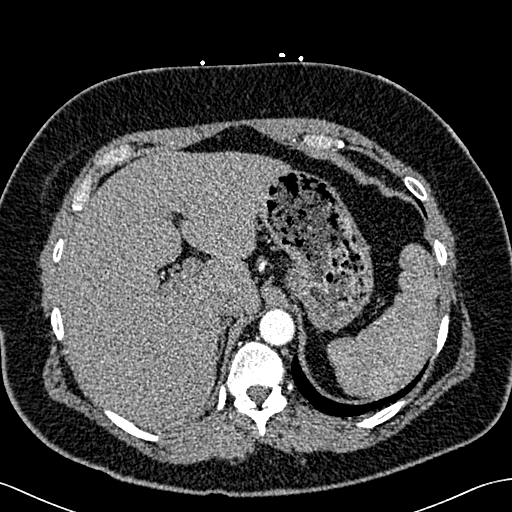
[im 44/261  lung]
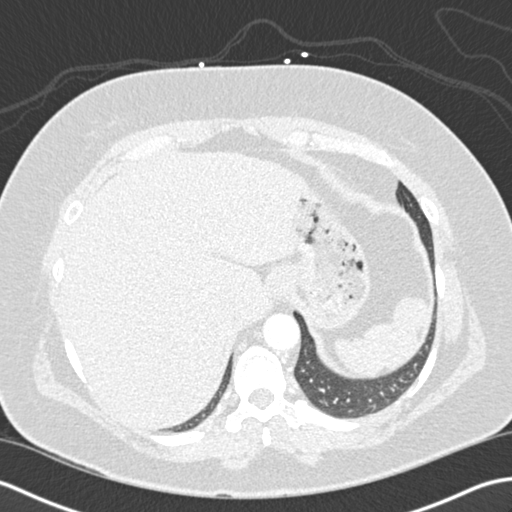
[im 58/261  mediastinal]
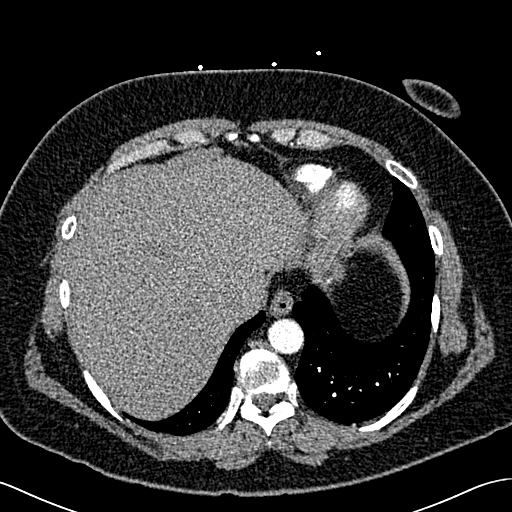
[im 73/261  lung]
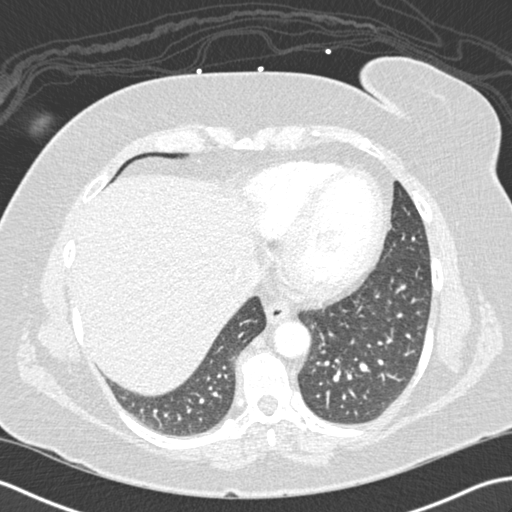
[im 87/261  mediastinal]
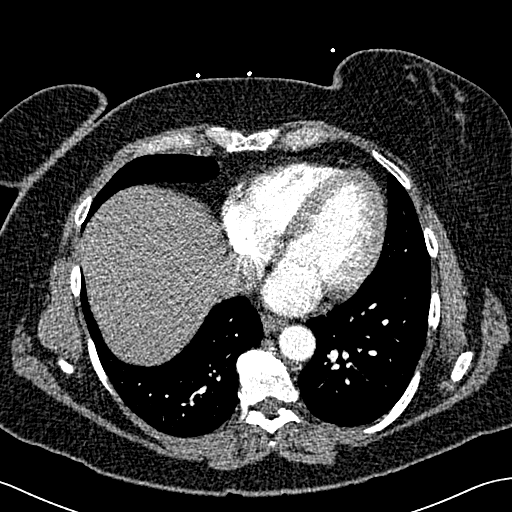
[im 102/261  lung]
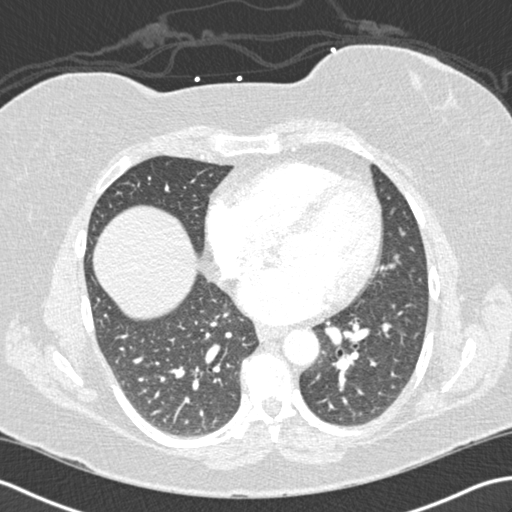
[im 116/261  mediastinal]
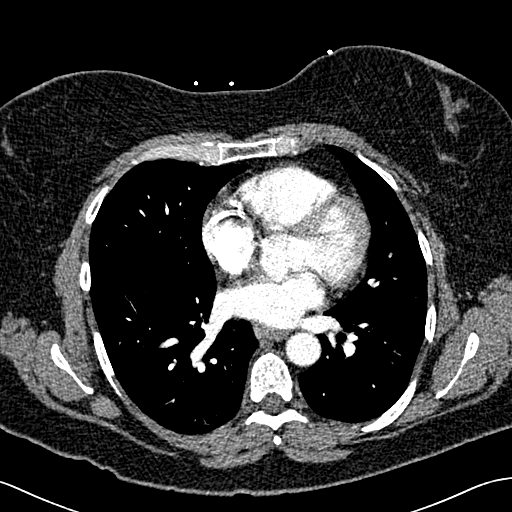
[im 131/261  lung]
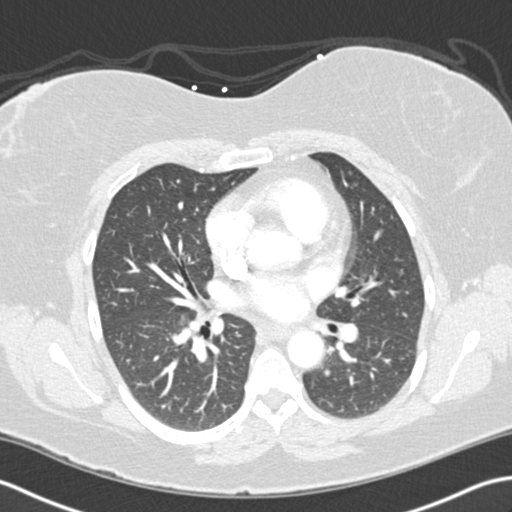
[im 145/261  mediastinal]
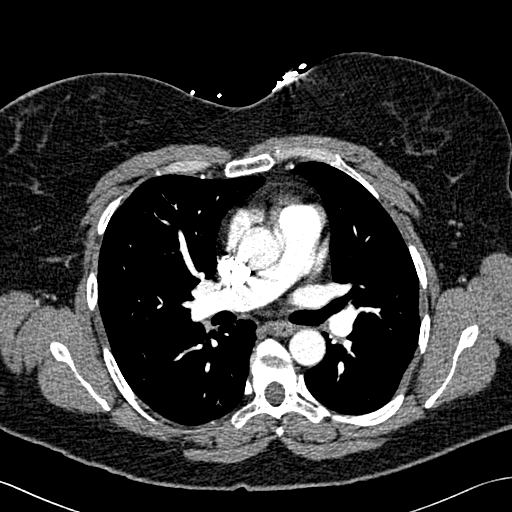
[im 159/261  lung]
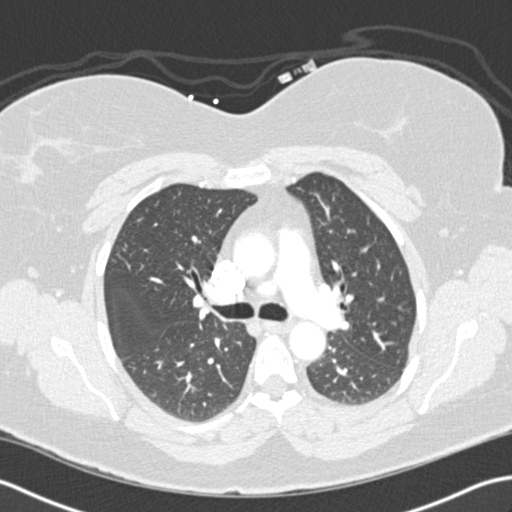
[im 174/261  mediastinal]
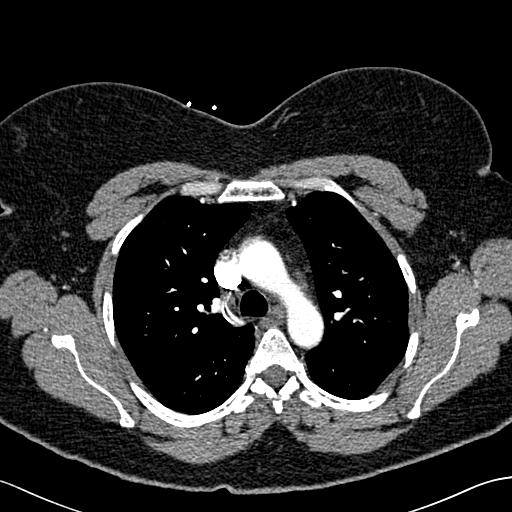
[im 188/261  lung]
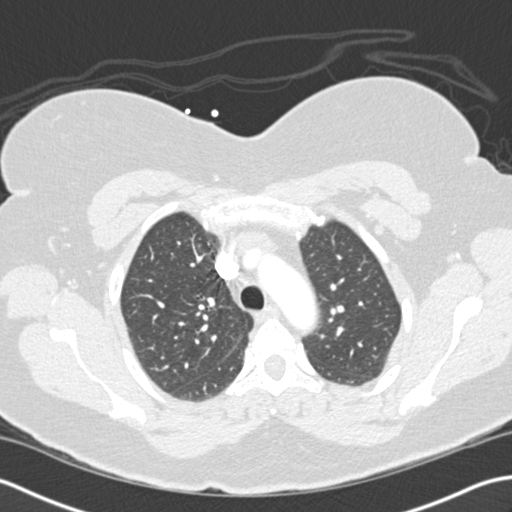
[im 203/261  mediastinal]
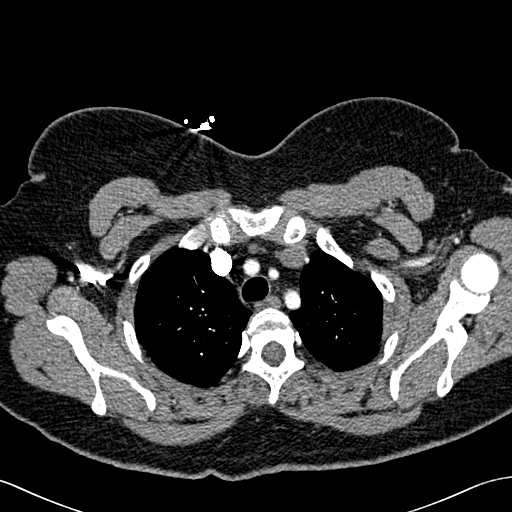
[im 217/261  lung]
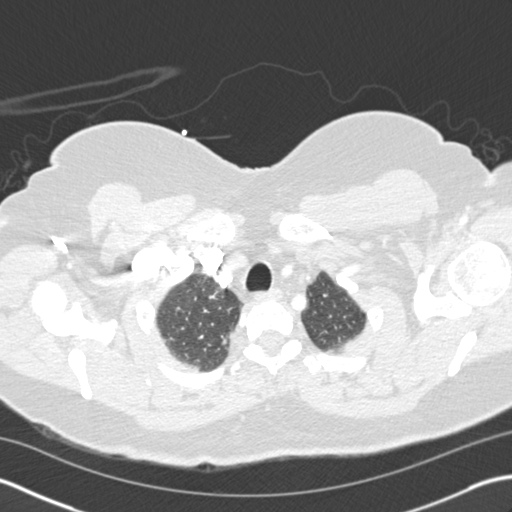
[im 232/261  mediastinal]
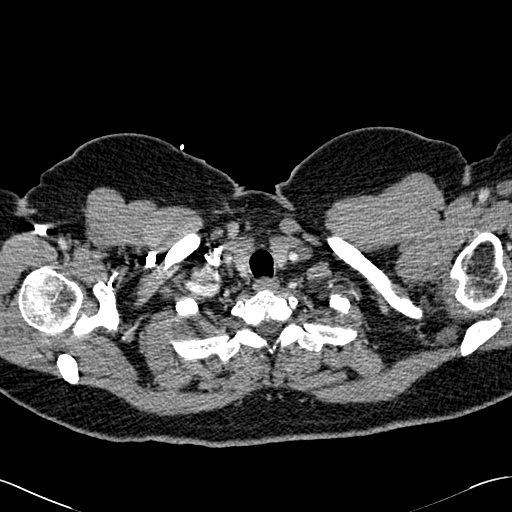
[im 246/261  lung]
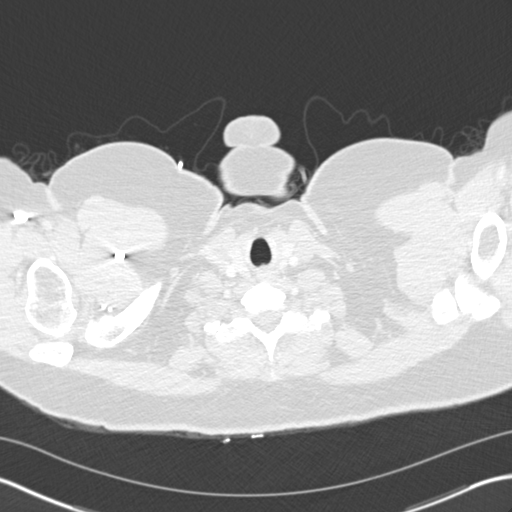

[Series 8: cor pe 2.0 mpr · coronal · 0.51mm/px · 1 of 148 slices shown]
[im 74/148  mediastinal]
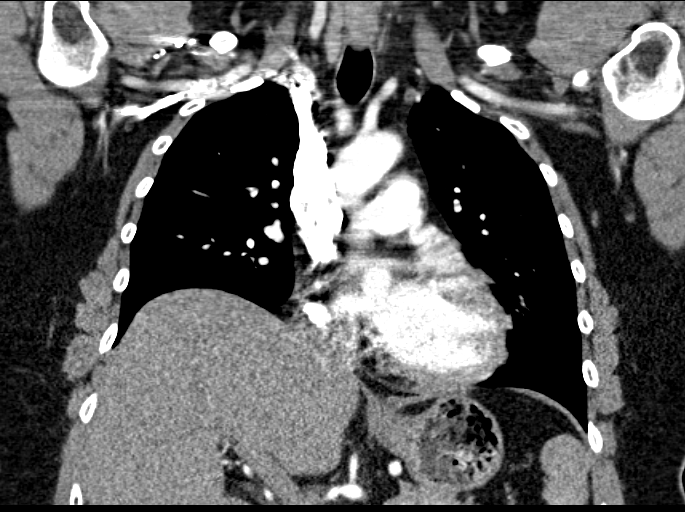

[18 of 36 positions shown; findings below may reference images not displayed]

FINDINGS: The pulmonary arteries are well opacified. There is no evidence of
pulmonary embolism. The thoracic aorta is also well opacified and
shows no evidence of aneurysmal disease or dissection. Proximal
great vessels show normal patency and branching pattern.

The heart size is normal. No pleural or pericardial fluid is
identified.

Lungs show no evidence of infiltrates, nodules or edema. No
pneumothorax is identified. No evidence of airway obstruction. No
enlarged lymph nodes are seen. Bony structures are unremarkable.
Visualized upper abdomen is normal.

Review of the MIP images confirms the above findings.
IMPRESSION: No evidence of pulmonary embolism or other acute findings in the
chest.

## 2016-09-04 IMAGING — CR DG CHEST 1V PORT
1 series · 1 of 1 positions shown · non-contrast
Comparison: Portable exam 3262 hours compared 04/06/2014

CLINICAL DATA: RIGHT-side chest and back pain beginning this
morning, difficulty taking a deep breath, history MI 3 weeks ago,
with coronary stenting

EXAM:
PORTABLE CHEST - 1 VIEW

[ap]
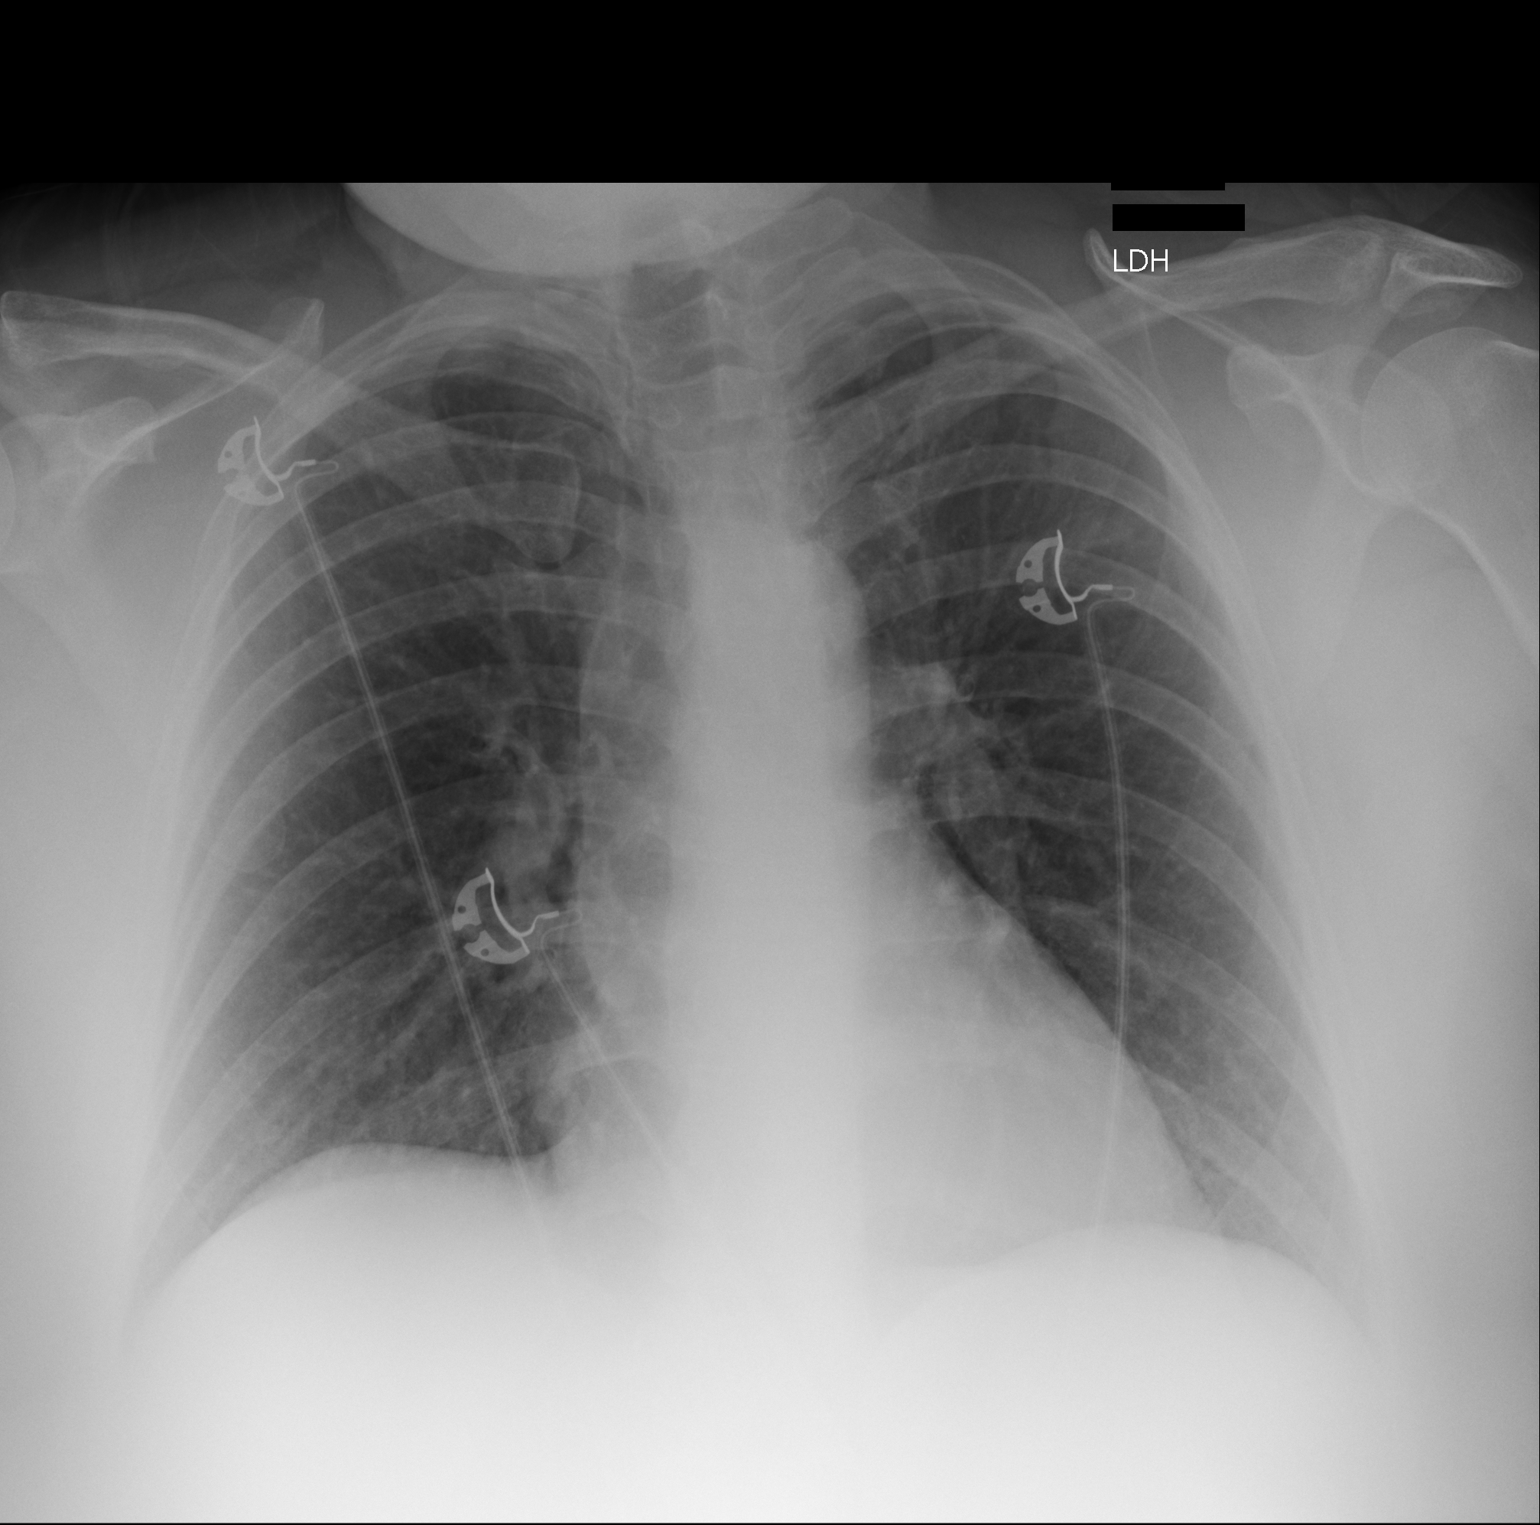

[1 of 1 positions shown; findings below may reference images not displayed]

FINDINGS: Normal heart size, mediastinal contours, and pulmonary vascularity.

Lungs clear.

No pleural effusion or pneumothorax.

Bones unremarkable.
IMPRESSION: No acute abnormalities.

## 2016-09-04 NOTE — Telephone Encounter (Signed)
Pt is calling back due to a missed call. Please advise

## 2016-09-04 NOTE — Telephone Encounter (Signed)
Please let me know when pt callsback ?

## 2016-09-11 ENCOUNTER — Telehealth: Payer: Self-pay

## 2016-09-11 NOTE — Telephone Encounter (Signed)
Pt calling trying to get her hysterectomy set up I believe. She is aware I do not handle this and I will send to [place] who will call her back

## 2016-09-12 NOTE — Telephone Encounter (Signed)
On 9/4 I do not have an assistant for surgery.  Would you mind working with her to schedule this on a date when I have an Environmental consultant in the [place]?  Thank you

## 2016-09-18 ENCOUNTER — Telehealth: Payer: Self-pay

## 2016-09-18 ENCOUNTER — Other Ambulatory Visit: Payer: Self-pay | Admitting: Family Medicine

## 2016-09-18 ENCOUNTER — Other Ambulatory Visit: Payer: BLUE CROSS/BLUE SHIELD

## 2016-09-18 DIAGNOSIS — E039 Hypothyroidism, unspecified: Secondary | ICD-10-CM | POA: Diagnosis not present

## 2016-09-18 DIAGNOSIS — R7309 Other abnormal glucose: Secondary | ICD-10-CM | POA: Diagnosis not present

## 2016-09-18 DIAGNOSIS — F329 Major depressive disorder, single episode, unspecified: Secondary | ICD-10-CM | POA: Diagnosis not present

## 2016-09-18 DIAGNOSIS — E782 Mixed hyperlipidemia: Secondary | ICD-10-CM | POA: Diagnosis not present

## 2016-09-18 DIAGNOSIS — F419 Anxiety disorder, unspecified: Secondary | ICD-10-CM | POA: Diagnosis not present

## 2016-09-18 NOTE — Telephone Encounter (Signed)
So doesn't look like Glennon Mac called in anything in the past and we can't call in narcotics.  Ibuprofen 600mg  q6hrs would be my recommendation for any cramping

## 2016-09-18 NOTE — Telephone Encounter (Signed)
See previous task

## 2016-09-18 NOTE — Telephone Encounter (Signed)
Patient is aware of H&P at [facility] on 09/29/16 @ 11:10am w/ Dr. Glennon Mac, Pre-admit Testing afterwards, and OR on 10/10/16. Patient has my ext. Patient needs something for pain, left message on triage this morning, but it may not have been retrieved yet. Please contact patient.

## 2016-09-18 NOTE — Telephone Encounter (Signed)
It looks like surgery is scheduled for 8/31.Marland Kitchenplease advise

## 2016-09-18 NOTE — Telephone Encounter (Signed)
-----   Message from Will Bonnet, MD sent at 09/15/2016  1:35 PM EDT ----- Regarding: Schedule surgery Surgery Booking Request Patient Full Name: Sheri [name]   MRN: [id number]  DOB: August 02, 1965  Surgeon: Prentice Docker, MD  Requested Surgery Date and Time: TBD, schedule when I have an MD assistant Primary Diagnosis and Code: menorrhagia irregular cycle Secondary Diagnosis and Code:  Surgical Procedure: TLH/BS, cystoscopy L&D Notification: No Admission Status: observation Length of Surgery: 2.5 hours Special Case Needs: Clearance from Cardiology H&P: TBD (date) Phone Interview or Office Pre-Admit: pre-admit Interpreter: none Language: english Medical Clearance: Cardiology pending Special Scheduling Instructions: n/a

## 2016-09-18 NOTE — Telephone Encounter (Signed)
Pt aware of AMS message. She states the pain has gone away as of right now.

## 2016-09-18 NOTE — Telephone Encounter (Signed)
Pt is having pain again.  Would SDJ call in something?  [phone]

## 2016-09-19 LAB — TSH: TSH: 5.77 m[IU]/L — AB

## 2016-09-19 LAB — HEMOGLOBIN A1C
HEMOGLOBIN A1C: 5.3 % (ref <5.7)
Mean Plasma Glucose: 105 mg/dL

## 2016-09-19 LAB — VITAMIN B12: VITAMIN B 12: 416 pg/mL (ref 200–1100)

## 2016-09-19 LAB — VITAMIN D 25 HYDROXY (VIT D DEFICIENCY, FRACTURES): VIT D 25 HYDROXY: 22 ng/mL — AB (ref 30–100)

## 2016-09-21 ENCOUNTER — Encounter: Payer: Self-pay | Admitting: Family Medicine

## 2016-09-21 ENCOUNTER — Ambulatory Visit (INDEPENDENT_AMBULATORY_CARE_PROVIDER_SITE_OTHER): Payer: BLUE CROSS/BLUE SHIELD | Admitting: Cardiology

## 2016-09-21 ENCOUNTER — Ambulatory Visit (INDEPENDENT_AMBULATORY_CARE_PROVIDER_SITE_OTHER): Payer: BLUE CROSS/BLUE SHIELD | Admitting: Family Medicine

## 2016-09-21 ENCOUNTER — Other Ambulatory Visit: Payer: Self-pay | Admitting: Family Medicine

## 2016-09-21 ENCOUNTER — Encounter: Payer: Self-pay | Admitting: Cardiology

## 2016-09-21 VITALS — BP 123/77 | HR 74 | Temp 98.3°F | Resp 16 | Ht 65.0 in | Wt 253.0 lb

## 2016-09-21 VITALS — BP 130/90 | HR 74 | Ht 65.0 in | Wt 252.8 lb

## 2016-09-21 DIAGNOSIS — I2510 Atherosclerotic heart disease of native coronary artery without angina pectoris: Secondary | ICD-10-CM | POA: Diagnosis not present

## 2016-09-21 DIAGNOSIS — Z0000 Encounter for general adult medical examination without abnormal findings: Secondary | ICD-10-CM | POA: Diagnosis not present

## 2016-09-21 DIAGNOSIS — Z6841 Body Mass Index (BMI) 40.0 and over, adult: Secondary | ICD-10-CM

## 2016-09-21 DIAGNOSIS — R5382 Chronic fatigue, unspecified: Secondary | ICD-10-CM

## 2016-09-21 DIAGNOSIS — E039 Hypothyroidism, unspecified: Secondary | ICD-10-CM

## 2016-09-21 DIAGNOSIS — Z1239 Encounter for other screening for malignant neoplasm of breast: Secondary | ICD-10-CM

## 2016-09-21 DIAGNOSIS — F329 Major depressive disorder, single episode, unspecified: Secondary | ICD-10-CM

## 2016-09-21 DIAGNOSIS — F419 Anxiety disorder, unspecified: Secondary | ICD-10-CM | POA: Diagnosis not present

## 2016-09-21 DIAGNOSIS — F32A Depression, unspecified: Secondary | ICD-10-CM

## 2016-09-21 DIAGNOSIS — E785 Hyperlipidemia, unspecified: Secondary | ICD-10-CM | POA: Diagnosis not present

## 2016-09-21 DIAGNOSIS — E782 Mixed hyperlipidemia: Secondary | ICD-10-CM

## 2016-09-21 DIAGNOSIS — E669 Obesity, unspecified: Secondary | ICD-10-CM

## 2016-09-21 DIAGNOSIS — E559 Vitamin D deficiency, unspecified: Secondary | ICD-10-CM | POA: Diagnosis not present

## 2016-09-21 MED ORDER — LEVOTHYROXINE SODIUM 25 MCG PO TABS: 25.0000 ug | ORAL_TABLET | Freq: Every day | ORAL | 5 refills | Status: DC

## 2016-09-21 NOTE — Assessment & Plan Note (Addendum)
Not at goal LDL On atorvastatin Followed by Cardiology

## 2016-09-21 NOTE — Assessment & Plan Note (Addendum)
Continued chronic weight gain over past 2 years following MI and depression - A1c normal range, not PreDM - New concern elevated TSH, suspected hypothyroidism  Plan: 1. Counseling on lifestyle modifications again encouraged to continue improve diet, low carb, low sugar, inc water less soda, start regular exercise 2. Treat hypothyroidism 3. Follow-up wt progress, if still not improving consider referral to Wt loss Management vs Obesity Clinic vs possible trial on Contrave (depending on anxiety/depression status)

## 2016-09-21 NOTE — Assessment & Plan Note (Signed)
Uncertain exact etiology, seems multifactorial now new concern with possible hypothyroidism as large contributing factor, with wt gain, poor sleep / insomnia, depression/anxiety, and prior CAD/MI. - Additionally concern for possible OSA, however not having excessive daytime sleepiness, so more consistent with fatigue - Vitamin B12 is normal - Vitamin D is mild low - this could be contributing to fatigue  Plan: 1. Treat hypothyroid 2. Treat Vitamin D - replacement 5k D3 for 12 weeks then 2k 3. Continue manage wt, lifestyle, and mood

## 2016-09-21 NOTE — Patient Instructions (Signed)
Medication Instructions:  Your physician recommends that you continue on your current medications as directed. Please refer to the Current Medication list given to you today.   Labwork: Lipid and Lft today  Testing/Procedures: None ordered  Follow-Up: Your physician wants you to follow-up in: 6 months with Dr.Varanasi You will receive a reminder letter in the mail two months in advance. If you don't receive a letter, please call our office to schedule the follow-up appointment.   Any Other Special Instructions Will Be Listed Below (If Applicable).     If you need a refill on your cardiac medications before your next appointment, please call your pharmacy.

## 2016-09-21 NOTE — Assessment & Plan Note (Addendum)
Mild low 22 May be contributing to fatigue Start therapy Vitamin D3 5,000 iu daily for 12 weeks then down to 2k daily maintenance Re-check 3-6 months

## 2016-09-21 NOTE — Patient Instructions (Addendum)
Thank you for coming to the clinic today.  1.  TSH Thyroid stimulating hormone was elevated 5.77, this is higher than it was 3 weeks ago when checked.  Levothyroxine (Synthroid) 25 mcg daily first thing in morning about 30-1 hour before first meal, take with full glass of water, it should eventually take more effect in 2 weeks, and improve energy, reduce fatigue, promote  Please discuss this with Cardiology today  Vitamin D - 22, low. Recommend starting OTC Vitamin D3 5,000 iu daily for 12 weeks then reduce dose to Vitamin D3 2,000 iu daily for maintenance. Check your MVI to see how much Vitamin D it has in it.  Consider Sleep Apnea, it was on your chart from before. If you have had weight gain this could have triggered a new problem with Obstructive Sleep Apnea or worsened an existing problem.  Need a copy of your Flu Vaccine record from pharmacy.  Please request copy of your Colonoscopy from [facility].  Recommend Mammogram before end of year through Dr Marisue [place] office   Pay attention to awakenings  Sleep Hygiene Recommendations to promote healthy sleep in all patients, especially if symptoms of insomnia are worsening. Due to the nature of sleep rhythms, if your body gets "out of rhythm", it may take some time before your sleep cycle can be "reset".  Please try to follow as many of the following tips as you can, usually there are only a few of these are the primary cause of the problem.  ?To reset your sleep rhythm, go to bed and get up at the same time every day ?Sleep only long enough to feel rested and then get out of bed ?Do not try to force yourself to sleep. If you can't sleep, get out of bed and try again later. ?Avoid naps during the day, unless excessively tired. The more sleeping during the day, then the less sleep your body needs at night.  ?Have coffee, tea, and other foods that have caffeine only in the morning ?Exercise several days a week, but not right before  bed ?If you drink alcohol, prefer to have appropriate drink with one meal, but prefer to avoid alcohol in the evening, and bedtime ?If you smoke, avoid smoking, especially in the evening  ?Avoid watching TV or looking at phones, computers, or reading devices ("e-books") that give off light at least 30 minutes before bed. This artificial light sends "awake signals" to your brain and can make it harder to fall asleep. ?Make your bedroom a comfortable place where it is easy to fall asleep: ? Put up shades or special blackout curtains to block light from outside. ? Use a white noise machine to block noise. ? Keep the temperature cool. ?Try your best to solve or at least address your problems before you go to bed ?Use relaxation techniques to manage stress. Ask your health care provider to suggest some techniques that may work well for you. These may include: ? Breathing exercises. ? Routines to release muscle tension. ? Visualizing peaceful scenes.   Please schedule a Follow-up Appointment to: Return in about 6 weeks (around 11/02/2016) for Thyroid f/u, med adjust, ?OSA.  If you have any other questions or concerns, please feel free to call the clinic or send a message through [place]. You may also schedule an earlier appointment if necessary.  Additionally, you may be receiving a survey about your experience at our clinic within a few days to 1 week by e-mail or mail. We value  your feedback.  [name], DO [place]

## 2016-09-21 NOTE — Progress Notes (Signed)
Subjective:    Patient ID: Sheri [name], female    DOB: 02/24/1965, 51 y.o.   MRN: [id number]  Sheri [name] is a 51 y.o. female presenting on 09/21/2016 for Annual Exam   HPI   Here for Annual Physical and Lab review. Additionally she had labs done by GYN 3 weeks ago.  Hypothyroidism, acquired, New Diagnosis: - Recent labs for work-up of abnormal weight gain / obesity / abnormal menstrual symptoms, included TSH, had this in past 2016 was normal, last checked 3 weeks ago by GYN was mild elevated TSH 5.3, and most recent check by me was 5.77, concerning for new hypothyroidism - She admits family history of hypothyroidism with mother - Admits abnormal weight gain, fatigue, decreased energy, some temperature instability  Morbid Obesity BMI >42 / History of Elevated A1c: - Prior history of A1c 5.6 in 2016, has had other abnormal glucose. Now concerns of persistent chronic fatigue, had attributed this to depression in past. Recent labs A1c improved 5.3. Not pre-diabetes. - Weight gain despite improved lifestyle, trying to improve diet, still gluten free, followed by nutritionist in past [facility] lifestyle center - Worsening weight gain after 3rd baby and MI, then gain 50-60 lbs, difficulty losing weight now  Depression / Anxiety / Chronic Fatigue - Prior known history mood disorder have onset following 03/2014 with STEMI hospitalization and acute illness, afterwards had worsening depression symptoms, started on Lexapro 05/2015 over year later by PCP, has done well on 10mg  daily, now here for yearly renewal - Last visit 05/2016, refilled Lexapro 10mg  - Today reports tolerating med well - Admits decreased energy level - See PHQ - Additional concern for Sleep Apnea, previously recorded in chart, she admits prior history of some witnessed apnea but not regular occurrence, denies significant daytime sleepiness but does admit fatigue, never had sleep study, will consider this would like in home sleep  study if needed - Denies suicidal or homicidal ideation  Epworth Sleepiness Scale Total Score: 3 (Negative, minor symptoms) Sitting and reading - 0 Watching TV - 0 Sitting inactive in a public place - 0 As a passenger in a car for an hour without a break - 0 Lying down to rest in the afternoon when circumstances permit - 3 Sitting and talking to someone - 0 Sitting quietly after a lunch without alcohol - 0 In a car, while stopped for a few minutes in traffic - 0  STOP-Bang OSA scoring Snoring no   Tiredness yes   Observed apneas yes   Pressure HTN yes   BMI > 35 kg/m2 yes   Age > 59  yes   Neck (female >17 in; Female >16 in)  yes 16.5"  Gender female no   OSA risk low (0-2)  OSA risk intermediate (3-4)  OSA risk high (5+)  Total: 6 (high risk)    Health Maintenance: - Breast Cancer Screening: Asymptomatic, last mammo normal, done by her OBGYN Dr Glennon Mac at their office, does not need new order today, she will follow-up with them before end of year, she has family history of breast cancer with mother  - Last colonoscopy 2-3 years ago done by [facility], do not have record available she reports it was normal without polyps told to return in 5 years, will request record  - UTD on Flu Shot for 2018 season, received at pharmacy, does not know which date, requested record  - Declines routine HIV screen   Depression screen [facility] 2/9 09/21/2016 06/19/2016 06/21/2015  Decreased Interest 2 1 3   Down, Depressed, Hopeless 2 0 3  PHQ - 2 Score 4 1 6   Altered sleeping 3 3 3   Tired, decreased energy 3 3 3   Change in appetite 3 2 -  Feeling bad or failure about yourself  1 0 3  Trouble concentrating 0 1 3  Moving slowly or fidgety/restless 0 0 3  Suicidal thoughts 0 0 0  PHQ-9 Score 14 10 21   Difficult doing work/chores Somewhat difficult - Very difficult   GAD 7 : Generalized Anxiety Score 06/19/2016 06/21/2015  Nervous, Anxious, on Edge 3 2  Control/stop worrying 0 3  Worry too  much - different things 0 3  Trouble relaxing 1 3  Restless 0 2  Easily annoyed or irritable 0 3  Afraid - awful might happen 0 3  Total GAD 7 Score 4 19  Anxiety Difficulty Not difficult at all Very difficult    Past Medical History:  Diagnosis Date  . Abnormal uterine bleeding   . Heart attack ([place]) 03/2014  . Heart disease   . History of C-section   . HLD (hyperlipidemia)   . Hx of heart artery stent   . Hypertension    Past Surgical History:  Procedure Laterality Date  . CARDIAC SURGERY    . CESAREAN SECTION    . CHOLECYSTECTOMY    . CYSTOSCOPY WITH STENT PLACEMENT Left 11/01/2015   Procedure: CYSTOSCOPY WITH STENT PLACEMENT;  Surgeon: Hollice Espy, MD;  Location: [facility] ORS;  Service: Urology;  Laterality: Left;  . CYSTOSCOPY/RETROGRADE/URETEROSCOPY Bilateral 11/01/2015   Procedure: CYSTOSCOPY/RETROGRADE/URETEROSCOPY;  Surgeon: Hollice Espy, MD;  Location: [facility] ORS;  Service: Urology;  Laterality: Bilateral;  . DILATION AND CURETTAGE OF UTERUS  2011  . ENDOMETRIAL ABLATION  2011  . LEFT HEART CATHETERIZATION WITH CORONARY ANGIOGRAM N/A 04/06/2014   Procedure: LEFT HEART CATHETERIZATION WITH CORONARY ANGIOGRAM;  Surgeon: Jettie Booze, MD;  Location[facility] CATH LAB;  Service: Cardiovascular;  Laterality: N/A;   Social History   Social History  . Marital status: Married    Spouse name: N/A  . Number of children: N/A  . Years of education: N/A   Occupational History  . Not on file.   Social History Main Topics  . Smoking status: Former Research scientist (life sciences)  . Smokeless tobacco: Never Used     Comment: quit 20 years  . Alcohol use No  . Drug use: No  . Sexual activity: Yes    Birth control/ protection: None   Other Topics Concern  . Not on file   Social History Narrative  . No narrative on file   Family History  Problem Relation Age of Onset  . Heart attack Father 84       died of MI at age 68  . Hypertension Mother   . Hyperlipidemia Mother   . Hypothyroidism  Mother   . Kidney Stones Sister   . Kidney Stones Brother   . Stroke Paternal Grandfather   . Liver cancer Maternal Grandmother   . Kidney disease Neg Hx   . Bladder Cancer Neg Hx    Current Outpatient Prescriptions on File Prior to Visit  Medication Sig  . aspirin 81 MG chewable tablet Chew 1 tablet (81 mg total) by mouth daily.  . clopidogrel (PLAVIX) 75 MG tablet TAKE 1 TABLET (75 MG TOTAL) BY MOUTH DAILY.  Marland Kitchen dicyclomine (BENTYL) 10 MG capsule Take 1 capsule (10 mg total) by mouth 4 (four) times daily -  before meals and at  bedtime.  Marland Kitchen escitalopram (LEXAPRO) 10 MG tablet Take 1 tablet (10 mg total) by mouth daily.  . isosorbide mononitrate (IMDUR) 30 MG 24 hr tablet TAKE 1 TABLET (30 MG TOTAL) BY MOUTH DAILY.  Marland Kitchen lisinopril (PRINIVIL,ZESTRIL) 5 MG tablet Take 1 tablet (5 mg total) by mouth daily.  . metoprolol tartrate (LOPRESSOR) 25 MG tablet Take 1 tablet (25 mg total) by mouth 2 (two) times daily.  . nitroGLYCERIN (NITROSTAT) 0.4 MG SL tablet Place 1 tablet (0.4 mg total) under the tongue every 5 (five) minutes x 3 doses as needed for chest pain.  . Probiotic Product (ALIGN) 4 MG CAPS Take 4 mg by mouth 2 (two) times daily.  . rosuvastatin (CRESTOR) 20 MG tablet Take 1 tablet (20 mg total) by mouth daily.   No current facility-administered medications on file prior to visit.     Review of Systems  Constitutional: Positive for fatigue and unexpected weight change (abnormal weight gain). Negative for activity change, appetite change, chills, diaphoresis and fever.  HENT: Negative for congestion, hearing loss and sinus pressure.   Eyes: Negative for visual disturbance.  Respiratory: Positive for apnea (history of witnessed apnea years ago). Negative for cough, choking, chest tightness, shortness of breath and wheezing.   Cardiovascular: Negative for chest pain, palpitations and leg swelling.  Gastrointestinal: Negative for abdominal distention, abdominal pain, anal bleeding, blood in  stool, constipation, diarrhea, nausea and vomiting.  Endocrine: Positive for heat intolerance. Negative for cold intolerance and polyuria.  Genitourinary: Negative for decreased urine volume, difficulty urinating, dysuria, frequency, hematuria, pelvic pain and urgency.  Musculoskeletal: Negative for arthralgias, back pain and neck pain.  Skin: Negative for rash.  Allergic/Immunologic: Negative for environmental allergies.  Neurological: Negative for dizziness, weakness, light-headedness, numbness and headaches.  Hematological: Negative for adenopathy.  Psychiatric/Behavioral: Negative for behavioral problems, dysphoric mood and sleep disturbance. The patient is not nervous/anxious.    Per HPI unless specifically indicated above     Objective:    BP 123/77   Pulse 74   Temp 98.3 F (36.8 C) (Oral)   Resp 16   Ht 5\' 5"  (1.651 m)   Wt 253 lb (114.8 kg)   BMI 42.10 kg/m   Wt Readings from Last 3 Encounters:  09/21/16 252 lb 12.8 oz (114.7 kg)  09/21/16 253 lb (114.8 kg)  09/01/16 233 lb (105.7 kg)    Physical Exam  Constitutional: She is oriented to person, place, and time. She appears well-developed and well-nourished. No distress.  Well-appearing, comfortable, cooperative, obese  HENT:  Head: Normocephalic and atraumatic.  Mouth/Throat: Oropharynx is clear and moist.  Frontal / maxillary sinuses non-tender. Nares patent without purulence or edema. Oropharynx clear without erythema, exudates, edema or asymmetry.  Mallampati Score 2 - Complete visualization of uvula  Eyes: Conjunctivae are normal. Right eye exhibits no discharge. Left eye exhibits no discharge.  Neck: Normal range of motion. Neck supple. Thyromegaly (mild fullness of thyroid palpable without nodularity or tenderness) present.  Circumference 16.5"  Cardiovascular: Normal rate, regular rhythm, normal heart sounds and intact distal pulses.   No murmur heard. Pulmonary/Chest: Effort normal and breath sounds normal.  No respiratory distress. She has no wheezes. She has no rales.  Abdominal: Soft. Bowel sounds are normal. She exhibits no distension. There is no tenderness.  Musculoskeletal: Normal range of motion. She exhibits no edema.  Lymphadenopathy:    She has no cervical adenopathy.  Neurological: She is alert and oriented to person, place, and time.  Skin: Skin is  warm and dry. No rash noted. She is not diaphoretic. No erythema.  Psychiatric: She has a normal mood and affect. Her behavior is normal.  Well groomed, good eye contact, normal speech and thoughts  Nursing note and vitals reviewed.  Results for orders placed or performed in visit on 09/18/16  TSH  Result Value Ref Range   TSH 5.77 (H) mIU/L  Hemoglobin A1c  Result Value Ref Range   Hgb A1c MFr Bld 5.3 <5.7 %   Mean Plasma Glucose 105 mg/dL  Vitamin B12  Result Value Ref Range   Vitamin B-12 416 200 - 1,100 pg/mL  VITAMIN D 25 Hydroxy (Vit-D Deficiency, Fractures)  Result Value Ref Range   Vit D, 25-Hydroxy 22 (L) 30 - 100 ng/mL      Assessment & Plan:   Problem List Items Addressed This Visit    Vitamin D deficiency    Mild low 22 May be contributing to fatigue Start therapy Vitamin D3 5,000 iu daily for 12 weeks then down to 2k daily maintenance Re-check 3-6 months      Morbid obesity with BMI of 40.0-44.9, adult (HCC)    Continued chronic weight gain over past 2 years following MI and depression - A1c normal range, not PreDM - New concern elevated TSH, suspected hypothyroidism  Plan: 1. Counseling on lifestyle modifications again encouraged to continue improve diet, low carb, low sugar, inc water less soda, start regular exercise 2. Treat hypothyroidism 3. Follow-up wt progress, if still not improving consider referral to Wt loss Management vs Obesity Clinic vs possible trial on Contrave (depending on anxiety/depression status)      Hyperlipidemia    Not at goal LDL On atorvastatin Followed by Cardiology        Chronic fatigue    Uncertain exact etiology, seems multifactorial now new concern with possible hypothyroidism as large contributing factor, with wt gain, poor sleep / insomnia, depression/anxiety, and prior CAD/MI. - Additionally concern for possible OSA, however not having excessive daytime sleepiness, so more consistent with fatigue - Vitamin B12 is normal - Vitamin D is mild low - this could be contributing to fatigue  Plan: 1. Treat hypothyroid 2. Treat Vitamin D - replacement 5k D3 for 12 weeks then 2k 3. Continue manage wt, lifestyle, and mood      Anxiety and depression    Stable to slightly worsening symptoms despite SSRI, compounded by insomnia poor sleep among other factors, likely hypothyroid PHQ9: 14 - No failed meds. No psychiatry / therapy  Plan: 1. Continue Escitalopram 10mg  daily - considered dose inc to 20mg  to better control symptoms, however patient interested to possibly taper off in 6 months with changed / improved life stressors. However discussed she may need indefinitely if helping vs may need titrate dose to 20 in future 2. Declines therapist or counseling 3. Follow-up 6 weeks depression/anxiety, med adjust      Acquired hypothyroidism    New diagnosis, with elevated TSH on 2 readings 5.3 to 5.7 Added [facility], pending result Concern with known CAD/MI, will use lowest dose titrate Levothyroxine Start Levothyroxine 40mcg daily for now, may gradually increase as tolerated q 4-6 weeks pending TSH Follow-up 6 weeks repeat TSH, Free T4 labs then office visit - eventually may help control weight, energy, mood      Relevant Medications   levothyroxine (SYNTHROID, LEVOTHROID) 25 MCG tablet    Other Visit Diagnoses    Annual physical exam    -  Primary  Screening for breast cancer       Asymptomatic. Prior normal. To be done by patient's GYN, does not need new order today      Meds ordered this encounter  Medications  . levothyroxine  (SYNTHROID, LEVOTHROID) 25 MCG tablet    Sig: Take 1 tablet (25 mcg total) by mouth daily before breakfast.    Dispense:  30 tablet    Refill:  5    Follow up plan: Return in about 6 weeks (around 11/02/2016) for Thyroid f/u, med adjust, ?OSA.  [name], [facility] 09/21/2016, 4:08 PM

## 2016-09-21 NOTE — Assessment & Plan Note (Signed)
New diagnosis, with elevated TSH on 2 readings 5.3 to 5.7 Added Free T[facility] to [facility], pending result Concern with known CAD/MI, will use lowest dose titrate Levothyroxine Start Levothyroxine 40mcg daily for now, may gradually increase as tolerated q 4-6 weeks pending TSH Follow-up 6 weeks repeat TSH, Free T4 labs then office visit - eventually may help control weight, energy, mood

## 2016-09-21 NOTE — Assessment & Plan Note (Signed)
Stable to slightly worsening symptoms despite SSRI, compounded by insomnia poor sleep among other factors, likely hypothyroid PHQ9: 14 - No failed meds. No psychiatry / therapy  Plan: 1. Continue Escitalopram 10mg  daily - considered dose inc to 20mg  to better control symptoms, however patient interested to possibly taper off in 6 months with changed / improved life stressors. However discussed she may need indefinitely if helping vs may need titrate dose to 20 in future 2. Declines therapist or counseling 3. Follow-up 6 weeks depression/anxiety, med adjust

## 2016-09-21 NOTE — Progress Notes (Signed)
09/21/2016 Sheri [name]   [birth year]  [id number]  Primary Physician Parks Ranger Devonne Doughty, DO Primary Cardiologist: Dr. Irish Lack   Reason for Visit/CC: Surgical Clearance for Hysterectomy    HPI:  The patient is a 51 year old female, followed by Dr. Irish Lack, who presents to clinic today for preoperative clearance. She is scheduled to undergo a total hysterectomy and laparoscopic bilateral salpingectomy.   She has a known history of CAD. In March 2016, she was admitted for an inferior MI. Left heart catheterization showed culprit vessel to be an occluded RCA for which she underwent PCI plus stenting. 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 This was treated medically. The left main coronary artery is absent. 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 She was placed on dual antiplatelet therapy. Several months later, she was seen by Dr. Irish Lack in October 2016 for exertional dyspnea. There was question as to whether or not this was her anginal equivalent, secondary to her circumflex disease. Subsequently Dr. Beau Fanny ordered for her to undergo a nuclear stress test to assess for underlying ischemia.  This was a low risk study. There was a small, mild intensity lateral fixed perfusion defect which was felt to be attenuation artifact. There was no significant reversible ischemia. LVEF was 64% with normal wall motion.  2D echo also confirmed normal LVEF and BNP was also normal. Additional past medical history also includes hypertension and hyperlipidemia for which she is being treated for.  I evaluated the patient about 1 year ago September 2017, also for surgical clearance, prior to undergoing ureteroscopy with laser lithotripsy for nephrolithiasis. She was w/o symptoms or EKG changes, thus was cleared for surgery. She did well w/o any issues.   She was seen back for routine  6 month f/u with Dr. Irish Lack in April of this year. She denied CP. Lipids were checked. LDL was elevated at 148 mg/dL. She was instructed to increase Crestor to 20 mg daily and to repeat FLP and HFTs in 3 months. However, no repeat labs done.   Today, she denies any exertional CP or symptoms similar to her MI pain, however she has chronic exertional dyspnea, which she states has been present for years and no change since her MI in 2016. She has not required use of SL NTG. She is able to ambulate a flight of stairs w/o exertional CP. Her EKG today shows NSR. No ST abnormalities. HR 74 bpm. BP is 130/90.     Current Meds  Medication Sig  . aspirin 81 MG chewable tablet Chew 1 tablet (81 mg total) by mouth daily.  . clopidogrel (PLAVIX) 75 MG tablet TAKE 1 TABLET (75 MG TOTAL) BY MOUTH DAILY.  Marland Kitchen dicyclomine (BENTYL) 10 MG capsule Take 1 capsule (10 mg total) by mouth 4 (four) times daily -  before meals and at bedtime.  Marland Kitchen escitalopram (LEXAPRO) 10 MG tablet Take 1 tablet (10 mg total) by mouth daily.  . isosorbide mononitrate (IMDUR) 30 MG 24 hr tablet TAKE 1 TABLET (30 MG TOTAL) BY MOUTH DAILY.  Marland Kitchen levothyroxine (SYNTHROID, LEVOTHROID) 25 MCG tablet Take 1 tablet (25 mcg total) by mouth daily before breakfast.  . lisinopril (PRINIVIL,ZESTRIL) 5 MG tablet Take 1 tablet (5 mg total) by mouth daily.  . metoprolol tartrate (LOPRESSOR) 25 MG tablet Take 1 tablet (25 mg total) by mouth 2 (two) times daily.  . nitroGLYCERIN (NITROSTAT) 0.4 MG  SL tablet Place 1 tablet (0.4 mg total) under the tongue every 5 (five) minutes x 3 doses as needed for chest pain.  . Probiotic Product (ALIGN) 4 MG CAPS Take 4 mg by mouth 2 (two) times daily.  . rosuvastatin (CRESTOR) 20 MG tablet Take 1 tablet (20 mg total) by mouth daily.   Allergies  Allergen Reactions  . Latex Dermatitis  . Penicillins Rash   Past Medical History:  Diagnosis Date  . Abnormal uterine bleeding   . Heart attack ([place]) 03/2014  . Heart  disease   . History of C-section   . HLD (hyperlipidemia)   . Hx of heart artery stent   . Hypertension    Family History  Problem Relation Age of Onset  . Heart attack Father 2       died of MI at age 3  . Hypertension Mother   . Hyperlipidemia Mother   . Hypothyroidism Mother   . Kidney Stones Sister   . Kidney Stones Brother   . Stroke Paternal Grandfather   . Liver cancer Maternal Grandmother   . Kidney disease Neg Hx   . Bladder Cancer Neg Hx    Past Surgical History:  Procedure Laterality Date  . CARDIAC SURGERY    . CESAREAN SECTION    . CHOLECYSTECTOMY    . CYSTOSCOPY WITH STENT PLACEMENT Left 11/01/2015   Procedure: CYSTOSCOPY WITH STENT PLACEMENT;  Surgeon: Hollice Espy, MD;  Location: [facility] ORS;  Service: Urology;  Laterality: Left;  . CYSTOSCOPY/RETROGRADE/URETEROSCOPY Bilateral 11/01/2015   Procedure: CYSTOSCOPY/RETROGRADE/URETEROSCOPY;  Surgeon: Hollice Espy, MD;  Location: [facility] ORS;  Service: Urology;  Laterality: Bilateral;  . DILATION AND CURETTAGE OF UTERUS  2011  . ENDOMETRIAL ABLATION  2011  . LEFT HEART CATHETERIZATION WITH CORONARY ANGIOGRAM N/A 04/06/2014   Procedure: LEFT HEART CATHETERIZATION WITH CORONARY ANGIOGRAM;  Surgeon: Jettie Booze, MD;  Location: [facility] CATH LAB;  Service: Cardiovascular;  Laterality: N/A;   Social History   Social History  . Marital status: Married    Spouse name: N/A  . Number of children: N/A  . Years of education: N/A   Occupational History  . Not on file.   Social History Main Topics  . Smoking status: Former Research scientist (life sciences)  . Smokeless tobacco: Never Used     Comment: quit 20 years  . Alcohol use No  . Drug use: No  . Sexual activity: Yes    Birth control/ protection: None   Other Topics Concern  . Not on file   Social History Narrative  . No narrative on file     Review of Systems: General: negative for chills, fever, night sweats or weight changes.  Cardiovascular: negative for chest pain, dyspnea on  exertion, edema, orthopnea, palpitations, paroxysmal nocturnal dyspnea or shortness of breath Dermatological: negative for rash Respiratory: negative for cough or wheezing Urologic: negative for hematuria Abdominal: negative for nausea, vomiting, diarrhea, bright red blood per rectum, melena, or hematemesis Neurologic: negative for visual changes, syncope, or dizziness All other systems reviewed and are otherwise negative except as noted above.   Physical Exam:  Blood pressure 130/90, pulse 74, height 5\' 5"  (1.651 m), weight 252 lb 12.8 oz (114.7 kg).  General appearance: alert, cooperative and no distress Neck: no carotid bruit and no JVD Lungs: clear to auscultation bilaterally Heart: regular rate and rhythm, S1, S2 normal, no murmur, click, rub or gallop Extremities: extremities normal, atraumatic, no cyanosis or edema Pulses: 2+ and symmetric Skin: Skin color, texture, turgor normal.  No rashes or lesions Neurologic: Grossly normal  EKG NSR 74 bpm  -- personally reviewed   ASSESSMENT AND PLAN:   1. 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 Normal LVEF. Scheduled to undergo total hysterectomy in September. She denies any exertional CP or symptoms similar to her MI pain, however she has chronic exertional dyspnea, which she states has been present for years and no change since her MI in 2016. She has not required use of SL NTG. She is able to ambulate a flight of stairs w/o exertional CP. Her EKG today shows NSR. No ST abnormalities. HR 74 bpm. BP is 130/90. I've discussed patient case with Dr. Saunders Revel, DOD. She has chronic dyspnea, which is unchanged and same as post PCI, however no exertional CP and she is able to complete 4 METS of activity (walk up a flight of stairs) w/o exertional CP. We will therefore clear her for surgery. No indication for additional  cardiac testing prior to surgery. Ok to hold Plavix 5-7 days prior to surgery, but advise that Plavix be resumed when safely to do so from a surgical standpoint.   2. CAD: stable s/o angina. Continue medical therapy.   3. HLD: last lipid panel showed that LDL was not at goal. LDL was in the 140s. Crestor increased to 20 mg daily. She reports compliance and is fasting today. Check FLP and HFTs.    Follow-Up in 6 months.   Ophia Shamoon Ladoris Gene, M[facility] 09/21/2016 2:12 PM

## 2016-09-22 ENCOUNTER — Other Ambulatory Visit: Payer: Self-pay

## 2016-09-22 DIAGNOSIS — E039 Hypothyroidism, unspecified: Secondary | ICD-10-CM

## 2016-09-22 LAB — HEPATIC FUNCTION PANEL
ALBUMIN: 4.3 g/dL (ref 3.5–5.5)
ALK PHOS: 101 IU/L (ref 39–117)
ALT: 44 IU/L — ABNORMAL HIGH (ref 0–32)
AST: 34 IU/L (ref 0–40)
BILIRUBIN TOTAL: 0.3 mg/dL (ref 0.0–1.2)
BILIRUBIN, DIRECT: 0.09 mg/dL (ref 0.00–0.40)
Total Protein: 7.1 g/dL (ref 6.0–8.5)

## 2016-09-22 LAB — LIPID PANEL
CHOL/HDL RATIO: 4.1 ratio (ref 0.0–4.4)
Cholesterol, Total: 148 mg/dL (ref 100–199)
HDL: 36 mg/dL — AB (ref >39)
LDL Calculated: 88 mg/dL (ref 0–99)
Triglycerides: 120 mg/dL (ref 0–149)
VLDL Cholesterol Cal: 24 mg/dL (ref 5–40)

## 2016-09-22 LAB — T4, FREE: FREE T4: 1.1 ng/dL (ref 0.8–1.8)

## 2016-09-26 ENCOUNTER — Ambulatory Visit: Payer: BLUE CROSS/BLUE SHIELD | Admitting: Physician Assistant

## 2016-09-29 ENCOUNTER — Ambulatory Visit (INDEPENDENT_AMBULATORY_CARE_PROVIDER_SITE_OTHER): Payer: BLUE CROSS/BLUE SHIELD | Admitting: Obstetrics and Gynecology

## 2016-09-29 ENCOUNTER — Encounter: Payer: Self-pay | Admitting: Obstetrics and Gynecology

## 2016-09-29 ENCOUNTER — Encounter
Admission: RE | Admit: 2016-09-29 | Discharge: 2016-09-29 | Disposition: A | Discharge status: Home / Self Care | Payer: BLUE CROSS/BLUE SHIELD | Source: Ambulatory Visit | Attending: Obstetrics and Gynecology | Admitting: Obstetrics and Gynecology

## 2016-09-29 VITALS — BP 124/78 | Ht 65.0 in | Wt 241.0 lb

## 2016-09-29 DIAGNOSIS — E039 Hypothyroidism, unspecified: Secondary | ICD-10-CM | POA: Diagnosis not present

## 2016-09-29 DIAGNOSIS — N200 Calculus of kidney: Secondary | ICD-10-CM | POA: Insufficient documentation

## 2016-09-29 DIAGNOSIS — R911 Solitary pulmonary nodule: Secondary | ICD-10-CM | POA: Diagnosis not present

## 2016-09-29 DIAGNOSIS — I10 Essential (primary) hypertension: Secondary | ICD-10-CM | POA: Diagnosis not present

## 2016-09-29 DIAGNOSIS — Z01812 Encounter for preprocedural laboratory examination: Secondary | ICD-10-CM | POA: Diagnosis not present

## 2016-09-29 DIAGNOSIS — E785 Hyperlipidemia, unspecified: Secondary | ICD-10-CM | POA: Insufficient documentation

## 2016-09-29 DIAGNOSIS — G4762 Sleep related leg cramps: Secondary | ICD-10-CM | POA: Insufficient documentation

## 2016-09-29 DIAGNOSIS — I2510 Atherosclerotic heart disease of native coronary artery without angina pectoris: Secondary | ICD-10-CM | POA: Diagnosis not present

## 2016-09-29 DIAGNOSIS — N921 Excessive and frequent menstruation with irregular cycle: Secondary | ICD-10-CM | POA: Insufficient documentation

## 2016-09-29 DIAGNOSIS — Z6841 Body Mass Index (BMI) 40.0 and over, adult: Secondary | ICD-10-CM | POA: Diagnosis not present

## 2016-09-29 DIAGNOSIS — E559 Vitamin D deficiency, unspecified: Secondary | ICD-10-CM | POA: Diagnosis not present

## 2016-09-29 DIAGNOSIS — I252 Old myocardial infarction: Secondary | ICD-10-CM | POA: Diagnosis not present

## 2016-09-29 HISTORY — DX: Atherosclerotic heart disease of native coronary artery without angina pectoris: I25.10

## 2016-09-29 HISTORY — DX: Cardiac arrhythmia, unspecified: I49.9

## 2016-09-29 HISTORY — DX: Anxiety disorder, unspecified: F41.9

## 2016-09-29 HISTORY — DX: Personal history of urinary calculi: Z87.442

## 2016-09-29 HISTORY — DX: Dyspnea, unspecified: R06.00

## 2016-09-29 LAB — COMPREHENSIVE METABOLIC PANEL
ALT: 42 U/L (ref 14–54)
AST: 38 U/L (ref 15–41)
Albumin: 4.3 g/dL (ref 3.5–5.0)
Alkaline Phosphatase: 87 U/L (ref 38–126)
Anion gap: 10 (ref 5–15)
BILIRUBIN TOTAL: 0.7 mg/dL (ref 0.3–1.2)
BUN: 19 mg/dL (ref 6–20)
CHLORIDE: 102 mmol/L (ref 101–111)
CO2: 27 mmol/L (ref 22–32)
CREATININE: 0.76 mg/dL (ref 0.44–1.00)
Calcium: 9.9 mg/dL (ref 8.9–10.3)
GFR calc Af Amer: >60 mL/min (ref >60)
GFR calc non Af Amer: >60 mL/min (ref >60)
GLUCOSE: 98 mg/dL (ref 65–99)
POTASSIUM: 3.9 mmol/L (ref 3.5–5.1)
Sodium: 139 mmol/L (ref 135–145)
Total Protein: 7.8 g/dL (ref 6.5–8.1)

## 2016-09-29 LAB — CBC
HEMATOCRIT: 38.5 % (ref 35.0–47.0)
Hemoglobin: 13.3 g/dL (ref 12.0–16.0)
MCH: 28.8 pg (ref 26.0–34.0)
MCHC: 34.5 g/dL (ref 32.0–36.0)
MCV: 83.3 fL (ref 80.0–100.0)
PLATELETS: 260 10*3/uL (ref 150–440)
RBC: 4.62 MIL/uL (ref 3.80–5.20)
RDW: 13.2 % (ref 11.5–14.5)
WBC: 10.8 10*3/uL (ref 3.6–11.0)

## 2016-09-29 LAB — TYPE AND SCREEN
ABO/RH(D): B NEG
Antibody Screen: NEGATIVE

## 2016-09-29 NOTE — Patient Instructions (Signed)
Your procedure is scheduled on: October 10, 2016 [facility] ) Report to Same Day Surgery 2nd floor medical mall ([facility]-take elevator on left to 2nd floor.  Check in with surgery information desk.) To find out your arrival time please call [phone] between 1PM - 3PM on SEPTEMBER 10,, 2018 [facility] ) Remember: Instructions that are not followed completely may result in serious medical risk, up to and including death, or upon the discretion of your surgeon and anesthesiologist your surgery may need to be rescheduled.    _x___ 1. Do not eat food after midnight the night before your procedure. You may drink clear liquids up to 2 hours before you are scheduled to arrive at the hospital for your procedure.  Do not drink clear liquids within 2 hours of your scheduled arrival to the hospital.  Clear liquids include  --Water or Apple juice without pulp  --Clear carbohydrate beverage such as ClearFast or Gatorade  --Black Coffee or Clear Tea (No milk, no creamers, do not add anything to                  the coffee or Tea Type 1 and type 2 diabetics should only drink water.  No gum chewing or hard candies.     __x__ 2. No Alcohol for 24 hours before or after surgery.   __x__3. No Smoking for 24 prior to surgery.   ____  4. Bring all medications with you on the day of surgery if instructed.    __x__ 5. Notify your doctor if there is any change in your medical condition     (cold, fever, infections).     Do not wear jewelry, make-up, hairpins, clips or nail polish.  Do not wear lotions, powders, or perfumes.   Do not shave 48 hours prior to surgery. Men may shave face and neck.  Do not bring valuables to the hospital.    [facility] is not responsible for any belongings or valuables.               Contacts, dentures or bridgework may not be worn into surgery.  Leave your suitcase in the car. After surgery it may be brought to your room.  For patients admitted to the hospital,  discharge time is determined by your treatment team                      Patients discharged the day of surgery will not be allowed to drive home.  You will need someone to drive you home and stay with you the night of your procedure.    Please read over the following fact sheets that you were given:   [facility] Preparing for Surgery and or MRSA Information   TAKE THE FOLLOWING MEDICATIONS WITH A SIP OF WATER THE MORNING OF SURGERY :  1. LEXAPRO  2. LEVOTHYROXINE  3. METOPROLOL  4. ISOSORBIDE  5.  6.  ____Fleets enema or Magnesium Citrate as directed.   _x___ Use CHG Soap or sage wipes as directed on instruction sheet   ____ Use inhalers on the day of surgery and bring to hospital day of surgery  ____ Stop Metformin and Janumet 2 days prior to surgery.    ____ Take 1/2 of usual insulin dose the night before surgery and none on the morning     surgery.   _x___ Follow recommendations from Cardiologist, Pulmonologist or PCP regarding  stopping Aspirin, Coumadin, Plavix ,Eliquis, Effient, or Pradaxa, and Pletal. (PATIENT  INSTRUCTED BY CARDIOLOGIST TO STOP PLAVIX ON SEPTEMBER 3 )  X____Stop Anti-inflammatories such as Advil, Aleve, Ibuprofen, Motrin, Naproxen, Naprosyn, Goodies powders or aspirin products. OK to take Tylenol    _x___ Stop supplements until after surgery.  But may continue Vitamin D, Vitamin B,and multivitamin      ____ Bring C-Pap to the hospital.

## 2016-09-29 NOTE — Progress Notes (Signed)
Preoperative History and Physical  Sheri [name] is a 51 y.o. [phone] here for surgical management of menorrhagia.   Sheri [name] has a history of myocardial infarction, but has been cleared by her cardiologist.   History of Present Illness: Sheri y.o. G24P3003 female who presents with recent episodes of bleeding after having no bleeding in a long time.   Sheri [name] has a history of endometrial ablation in approximately 2012.  Sheri [name] had an episode of bleeding and pelvic pain in 2016. The gynecologic workup was normal.  However, Sheri [name] believes that was related to kidney stones.  A couple of months ago Sheri [name] noted some light pink vaginal bleeding and some slight cramping.  Then her bleeding stopped. Shortly after this episode, Sheri [name] began having heavy vaginal bleeding associated with severe lower abdominal and pelvic pain.  Sheri [name] states that Sheri [name] could not get out of bed at one point.  Since that time Sheri [name] has had mild dark-brown spotting with much less pain.  Sheri [name] states that the pain was like a contractions and having a baby.  The pain did not radiate and it was severe.  Sheri [name] took ibuprofen 600 mg every 6-8 hours and this seemed to help with the pain.  Nothing seemed to make the pain worse.  Sheri [name] felt nauseated with the pain. That was her only associated symptom.  Sheri [name] denies weight loss (has gained weight), denies bloating and constipation. Sheri [name] has noted some early satiety.  Denies any inciting event.  Sheri [name] denies current hot flashes and other symptoms of menopause. Sheri [name] states that Sheri [name] used to have these symptoms and they started when Sheri [name] was in her late 42's. Sheri [name] had a pelvic ultrasound that was remarkable for an anteverted, globular uterus with an endometrial strope of 12.1 mm with a heterogeneous echotexture to the myometrium.  Sheri [name] had a negative pap smear in July this year, TSH slightly elevated, endometrial biopsy with extremely scant viable tissue (status post ablation).  Sheri [name] has been cleared by her cardiologist for surgery.  Proposed  surgery: Total laparoscopic hysterectomy, bilateral salpingectomy, cystoscopy  Past Medical History:  Diagnosis Date  . Abnormal uterine bleeding   . Heart attack ([place]) 03/2014  . Heart disease   . History of C-section   . HLD (hyperlipidemia)   . Hx of heart artery stent   . Hypertension    Past Surgical History:  Procedure Laterality Date  . CARDIAC SURGERY    . CESAREAN SECTION    . CHOLECYSTECTOMY    . CYSTOSCOPY WITH STENT PLACEMENT Left 11/01/2015   Procedure: CYSTOSCOPY WITH STENT PLACEMENT;  Surgeon: Hollice Espy, MD;  Location: [facility] ORS;  Service: Urology;  Laterality: Left;  . CYSTOSCOPY/RETROGRADE/URETEROSCOPY Bilateral 11/01/2015   Procedure: CYSTOSCOPY/RETROGRADE/URETEROSCOPY;  Surgeon: Hollice Espy, MD;  Location: [facility] ORS;  Service: Urology;  Laterality: Bilateral;  . DILATION AND CURETTAGE OF UTERUS  2011  . ENDOMETRIAL ABLATION  2011  . LEFT HEART CATHETERIZATION WITH CORONARY ANGIOGRAM N/A 04/06/2014   Procedure: LEFT HEART CATHETERIZATION WITH CORONARY ANGIOGRAM;  Surgeon: Jettie Booze, MD;  Location: [facility] CATH LAB;  Service: Cardiovascular;  Laterality: N/A;   OB History  Gravida Para Term Preterm AB Living  3 3 3     3   SAB TAB Ectopic Multiple Live Births          3    # Outcome Date GA Lbr Len/2nd Weight Sex Delivery Anes PTL Lv  3 Term      CS-LTranv   LIV  2 Term  CS-LTranv   LIV  1 Term      CS-LTranv   LIV    Patient denies any other pertinent gynecologic issues.   Current Outpatient Prescriptions on File Prior to Visit  Medication Sig Dispense Refill  . aspirin 81 MG chewable tablet Chew 1 tablet (81 mg total) by mouth daily.    . clopidogrel (PLAVIX) 75 MG tablet TAKE 1 TABLET (75 MG TOTAL) BY MOUTH DAILY. 30 tablet 10  . dicyclomine (BENTYL) 10 MG capsule Take 1 capsule (10 mg total) by mouth 4 (four) times daily -  before meals and at bedtime. (Patient not taking: Reported on 09/26/2016) 30 capsule 0  . escitalopram (LEXAPRO) 10 MG  tablet Take 1 tablet (10 mg total) by mouth daily. 30 tablet 11  . isosorbide mononitrate (IMDUR) 30 MG 24 hr tablet TAKE 1 TABLET (30 MG TOTAL) BY MOUTH DAILY. 90 tablet 3  . levothyroxine (SYNTHROID, LEVOTHROID) Sheri MCG tablet Take 1 tablet (Sheri mcg total) by mouth daily before breakfast. 30 tablet 5  . lisinopril (PRINIVIL,ZESTRIL) 5 MG tablet Take 1 tablet (5 mg total) by mouth daily. 30 tablet 8  . metoprolol tartrate (LOPRESSOR) Sheri MG tablet Take 1 tablet (Sheri mg total) by mouth 2 (two) times daily. 180 tablet 3  . Multiple Vitamin (MULTIVITAMIN WITH MINERALS) TABS tablet Take 1 tablet by mouth daily.    . nitroGLYCERIN (NITROSTAT) 0.4 MG SL tablet Place 1 tablet (0.4 mg total) under the tongue every 5 (five) minutes x 3 doses as needed for chest pain. Sheri tablet 2  . Probiotic Product (ALIGN) 4 MG CAPS Take 4 mg by mouth 2 (two) times daily.    . rosuvastatin (CRESTOR) 20 MG tablet Take 1 tablet (20 mg total) by mouth daily. 30 tablet 0  . rosuvastatin (CRESTOR) 20 MG tablet Take 20 mg by mouth daily.     No current facility-administered medications on file prior to visit.    Allergies  Allergen Reactions  . Latex Dermatitis  . Penicillins Rash    Has patient had a PCN reaction causing immediate rash, facial/tongue/throat swelling, SOB or lightheadedness with hypotension: Yes Has patient had a PCN reaction causing severe rash involving mucus membranes or skin necrosis: Unknown Has patient had a PCN reaction that required hospitalization: No Has patient had a PCN reaction occurring within the last 10 years: No If all of the above answers are "NO", then may proceed with Cephalosporin use.     Social History:   reports that Sheri [name] has quit smoking. Sheri [name] has never used smokeless tobacco. Sheri [name] reports that Sheri [name] does not drink alcohol or use drugs.  Family History  Problem Relation Age of Onset  . Heart attack Father 27       died of MI at age 1  . Hypertension Mother   . Hyperlipidemia  Mother   . Hypothyroidism Mother   . Kidney Stones Sister   . Kidney Stones Brother   . Stroke Paternal Grandfather   . Liver cancer Maternal Grandmother   . Kidney disease Neg Hx   . Bladder Cancer Neg Hx     Review of Systems: Noncontributory  PHYSICAL EXAM: BP 124/78   Ht 5\' 5"  (1.651 m)   Wt 241 lb (109.3 kg)   BMI 40.10 kg/m   CONSTITUTIONAL: Well-developed, well-nourished female in no acute distress.  HENT:  Normocephalic, atraumatic, External right and left ear normal. Oropharynx is clear and moist EYES: Conjunctivae and EOM are normal. Pupils are equal, round,  and reactive to light. No scleral icterus.  NECK: Normal range of motion, supple, no masses SKIN: Skin is warm and dry. No rash noted. Not diaphoretic. No erythema. No pallor. [place]: Alert and oriented to person, place, and time. Normal reflexes, muscle tone coordination. No cranial nerve deficit noted. PSYCHIATRIC: Normal mood and affect. Normal behavior. Normal judgment and thought content. CARDIOVASCULAR: Normal heart rate noted, regular rhythm RESPIRATORY: Effort and breath sounds normal, no problems with respiration noted ABDOMEN: Soft, nontender, nondistended. PELVIC: Deferred MUSCULOSKELETAL: Normal range of motion. No edema and no tenderness. 2+ distal pulses.  Labs: Results for orders placed or performed in visit on 09/21/16 (from the past 336 hour(s))  Lipid panel   Collection Time: 09/21/16  2:24 PM  Result Value Ref Range   Cholesterol, Total 148 100 - 199 mg/dL   Triglycerides 120 0 - 149 mg/dL   HDL 36 (L) >39 mg/dL   VLDL Cholesterol Cal 24 5 - 40 mg/dL   LDL Calculated 88 0 - 99 mg/dL   Chol/HDL Ratio 4.1 0.0 - 4.4 ratio  Hepatic function panel   Collection Time: 09/21/16  2:24 PM  Result Value Ref Range   Total Protein 7.1 6.0 - 8.5 g/dL   Albumin 4.3 3.5 - 5.5 g/dL   Bilirubin Total 0.3 0.0 - 1.2 mg/dL   Bilirubin, Direct 0.09 0.00 - 0.40 mg/dL   Alkaline Phosphatase 101 39 - 117  IU/L   AST 34 0 - 40 IU/L   ALT 44 (H) 0 - 32 IU/L  Results for orders placed or performed in visit on 09/18/16 (from the past 336 hour(s))  T4, free   Collection Time: 09/18/16  8:00 AM  Result Value Ref Range   Free T4 1.1 0.8 - 1.8 ng/dL  Results for orders placed or performed in visit on 09/18/16 (from the past 336 hour(s))  TSH   Collection Time: 09/18/16  8:00 AM  Result Value Ref Range   TSH 5.77 (H) mIU/L  Hemoglobin A1c   Collection Time: 09/18/16  8:00 AM  Result Value Ref Range   Hgb A1c MFr Bld 5.3 <5.7 %   Mean Plasma Glucose 105 mg/dL  Vitamin B12   Collection Time: 09/18/16  8:00 AM  Result Value Ref Range   Vitamin B-12 416 200 - 1,100 pg/mL  VITAMIN D Sheri Hydroxy (Vit-D Deficiency, Fractures)   Collection Time: 09/18/16  8:00 AM  Result Value Ref Range   Vit D, Sheri-Hydroxy 22 (L) 30 - 100 ng/mL    Assessment: 51 y.o. G69P3003 female with menorrhagia, possible adenomyosis, here for pre-operative evaluation.   Plan: Patient will undergo surgical management with TLH/BS/cystoscopy.   The risks of surgery were discussed in detail with the patient including but not limited to: bleeding which may require transfusion or reoperation; infection which may require antibiotics; injury to surrounding organs which may involve bowel, bladder, ureters ; need for additional procedures including laparoscopy or laparotomy; thromboembolic phenomenon, surgical site problems and other postoperative/anesthesia complications. Likelihood of success in alleviating the patient's condition was discussed. Routine postoperative instructions will be reviewed with the patient and her family in detail after surgery.  The patient concurred with the proposed plan, giving informed written consent for the surgery.  Preoperative prophylactic antibiotics and SCDs ordered on call to the OR.    Prentice Docker, MD 09/29/2016 11:48 AM

## 2016-10-09 MED ORDER — GENTAMICIN SULFATE 40 MG/ML IJ SOLN
INTRAVENOUS | Status: DC
  Administered 2016-10-10: 100 mL via INTRAVENOUS
  Filled 2016-10-09 (×2): qty 13.75

## 2016-10-10 ENCOUNTER — Ambulatory Visit: Payer: BLUE CROSS/BLUE SHIELD | Admitting: Anesthesiology

## 2016-10-10 ENCOUNTER — Observation Stay
Admission: RE | Admit: 2016-10-10 | Discharge: 2016-10-11 | Disposition: A | Discharge status: Home / Self Care | Payer: BLUE CROSS/BLUE SHIELD | Source: Ambulatory Visit | Attending: Obstetrics and Gynecology | Admitting: Obstetrics and Gynecology

## 2016-10-10 ENCOUNTER — Encounter
Admission: RE | Disposition: A | Discharge status: Home / Self Care | Payer: Self-pay | Source: Ambulatory Visit | Attending: Obstetrics and Gynecology

## 2016-10-10 ENCOUNTER — Encounter: Payer: Self-pay | Admitting: Anesthesiology

## 2016-10-10 DIAGNOSIS — F419 Anxiety disorder, unspecified: Secondary | ICD-10-CM | POA: Insufficient documentation

## 2016-10-10 DIAGNOSIS — Z90710 Acquired absence of both cervix and uterus: Secondary | ICD-10-CM | POA: Diagnosis present

## 2016-10-10 DIAGNOSIS — N920 Excessive and frequent menstruation with regular cycle: Secondary | ICD-10-CM | POA: Diagnosis not present

## 2016-10-10 DIAGNOSIS — I10 Essential (primary) hypertension: Secondary | ICD-10-CM | POA: Insufficient documentation

## 2016-10-10 DIAGNOSIS — D251 Intramural leiomyoma of uterus: Secondary | ICD-10-CM | POA: Insufficient documentation

## 2016-10-10 DIAGNOSIS — Z955 Presence of coronary angioplasty implant and graft: Secondary | ICD-10-CM | POA: Insufficient documentation

## 2016-10-10 DIAGNOSIS — I252 Old myocardial infarction: Secondary | ICD-10-CM | POA: Insufficient documentation

## 2016-10-10 DIAGNOSIS — N800 Endometriosis of uterus: Secondary | ICD-10-CM | POA: Insufficient documentation

## 2016-10-10 DIAGNOSIS — N838 Other noninflammatory disorders of ovary, fallopian tube and broad ligament: Secondary | ICD-10-CM | POA: Diagnosis not present

## 2016-10-10 DIAGNOSIS — I2510 Atherosclerotic heart disease of native coronary artery without angina pectoris: Secondary | ICD-10-CM | POA: Insufficient documentation

## 2016-10-10 DIAGNOSIS — N994 Postprocedural pelvic peritoneal adhesions: Secondary | ICD-10-CM | POA: Diagnosis not present

## 2016-10-10 DIAGNOSIS — N736 Female pelvic peritoneal adhesions (postinfective): Secondary | ICD-10-CM | POA: Diagnosis not present

## 2016-10-10 DIAGNOSIS — Z7902 Long term (current) use of antithrombotics/antiplatelets: Secondary | ICD-10-CM | POA: Insufficient documentation

## 2016-10-10 DIAGNOSIS — F329 Major depressive disorder, single episode, unspecified: Secondary | ICD-10-CM | POA: Diagnosis not present

## 2016-10-10 DIAGNOSIS — Z79899 Other long term (current) drug therapy: Secondary | ICD-10-CM | POA: Diagnosis not present

## 2016-10-10 DIAGNOSIS — Z87891 Personal history of nicotine dependence: Secondary | ICD-10-CM | POA: Insufficient documentation

## 2016-10-10 DIAGNOSIS — N946 Dysmenorrhea, unspecified: Secondary | ICD-10-CM | POA: Insufficient documentation

## 2016-10-10 DIAGNOSIS — N921 Excessive and frequent menstruation with irregular cycle: Secondary | ICD-10-CM | POA: Diagnosis not present

## 2016-10-10 DIAGNOSIS — Z6841 Body Mass Index (BMI) 40.0 and over, adult: Secondary | ICD-10-CM

## 2016-10-10 DIAGNOSIS — Z7982 Long term (current) use of aspirin: Secondary | ICD-10-CM | POA: Insufficient documentation

## 2016-10-10 DIAGNOSIS — E039 Hypothyroidism, unspecified: Secondary | ICD-10-CM | POA: Diagnosis not present

## 2016-10-10 DIAGNOSIS — E785 Hyperlipidemia, unspecified: Secondary | ICD-10-CM | POA: Insufficient documentation

## 2016-10-10 HISTORY — PX: CYSTOSCOPY: SHX5120

## 2016-10-10 HISTORY — PX: LAPAROSCOPIC HYSTERECTOMY: SHX1926

## 2016-10-10 LAB — CBC
HEMATOCRIT: 37.5 % (ref 35.0–47.0)
Hemoglobin: 12.8 g/dL (ref 12.0–16.0)
MCH: 28.5 pg (ref 26.0–34.0)
MCHC: 34.2 g/dL (ref 32.0–36.0)
MCV: 83.5 fL (ref 80.0–100.0)
Platelets: 255 10*3/uL (ref 150–440)
RBC: 4.49 MIL/uL (ref 3.80–5.20)
RDW: 13.2 % (ref 11.5–14.5)
WBC: 13.2 10*3/uL — AB (ref 3.6–11.0)

## 2016-10-10 LAB — POCT PREGNANCY, URINE: PREG TEST UR: NEGATIVE

## 2016-10-10 LAB — BASIC METABOLIC PANEL
ANION GAP: 11 (ref 5–15)
BUN: 20 mg/dL (ref 6–20)
CALCIUM: 8.7 mg/dL — AB (ref 8.9–10.3)
CO2: 23 mmol/L (ref 22–32)
Chloride: 102 mmol/L (ref 101–111)
Creatinine, Ser: 0.82 mg/dL (ref 0.44–1.00)
GFR calc Af Amer: >60 mL/min (ref >60)
GFR calc non Af Amer: >60 mL/min (ref >60)
GLUCOSE: 195 mg/dL — AB (ref 65–99)
Potassium: 4.1 mmol/L (ref 3.5–5.1)
Sodium: 136 mmol/L (ref 135–145)

## 2016-10-10 LAB — ABO/RH: ABO/RH(D): B NEG

## 2016-10-10 SURGERY — HYSTERECTOMY, TOTAL, LAPAROSCOPIC
Anesthesia: General | Laterality: Bilateral

## 2016-10-10 MED ORDER — SIMETHICONE 80 MG PO CHEW: 80.0000 mg | CHEWABLE_TABLET | Freq: Four times a day (QID) | ORAL | Status: DC | PRN

## 2016-10-10 MED ORDER — PROPOFOL 10 MG/ML IV BOLUS
INTRAVENOUS | Status: DC | PRN
  Administered 2016-10-10: 180 mg via INTRAVENOUS

## 2016-10-10 MED ORDER — PROPOFOL 10 MG/ML IV BOLUS
INTRAVENOUS | Status: AC
  Filled 2016-10-10: qty 20

## 2016-10-10 MED ORDER — BUPIVACAINE HCL (PF) 0.5 % IJ SOLN
INTRAMUSCULAR | Status: AC
  Filled 2016-10-10: qty 30

## 2016-10-10 MED ORDER — METOPROLOL TARTRATE 50 MG PO TABS
25.0000 mg | ORAL_TABLET | Freq: Two times a day (BID) | ORAL | Status: DC
  Administered 2016-10-10 – 2016-10-11 (×2): 25 mg via ORAL
  Filled 2016-10-10 (×2): qty 1

## 2016-10-10 MED ORDER — KETOROLAC TROMETHAMINE 30 MG/ML IJ SOLN
INTRAMUSCULAR | Status: AC
  Filled 2016-10-10: qty 1

## 2016-10-10 MED ORDER — HYDROMORPHONE HCL 1 MG/ML IJ SOLN
INTRAMUSCULAR | Status: DC | PRN
  Administered 2016-10-10: .4 mg via INTRAVENOUS
  Administered 2016-10-10: .2 mg via INTRAVENOUS

## 2016-10-10 MED ORDER — ONDANSETRON HCL 4 MG/2ML IJ SOLN
INTRAMUSCULAR | Status: DC | PRN
  Administered 2016-10-10: 4 mg via INTRAVENOUS

## 2016-10-10 MED ORDER — LIDOCAINE HCL (PF) 2 % IJ SOLN
INTRAMUSCULAR | Status: AC
  Filled 2016-10-10: qty 2

## 2016-10-10 MED ORDER — ROSUVASTATIN CALCIUM 20 MG PO TABS
20.0000 mg | ORAL_TABLET | Freq: Every day | ORAL | Status: DC
  Administered 2016-10-10: 20 mg via ORAL
  Filled 2016-10-10: qty 1

## 2016-10-10 MED ORDER — ESCITALOPRAM OXALATE 10 MG PO TABS
10.0000 mg | ORAL_TABLET | Freq: Every day | ORAL | Status: DC
  Administered 2016-10-11: 10 mg via ORAL
  Filled 2016-10-10: qty 1

## 2016-10-10 MED ORDER — BUPIVACAINE HCL 0.5 % IJ SOLN
INTRAMUSCULAR | Status: DC | PRN
  Administered 2016-10-10: 12 mL

## 2016-10-10 MED ORDER — MIDAZOLAM HCL 2 MG/2ML IJ SOLN
INTRAMUSCULAR | Status: DC | PRN
  Administered 2016-10-10: 2 mg via INTRAVENOUS

## 2016-10-10 MED ORDER — DEXAMETHASONE SODIUM PHOSPHATE 10 MG/ML IJ SOLN
INTRAMUSCULAR | Status: AC
Start: 2016-10-10 — End: ?
  Filled 2016-10-10: qty 1

## 2016-10-10 MED ORDER — DEXAMETHASONE SODIUM PHOSPHATE 10 MG/ML IJ SOLN
INTRAMUSCULAR | Status: AC
  Filled 2016-10-10: qty 1

## 2016-10-10 MED ORDER — LACTATED RINGERS IV SOLN: INTRAVENOUS | Status: DC

## 2016-10-10 MED ORDER — LACTATED RINGERS IV SOLN
INTRAVENOUS | Status: DC
  Administered 2016-10-10 (×2): via INTRAVENOUS

## 2016-10-10 MED ORDER — FAMOTIDINE 20 MG PO TABS
ORAL_TABLET | ORAL | Status: AC
  Administered 2016-10-10: 20 mg via ORAL
  Filled 2016-10-10: qty 1

## 2016-10-10 MED ORDER — HYDROMORPHONE HCL 1 MG/ML IJ SOLN
INTRAMUSCULAR | Status: AC
  Filled 2016-10-10: qty 1

## 2016-10-10 MED ORDER — ROCURONIUM BROMIDE 100 MG/10ML IV SOLN
INTRAVENOUS | Status: DC | PRN
  Administered 2016-10-10: 50 mg via INTRAVENOUS
  Administered 2016-10-10 (×3): 10 mg via INTRAVENOUS

## 2016-10-10 MED ORDER — MIDAZOLAM HCL 2 MG/2ML IJ SOLN
INTRAMUSCULAR | Status: AC
  Filled 2016-10-10: qty 2

## 2016-10-10 MED ORDER — FENTANYL CITRATE (PF) 100 MCG/2ML IJ SOLN
INTRAMUSCULAR | Status: AC
  Filled 2016-10-10: qty 2

## 2016-10-10 MED ORDER — MENTHOL 3 MG MT LOZG
1.0000 | LOZENGE | OROMUCOSAL | Status: DC | PRN
  Filled 2016-10-10: qty 9

## 2016-10-10 MED ORDER — METHYLENE BLUE 0.5 % INJ SOLN
INTRAVENOUS | Status: AC
  Filled 2016-10-10: qty 10

## 2016-10-10 MED ORDER — ONDANSETRON HCL 4 MG PO TABS: 4.0000 mg | ORAL_TABLET | Freq: Four times a day (QID) | ORAL | Status: DC | PRN

## 2016-10-10 MED ORDER — CLOPIDOGREL BISULFATE 75 MG PO TABS
75.0000 mg | ORAL_TABLET | Freq: Every day | ORAL | Status: DC
  Administered 2016-10-11: 75 mg via ORAL
  Filled 2016-10-10: qty 1

## 2016-10-10 MED ORDER — ACETAMINOPHEN 10 MG/ML IV SOLN
INTRAVENOUS | Status: DC | PRN
  Administered 2016-10-10: 1000 mg via INTRAVENOUS

## 2016-10-10 MED ORDER — FAMOTIDINE 20 MG PO TABS
20.0000 mg | ORAL_TABLET | Freq: Once | ORAL | Status: AC
  Administered 2016-10-10: 20 mg via ORAL

## 2016-10-10 MED ORDER — LIDOCAINE HCL (CARDIAC) 20 MG/ML IV SOLN
INTRAVENOUS | Status: DC | PRN
  Administered 2016-10-10: 100 mg via INTRAVENOUS

## 2016-10-10 MED ORDER — SUGAMMADEX SODIUM 500 MG/5ML IV SOLN
INTRAVENOUS | Status: AC
  Filled 2016-10-10: qty 5

## 2016-10-10 MED ORDER — SUGAMMADEX SODIUM 500 MG/5ML IV SOLN
INTRAVENOUS | Status: DC | PRN
  Administered 2016-10-10: 250 mg via INTRAVENOUS

## 2016-10-10 MED ORDER — OXYCODONE-ACETAMINOPHEN 5-325 MG PO TABS
1.0000 | ORAL_TABLET | ORAL | Status: DC | PRN
  Administered 2016-10-10 – 2016-10-11 (×3): 2 via ORAL
  Filled 2016-10-10 (×3): qty 2

## 2016-10-10 MED ORDER — STERILE WATER FOR IRRIGATION IR SOLN
Status: DC | PRN
  Administered 2016-10-10: 240 mL via INTRAVESICAL

## 2016-10-10 MED ORDER — GENTAMICIN SULFATE 40 MG/ML IJ SOLN
550.0000 mg | INTRAVENOUS | Status: AC
  Filled 2016-10-10: qty 13.75

## 2016-10-10 MED ORDER — ACETAMINOPHEN NICU IV SYRINGE 10 MG/ML
INTRAVENOUS | Status: AC
  Filled 2016-10-10: qty 1

## 2016-10-10 MED ORDER — FENTANYL CITRATE (PF) 100 MCG/2ML IJ SOLN
25.0000 ug | INTRAMUSCULAR | Status: DC | PRN
  Administered 2016-10-10 (×2): 25 ug via INTRAVENOUS

## 2016-10-10 MED ORDER — CLINDAMYCIN PHOSPHATE 900 MG/50ML IV SOLN: 900.0000 mg | INTRAVENOUS | Status: AC

## 2016-10-10 MED ORDER — SODIUM CHLORIDE 0.9 % IJ SOLN
INTRAMUSCULAR | Status: AC
Start: 2016-10-10 — End: ?
  Filled 2016-10-10: qty 10

## 2016-10-10 MED ORDER — ONDANSETRON HCL 4 MG/2ML IJ SOLN
INTRAMUSCULAR | Status: AC
  Filled 2016-10-10: qty 2

## 2016-10-10 MED ORDER — DEXAMETHASONE SODIUM PHOSPHATE 10 MG/ML IJ SOLN
INTRAMUSCULAR | Status: DC | PRN
  Administered 2016-10-10: 10 mg via INTRAVENOUS

## 2016-10-10 MED ORDER — ASPIRIN 81 MG PO CHEW
81.0000 mg | CHEWABLE_TABLET | Freq: Every day | ORAL | Status: DC
  Administered 2016-10-11: 81 mg via ORAL
  Filled 2016-10-10: qty 1

## 2016-10-10 MED ORDER — LEVOTHYROXINE SODIUM 25 MCG PO TABS
25.0000 ug | ORAL_TABLET | Freq: Every day | ORAL | Status: DC
  Administered 2016-10-11: 25 ug via ORAL
  Filled 2016-10-10: qty 1

## 2016-10-10 MED ORDER — PHENYLEPHRINE HCL 10 MG/ML IJ SOLN
INTRAMUSCULAR | Status: DC | PRN
  Administered 2016-10-10 (×3): 100 ug via INTRAVENOUS

## 2016-10-10 MED ORDER — SUCCINYLCHOLINE CHLORIDE 20 MG/ML IJ SOLN
INTRAMUSCULAR | Status: AC
  Filled 2016-10-10: qty 1

## 2016-10-10 MED ORDER — ONDANSETRON HCL 4 MG/2ML IJ SOLN: 4.0000 mg | Freq: Once | INTRAMUSCULAR | Status: DC | PRN

## 2016-10-10 MED ORDER — ONDANSETRON HCL 4 MG/2ML IJ SOLN: 4.0000 mg | Freq: Four times a day (QID) | INTRAMUSCULAR | Status: DC | PRN

## 2016-10-10 MED ORDER — ROCURONIUM BROMIDE 50 MG/5ML IV SOLN
INTRAVENOUS | Status: AC
Start: 2016-10-10 — End: ?
  Filled 2016-10-10: qty 1

## 2016-10-10 MED ORDER — CLINDAMYCIN PHOSPHATE 900 MG/50ML IV SOLN
INTRAVENOUS | Status: AC
  Filled 2016-10-10: qty 50

## 2016-10-10 MED ORDER — FENTANYL CITRATE (PF) 100 MCG/2ML IJ SOLN
INTRAMUSCULAR | Status: DC | PRN
  Administered 2016-10-10 (×2): 50 ug via INTRAVENOUS

## 2016-10-10 MED ORDER — HYDROMORPHONE HCL 1 MG/ML IJ SOLN
1.0000 mg | INTRAMUSCULAR | Status: DC | PRN
  Administered 2016-10-10: 1 mg via INTRAVENOUS
  Filled 2016-10-10: qty 1

## 2016-10-10 MED ORDER — GLYCOPYRROLATE 0.2 MG/ML IJ SOLN
INTRAMUSCULAR | Status: AC
  Filled 2016-10-10: qty 1

## 2016-10-10 MED ORDER — IBUPROFEN 600 MG PO TABS: 600.0000 mg | ORAL_TABLET | Freq: Four times a day (QID) | ORAL | Status: DC | PRN

## 2016-10-10 MED ORDER — DOCUSATE SODIUM 100 MG PO CAPS
100.0000 mg | ORAL_CAPSULE | Freq: Two times a day (BID) | ORAL | Status: DC
Start: 2016-10-10 — End: 2016-10-11
  Administered 2016-10-10 – 2016-10-11 (×2): 100 mg via ORAL
  Filled 2016-10-10 (×2): qty 1

## 2016-10-10 MED ORDER — ROCURONIUM BROMIDE 50 MG/5ML IV SOLN
INTRAVENOUS | Status: AC
  Filled 2016-10-10: qty 1

## 2016-10-10 SURGICAL SUPPLY — 52 items
BAG URO DRAIN 2000ML W/SPOUT (MISCELLANEOUS) ×3 IMPLANT
BLADE SURG SZ11 CARB STEEL (BLADE) ×3 IMPLANT
CATH FOLEY 2WAY  5CC 16FR (CATHETERS) ×1
CATH URTH 16FR FL 2W BLN LF (CATHETERS) ×2 IMPLANT
CHLORAPREP W/TINT 26ML (MISCELLANEOUS) ×3 IMPLANT
DEFOGGER SCOPE WARMER CLEARIFY (MISCELLANEOUS) ×3 IMPLANT
DERMABOND ADVANCED (GAUZE/BANDAGES/DRESSINGS) ×1
DERMABOND ADVANCED .7 DNX12 (GAUZE/BANDAGES/DRESSINGS) ×2 IMPLANT
DEVICE SUTURE ENDOST 10MM (ENDOMECHANICALS) ×3 IMPLANT
DRAPE LEGGINS SURG 28X43 STRL (DRAPES) ×3 IMPLANT
DRAPE SHEET LG 3/4 BI-LAMINATE (DRAPES) ×3 IMPLANT
DRAPE UNDER BUTTOCK W/FLU (DRAPES) ×3 IMPLANT
GLOVE BIO SURGEON STRL SZ7 (GLOVE) ×9 IMPLANT
GLOVE INDICATOR 7.5 STRL GRN (GLOVE) ×12 IMPLANT
GOWN STRL REUS W/ TWL LRG LVL3 (GOWN DISPOSABLE) ×6 IMPLANT
GOWN STRL REUS W/TWL LRG LVL3 (GOWN DISPOSABLE) ×3
GRASPER SUT TROCAR 14GX15 (MISCELLANEOUS) ×3 IMPLANT
IRRIGATION STRYKERFLOW (MISCELLANEOUS) ×2 IMPLANT
IRRIGATOR STRYKERFLOW (MISCELLANEOUS) ×3
IV LACTATED RINGERS 1000ML (IV SOLUTION) ×6 IMPLANT
KIT PINK PAD W/HEAD ARE REST (MISCELLANEOUS) ×3
KIT PINK PAD W/HEAD ARM REST (MISCELLANEOUS) ×2 IMPLANT
KIT RM TURNOVER CYSTO AR (KITS) ×3 IMPLANT
LABEL OR SOLS (LABEL) ×3 IMPLANT
LIGASURE BLUNT 5MM 37CM (INSTRUMENTS) ×3 IMPLANT
MANIPULATOR VCARE LG CRV RETR (MISCELLANEOUS) IMPLANT
MANIPULATOR VCARE SML CRV RETR (MISCELLANEOUS) IMPLANT
MANIPULATOR VCARE STD CRV RETR (MISCELLANEOUS) IMPLANT
NDL SAFETY 22GX1.5 (NEEDLE) ×3 IMPLANT
OCCLUDER COLPOPNEUMO (BALLOONS) ×3 IMPLANT
PACK LAP CHOLECYSTECTOMY (MISCELLANEOUS) ×3 IMPLANT
PAD OB MATERNITY 4.3X12.25 (PERSONAL CARE ITEMS) ×3 IMPLANT
PAD PREP 24X41 OB/GYN DISP (PERSONAL CARE ITEMS) ×3 IMPLANT
SCISSORS METZENBAUM CVD 33 (INSTRUMENTS) ×3 IMPLANT
SET CYSTO W/LG BORE CLAMP LF (SET/KITS/TRAYS/PACK) ×3 IMPLANT
SLEEVE ENDOPATH XCEL 5M (ENDOMECHANICALS) ×3 IMPLANT
SOL PREP PVP 2OZ (MISCELLANEOUS) ×3
SOLUTION ELECTROLUBE (MISCELLANEOUS) ×3 IMPLANT
SOLUTION PREP PVP 2OZ (MISCELLANEOUS) ×2 IMPLANT
SURGILUBE 2OZ TUBE FLIPTOP (MISCELLANEOUS) ×3 IMPLANT
SUT ENDO VLOC 180-0-8IN (SUTURE) ×3 IMPLANT
SUT MNCRL 4-0 (SUTURE) ×1
SUT MNCRL 4-0 27XMFL (SUTURE) ×2
SUT VIC AB 0 CT1 36 (SUTURE) ×3 IMPLANT
SUTURE MNCRL 4-0 27XMF (SUTURE) ×2 IMPLANT
SYR 50ML LL SCALE MARK (SYRINGE) ×6 IMPLANT
SYRINGE 10CC LL (SYRINGE) ×6 IMPLANT
TROCAR 5M 150ML BLDLS (TROCAR) ×3 IMPLANT
TROCAR ENDO BLADELESS 11MM (ENDOMECHANICALS) ×3 IMPLANT
TROCAR XCEL NON-BLD 5MMX100MML (ENDOMECHANICALS) ×3 IMPLANT
TUBING INSUF HEATED (TUBING) ×3 IMPLANT
WATER STERILE IRR 1000ML POUR (IV SOLUTION) ×3 IMPLANT

## 2016-10-10 NOTE — Anesthesia Post-op Follow-up Note (Signed)
Anesthesia QCDR form completed.        

## 2016-10-10 NOTE — Transfer of Care (Signed)
Immediate Anesthesia Transfer of Care Note  Patient: Sheri [name]  Procedure(s) Performed: Procedure(s): HYSTERECTOMY TOTAL LAPAROSCOPIC BILATERAL SALPINGECTOMY (Bilateral) CYSTOSCOPY  Patient Location: PACU  Anesthesia Type:General  Level of Consciousness: sedated  Airway & Oxygen Therapy: Patient Spontanous Breathing and Patient connected to face mask oxygen  Post-op Assessment: Report given to RN and Post -op Vital signs reviewed and stable  Post vital signs: Reviewed and stable  Last Vitals:  Vitals:   10/10/16 1100 10/10/16 1512  BP:  125/75  Pulse:  81  Resp:  16  Temp: (!) 35.9 C 37.1 C  SpO2:  97%    Last Pain:  Vitals:   10/10/16 1512  TempSrc: Temporal         Complications: No apparent anesthesia complications

## 2016-10-10 NOTE — H&P (View-Only) (Signed)
Preoperative History and Physical  Sheri [name] is a 51 y.o. [phone] here for surgical management of menorrhagia.   She has a history of myocardial infarction, but has been cleared by her cardiologist.   History of Present Illness: 51 y.o. G36P3003 female who presents with recent episodes of bleeding after having no bleeding in a long time.   She has a history of endometrial ablation in approximately 2012.  She had an episode of bleeding and pelvic pain in 2016. The gynecologic workup was normal.  However, she believes that was related to kidney stones.  A couple of months ago she noted some light pink vaginal bleeding and some slight cramping.  Then her bleeding stopped. Shortly after this episode, she began having heavy vaginal bleeding associated with severe lower abdominal and pelvic pain.  She states that she could not get out of bed at one point.  Since that time she has had mild dark-brown spotting with much less pain.  She states that the pain was like a contractions and having a baby.  The pain did not radiate and it was severe.  She took ibuprofen 600 mg every 6-8 hours and this seemed to help with the pain.  Nothing seemed to make the pain worse.  She felt nauseated with the pain. That was her only associated symptom.  She denies weight loss (has gained weight), denies bloating and constipation. She has noted some early satiety.  Denies any inciting event.  She denies current hot flashes and other symptoms of menopause. She states that she used to have these symptoms and they started when she was in her late 54's. She had a pelvic ultrasound that was remarkable for an anteverted, globular uterus with an endometrial strope of 12.1 mm with a heterogeneous echotexture to the myometrium.  She had a negative pap smear in July this year, TSH slightly elevated, endometrial biopsy with extremely scant viable tissue (status post ablation).  She has been cleared by her cardiologist for surgery.  Proposed  surgery: Total laparoscopic hysterectomy, bilateral salpingectomy, cystoscopy  Past Medical History:  Diagnosis Date  . Abnormal uterine bleeding   . Heart attack ([place]) 03/2014  . Heart disease   . History of C-section   . HLD (hyperlipidemia)   . Hx of heart artery stent   . Hypertension    Past Surgical History:  Procedure Laterality Date  . CARDIAC SURGERY    . CESAREAN SECTION    . CHOLECYSTECTOMY    . CYSTOSCOPY WITH STENT PLACEMENT Left 11/01/2015   Procedure: CYSTOSCOPY WITH STENT PLACEMENT;  Surgeon: Hollice Espy, MD;  Location: [facility] ORS;  Service: Urology;  Laterality: Left;  . CYSTOSCOPY/RETROGRADE/URETEROSCOPY Bilateral 11/01/2015   Procedure: CYSTOSCOPY/RETROGRADE/URETEROSCOPY;  Surgeon: Hollice Espy, MD;  Location: [facility] ORS;  Service: Urology;  Laterality: Bilateral;  . DILATION AND CURETTAGE OF UTERUS  2011  . ENDOMETRIAL ABLATION  2011  . LEFT HEART CATHETERIZATION WITH CORONARY ANGIOGRAM N/A 04/06/2014   Procedure: LEFT HEART CATHETERIZATION WITH CORONARY ANGIOGRAM;  Surgeon: Jettie Booze, MD;  Location: [facility] CATH LAB;  Service: Cardiovascular;  Laterality: N/A;   OB History  Gravida Para Term Preterm AB Living  3 3 3     3   SAB TAB Ectopic Multiple Live Births          3    # Outcome Date GA Lbr Len/2nd Weight Sex Delivery Anes PTL Lv  3 Term      CS-LTranv   LIV  2 Term  CS-LTranv   LIV  1 Term      CS-LTranv   LIV    Patient denies any other pertinent gynecologic issues.   Current Outpatient Prescriptions on File Prior to Visit  Medication Sig Dispense Refill  . aspirin 81 MG chewable tablet Chew 1 tablet (81 mg total) by mouth daily.    . clopidogrel (PLAVIX) 75 MG tablet TAKE 1 TABLET (75 MG TOTAL) BY MOUTH DAILY. 30 tablet 10  . dicyclomine (BENTYL) 10 MG capsule Take 1 capsule (10 mg total) by mouth 4 (four) times daily -  before meals and at bedtime. (Patient not taking: Reported on 09/26/2016) 30 capsule 0  . escitalopram (LEXAPRO) 10 MG  tablet Take 1 tablet (10 mg total) by mouth daily. 30 tablet 11  . isosorbide mononitrate (IMDUR) 30 MG 24 hr tablet TAKE 1 TABLET (30 MG TOTAL) BY MOUTH DAILY. 90 tablet 3  . levothyroxine (SYNTHROID, LEVOTHROID) 25 MCG tablet Take 1 tablet (25 mcg total) by mouth daily before breakfast. 30 tablet 5  . lisinopril (PRINIVIL,ZESTRIL) 5 MG tablet Take 1 tablet (5 mg total) by mouth daily. 30 tablet 8  . metoprolol tartrate (LOPRESSOR) 25 MG tablet Take 1 tablet (25 mg total) by mouth 2 (two) times daily. 180 tablet 3  . Multiple Vitamin (MULTIVITAMIN WITH MINERALS) TABS tablet Take 1 tablet by mouth daily.    . nitroGLYCERIN (NITROSTAT) 0.4 MG SL tablet Place 1 tablet (0.4 mg total) under the tongue every 5 (five) minutes x 3 doses as needed for chest pain. 25 tablet 2  . Probiotic Product (ALIGN) 4 MG CAPS Take 4 mg by mouth 2 (two) times daily.    . rosuvastatin (CRESTOR) 20 MG tablet Take 1 tablet (20 mg total) by mouth daily. 30 tablet 0  . rosuvastatin (CRESTOR) 20 MG tablet Take 20 mg by mouth daily.     No current facility-administered medications on file prior to visit.    Allergies  Allergen Reactions  . Latex Dermatitis  . Penicillins Rash    Has patient had a PCN reaction causing immediate rash, facial/tongue/throat swelling, SOB or lightheadedness with hypotension: Yes Has patient had a PCN reaction causing severe rash involving mucus membranes or skin necrosis: Unknown Has patient had a PCN reaction that required hospitalization: No Has patient had a PCN reaction occurring within the last 10 years: No If all of the above answers are "NO", then may proceed with Cephalosporin use.     Social History:   reports that she has quit smoking. She has never used smokeless tobacco. She reports that she does not drink alcohol or use drugs.  Family History  Problem Relation Age of Onset  . Heart attack Father 77       died of MI at age 108  . Hypertension Mother   . Hyperlipidemia  Mother   . Hypothyroidism Mother   . Kidney Stones Sister   . Kidney Stones Brother   . Stroke Paternal Grandfather   . Liver cancer Maternal Grandmother   . Kidney disease Neg Hx   . Bladder Cancer Neg Hx     Review of Systems: Noncontributory  PHYSICAL EXAM: BP 124/78   Ht 5\' 5"  (1.651 m)   Wt 241 lb (109.3 kg)   BMI 40.10 kg/m   CONSTITUTIONAL: Well-developed, well-nourished female in no acute distress.  HENT:  Normocephalic, atraumatic, External right and left ear normal. Oropharynx is clear and moist EYES: Conjunctivae and EOM are normal. Pupils are equal, round,  and reactive to light. No scleral icterus.  NECK: Normal range of motion, supple, no masses SKIN: Skin is warm and dry. No rash noted. Not diaphoretic. No erythema. No pallor. [place]: Alert and oriented to person, place, and time. Normal reflexes, muscle tone coordination. No cranial nerve deficit noted. PSYCHIATRIC: Normal mood and affect. Normal behavior. Normal judgment and thought content. CARDIOVASCULAR: Normal heart rate noted, regular rhythm RESPIRATORY: Effort and breath sounds normal, no problems with respiration noted ABDOMEN: Soft, nontender, nondistended. PELVIC: Deferred MUSCULOSKELETAL: Normal range of motion. No edema and no tenderness. 2+ distal pulses.  Labs: Results for orders placed or performed in visit on 09/21/16 (from the past 336 hour(s))  Lipid panel   Collection Time: 09/21/16  2:24 PM  Result Value Ref Range   Cholesterol, Total 148 100 - 199 mg/dL   Triglycerides 120 0 - 149 mg/dL   HDL 36 (L) >39 mg/dL   VLDL Cholesterol Cal 24 5 - 40 mg/dL   LDL Calculated 88 0 - 99 mg/dL   Chol/HDL Ratio 4.1 0.0 - 4.4 ratio  Hepatic function panel   Collection Time: 09/21/16  2:24 PM  Result Value Ref Range   Total Protein 7.1 6.0 - 8.5 g/dL   Albumin 4.3 3.5 - 5.5 g/dL   Bilirubin Total 0.3 0.0 - 1.2 mg/dL   Bilirubin, Direct 0.09 0.00 - 0.40 mg/dL   Alkaline Phosphatase 101 39 - 117  IU/L   AST 34 0 - 40 IU/L   ALT 44 (H) 0 - 32 IU/L  Results for orders placed or performed in visit on 09/18/16 (from the past 336 hour(s))  T4, free   Collection Time: 09/18/16  8:00 AM  Result Value Ref Range   Free T4 1.1 0.8 - 1.8 ng/dL  Results for orders placed or performed in visit on 09/18/16 (from the past 336 hour(s))  TSH   Collection Time: 09/18/16  8:00 AM  Result Value Ref Range   TSH 5.77 (H) mIU/L  Hemoglobin A1c   Collection Time: 09/18/16  8:00 AM  Result Value Ref Range   Hgb A1c MFr Bld 5.3 <5.7 %   Mean Plasma Glucose 105 mg/dL  Vitamin B12   Collection Time: 09/18/16  8:00 AM  Result Value Ref Range   Vitamin B-12 416 200 - 1,100 pg/mL  VITAMIN D 25 Hydroxy (Vit-D Deficiency, Fractures)   Collection Time: 09/18/16  8:00 AM  Result Value Ref Range   Vit D, 25-Hydroxy 22 (L) 30 - 100 ng/mL    Assessment: 51 y.o. G51P3003 female with menorrhagia, possible adenomyosis, here for pre-operative evaluation.   Plan: Patient will undergo surgical management with TLH/BS/cystoscopy.   The risks of surgery were discussed in detail with the patient including but not limited to: bleeding which may require transfusion or reoperation; infection which may require antibiotics; injury to surrounding organs which may involve bowel, bladder, ureters ; need for additional procedures including laparoscopy or laparotomy; thromboembolic phenomenon, surgical site problems and other postoperative/anesthesia complications. Likelihood of success in alleviating the patient's condition was discussed. Routine postoperative instructions will be reviewed with the patient and her family in detail after surgery.  The patient concurred with the proposed plan, giving informed written consent for the surgery.  Preoperative prophylactic antibiotics and SCDs ordered on call to the OR.    Prentice Docker, MD 09/29/2016 11:48 AM

## 2016-10-10 NOTE — Anesthesia Preprocedure Evaluation (Addendum)
Anesthesia Evaluation  Patient identified by MRN, date of birth, ID band Patient awake    Reviewed: Allergy & Precautions, NPO status , Patient's Chart, lab work & pertinent test results, reviewed documented beta blocker date and time   Airway Mallampati: III  TM Distance: >3 FB     Dental  (+) Chipped, Missing   Pulmonary shortness of breath, former smoker,           Cardiovascular hypertension, Pt. on medications and Pt. on home beta blockers + CAD, + Past MI and + Cardiac Stents  + dysrhythmias      Neuro/Psych PSYCHIATRIC DISORDERS Anxiety Depression    GI/Hepatic   Endo/Other  Hypothyroidism   Renal/GU Renal disease     Musculoskeletal   Abdominal   Peds  Hematology   Anesthesia Other Findings Obese. Does not take NTG now.  Reproductive/Obstetrics                            Anesthesia Physical Anesthesia Plan  ASA: III  Anesthesia Plan: General   Post-op Pain Management:    Induction: Intravenous  PONV Risk Score and Plan:   Airway Management Planned: Oral ETT  Additional Equipment:   Intra-op Plan:   Post-operative Plan:   Informed Consent: I have reviewed the patients History and Physical, chart, labs and discussed the procedure including the risks, benefits and alternatives for the proposed anesthesia with the patient or authorized representative who has indicated his/her understanding and acceptance.     Plan Discussed with: CRNA  Anesthesia Plan Comments:         Anesthesia Quick Evaluation

## 2016-10-10 NOTE — Op Note (Signed)
Operative Note    Pre-Op Diagnosis:  1) menorrhagia with irregular cycle 2) dysmenorrhea  Post-Op Diagnosis:  1) menorrhagia with irregular cycle 2) dysmenorrhea  Procedures:  1) Total Laparoscopic Hysterectomy 2) bilateral salpingectomy 3) significant lysis of adhesion (> 45 minutes) 4) cystoscopy  Primary Surgeon: Prentice Docker, MD   Assistant Surgeon: Barnett Applebaum, MD  EBL: 100 mL   IVF: 1,100 mL crystalloid  Urine output: 200 mL  Specimens: uterus with cervix, bilateral fallopian tubes  Drains: none  Complications: None   Disposition: PACU   Condition: Stable   Findings:  1) globally enlarged uterus 2) significant adhesion of bladder and anterior pelvic peritoneum to anterior lower uterus 3) normal appearing bilateral ovaries 4) normal appearing fallopian tubes 5) bladder wall with no defects and visualization of efflux bilaterally from ureteral orifices on cystoscopy  Procedure Summary:  The patient was taken to the operating room where general anesthesia was administered and found to be adequate. She was placed in the dorsal supine lithotomy position in [employer] stirrups and prepped and draped in usual sterile fashion. After a timeout was called, an indwelling catheter was placed in her bladder. A sterile speculum was placed in the vagina and a single-tooth tenaculum was used to grasp the anterior lip of the cervix. A V-Care uterine manipulator was affixed to the uterus in accordance to the manufacturers recommendations. The speculum and tenaculum was removed from the vagina.  Attention was turned to the abdomen where, after injection of local anesthetic, a 5 mm infraumbilical incision was made with the scalpel. Entry into the abdomen was obtained via Optiview trocar technique (a blunt entry technique with camera visualization through the obturator upon entry). Verification of entry into the abdomen was obtained using opening pressures. The abdomen was  insufflated with CO2. The camera was introduced through the trocar with verification of atraumatic entry. A left lower quadrant 5 mm port was created via direct intra-abdominal camera visualization without difficulty. An 11 mm right lower quadrant port was placed in a similar fashion without difficulty.  After inspection of the abdomen and pelvis with the above-noted findings, the bilateral ureters were identified and found to be well away from the operative area of interest. Initial lysis of anterior adhesion undertaken to ensure the procedure would be possible laparoscopically.  The left fallopian tube was grasped at the fimbriated end and was transected using the LigaSure along the mesosalpinx in a lateral to medial fashion. The LigaSure then was used to transect the left round ligament and the utero-ovarian ligament was transected. Tissue was divided along the left broad ligament to nearly the level of the internal cervical os. Adhesions from the anterior uterus were further taken down with great care and the lower uterine segment was identified. In order to accomplish lysis of adhesions from the lower uterine segment, the bladder was back-filled with indigo carmine solution (240 mL).  Scar tissue and bladder tissue were dissected off the lower uterine segment and cervix without difficulty. The left uterine artery was skeletonized and identified and after ligation was transected with the LigaSure device. The same procedure was carried out on the right side. The indigo carmine solution was removed from the bladder.  The colpotomy was performed using monopolar electrocautery in a circumferential fashion following the KOH ring.  The vaginal occluder balloon was insufflated prior to colpotomy to prevent loss of pneumoperitoneum. The uterus and fallopian tubes and cervix were removed through the vagina.  Cystoscopy was undertaken at this point. The Foley catheter  was removed and the 70 cystoscope was gently  introduced through the urethra. The bladder survey was undertaken with efflux of urine from both orifices noted. There were no defects noted in the bladder wall. The cystoscope was removed and the Foley catheter was replaced.  Attention was returned to the pelvis and closure of the vaginal cuff was undertaken using the V-lock stitch in a running fashion. Of note, a single 5 mm suprapubic port was added under direct intra-abdominal camera visualization without difficulty.  This was added to aid in visualization of the closure of the vaginal cuff.  A gloved hand was placed in the vagina to assess adequate closure of the vaginal cuff. This was found to be satisfactory. All vascular pedicles were inspected and found to be hemostatic. Irrigation was undertaken and hemostasis was again verified. The right lower quadrant trocar was removed and the fascia was reapproximated using #0 Vicryl with a single stitch. The abdomen was then desufflated of CO2 after removal of all instruments. Five deep breaths were given by anesthesia to the patient to help with removal of CO2 from the abdomen. The right lower quadrant skin incision was closed using 4-0 monocryl in a subcuticular fashion. The remaining skin incisions were closed using surgical skin glue and a layer of surgical skin glue was placed over the right lower quadrant skin incision, as well. The catheter was then removed from the bladder. The vagina was inspected and found to be free of instrumentation and sponges.  The patient tolerated the procedure well.  Sponge, lap, needle, and instrument counts were correct x 2.  VTE prophylaxis: SCDs. Antibiotic prophylaxis: clindamycin and gentamicin. She was awakened in the operating room and was taken to the PACU in stable condition.   Prentice Docker, MD 10/10/2016 3:00 PM

## 2016-10-10 NOTE — Anesthesia Procedure Notes (Signed)
Procedure Name: Intubation Date/Time: 10/10/2016 12:21 PM Performed by: Hedda Slade Pre-anesthesia Checklist: Patient identified, Patient being monitored, Timeout performed, Emergency Drugs available and Suction available Patient Re-evaluated:Patient Re-evaluated prior to induction Oxygen Delivery Method: Circle system utilized Preoxygenation: Pre-oxygenation with 100% oxygen Induction Type: IV induction Ventilation: Mask ventilation without difficulty Laryngoscope Size: Mac and 3 Grade View: Grade I Tube type: Oral Tube size: 7.0 mm Number of attempts: 1 Airway Equipment and Method: Stylet Placement Confirmation: ETT inserted through vocal cords under direct vision,  positive ETCO2 and breath sounds checked- equal and bilateral Secured at: 21 cm Tube secured with: Tape Dental Injury: Teeth and Oropharynx as per pre-operative assessment

## 2016-10-10 NOTE — Interval H&P Note (Signed)
History and Physical Interval Note:  10/10/2016 11:30 AM  Sheri [name]  has presented today for surgery, with the diagnosis of [place]  The various methods of treatment have been discussed with the patient and family. After consideration of risks, benefits and other options for treatment, the patient has consented to  Procedure(s): HYSTERECTOMY TOTAL LAPAROSCOPIC BILATERAL SALPINGECTOMY (Bilateral) as a surgical intervention .  The patient's history has been reviewed, patient examined, no change in status, stable for surgery.  I have reviewed the patient's chart and labs.  Questions were answered to the patient's satisfaction.   The patient's last dose of Plavix was on 9/4.  She continues to take aspirin, metoprolol, crestor.  The patient has been cleared by her cardiologist for the procedure. Will monitor her overnight with telemetry post-operatively.  Prentice Docker, MD 10/10/2016 11:32 AM

## 2016-10-11 ENCOUNTER — Encounter: Payer: Self-pay | Admitting: Obstetrics and Gynecology

## 2016-10-11 DIAGNOSIS — N946 Dysmenorrhea, unspecified: Secondary | ICD-10-CM | POA: Diagnosis not present

## 2016-10-11 DIAGNOSIS — E039 Hypothyroidism, unspecified: Secondary | ICD-10-CM | POA: Diagnosis not present

## 2016-10-11 DIAGNOSIS — N800 Endometriosis of uterus: Secondary | ICD-10-CM | POA: Diagnosis not present

## 2016-10-11 DIAGNOSIS — N921 Excessive and frequent menstruation with irregular cycle: Secondary | ICD-10-CM | POA: Diagnosis not present

## 2016-10-11 DIAGNOSIS — N736 Female pelvic peritoneal adhesions (postinfective): Secondary | ICD-10-CM | POA: Diagnosis not present

## 2016-10-11 DIAGNOSIS — D251 Intramural leiomyoma of uterus: Secondary | ICD-10-CM | POA: Diagnosis not present

## 2016-10-11 DIAGNOSIS — I252 Old myocardial infarction: Secondary | ICD-10-CM | POA: Diagnosis not present

## 2016-10-11 DIAGNOSIS — I2510 Atherosclerotic heart disease of native coronary artery without angina pectoris: Secondary | ICD-10-CM | POA: Diagnosis not present

## 2016-10-11 DIAGNOSIS — I10 Essential (primary) hypertension: Secondary | ICD-10-CM | POA: Diagnosis not present

## 2016-10-11 DIAGNOSIS — Z955 Presence of coronary angioplasty implant and graft: Secondary | ICD-10-CM | POA: Diagnosis not present

## 2016-10-11 DIAGNOSIS — Z7902 Long term (current) use of antithrombotics/antiplatelets: Secondary | ICD-10-CM | POA: Diagnosis not present

## 2016-10-11 DIAGNOSIS — E785 Hyperlipidemia, unspecified: Secondary | ICD-10-CM | POA: Diagnosis not present

## 2016-10-11 DIAGNOSIS — Z7982 Long term (current) use of aspirin: Secondary | ICD-10-CM | POA: Diagnosis not present

## 2016-10-11 DIAGNOSIS — Z87891 Personal history of nicotine dependence: Secondary | ICD-10-CM | POA: Diagnosis not present

## 2016-10-11 DIAGNOSIS — Z79899 Other long term (current) drug therapy: Secondary | ICD-10-CM | POA: Diagnosis not present

## 2016-10-11 DIAGNOSIS — N838 Other noninflammatory disorders of ovary, fallopian tube and broad ligament: Secondary | ICD-10-CM | POA: Diagnosis not present

## 2016-10-11 LAB — BASIC METABOLIC PANEL
Anion gap: 7 (ref 5–15)
BUN: 17 mg/dL (ref 6–20)
CO2: 27 mmol/L (ref 22–32)
Calcium: 9 mg/dL (ref 8.9–10.3)
Chloride: 103 mmol/L (ref 101–111)
Creatinine, Ser: 0.78 mg/dL (ref 0.44–1.00)
GFR calc Af Amer: >60 mL/min (ref >60)
GFR calc non Af Amer: >60 mL/min (ref >60)
Glucose, Bld: 165 mg/dL — ABNORMAL HIGH (ref 65–99)
Potassium: 4.1 mmol/L (ref 3.5–5.1)
Sodium: 137 mmol/L (ref 135–145)

## 2016-10-11 LAB — CBC
HCT: 37.7 % (ref 35.0–47.0)
Hemoglobin: 13.2 g/dL (ref 12.0–16.0)
MCH: 29.6 pg (ref 26.0–34.0)
MCHC: 34.9 g/dL (ref 32.0–36.0)
MCV: 84.8 fL (ref 80.0–100.0)
Platelets: 265 10*3/uL (ref 150–440)
RBC: 4.45 MIL/uL (ref 3.80–5.20)
RDW: 13 % (ref 11.5–14.5)
WBC: 13.8 10*3/uL — ABNORMAL HIGH (ref 3.6–11.0)

## 2016-10-11 MED ORDER — ONDANSETRON HCL 4 MG PO TABS: 4.0000 mg | ORAL_TABLET | Freq: Four times a day (QID) | ORAL | 0 refills | Status: DC | PRN

## 2016-10-11 MED ORDER — IBUPROFEN 600 MG PO TABS: 600.0000 mg | ORAL_TABLET | Freq: Four times a day (QID) | ORAL | 0 refills | Status: DC | PRN

## 2016-10-11 MED ORDER — OXYCODONE-ACETAMINOPHEN 5-325 MG PO TABS: 2.0000 | ORAL_TABLET | ORAL | 0 refills | Status: DC | PRN

## 2016-10-11 NOTE — Discharge Summary (Signed)
DC Summary Discharge Summary   Patient ID: Sheri [name] [id number] 51 y.o. 01/04/1966  Admit date: 10/10/2016  Discharge date: 10/11/2016  Principal Diagnoses:  1) menorrhagia with irregular cycles 2) dysmenorrhea  Secondary Diagnoses:  CAD  Procedures performed during the hospitalization:  TLH/BS Postoperative care with telemetry  HPI: 51 y.o.[id number] female who presents with recent episodes of bleeding after having no bleeding in a long time. She has a history of endometrial ablation in approximately 2012. She had an episode of bleeding and pelvic pain in 2016. The gynecologic workup was normal. However, she believes that was related to kidney stones. A couple of months ago she noted some light pink vaginal bleeding and some slight cramping. Then her bleeding stopped. Shortly after this episode, she began having heavy vaginal bleeding associated with severe lower abdominal and pelvic pain. She states that she could not get out of bed at one point. Since that time she has had mild dark-brown spotting with much less pain. She states that the pain was like a contractions and having a baby. The pain did not radiate and it was severe. She took ibuprofen 600 mg every 6-8 hours and this seemed to help with the pain. Nothing seemed to make the pain worse. She felt nauseated with the pain. That was her only associated symptom. She denies weight loss (has gained weight), denies bloating and constipation. She has noted some early satiety. Denies any inciting event. She denies current hot flashes and other symptoms of menopause. She states that she used to have these symptoms and they started when she was in her late 32's. She had a pelvic ultrasound that was remarkable for an anteverted, globular uterus with an endometrial strope of 12.1 mm with a heterogeneous echotexture to the myometrium.  She had a negative pap smear in July this year, TSH slightly elevated, endometrial biopsy with  extremely scant viable tissue (status post ablation).  She has been cleared by her cardiologist for surgery.  Past Medical History:  Diagnosis Date  . Abnormal uterine bleeding   . Anxiety   . Coronary artery disease   . Depression   . Dyspnea    with exertion  . Dysrhythmia   . Heart attack ([place]) 03/2014  . Heart disease   . History of C-section   . History of kidney stones   . HLD (hyperlipidemia)   . Hx of heart artery stent   . Hypertension   . Hypothyroidism     Past Surgical History:  Procedure Laterality Date  . CARDIAC CATHETERIZATION    . CARDIAC SURGERY    . [place]  . CHOLECYSTECTOMY    . CORONARY ANGIOPLASTY    . CYSTOSCOPY WITH STENT PLACEMENT Left 11/01/2015   Procedure: CYSTOSCOPY WITH STENT PLACEMENT;  Surgeon: Hollice Espy, MD;  Location: [facility] ORS;  Service: Urology;  Laterality: Left;  . CYSTOSCOPY/RETROGRADE/URETEROSCOPY Bilateral 11/01/2015   Procedure: CYSTOSCOPY/RETROGRADE/URETEROSCOPY;  Surgeon: Hollice Espy, MD;  Location: [facility] ORS;  Service: Urology;  Laterality: Bilateral;  . DILATION AND CURETTAGE OF UTERUS  2011  . ENDOMETRIAL ABLATION  2011  . LEFT HEART CATHETERIZATION WITH CORONARY ANGIOGRAM N/A 04/06/2014   Procedure: LEFT HEART CATHETERIZATION WITH CORONARY ANGIOGRAM;  Surgeon: Jettie Booze, MD;  Location: [facility] CATH LAB;  Service: Cardiovascular;  Laterality: N/A;    Allergies  Allergen Reactions  . Latex Dermatitis  . Penicillins Rash    Has patient had a PCN reaction causing immediate rash, facial/tongue/throat  swelling, SOB or lightheadedness with hypotension: Yes Has patient had a PCN reaction causing severe rash involving mucus membranes or skin necrosis: Unknown Has patient had a PCN reaction that required hospitalization: No Has patient had a PCN reaction occurring within the last 10 years: No If all of the above answers are "NO", then may proceed with Cephalosporin use.     Social History   Substance Use Topics  . Smoking status: Former Smoker    Packs/day: 1.00    Types: Cigarettes  . Smokeless tobacco: Never Used     Comment: quit 20 years  . Alcohol use No    Family History  Problem Relation Age of Onset  . Heart attack Father 47       died of MI at age 4  . Hypertension Mother   . Hyperlipidemia Mother   . Hypothyroidism Mother   . Kidney Stones Sister   . Kidney Stones Brother   . Stroke Paternal Grandfather   . Liver cancer Maternal Grandmother   . Kidney disease Neg Hx   . Bladder Cancer Neg Hx     Hospital Course:  Admitted on 10/10/16 for the above surgery, which occurred without incident. She was monitored overnight postoperatively with telemetry. By POD#1 she was ambulating, voiding, tolerating PO, and her pain was under good control with PO pain medication.  Her vitals were stable, as well as her telemetry.  She had stable labs.  She was discharged in stable conditino.   Discharge Exam: BP 119/67 (BP Location: Right Arm)   Pulse 78   Temp 98.3 F (36.8 C) (Oral)   Resp 18   Ht 5\' 5"  (1.651 m)   Wt 241 lb (109.3 kg)   LMP 09/13/2016   SpO2 94%   BMI 40.10 kg/m  General  no apparent distress   CV  RRR   Pulmonary  clear to ausculatation bllaterally   Abdomen  Bowel sounds: present  Incisions: clean, dry, intact with mild ecchymoses surrounding her lateral incision sites  Extremities  no edema, symmetric, SCDs in place    Condition at Discharge: Stable  Complications affecting treatment: None  Discharge Medications:  Allergies as of 10/11/2016      Reactions   Latex Dermatitis   Penicillins Rash   Has patient had a PCN reaction causing immediate rash, facial/tongue/throat swelling, SOB or lightheadedness with hypotension: Yes Has patient had a PCN reaction causing severe rash involving mucus membranes or skin necrosis: Unknown Has patient had a PCN reaction that required hospitalization: No Has patient had a PCN reaction occurring  within the last 10 years: No If all of the above answers are "NO", then may proceed with Cephalosporin use.      Medication List    TAKE these medications   ALIGN 4 MG Caps Take 4 mg by mouth 2 (two) times daily.   aspirin 81 MG chewable tablet Chew 1 tablet (81 mg total) by mouth daily.   clopidogrel 75 MG tablet Commonly known as:  PLAVIX TAKE 1 TABLET (75 MG TOTAL) BY MOUTH DAILY.   dicyclomine 10 MG capsule Commonly known as:  BENTYL Take 1 capsule (10 mg total) by mouth 4 (four) times daily -  before meals and at bedtime.   escitalopram 10 MG tablet Commonly known as:  LEXAPRO Take 1 tablet (10 mg total) by mouth daily.   ibuprofen 600 MG tablet Commonly known as:  ADVIL,MOTRIN Take 1 tablet (600 mg total) by mouth every 6 (six) hours as  needed (mild pain).   isosorbide mononitrate 30 MG 24 hr tablet Commonly known as:  IMDUR TAKE 1 TABLET (30 MG TOTAL) BY MOUTH DAILY.   levothyroxine 25 MCG tablet Commonly known as:  SYNTHROID, LEVOTHROID Take 1 tablet (25 mcg total) by mouth daily before breakfast.   lisinopril 5 MG tablet Commonly known as:  PRINIVIL,ZESTRIL Take 1 tablet (5 mg total) by mouth daily.   metoprolol tartrate 25 MG tablet Commonly known as:  LOPRESSOR Take 1 tablet (25 mg total) by mouth 2 (two) times daily.   multivitamin with minerals Tabs tablet Take 1 tablet by mouth daily.   nitroGLYCERIN 0.4 MG SL tablet Commonly known as:  NITROSTAT Place 1 tablet (0.4 mg total) under the tongue every 5 (five) minutes x 3 doses as needed for chest pain.   ondansetron 4 MG tablet Commonly known as:  ZOFRAN Take 1 tablet (4 mg total) by mouth every 6 (six) hours as needed for nausea.   oxyCODONE-acetaminophen 5-325 MG tablet Commonly known as:  PERCOCET/ROXICET Take 2 tablets by mouth every 4 (four) hours as needed (breakthrough pain).   rosuvastatin 20 MG tablet Commonly known as:  CRESTOR Take 20 mg by mouth at bedtime. What changed:  Another  medication with the same name was removed. Continue taking this medication, and follow the directions you see here.            Discharge Care Instructions        Start     Ordered   10/11/16 0000  ibuprofen (ADVIL,MOTRIN) 600 MG tablet  Every 6 hours PRN     10/11/16 0831   10/11/16 0000  ondansetron (ZOFRAN) 4 MG tablet  Every 6 hours PRN     10/11/16 0831   10/11/16 0000  oxyCODONE-acetaminophen (PERCOCET/ROXICET) 5-325 MG tablet  Every 4 hours PRN     10/11/16 0831   10/11/16 0000  Diet general     10/11/16 0831   10/11/16 0000  Increase activity slowly     10/11/16 0831   10/11/16 0000  May shower / [place]athe     10/11/16 0831   10/11/16 0000  Driving Restrictions    Comments:  No driving while taking narcotic pain medication   10/11/16 0831   10/11/16 0000  Sexual Activity Restrictions    Comments:  No sexual activity for 8 weeks   10/11/16 0831   10/11/16 0000  Call MD for:  temperature >100.4     10/11/16 0831   10/11/16 0000  Call MD for:  persistant nausea and vomiting     10/11/16 0831   10/11/16 0000  Call MD for:  severe uncontrolled pain     10/11/16 0831   10/11/16 0000  Call MD for:  redness, tenderness, or signs of infection (pain, swelling, redness, odor or green/yellow discharge around incision site)     10/11/16 0831   10/11/16 0000  Call MD for:  difficulty breathing, headache or visual disturbances     10/11/16 0831   10/11/16 0000  Call MD for:  hives     10/11/16 0831   10/11/16 0000  Call MD for:  persistant dizziness or light-headedness     10/11/16 0831   10/11/16 0000  Call MD for:  extreme fatigue     10/11/16 0831       Follow-up arrangements:  Follow-up Information    Will Bonnet, MD Follow up in 1 week(s).   Specialty:  Obstetrics and Gynecology Why:  incision  check Contact information: [address] [place] [address] [phone]          Discharge Disposition: Home to self care  Signed: Prentice Docker 10/11/2016 8:32 AM

## 2016-10-12 LAB — SURGICAL PATHOLOGY

## 2016-10-12 NOTE — Anesthesia Postprocedure Evaluation (Addendum)
Anesthesia Post Note  Patient: Sheri [name]  Procedure(s) Performed: Procedure(s) (LRB): HYSTERECTOMY TOTAL LAPAROSCOPIC BILATERAL SALPINGECTOMY (Bilateral) CYSTOSCOPY  Patient location during evaluation: PACU Anesthesia Type: General Level of consciousness: awake and alert Pain management: pain level controlled Vital Signs Assessment: post-procedure vital signs reviewed and stable Respiratory status: spontaneous breathing, nonlabored ventilation, respiratory function stable and patient connected to nasal cannula oxygen Cardiovascular status: blood pressure returned to baseline and stable Postop Assessment: no apparent nausea or vomiting Anesthetic complications: no     Last Vitals:  Vitals:   10/11/16 1010 10/11/16 1011  BP: 121/69 121/69  Pulse: 69 69  Resp:    Temp:    SpO2:      Last Pain:  Vitals:   10/11/16 0932  TempSrc:   PainSc: 4                  Martha Clan

## 2016-10-17 ENCOUNTER — Ambulatory Visit (INDEPENDENT_AMBULATORY_CARE_PROVIDER_SITE_OTHER): Payer: BLUE CROSS/BLUE SHIELD | Admitting: Obstetrics and Gynecology

## 2016-10-17 VITALS — BP 128/84 | Ht 65.0 in | Wt 254.0 lb

## 2016-10-17 DIAGNOSIS — Z90710 Acquired absence of both cervix and uterus: Secondary | ICD-10-CM

## 2016-10-17 NOTE — Progress Notes (Signed)
   Postoperative Follow-up Patient presents post op from [facility], bilateral salpingectomy 7days ago for abnormal uterine bleeding.  Subjective: Patient reports marked improvement in her preop symptoms. Eating a regular diet without difficulty. The patient is not having any pain.  Activity: slowly increasing. Denies fevers, chills, nausea, vomiting, chest pain, trouble breathing..  Objective: Vitals:   10/17/16 0853  BP: 128/84   Vital Signs: BP 128/84   Ht 5\' 5"  (1.651 m)   Wt 254 lb (115.2 kg)   BMI 42.27 kg/m  Constitutional: Well nourished, well developed female in no acute distress.  HEENT: normal Skin: Warm and dry.  Extremity: no edema  Abdomen: obese, soft, nontender, nondistended, +BS clean, dry, intact and all without erythema, induration, warmth, and tednerness  Assessment: 51 y.o. s/p TLH/BS progressing well  Plan: Patient has done well after surgery with no apparent complications.  I have discussed the post-operative course to date, and the expected progress moving forward.  The patient understands what complications to be concerned about.  I will see the patient in routine follow up, or sooner if needed.    Activity plan: increase activity slowly, pelvic rest until 8 weeks postop  Return in about 5 weeks (around 11/21/2016) for 6 week postop visit.   Prentice Docker, MD 10/17/2016, 9:10 AM

## 2016-10-31 ENCOUNTER — Other Ambulatory Visit: Payer: BLUE CROSS/BLUE SHIELD

## 2016-10-31 DIAGNOSIS — E039 Hypothyroidism, unspecified: Secondary | ICD-10-CM | POA: Diagnosis not present

## 2016-10-31 LAB — TSH: TSH: 2.92 m[IU]/L

## 2016-10-31 LAB — T4, FREE: FREE T4: 1 ng/dL (ref 0.8–1.8)

## 2016-11-02 ENCOUNTER — Other Ambulatory Visit: Payer: Self-pay | Admitting: Interventional Cardiology

## 2016-11-03 ENCOUNTER — Other Ambulatory Visit: Payer: Self-pay | Admitting: Interventional Cardiology

## 2016-11-07 ENCOUNTER — Ambulatory Visit (INDEPENDENT_AMBULATORY_CARE_PROVIDER_SITE_OTHER): Payer: BLUE CROSS/BLUE SHIELD | Admitting: Family Medicine

## 2016-11-07 ENCOUNTER — Other Ambulatory Visit: Payer: Self-pay | Admitting: Family Medicine

## 2016-11-07 ENCOUNTER — Encounter: Payer: Self-pay | Admitting: Family Medicine

## 2016-11-07 VITALS — BP 128/79 | HR 72 | Temp 98.0°F | Resp 16 | Ht 65.0 in | Wt 255.6 lb

## 2016-11-07 DIAGNOSIS — F329 Major depressive disorder, single episode, unspecified: Secondary | ICD-10-CM | POA: Diagnosis not present

## 2016-11-07 DIAGNOSIS — Z23 Encounter for immunization: Secondary | ICD-10-CM

## 2016-11-07 DIAGNOSIS — F32A Depression, unspecified: Secondary | ICD-10-CM

## 2016-11-07 DIAGNOSIS — E559 Vitamin D deficiency, unspecified: Secondary | ICD-10-CM

## 2016-11-07 DIAGNOSIS — E039 Hypothyroidism, unspecified: Secondary | ICD-10-CM

## 2016-11-07 DIAGNOSIS — F419 Anxiety disorder, unspecified: Secondary | ICD-10-CM | POA: Diagnosis not present

## 2016-11-07 DIAGNOSIS — R5382 Chronic fatigue, unspecified: Secondary | ICD-10-CM

## 2016-11-07 MED ORDER — ESCITALOPRAM OXALATE 20 MG PO TABS: 20.0000 mg | ORAL_TABLET | Freq: Every day | ORAL | 11 refills | Status: DC

## 2016-11-07 NOTE — Assessment & Plan Note (Signed)
Recent worsening anxiety and persistent depression, compounded by insomnia poor sleep among other factors, likely hypothyroid Elevated GAD score - No failed meds. No psychiatry / therapy - Chronic mood problem s/p MI 03/2014  Plan: 1. Increase dose Escitalopram from 10 to 20mg  daily - sent new rx, discussion on time until effect, potential side effect, goal of treatment 2. Declines therapist or counseling - will reconsider if need 3. Follow-up 8 weeks depression/anxiety, med adjust - thyroid labs, may need to add buspar if anxiety is still overwhelming

## 2016-11-07 NOTE — Progress Notes (Signed)
Subjective:    Patient ID: Sheri [name], female    DOB: July 08, 1965, 51 y.o.   MRN: [id number]  Sheri [name] is a 51 y.o. female presenting on 11/07/2016 for Hypothyroidism   HPI   FOLLOW-UP HYPOTHYROIDISM / Fatigue / Depression / Anxiety - Last visit with me 09/21/16, for annual physical and also evaluated same problems, primarily new dx elevated TSH , treated with new start Levothyroxine 74mcg daily, see prior notes for background information. - Interval update with notable improvement in fatigue and symptoms overall in first 1-2 weeks on med, then seemed gradual reduced effectiveness especially over past 2-3 weeks. Does admit several other significant life stressors affecting her now worsening anxiety, describes husband working now as Restaurant manager, fast food and going back / forth to [place]. Her children are back to school. Usually she is good at coping, but now having more problems with this - Today patient reports concern on Levothyroxine dose, recent labs did show normal TSH / Free T4, see result. Also asking about Vitamin D, she is still taking D3 5,000 daily now for 5-6 weeks, was advised to take for 12 weeks, have not re-checked yet - Additionally admits mood swings and some "weeping" again with crying spells seems her mood is still "off" ever since heart attack 2016 has had problems regulating mood, has already seen Cardiology pertaining this question - Admits occasional brief sweating episodes temperature instability not consistent with "hot flashes", states she has seen GYN. Chart review shows she is s/p hysterectomy 09/2016, as likely contributing factor - Asking about contrave today and wt loss, had fluctuation wt down to 245 then up to 255 - Admits temperature instability, tiredness, weight gain - Denies any chest pain, dyspnea, headache, near syncope, vision changes, cough, irritability or anger, agitation, hallucinations/perception problems.  Additional history - Still considering sleep  apnea as possible factor, but not focus today, will re-address at future visit if symptoms still unresolved  Health Maintenance: - Due for Flu Shot, will receive today - Due for Mammogram, pending [facility]  GAD 7 : Generalized Anxiety Score 11/07/2016 06/19/2016 06/21/2015  Nervous, Anxious, on Edge 3 3 2   Control/stop worrying 3 0 3  Worry too much - different things 3 0 3  Trouble relaxing 3 1 3   Restless 0 0 2  Easily annoyed or irritable 0 0 3  Afraid - awful might happen 3 0 3  Total GAD 7 Score 15 4 19   Anxiety Difficulty Not difficult at all Not difficult at all Very difficult    Depression screen [facility] 2/9 11/07/2016 09/21/2016 06/19/2016  Decreased Interest 0 2 1  Down, Depressed, Hopeless 1 2 0  PHQ - 2 Score 1 4 1   Altered sleeping 3 3 3   Tired, decreased energy 3 3 3   Change in appetite 0 3 2  Feeling bad or failure about yourself  0 1 0  Trouble concentrating 0 0 1  Moving slowly or fidgety/restless 0 0 0  Suicidal thoughts 0 0 0  PHQ-9 Score 7 14 10   Difficult doing work/chores Not difficult at all Somewhat difficult -    Social History  Substance Use Topics  . Smoking status: Former Smoker    Packs/day: 1.00    Types: Cigarettes  . Smokeless tobacco: Never Used     Comment: quit 20 years  . Alcohol use No    Review of Systems Per HPI unless specifically indicated above     Objective:    BP 128/79  Pulse 72   Temp 98 F (36.7 C) (Oral)   Resp 16   Ht 5\' 5"  (1.651 m)   Wt 255 lb 9.6 oz (115.9 kg)   LMP 09/13/2016   BMI 42.53 kg/m   Wt Readings from Last 3 Encounters:  11/07/16 255 lb 9.6 oz (115.9 kg)  10/17/16 254 lb (115.2 kg)  10/10/16 241 lb (109.3 kg)    Physical Exam  Constitutional: She is oriented to person, place, and time. She appears well-developed and well-nourished. No distress.  Well-appearing, comfortable, cooperative, obese  HENT:  Head: Normocephalic and atraumatic.  Mouth/Throat: Oropharynx is clear and moist.  Eyes:  Conjunctivae are normal.  Neck: Normal range of motion. Neck supple. No thyromegaly present.  Cardiovascular: Normal rate, regular rhythm, normal heart sounds and intact distal pulses.   No murmur heard. Pulmonary/Chest: Effort normal and breath sounds normal. No respiratory distress. She has no wheezes. She has no rales.  Musculoskeletal: She exhibits no edema.  Lymphadenopathy:    She has no cervical adenopathy.  Neurological: She is alert and oriented to person, place, and time.  Skin: Skin is warm and dry. No rash noted. She is not diaphoretic. No erythema.  Psychiatric: She has a normal mood and affect. Her behavior is normal.  Well groomed, good eye contact, normal speech and thoughts. Mildly anxious appearing but cooperative.  Nursing note and vitals reviewed.    Results for orders placed or performed during the hospital encounter of 10/10/16  CBC  Result Value Ref Range   WBC 13.2 (H) 3.6 - 11.0 K/uL   RBC 4.49 3.80 - 5.20 MIL/uL   Hemoglobin 12.8 12.0 - 16.0 g/dL   HCT 37.5 35.0 - 47.0 %   MCV 83.5 80.0 - 100.0 fL   MCH 28.5 26.0 - 34.0 pg   MCHC 34.2 32.0 - 36.0 g/dL   RDW 13.2 11.5 - 14.5 %   Platelets 255 150 - 440 K/uL  Basic metabolic panel  Result Value Ref Range   Sodium 136 135 - 145 mmol/L   Potassium 4.1 3.5 - 5.1 mmol/L   Chloride 102 101 - 111 mmol/L   CO2 23 22 - 32 mmol/L   Glucose, Bld 195 (H) 65 - 99 mg/dL   BUN 20 6 - 20 mg/dL   Creatinine, Ser 0.82 0.44 - 1.00 mg/dL   Calcium 8.7 (L) 8.9 - 10.3 mg/dL   GFR calc non Af Amer >60 >60 mL/min   GFR calc Af Amer >60 >60 mL/min   Anion gap 11 5 - 15  CBC  Result Value Ref Range   WBC 13.8 (H) 3.6 - 11.0 K/uL   RBC 4.45 3.80 - 5.20 MIL/uL   Hemoglobin 13.2 12.0 - 16.0 g/dL   HCT 37.7 35.0 - 47.0 %   MCV 84.8 80.0 - 100.0 fL   MCH 29.6 26.0 - 34.0 pg   MCHC 34.9 32.0 - 36.0 g/dL   RDW 13.0 11.5 - 14.5 %   Platelets 265 150 - 440 K/uL  Basic metabolic panel  Result Value Ref Range   Sodium 137 135  - 145 mmol/L   Potassium 4.1 3.5 - 5.1 mmol/L   Chloride 103 101 - 111 mmol/L   CO2 27 22 - 32 mmol/L   Glucose, Bld 165 (H) 65 - 99 mg/dL   BUN 17 6 - 20 mg/dL   Creatinine, Ser 0.78 0.44 - 1.00 mg/dL   Calcium 9.0 8.9 - 10.3 mg/dL   GFR calc non  Af Amer >60 >60 mL/min   GFR calc Af Amer >60 >60 mL/min   Anion gap 7 5 - 15  Pregnancy, urine POC  Result Value Ref Range   Preg Test, Ur NEGATIVE NEGATIVE  ABO/Rh  Result Value Ref Range   ABO/RH(D) B NEG   Surgical pathology  Result Value Ref Range   SURGICAL PATHOLOGY      Surgical Pathology CASE: [id number] PATIENT: Ketrina Schonberg Surgical Pathology Report     SPECIMEN SUBMITTED: A. Uterus with cervix, bilateral tubes  CLINICAL HISTORY: None provided  PRE-OPERATIVE DIAGNOSIS: Menorrhagia, irregular cycle  POST-OPERATIVE DIAGNOSIS: Same as pre-op     DIAGNOSIS: A. UTERUS WITH CERVIX; HYSTERECTOMY: - CERVIX WITHOUT PATHOLOGIC CHANGES. - PROLIFERATIVE ENDOMETRIUM. - MARKED ADENOMYOSIS. - INTRAMURAL LEIOMYOMA. - SEROSAL ADHESIONS.  RIGHT AND LEFT FALLOPIAN TUBES; SALPINGECTOMY: - PARATUBAL CYSTS.   GROSS DESCRIPTION: A. The specimen is received in a formalin-filled container labeled with the patient's name and uterus with cervix, bilateral tubes. Adnexa: Bilateral fallopian tube with fimbria Weight: 198 grams Dimensions:      Fundus - 8.5 x 8 x 5.3 cm      Cervix - 5.6 x 3 x 2.2 cm Serosa: Pink red and relatively smooth except for small amount of adhesions on the anterior surface Cervix: Elongated and somewhat fl attened. Endocervix: Pale-tan and soft Endometrial cavity:      Dimensions - 6 x 3 x 0.4 cm      Thickness - 0.1 cm      Other findings - unremarkable Myometrium:     Thickness - up to 3.5 cm     Other findings - trabeculated appearance with a single cystic space that measures up to 0.5 cm in diameter, containing hemorrhagic fluid. There is also a single pale tan circumscribed rubbery  intramural nodule that measures 1.5 cm in diameter, which on sectioning have a whorled pattern. Adnexa:      Right fallopian tube: Weighs 1 g           Measurements - 2.6 cm in length x 0.5 cm in diameter           Other findings - there is a paratubal cyst noted in the fimbriated end and measures 1 cm in diameter, containing clear fluid, otherwise unremarkable      Left fallopian tube: Weighs 1 g            Measurements - 5 cm in length x 0.5 and 0.6 cm in diameter           Other findings - there is a paratubal cyst in the fimbriated end that measures 0.3 cm in diameter, containing  clear fluid, otherwise unremarkable  Block summary: 1-adhesions on serosa 2-3- cervix 4- lower uterine segments 5-8- endomyometrium 9-cystic space in myometrium and section of myometrial nodule 10-11- entire longitudinally sectioned fimbriated end of right fallopian tube and rest in cross-sections 12-13-entire longitudinal sectioned fimbriated end of left fallopian tube and representative cross-sections   Final Diagnosis performed by Bryan Lemma, MD.  Electronically signed 10/12/2016 8:21:22PM    The electronic signature indicates that the named Attending Pathologist has evaluated the specimen  Technical component performed at [facility], [address] [place], [place] [address] Lab: [phone] Dir: Darrick Penna. Evette Doffing, MD  Professional component performed at [facility], [facility], [place], [place], [place] [address] Lab: [phone] Dir: Dellia Nims. Reuel Derby, MD        Assessment & Plan:   Problem List Items Addressed This Visit  Acquired hypothyroidism    Improved TSH / Free T4 now on levothyroxine 61mcg low dose for past 8 weeks Seems to have improved her constellation of symptoms, but still has problems with wt gain, temperature, anxiety/mood, tiredness - seems multifactorial issues though, with poor lifestyle, stressors worsening, hysterectomy  Plan: 1.  Continue levothyroxine 46mcg daily - no dose change today 2. Return 8 weeks - recheck TSH, Free T4 once more, allow more time to adjust      Anxiety and depression - Primary    Recent worsening anxiety and persistent depression, compounded by insomnia poor sleep among other factors, likely hypothyroid Elevated GAD score - No failed meds. No psychiatry / therapy - Chronic mood problem s/p MI 03/2014  Plan: 1. Increase dose Escitalopram from 10 to 20mg  daily - sent new rx, discussion on time until effect, potential side effect, goal of treatment 2. Declines therapist or counseling - will reconsider if need 3. Follow-up 8 weeks depression/anxiety, med adjust - thyroid labs, may need to add buspar if anxiety is still overwhelming      Relevant Medications   escitalopram (LEXAPRO) 20 MG tablet   Chronic fatigue    Still largely multifactorial - mild improved on levothyroxine Now optimizing anxiety/depression management > inc SSRI, future consider buspar Likely other factors with stressors, lifestyle, wt gain, poor sleep, questionable OSA,  Also replacing Vitamin D still, re-check in 8 weeks      Vitamin D deficiency    Other Visit Diagnoses    Needs flu shot       Relevant Orders   Flu Vaccine QUAD 36+ mos IM (Completed)      Meds ordered this encounter  Medications  . escitalopram (LEXAPRO) 20 MG tablet    Sig: Take 1 tablet (20 mg total) by mouth daily.    Dispense:  30 tablet    Refill:  11    Note dose changed from 10mg  daily to 20mg     Follow up plan: Return in about 8 weeks (around 01/02/2017) for Thyroid labs / VItamin D / Anxiety GAD7/PHQ, weight.  A total of 25 minutes was spent face-to-face with this patient. Greater than 50% of this time was spent in counseling on diagnostics, lab results and management of hypothyroidism, general fatigue / mood/depression vs anxiety, reviewed potential treatments such as inc SSRI vs Buspar, side effects.  [name],  DO [facility] 11/07/2016, 11:59 PM

## 2016-11-07 NOTE — Patient Instructions (Addendum)
Thank you for coming to the clinic today.  1.  Increase Lexapro from 10mg  up to 20mg  - take two of your 10mg  tabs at once daily in morning.   Sent new rx Lexapro 20mg  daily  Also think about new anxiety medicine such as Buspar (Buspirone) can add this to your lexapro next time as well.  2. Consider Contrave cost and coverage with insurance / pharmacy  3. COntinue vitamin D3 5,000 daily for total 12 weeks, through next appointment  4. Continue Levothyroxine 63mcg daily  DUE for NON FASTING BLOOD WORK - Thyroid again and Vitamin D  SCHEDULE "Lab Only" visit in the morning at the clinic for lab draw in [place]  - Make sure Lab Only appointment is at about 1 week before your next appointment, so that results will be available  For Lab Results, once available within 2-3 days of blood draw, you can can log in to MyChart online to view your results and a brief explanation. Also, we can discuss results at next follow-up visit.   Please schedule a Follow-up Appointment to: Return in about 8 weeks (around 01/02/2017) for Thyroid labs / VItamin D / Anxiety GAD7/PHQ, weight.  If you have any other questions or concerns, please feel free to call the clinic or send a message through [place]. You may also schedule an earlier appointment if necessary.  Additionally, you may be receiving a survey about your experience at our clinic within a few days to 1 week by e-mail or mail. We value your feedback.  [name], DO [place]

## 2016-11-07 NOTE — Assessment & Plan Note (Signed)
Still largely multifactorial - mild improved on levothyroxine Now optimizing anxiety/depression management > inc SSRI, future consider buspar Likely other factors with stressors, lifestyle, wt gain, poor sleep, questionable OSA,  Also replacing Vitamin D still, re-check in 8 weeks

## 2016-11-07 NOTE — Assessment & Plan Note (Signed)
Improved TSH / Free T4 now on levothyroxine 70mcg low dose for past 8 weeks Seems to have improved her constellation of symptoms, but still has problems with wt gain, temperature, anxiety/mood, tiredness - seems multifactorial issues though, with poor lifestyle, stressors worsening, hysterectomy  Plan: 1. Continue levothyroxine 61mcg daily - no dose change today 2. Return 8 weeks - recheck TSH, Free T4 once more, allow more time to adjust

## 2016-11-16 DIAGNOSIS — H524 Presbyopia: Secondary | ICD-10-CM | POA: Diagnosis not present

## 2016-11-22 ENCOUNTER — Ambulatory Visit (INDEPENDENT_AMBULATORY_CARE_PROVIDER_SITE_OTHER): Payer: BLUE CROSS/BLUE SHIELD | Admitting: Obstetrics and Gynecology

## 2016-11-22 ENCOUNTER — Encounter: Payer: Self-pay | Admitting: Obstetrics and Gynecology

## 2016-11-22 VITALS — BP 130/82 | Ht 65.0 in | Wt 254.0 lb

## 2016-11-22 DIAGNOSIS — Z90710 Acquired absence of both cervix and uterus: Secondary | ICD-10-CM

## 2016-11-22 NOTE — Progress Notes (Signed)
   Postoperative Follow-up Patient presents post op from TLH/BS/cystoscopy 6weeks ago for menorrhagia with irregular cycle and dysmenorrhea.  Subjective: Patient reports marked improvement in her preop symptoms. Eating a regular diet without difficulty. The patient is not having any pain.  Activity: normal activities of daily living.  Objective: Vitals:   11/22/16 0810  BP: 130/82   Vital Signs: BP 130/82   Ht 5\' 5"  (1.651 m)   Wt 254 lb (115.2 kg)   LMP 09/13/2016   BMI 42.27 kg/m  Constitutional: Well nourished, well developed female in no acute distress.  HEENT: normal Skin: Warm and dry.  Extremity: no edema  Abdomen: Soft, non-tender, normal bowel sounds; no bruits, organomegaly or masses. clean, dry, intact and incisions without erythema, induration, warmth, and tenderness  Pelvic exam: (female chaperone present) is limited by body habitus EGBUS: within normal limits Vagina: within normal limits and with no blood in the vault Vaginal cuff intact without erythema, induration, warmth, and tenderness    Assessment: 51 y.o. s/p TLH/BS/Cystoscopy progressing well  Plan: Patient has done well after surgery with no apparent complications.  I have discussed the post-operative course to date, and the expected progress moving forward.  The patient understands what complications to be concerned about.  I will see the patient in routine follow up, or sooner if needed.    Activity plan: No restriction.  Prentice Docker, MD 11/22/2016, 8:21 AM

## 2016-12-27 ENCOUNTER — Other Ambulatory Visit: Payer: BLUE CROSS/BLUE SHIELD

## 2016-12-27 DIAGNOSIS — E039 Hypothyroidism, unspecified: Secondary | ICD-10-CM | POA: Diagnosis not present

## 2016-12-27 DIAGNOSIS — E559 Vitamin D deficiency, unspecified: Secondary | ICD-10-CM | POA: Diagnosis not present

## 2016-12-28 LAB — T4, FREE: FREE T4: 1.1 ng/dL (ref 0.8–1.8)

## 2016-12-28 LAB — VITAMIN D 25 HYDROXY (VIT D DEFICIENCY, FRACTURES): Vit D, 25-Hydroxy: 43 ng/mL (ref 30–100)

## 2016-12-28 LAB — TSH: TSH: 2.47 m[IU]/L

## 2017-01-02 ENCOUNTER — Encounter: Payer: Self-pay | Admitting: Family Medicine

## 2017-01-02 ENCOUNTER — Ambulatory Visit: Payer: BLUE CROSS/BLUE SHIELD | Admitting: Family Medicine

## 2017-01-02 VITALS — BP 110/68 | HR 57 | Temp 98.0°F | Resp 16 | Ht 65.0 in | Wt 256.8 lb

## 2017-01-02 DIAGNOSIS — F329 Major depressive disorder, single episode, unspecified: Secondary | ICD-10-CM | POA: Diagnosis not present

## 2017-01-02 DIAGNOSIS — F419 Anxiety disorder, unspecified: Secondary | ICD-10-CM

## 2017-01-02 DIAGNOSIS — R5382 Chronic fatigue, unspecified: Secondary | ICD-10-CM | POA: Diagnosis not present

## 2017-01-02 DIAGNOSIS — E039 Hypothyroidism, unspecified: Secondary | ICD-10-CM

## 2017-01-02 DIAGNOSIS — E559 Vitamin D deficiency, unspecified: Secondary | ICD-10-CM

## 2017-01-02 DIAGNOSIS — Z6841 Body Mass Index (BMI) 40.0 and over, adult: Secondary | ICD-10-CM | POA: Diagnosis not present

## 2017-01-02 DIAGNOSIS — F32A Depression, unspecified: Secondary | ICD-10-CM

## 2017-01-02 NOTE — Assessment & Plan Note (Signed)
Stable well controlled TSH / Free T4. Recent diagnosis within past few months Improved but still seems multifactorial issues though  Plan: 1. Continue levothyroxine 37mcg daily 2. Now wait to check labs for up to 3-6 months since stable on multiple checks

## 2017-01-02 NOTE — Assessment & Plan Note (Signed)
Improved, back to normal range >40 Reduce dose Vitamin D3 5k down to 2k daily maintenance OTC

## 2017-01-02 NOTE — Assessment & Plan Note (Signed)
Notable improvement anxiety and persistent depression, now with better sleep improved insomnia, supplements, corrected VIt D and Hypothyroidism - No failed meds. No psychiatry / therapy - Chronic mood problem s/p MI 03/2014  Plan: 1. Continue Escitalopram from 20mg  daily 2. Declines therapist or counseling - will reconsider if need 3. Follow-up 3 months

## 2017-01-02 NOTE — Assessment & Plan Note (Signed)
Still gradual chronic weight gain recently, overall past 2 years following MI and depression - Not PreDM - Now controlled Hypothyroidism, still some wt gain  Plan: 1. Counseling on lifestyle modifications again encouraged to continue improve diet, low carb, low sugar, inc water less soda. - Goals to improve regular exercise with walking, inc brisk pace despite BB that limits heart rate, goal up to 4 mph walking 2. Continue levothyroxine 3. Re-consider options if still not improving after continued lifestyle changes - may reconsider contrave vs referral wt management clinic

## 2017-01-02 NOTE — Patient Instructions (Addendum)
Thank you for coming to the clinic today.  1. Keep up good work!  2. Labs are normal. Great news on Vitamin D is normal, now reduce to Vitamin D3 2,000 daily for maintenance, stop 5,000 daily for now  3. Continue Levothyroxine 28mcg daily - dose is correct it seems  Go ahead and check on scheduling Mammogram with [facility].  Please schedule a Follow-up Appointment to: Return in about 3 months (around 04/02/2017) for Fatigue Weight Check / Thyroid / Mood/ PHQ/GAD.  If you have any other questions or concerns, please feel free to call the clinic or send a message through [place]. You may also schedule an earlier appointment if necessary.  Additionally, you may be receiving a survey about your experience at our clinic within a few days to 1 week by e-mail or mail. We value your feedback.  [name], DO [place]

## 2017-01-02 NOTE — Assessment & Plan Note (Signed)
Improved overall Still multifactorial, seems improved on levothyroxine, Vitamin D correction, and vitamin supplements and better sleep. Also better mood. Continue current meds Follow-up

## 2017-01-02 NOTE — Progress Notes (Signed)
Subjective:    Patient ID: Sheri [name], female    DOB: [birth year], 51 y.o.   MRN: [id number]  Sheri [name] is a 51 y.o. female presenting on 01/02/2017 for Hypothyroidism (follow up lab work)   HPI   FOLLOW-UP HYPOTHYROIDISM / Chronic Fatigue / Depression / Anxiety - Last visit with me 11/07/16, for same problems, recent new dx hypothyroidism, treated with continued Levothyroxine 30mcg daily also vitamin D supplement, see prior notes for background information. - Interval update with notable improvement in fatigue and symptoms overall since last visit. Labs now with normal still TSH and Free T4 on current dose, improved Vitamin D >40 now.  - Today patient reports she feels has more energy, more motivation to be more active and improve her life, her mood and anxiety are controlled see scores below. She states feels thyroid has reduced, and current dose is good. She states her mind no longer feels "cloudy" - Taking Vitamin D3 5,000 iu daily still, ready to reduce to 2k - Taking other OTC supplement chromium, ginkgo biloba, and melatonin low dose nightly for sleep, with notable improvement in sleep. Sleep is worse when husband is gone on travel out of country. - Lifestyle: - Weight gain 1-2 lb in 6 weeks. She is not ready to consider a wt loss medication - Diet improved overall - Exercise: Improved regular walking, has dogs does walk about 1 mile or less, having hard time getting heart rate up due to medications per cardiology with BB, asking about burning calories. Has not returned to gym but will consider this. - Denies any chest pain, dyspnea, headache, near syncope, vision changes, cough, irritability or anger, agitation, hallucinations/perception problems  Health Maintenance: - Due for Mammogram, previously completed at her GYN [facility], last within 1 year, now due for repeat, we do not have copy of last record, she has already scheduled  Depression screen [facility] 2/9 01/02/2017 11/07/2016  09/21/2016  Decreased Interest 0 0 2  Down, Depressed, Hopeless 1 1 2   PHQ - 2 Score 1 1 4   Altered sleeping 1 3 3   Tired, decreased energy 1 3 3   Change in appetite 0 0 3  Feeling bad or failure about yourself  0 0 1  Trouble concentrating 0 0 0  Moving slowly or fidgety/restless 0 0 0  Suicidal thoughts 0 0 0  PHQ-9 Score 3 7 14   Difficult doing work/chores Not difficult at all Not difficult at all Somewhat difficult   GAD 7 : Generalized Anxiety Score 01/02/2017 11/07/2016 06/19/2016 06/21/2015  Nervous, Anxious, on Edge 1 3 3 2   Control/stop worrying 1 3 0 3  Worry too much - different things 1 3 0 3  Trouble relaxing 1 3 1 3   Restless 0 0 0 2  Easily annoyed or irritable 0 0 0 3  Afraid - awful might happen 1 3 0 3  Total GAD 7 Score 5 15 4 19   Anxiety Difficulty Not difficult at all Not difficult at all Not difficult at all Very difficult    Social History   Tobacco Use  . Smoking status: Former Smoker    Packs/day: 1.00    Types: Cigarettes  . Smokeless tobacco: Never Used  . Tobacco comment: quit 20 years  Substance Use Topics  . Alcohol use: No    Alcohol/week: 0.0 oz  . Drug use: No    Review of Systems Per HPI unless specifically indicated above     Objective:    BP  110/68   Pulse (!) 57   Temp 98 F (36.7 C) (Oral)   Resp 16   Ht 5\' 5"  (1.651 m)   Wt 256 lb 12.8 oz (116.5 kg)   LMP 09/13/2016   BMI 42.73 kg/m   Wt Readings from Last 3 Encounters:  01/02/17 256 lb 12.8 oz (116.5 kg)  11/22/16 254 lb (115.2 kg)  11/07/16 255 lb 9.6 oz (115.9 kg)    Physical Exam  Constitutional: She is oriented to person, place, and time. She appears well-developed and well-nourished. No distress.  Well-appearing, comfortable, cooperative, obese  HENT:  Head: Normocephalic and atraumatic.  Mouth/Throat: Oropharynx is clear and moist.  Eyes: Conjunctivae are normal.  Neck: Normal range of motion. Neck supple. No thyromegaly (Seems improved) present.    Cardiovascular: Normal rate, regular rhythm, normal heart sounds and intact distal pulses.  No murmur heard. Pulmonary/Chest: Effort normal and breath sounds normal. No respiratory distress. She has no wheezes. She has no rales.  Musculoskeletal: She exhibits no edema.  Lymphadenopathy:    She has no cervical adenopathy.  Neurological: She is alert and oriented to person, place, and time.  Skin: Skin is warm and dry. No rash noted. She is not diaphoretic. No erythema.  Psychiatric: She has a normal mood and affect. Her behavior is normal.  Well groomed, good eye contact, normal speech and thoughts. No longer anxious appearing. Good mood and positive today.  Nursing note and vitals reviewed.    Results for orders placed or performed in visit on 12/27/16  VITAMIN D 25 Hydroxy (Vit-D Deficiency, Fractures)  Result Value Ref Range   Vit D, 25-Hydroxy 43 30 - 100 ng/mL  T4, free  Result Value Ref Range   Free T4 1.1 0.8 - 1.8 ng/dL  TSH  Result Value Ref Range   TSH 2.47 mIU/L      Assessment & Plan:   Problem List Items Addressed This Visit    Acquired hypothyroidism    Stable well controlled TSH / Free T4. Recent diagnosis within past few months Improved but still seems multifactorial issues though  Plan: 1. Continue levothyroxine 39mcg daily 2. Now wait to check labs for up to 3-6 months since stable on multiple checks      Anxiety and depression    Notable improvement anxiety and persistent depression, now with better sleep improved insomnia, supplements, corrected VIt D and Hypothyroidism - No failed meds. No psychiatry / therapy - Chronic mood problem s/p MI 03/2014  Plan: 1. Continue Escitalopram from 20mg  daily 2. Declines therapist or counseling - will reconsider if need 3. Follow-up 3 months      Chronic fatigue - Primary    Improved overall Still multifactorial, seems improved on levothyroxine, Vitamin D correction, and vitamin supplements and better sleep.  Also better mood. Continue current meds Follow-up      Morbid obesity with BMI of 40.0-44.9, adult (HCC)    Still gradual chronic weight gain recently, overall past 2 years following MI and depression - Not PreDM - Now controlled Hypothyroidism, still some wt gain  Plan: 1. Counseling on lifestyle modifications again encouraged to continue improve diet, low carb, low sugar, inc water less soda. - Goals to improve regular exercise with walking, inc brisk pace despite BB that limits heart rate, goal up to 4 mph walking 2. Continue levothyroxine 3. Re-consider options if still not improving after continued lifestyle changes - may reconsider contrave vs referral wt management clinic      Vitamin  D deficiency    Improved, back to normal range >40 Reduce dose Vitamin D3 5k down to 2k daily maintenance OTC         No orders of the defined types were placed in this encounter.   Follow up plan: Return in about 3 months (around 04/02/2017) for Fatigue Weight Check / Thyroid / Mood/ PHQ/GAD.  [name], [facility] 01/02/2017, 1:05 PM

## 2017-03-19 ENCOUNTER — Other Ambulatory Visit: Payer: Self-pay | Admitting: Family Medicine

## 2017-03-19 DIAGNOSIS — E039 Hypothyroidism, unspecified: Secondary | ICD-10-CM

## 2017-03-23 ENCOUNTER — Encounter: Payer: Self-pay | Admitting: Family Medicine

## 2017-03-23 DIAGNOSIS — R110 Nausea: Secondary | ICD-10-CM

## 2017-03-23 DIAGNOSIS — N921 Excessive and frequent menstruation with irregular cycle: Secondary | ICD-10-CM

## 2017-03-23 DIAGNOSIS — N200 Calculus of kidney: Secondary | ICD-10-CM

## 2017-03-26 MED ORDER — ONDANSETRON HCL 4 MG PO TABS: 4.0000 mg | ORAL_TABLET | Freq: Four times a day (QID) | ORAL | 0 refills | Status: DC | PRN

## 2017-03-26 MED ORDER — TAMSULOSIN HCL 0.4 MG PO CAPS: 0.4000 mg | ORAL_CAPSULE | Freq: Every day | ORAL | 0 refills | Status: DC

## 2017-04-02 ENCOUNTER — Other Ambulatory Visit: Payer: Self-pay | Admitting: Family Medicine

## 2017-04-02 ENCOUNTER — Ambulatory Visit (INDEPENDENT_AMBULATORY_CARE_PROVIDER_SITE_OTHER): Payer: BLUE CROSS/BLUE SHIELD | Admitting: Family Medicine

## 2017-04-02 ENCOUNTER — Encounter: Payer: Self-pay | Admitting: Family Medicine

## 2017-04-02 VITALS — BP 114/72 | HR 54 | Temp 98.1°F | Resp 16 | Ht 65.0 in | Wt 262.0 lb

## 2017-04-02 DIAGNOSIS — E039 Hypothyroidism, unspecified: Secondary | ICD-10-CM

## 2017-04-02 DIAGNOSIS — E559 Vitamin D deficiency, unspecified: Secondary | ICD-10-CM

## 2017-04-02 DIAGNOSIS — Z6841 Body Mass Index (BMI) 40.0 and over, adult: Secondary | ICD-10-CM

## 2017-04-02 DIAGNOSIS — F329 Major depressive disorder, single episode, unspecified: Secondary | ICD-10-CM

## 2017-04-02 DIAGNOSIS — R5382 Chronic fatigue, unspecified: Secondary | ICD-10-CM

## 2017-04-02 DIAGNOSIS — Z0000 Encounter for general adult medical examination without abnormal findings: Secondary | ICD-10-CM

## 2017-04-02 DIAGNOSIS — F419 Anxiety disorder, unspecified: Secondary | ICD-10-CM

## 2017-04-02 DIAGNOSIS — I10 Essential (primary) hypertension: Secondary | ICD-10-CM

## 2017-04-02 DIAGNOSIS — F32A Depression, unspecified: Secondary | ICD-10-CM

## 2017-04-02 DIAGNOSIS — I2510 Atherosclerotic heart disease of native coronary artery without angina pectoris: Secondary | ICD-10-CM

## 2017-04-02 DIAGNOSIS — E785 Hyperlipidemia, unspecified: Secondary | ICD-10-CM

## 2017-04-02 NOTE — Assessment & Plan Note (Signed)
Still gradual chronic weight gain, overall past 2 years following MI and depression and also with new hypothyroidism and recent hysterectomy GYN surgery 09/2016 Recently not as adherent to lifestyle regimen, goals to improve  Plan: 1. Counseling on lifestyle modifications again encouraged to continue improve diet, low carb, low sugar, inc water less soda. - Goals to improve regular exercise with walking, inc brisk pace despite BB that limits heart rate, goal up to 4 mph walking 2. Continue levothyroxine 3. Proceed with referral to [facility] Dr Leafy Ro for further eval and management of wt loss

## 2017-04-02 NOTE — Patient Instructions (Addendum)
Thank you for coming to the office today.  1.  Referral sent for weight management - stay tuned for apt and more information  WEIGHT MANAGEMENT  Dr Dennard Nip  [facility] [address] [place], [place] [address] Ph: [phone]  --------------------  Continue with current medicines  If Weight Management office checks blood work - you can call our office or send message to check to see if still need blood in 6 months   DUE for FASTING BLOOD WORK (no food or drink after midnight before the lab appointment, only water or coffee without cream/sugar on the morning of)  SCHEDULE "Lab Only" visit in the morning at the clinic for lab draw in 6 MONTHS   - Make sure Lab Only appointment is at about 1 week before your next appointment, so that results will be available  For Lab Results, once available within 2-3 days of blood draw, you can can log in to MyChart online to view your results and a brief explanation. Also, we can discuss results at next follow-up visit.   Please schedule a Follow-up Appointment to: Return in about 6 months (around 10/03/2017) for Annual Physical.    If you have any other questions or concerns, please feel free to call the office or send a message through [place]. You may also schedule an earlier appointment if necessary.  Additionally, you may be receiving a survey about your experience at our office within a few days to 1 week by e-mail or mail. We value your feedback.  [name], DO [place]

## 2017-04-02 NOTE — Assessment & Plan Note (Signed)
Well controlled, seems in remission or no active episode at this time Improved sleep Controlled on SSRI Lexapro, controlled hypothyroidism - Chronic mood problem s/p MI 03/2014  Plan: 1. Continue Escitalopram from 20mg  daily 2. Declines therapist or counseling - will reconsider if need 3. Follow-up 6 months

## 2017-04-02 NOTE — Assessment & Plan Note (Signed)
Still well controlled clinically, energy improved, still problem w/ wt gain Recent diagnosis within past 6- 12 months Last TSH / Free T4 normal 11/2016, x 2 normal readings  Plan: 1. Continue levothyroxine 30mcg daily 2. Wait for lab re-check TSH / Free T4 09/2017 with annual in 6 mo

## 2017-04-02 NOTE — Progress Notes (Signed)
Subjective:    Patient ID: Sheri [name], female    DOB: 03/09/1965, 52 y.o.   MRN: [id number]  Sheri [name] is a 52 y.o. female presenting on 04/02/2017 for Thyroid Problem and Fatigue   HPI   FOLLOW-UP Morbid Obesity BMI >43, HYPOTHYROIDISM / Chronic Fatigue / Depression / Anxiety - Last visit with me 01/02/17, forsame problems, has done better on high dose lexapro 10 to 20 and relatively recent dx hypothyroidism recently stable on levothyroxine 39mcg daily, last TSH labs were done in 11/2016, see prior notes for background information. - Today patient reportsshe still feels good overall, still has more energy, feels less "foggy" and doing well on current meds. No more fatigue - Her only concern is the weight gain, still has abnormal weight gain up 6-8 lbs in past 3 months - Other related history, she had hysterectomy in 09/2016, she was unaware if this would cause some weight gain - Taking Vitamin D3 2,000 iu daily (recently lowered) since Vit D was controlled - Taking other OTC supplement chromium, ginkgo biloba, and melatonin low dose nightly for sleep, with notable improvement in sleep - Taking Lexapro 20mg  daily - Taking Levothyroxine 20mcg daily - Lifestyle: - See wt gain below - has not called ins to check cost/coverage on wt loss med, she is open to referral now for further wt management - Diet Still improved diet overall, reduced portions, healthier options - Exercise: Recently has not been adhering to activity and walking, due to recent health issues with husband tongue cancer require surgery and helping him recover - Denies any chest pain, dyspnea, headache, near syncope, vision changes, cough, irritability or anger, agitation, depression or anxiety   Additional history - History of Nephrolithiasis - recent contact with sent mychart message on 03/23/17, stating she had some symptoms of stone, and she was given trial of Flomax and Zofran, she did admit now to passing a small  stone and symptoms have resolved, no other complication, has finished these meds  Health Maintenance: Due for Mammogram according to our chart, however she states she had Mammogram ordered / scheduled per [facility] GYN followed by Sheri [name]  Depression screen [facility] 2/9 04/02/2017 01/02/2017 11/07/2016  Decreased Interest 0 0 0  Down, Depressed, Hopeless 0 1 1  PHQ - 2 Score 0 1 1  Altered sleeping 0 1 3  Tired, decreased energy 2 1 3   Change in appetite 2 0 0  Feeling bad or failure about yourself  0 0 0  Trouble concentrating 0 0 0  Moving slowly or fidgety/restless 0 0 0  Suicidal thoughts 0 0 0  PHQ-9 Score 4 3 7   Difficult doing work/chores Not difficult at all Not difficult at all Not difficult at all   GAD 7 : Generalized Anxiety Score 01/02/2017 11/07/2016 06/19/2016 06/21/2015  Nervous, Anxious, on Edge 1 3 3 2   Control/stop worrying 1 3 0 3  Worry too much - different things 1 3 0 3  Trouble relaxing 1 3 1 3   Restless 0 0 0 2  Easily annoyed or irritable 0 0 0 3  Afraid - awful might happen 1 3 0 3  Total GAD 7 Score 5 15 4 19   Anxiety Difficulty Not difficult at all Not difficult at all Not difficult at all Very difficult     Social History   Tobacco Use  . Smoking status: Former Smoker    Packs/day: 1.00    Types: Cigarettes  . Smokeless tobacco:  Never Used  . Tobacco comment: quit 20 years  Substance Use Topics  . Alcohol use: No    Alcohol/week: 0.0 oz  . Drug use: No    Review of Systems Per HPI unless specifically indicated above     Objective:    BP 114/72   Pulse (!) 54   Temp 98.1 F (36.7 C) (Oral)   Resp 16   Ht 5\' 5"  (1.651 m)   Wt 262 lb (118.8 kg)   LMP 09/13/2016   BMI 43.60 kg/m   Wt Readings from Last 3 Encounters:  04/02/17 262 lb (118.8 kg)  01/02/17 256 lb 12.8 oz (116.5 kg)  11/22/16 254 lb (115.2 kg)    Physical Exam  Constitutional: She is oriented to person, place, and time. She appears well-developed and well-nourished. No  distress.  Well-appearing, comfortable, cooperative, obese with some weight gain  HENT:  Head: Normocephalic and atraumatic.  Mouth/Throat: Oropharynx is clear and moist.  Eyes: Conjunctivae are normal.  Neck: Normal range of motion. Neck supple. No thyromegaly (Seems improved still without appreciable thyromegaly) present.  Cardiovascular: Normal rate, regular rhythm, normal heart sounds and intact distal pulses.  No murmur heard. Pulmonary/Chest: Effort normal and breath sounds normal. No respiratory distress. She has no wheezes. She has no rales.  Musculoskeletal: She exhibits no edema.  Lymphadenopathy:    She has no cervical adenopathy.  Neurological: She is alert and oriented to person, place, and time.  Skin: Skin is warm and dry. No rash noted. She is not diaphoretic. No erythema.  Psychiatric: She has a normal mood and affect. Her behavior is normal.  Well groomed, good eye contact, normal speech and thoughts. No longer anxious appearing. Good mood and positive today.  Nursing note and vitals reviewed.  Results for orders placed or performed in visit on 12/27/16  VITAMIN D 25 Hydroxy (Vit-D Deficiency, Fractures)  Result Value Ref Range   Vit D, 25-Hydroxy 43 30 - 100 ng/mL  T4, free  Result Value Ref Range   Free T4 1.1 0.8 - 1.8 ng/dL  TSH  Result Value Ref Range   TSH 2.47 mIU/L      Assessment & Plan:   Problem List Items Addressed This Visit    Acquired hypothyroidism    Still well controlled clinically, energy improved, still problem w/ wt gain Recent diagnosis within past 6- 12 months Last TSH / Free T4 normal 11/2016, x 2 normal readings  Plan: 1. Continue levothyroxine 47mcg daily 2. Wait for lab re-check TSH / Free T4 09/2017 with annual in 6 mo      Anxiety and depression    Well controlled, seems in remission or no active episode at this time Improved sleep Controlled on SSRI Lexapro, controlled hypothyroidism - Chronic mood problem s/p MI  03/2014  Plan: 1. Continue Escitalopram from 20mg  daily 2. Declines therapist or counseling - will reconsider if need 3. Follow-up 6 months      Morbid obesity with BMI of 40.0-44.9, adult ([place]) - Primary    Still gradual chronic weight gain, overall past 2 years following MI and depression and also with new hypothyroidism and recent hysterectomy GYN surgery 09/2016 Recently not as adherent to lifestyle regimen, goals to improve  Plan: 1. Counseling on lifestyle modifications again encouraged to continue improve diet, low carb, low sugar, inc water less soda. - Goals to improve regular exercise with walking, inc brisk pace despite BB that limits heart rate, goal up to 4 mph walking 2.  Continue levothyroxine 3. Proceed with referral to [facility] Sheri Leafy Ro for further eval and management of wt loss      Relevant Orders   Amb Ref to Medical Weight Management   Vitamin D deficiency    Previously improved Continue Vitamin D3 2,000 iu daily for now Re-check in 6 months         No orders of the defined types were placed in this encounter.  Orders Placed This Encounter  Procedures  . Amb Ref to Medical Weight Management    Referral Priority:   Routine    Referral Type:   Consultation    Referred to Provider:   Starlyn Skeans, MD    Number of Visits Requested:   1     Follow up plan: Return in about 6 months (around 10/03/2017) for Annual Physical.  Future labs ordered for 10/04/17.  [name], [facility] 04/02/2017, 12:47 PM

## 2017-04-02 NOTE — Assessment & Plan Note (Signed)
Previously improved Continue Vitamin D3 2,000 iu daily for now Re-check in 6 months

## 2017-04-09 ENCOUNTER — Ambulatory Visit: Payer: BLUE CROSS/BLUE SHIELD | Admitting: Family Medicine

## 2017-04-09 ENCOUNTER — Encounter: Payer: Self-pay | Admitting: Family Medicine

## 2017-04-09 VITALS — BP 108/75 | HR 72 | Temp 98.3°F | Resp 16 | Ht 65.0 in | Wt 259.0 lb

## 2017-04-09 DIAGNOSIS — J209 Acute bronchitis, unspecified: Secondary | ICD-10-CM | POA: Diagnosis not present

## 2017-04-09 MED ORDER — AZITHROMYCIN 250 MG PO TABS: ORAL_TABLET | ORAL | 0 refills | Status: DC

## 2017-04-09 MED ORDER — GUAIFENESIN-CODEINE 100-10 MG/5ML PO SYRP: 5.0000 mL | ORAL_SOLUTION | Freq: Three times a day (TID) | ORAL | 0 refills | Status: DC | PRN

## 2017-04-09 MED ORDER — BENZONATATE 100 MG PO CAPS: 100.0000 mg | ORAL_CAPSULE | Freq: Three times a day (TID) | ORAL | 0 refills | Status: DC | PRN

## 2017-04-09 NOTE — Progress Notes (Signed)
Subjective:    Patient ID: Sheri [name], female    DOB: 1966/01/01, 52 y.o.   MRN: [id number]  Sheri [name] is a 52 y.o. female presenting on 04/09/2017 for Laryngitis (onset 10 days SOB today HA from coughing)  Patient presents for a same day appointment.  HPI  BRONCHITIS / COUGH Reports recent URI viral symptoms, last visit with me 04/02/17 for office visit for weight and obesity, not related sick symptoms. She started to have fever chills and coughing and congestion, she had her husband help with "breaking up congestion", her symptoms of fever and chills had resolved. Now more coughing symptoms, and had a mild hoarse voice with some improvement. - Took Robitussin and Ibuprofen, Tylenol. NyQuil Cold & Flu - Taking Ginger/Tea Honey etc and home remedy - Admits non productive cough, hoarse voice, mild nausea - Denies fevers, chills sweats, wheezing, vomiting, diarrhea, abdominal pain, muscle aches  Depression screen [facility] 2/9 04/02/2017 01/02/2017 11/07/2016  Decreased Interest 0 0 0  Down, Depressed, Hopeless 0 1 1  PHQ - 2 Score 0 1 1  Altered sleeping 0 1 3  Tired, decreased energy 2 1 3   Change in appetite 2 0 0  Feeling bad or failure about yourself  0 0 0  Trouble concentrating 0 0 0  Moving slowly or fidgety/restless 0 0 0  Suicidal thoughts 0 0 0  PHQ-9 Score 4 3 7   Difficult doing work/chores Not difficult at all Not difficult at all Not difficult at all    Social History   Tobacco Use  . Smoking status: Former Smoker    Packs/day: 1.00    Types: Cigarettes  . Smokeless tobacco: Never Used  . Tobacco comment: quit 20 years  Substance Use Topics  . Alcohol use: No    Alcohol/week: 0.0 oz  . Drug use: No    Review of Systems Per HPI unless specifically indicated above     Objective:    BP 108/75   Pulse 72   Temp 98.3 F (36.8 C) (Oral)   Resp 16   Ht 5\' 5"  (1.651 m)   Wt 259 lb (117.5 kg)   LMP 09/13/2016   SpO2 97%   BMI 43.10 kg/m   Wt Readings from  Last 3 Encounters:  04/09/17 259 lb (117.5 kg)  04/02/17 262 lb (118.8 kg)  01/02/17 256 lb 12.8 oz (116.5 kg)    Physical Exam  Constitutional: She is oriented to person, place, and time. She appears well-developed and well-nourished. No distress.  Mildly tired and ill appearing, comfortable except coughing, cooperative  HENT:  Head: Normocephalic and atraumatic.  Mouth/Throat: Oropharynx is clear and moist.  Frontal / maxillary sinuses non-tender. Nares patent without purulence or edema. Bilateral TMs clear without erythema, effusion or bulging. Oropharynx clear without erythema, exudates, edema or asymmetry.  Mild hoarse voice  Eyes: Conjunctivae are normal. Right eye exhibits no discharge. Left eye exhibits no discharge.  Neck: Normal range of motion. Neck supple.  Cardiovascular: Normal rate, regular rhythm, normal heart sounds and intact distal pulses.  No murmur heard. Pulmonary/Chest: Effort normal. No respiratory distress.  Mild coarse breath sounds with some clear on cough, no focal wheezing or crackles, some reduced air movement  Musculoskeletal: She exhibits no edema.  Lymphadenopathy:    She has no cervical adenopathy.  Neurological: She is alert and oriented to person, place, and time.  Skin: Skin is warm and dry. No rash noted. She is not diaphoretic. No erythema.  Psychiatric: Her behavior is normal.  Nursing note and vitals reviewed.  Results for orders placed or performed in visit on 12/27/16  VITAMIN D 25 Hydroxy (Vit-D Deficiency, Fractures)  Result Value Ref Range   Vit D, 25-Hydroxy 43 30 - 100 ng/mL  T4, free  Result Value Ref Range   Free T4 1.1 0.8 - 1.8 ng/dL  TSH  Result Value Ref Range   TSH 2.47 mIU/L      Assessment & Plan:   Problem List Items Addressed This Visit    None    Visit Diagnoses    Acute bronchitis, unspecified organism    -  Primary   Relevant Medications   guaiFENesin-codeine (ROBITUSSIN AC) 100-10 MG/5ML syrup    azithromycin (ZITHROMAX Z-PAK) 250 MG tablet   benzonatate (TESSALON) 100 MG capsule      Consistent with worsening bronchitis in setting of likely viral URI vs possible flu-like illness, was not tested or treated > 1 week ago, possibly has post flu complication.  Concern with duration >1 week - Afebrile, no focal signs of infection (not consistent with pneumonia by history or exam), no evidence sinusitis. Mild exp coarse breath sounds on exam concern for some bronchospasm (without history of COPD, but former smoker), mild reduced air movement.  Plan: 1. Start Azithromycin Z-pak dosing 500mg  then 250mg  daily x 4 days - Start Tessalon Perls take 1 capsule up to 3 times a day as needed for cough - if not improved may try codeine cough syrup as prescribed, reviewed risks and precautions - Recommend trial OTC - Mucinex, Tylenol/Ibuprofen PRN, Nasal saline, lozenges, tea with honey/lemon Return criteria reviewed, follow-up within 1 week if not improved   Meds ordered this encounter  Medications  . guaiFENesin-codeine (ROBITUSSIN AC) 100-10 MG/5ML syrup    Sig: Take 5-10 mLs by mouth 3 (three) times daily as needed for cough.    Dispense:  120 mL    Refill:  0  . azithromycin (ZITHROMAX Z-PAK) 250 MG tablet    Sig: Take 2 tabs (500mg  total) on Day 1. Take 1 tab (250mg ) daily for next 4 days.    Dispense:  6 tablet    Refill:  0  . benzonatate (TESSALON) 100 MG capsule    Sig: Take 1 capsule (100 mg total) by mouth 3 (three) times daily as needed for cough.    Dispense:  30 capsule    Refill:  0      Follow up plan: Return in about 1 week (around 04/16/2017), or if symptoms worsen or fail to improve, for bronchitis.  [name], [facility] 04/09/2017, 10:28 PM

## 2017-04-09 NOTE — Patient Instructions (Addendum)
Thank you for coming to the office today.  1. It sounds like you had an Upper Respiratory Virus that has settled into a Bronchitis, lower respiratory tract infection. I don't have concerns for pneumonia today, and think that this should gradually improve. Once you are feeling better, the cough may take a few weeks to fully resolve.   Start Azithromycin Z pak (antibiotic) 2 tabs day 1, then 1 tab x 4 days, complete entire course even if improved  Cough syrup sent to pharmacy  Start Tessalon Perls take 1 capsule up to 3 times a day as needed for cough  - Start OTC Mucinex for 1 week or less, to help clear the mucus - Use nasal saline (Simply Saline or Ocean Spray) to flush nasal congestion multiple times a day, may help cough - Drink plenty of fluids to improve congestion  If your symptoms seem to worsen instead of improve over next several days, including significant fever / chills, worsening shortness of breath, worsening wheezing, or nausea / vomiting and can't take medicines - return sooner or go to hospital Emergency Department for more immediate treatment.   Please schedule a Follow-up Appointment to: Return in about 1 week (around 04/16/2017), or if symptoms worsen or fail to improve, for bronchitis.  If you have any other questions or concerns, please feel free to call the office or send a message through [place]. You may also schedule an earlier appointment if necessary.  Additionally, you may be receiving a survey about your experience at our office within a few days to 1 week by e-mail or mail. We value your feedback.  [name], DO [place]

## 2017-04-22 ENCOUNTER — Other Ambulatory Visit: Payer: Self-pay | Admitting: Interventional Cardiology

## 2017-04-22 ENCOUNTER — Other Ambulatory Visit: Payer: Self-pay | Admitting: Family Medicine

## 2017-04-22 DIAGNOSIS — N200 Calculus of kidney: Secondary | ICD-10-CM

## 2017-05-03 ENCOUNTER — Encounter (INDEPENDENT_AMBULATORY_CARE_PROVIDER_SITE_OTHER): Payer: BLUE CROSS/BLUE SHIELD

## 2017-05-14 ENCOUNTER — Encounter (INDEPENDENT_AMBULATORY_CARE_PROVIDER_SITE_OTHER): Payer: Self-pay | Admitting: Family Medicine

## 2017-05-14 ENCOUNTER — Ambulatory Visit (INDEPENDENT_AMBULATORY_CARE_PROVIDER_SITE_OTHER): Payer: BLUE CROSS/BLUE SHIELD | Admitting: Family Medicine

## 2017-05-14 VITALS — BP 121/81 | HR 62 | Temp 97.4°F | Ht 65.0 in | Wt 257.0 lb

## 2017-05-14 DIAGNOSIS — Z9189 Other specified personal risk factors, not elsewhere classified: Secondary | ICD-10-CM

## 2017-05-14 DIAGNOSIS — E559 Vitamin D deficiency, unspecified: Secondary | ICD-10-CM

## 2017-05-14 DIAGNOSIS — R5383 Other fatigue: Secondary | ICD-10-CM | POA: Diagnosis not present

## 2017-05-14 DIAGNOSIS — Z1331 Encounter for screening for depression: Secondary | ICD-10-CM

## 2017-05-14 DIAGNOSIS — E038 Other specified hypothyroidism: Secondary | ICD-10-CM

## 2017-05-14 DIAGNOSIS — Z0289 Encounter for other administrative examinations: Secondary | ICD-10-CM

## 2017-05-14 DIAGNOSIS — Z6841 Body Mass Index (BMI) 40.0 and over, adult: Secondary | ICD-10-CM

## 2017-05-14 DIAGNOSIS — R0602 Shortness of breath: Secondary | ICD-10-CM | POA: Diagnosis not present

## 2017-05-14 NOTE — Progress Notes (Signed)
Office: [phone]  /  Fax: [phone]   Dear Dr. Parks Ranger,   Thank you for referring Sheri [name] to our clinic. The following note includes my evaluation and treatment recommendations.  HPI:   Chief Complaint: OBESITY    Sheri [name] has been referred by Olin Hauser, DO for consultation regarding her obesity and obesity related comorbidities.    Sheri [name] (MR# [id number]) is a 52 y.o. female who presents on 05/14/2017 for obesity evaluation and treatment. Current BMI is Body mass index is 42.77 kg/m.Marland Kitchen Sheri [name] has been struggling with her weight for many years and has been unsuccessful in either losing weight, maintaining weight loss, or reaching her healthy weight goal.     Sheri [name] attended our information session and states she is currently in the action stage of change and ready to dedicate time achieving and maintaining a healthier weight. Sheri [name] is interested in becoming our patient and working on intensive lifestyle modifications including (but not limited to) diet, exercise and weight loss.    Sheri [name] states her family eats meals together she struggles with family and or coworkers weight loss sabotage her desired weight loss is 122 lbs she started gaining weight after heart attack her heaviest weight ever was 262 lbs. she has significant food cravings issues  she snacks frequently in the evenings she wakes up frequently in the middle of the night to eat she skips meals frequently she is frequently drinking liquids with calories she has binge eating behaviors she struggles with emotional eating    Sheri [name] feels her energy is lower than it should be. This has worsened with weight gain and has not worsened recently. Sheri [name] denies daytime somnolence and admits to waking up still tired. Patient is at risk for obstructive sleep apnea. Patent has a history of symptoms of morning Sheri. Patient generally gets 8 or more hours of sleep per night, and  states they generally have restless sleep.. Snoring is present. Apneic episodes are present. Epworth Sleepiness Score is 0  EKG was ordered today and shows old infarction, otherwise within normal limits.  Dyspnea on exertion Sheri [name] notes increasing shortness of breath with exercising and seems to be worsening over time with weight gain. She notes getting out of breath sooner with activity than she used to. This has not gotten worse recently. EKG was ordered today and shows old infarction, otherwise within normal limits. Sheri [name] denies orthopnea.  Vitamin D deficiency Sheri [name] has a diagnosis of vitamin D deficiency. She is currently taking OTC vit D 2,000 IU daily and denies nausea, vomiting or muscle weakness.  At risk for osteopenia and osteoporosis Sheri [name] is at higher risk of osteopenia and osteoporosis due to vitamin D deficiency.   Hypothyroidism Sheri [name] has a diagnosis of hypothyroidism. She is on levothyroxine 25 mcg daily. She denies hot or cold intolerance or palpitations, but does admit to ongoing Sheri.  Depression Screen Sheri [name]'s Food and Mood (modified PHQ-9) score was  Depression screen PHQ 2/9 05/14/2017  Decreased Interest 3  Down, Depressed, Hopeless 1  PHQ - 2 Score 4  Altered sleeping 1  Tired, decreased energy 3  Change in appetite 2  Feeling bad or failure about yourself  3  Trouble concentrating 3  Moving slowly or fidgety/restless 1  Suicidal thoughts 0  PHQ-9 Score 17  Difficult doing work/chores Extremely dIfficult  Some recent data might be hidden    ALLERGIES: Allergies  Allergen Reactions  . Latex Dermatitis  . Penicillins Rash  Has patient had a PCN reaction causing immediate rash, facial/tongue/throat swelling, SOB or lightheadedness with hypotension: Yes Has patient had a PCN reaction causing severe rash involving mucus membranes or skin necrosis: Unknown Has patient had a PCN reaction that required hospitalization: No Has patient had a PCN  reaction occurring within the last 10 years: No If all of the above answers are "NO", then may proceed with Cephalosporin use.     MEDICATIONS: Current Outpatient Medications on File Prior to Visit  Medication Sig Dispense Refill  . clopidogrel (PLAVIX) 75 MG tablet TAKE 1 TABLET (75 MG TOTAL) BY MOUTH DAILY. 90 tablet 2  . escitalopram (LEXAPRO) 20 MG tablet Take 1 tablet (20 mg total) by mouth daily. 30 tablet 11  . isosorbide mononitrate (IMDUR) 30 MG 24 hr tablet TAKE 1 TABLET BY MOUTH DAILY 90 tablet 2  . levothyroxine (SYNTHROID, LEVOTHROID) 25 MCG tablet TAKE 1 TABLET (25 MCG TOTAL) BY MOUTH DAILY BEFORE BREAKFAST. 30 tablet 5  . lisinopril (PRINIVIL,ZESTRIL) 5 MG tablet TAKE 1 TABLET (5 MG TOTAL) BY MOUTH DAILY. 90 tablet 2  . Melatonin 1 MG CAPS Take by mouth.  0  . metoprolol tartrate (LOPRESSOR) 25 MG tablet TAKE 1 TABLET TWICE A DAY 180 tablet 2  . nitroGLYCERIN (NITROSTAT) 0.4 MG SL tablet Place 1 tablet (0.4 mg total) under the tongue every 5 (five) minutes x 3 doses as needed for chest pain. 25 tablet 2  . ondansetron (ZOFRAN) 4 MG tablet Take 4 mg by mouth every 8 (eight) hours as needed for nausea or vomiting.    . rosuvastatin (CRESTOR) 20 MG tablet TAKE 1 TABLET DAILY 90 tablet 1  . tamsulosin (FLOMAX) 0.4 MG CAPS capsule TAKE 1 CAPSULE BY MOUTH EVERY DAY 30 capsule 0  . Vitamin D, Cholecalciferol, 1000 units CAPS Take 2,000 Units by mouth daily.    . Chromium 200 MCG CAPS Take by mouth. 30 each 0   No current facility-administered medications on file prior to visit.     PAST MEDICAL HISTORY: Past Medical History:  Diagnosis Date  . Abnormal uterine bleeding   . Anomaly heart   . Anxiety   . Coronary artery disease   . Depression   . Dyspnea    with exertion  . Dysrhythmia   . Heart attack ([place]) 03/2014  . Heart disease   . History of C-section   . History of kidney stones   . HLD (hyperlipidemia)   . Hx of heart artery stent   . Hypertension   .  Hypothyroidism     PAST SURGICAL HISTORY: Past Surgical History:  Procedure Laterality Date  . CARDIAC CATHETERIZATION    . CARDIAC SURGERY    . [place]  . CHOLECYSTECTOMY    . CORONARY ANGIOPLASTY    . CYSTOSCOPY  10/10/2016   Procedure: CYSTOSCOPY;  Surgeon: Will Bonnet, MD;  Location: [facility] ORS;  Service: Gynecology;;  . Consuela Mimes WITH STENT PLACEMENT Left 11/01/2015   Procedure: CYSTOSCOPY WITH STENT PLACEMENT;  Surgeon: Hollice Espy, MD;  Location: [facility] ORS;  Service: Urology;  Laterality: Left;  . CYSTOSCOPY/RETROGRADE/URETEROSCOPY Bilateral 11/01/2015   Procedure: CYSTOSCOPY/RETROGRADE/URETEROSCOPY;  Surgeon: Hollice Espy, MD;  Location: [facility] ORS;  Service: Urology;  Laterality: Bilateral;  . DILATION AND CURETTAGE OF UTERUS  2011  . ENDOMETRIAL ABLATION  2011  . LAPAROSCOPIC HYSTERECTOMY Bilateral 10/10/2016   Procedure: HYSTERECTOMY TOTAL LAPAROSCOPIC BILATERAL SALPINGECTOMY;  Surgeon: Will Bonnet, MD;  Location: [facility] OR[facility];  Service: Gynecology;  Laterality: Bilateral;  . LEFT HEART CATHETERIZATION WITH CORONARY ANGIOGRAM N/A 04/06/2014   Procedure: LEFT HEART CATHETERIZATION WITH CORONARY ANGIOGRAM;  Surgeon: Jettie Booze, MD;  Location: [facility];  Service: Cardiovascular;  Laterality: N/A;    SOCIAL HISTORY: Social History   Tobacco Use  . Smoking status: Former Smoker    Packs/day: 1.00    Types: Cigarettes  . Smokeless tobacco: Never Used  . Tobacco comment: quit 20 years  Substance Use Topics  . Alcohol use: No    Alcohol/week: 0.0 oz  . Drug use: No    FAMILY HISTORY: Family History  Problem Relation Age of Onset  . Heart attack Father 44       died of MI at age 63  . High blood pressure Father   . High Cholesterol Father   . Heart disease Father   . Sudden death Father   . Hypertension Mother   . Hyperlipidemia Mother   . Hypothyroidism Mother   . Kidney Stones Sister   . Kidney Stones Brother     . Stroke Paternal Grandfather   . Liver cancer Maternal Grandmother   . Kidney disease Neg Hx   . Bladder Cancer Neg Hx     ROS: Review of Systems  Constitutional: Positive for malaise/Sheri.  Eyes:       Wear Glasses or Contacts  Respiratory: Positive for shortness of breath.   Cardiovascular: Negative for palpitations and orthopnea.       Shortness of Breath on exertion   Gastrointestinal: Negative for nausea and vomiting.  Musculoskeletal:       Negative for muscle weakness  Neurological: Positive for weakness.  Endo/Heme/Allergies: Bruises/bleeds easily (bruising).       Negative for heat or cold intolerance  Psychiatric/Behavioral:       Stress    PHYSICAL EXAM: Blood pressure 121/81, pulse 62, temperature (!) 97.4 F (36.3 C), temperature source Oral, height 5\' 5"  (1.651 m), weight 257 lb (116.6 kg), last menstrual period 09/13/2016, SpO2 96 %. Body mass index is 42.77 kg/m. Physical Exam  Constitutional: She is oriented to person, place, and time. She appears well-developed and well-nourished.  HENT:  Head: Normocephalic and atraumatic.  Nose: Nose normal.  Eyes: EOM are normal. No scleral icterus.  Neck: Normal range of motion. Neck supple. No thyromegaly present.  Cardiovascular: Normal rate and regular rhythm.  Pulmonary/Chest: Effort normal. No respiratory distress.  Abdominal: Soft. There is no tenderness.  + obesity  Musculoskeletal: Normal range of motion.  Range of Motion normal in all 4 extremities  Neurological: She is alert and oriented to person, place, and time. Coordination normal.  Skin: Skin is warm and dry.  Psychiatric: She has a normal mood and affect. Her behavior is normal.  Vitals reviewed.   RECENT LABS AND TESTS: BMET    Component Value Date/Time   NA 137 10/11/2016 0521   NA 140 04/11/2016 1616   NA 139 05/08/2014 0550   K 4.1 10/11/2016 0521   K 3.7 05/08/2014 0550   CL 103 10/11/2016 0521   CL 103 05/08/2014 0550   CO2  27 10/11/2016 0521   CO2 30 05/08/2014 0550   GLUCOSE 165 (H) 10/11/2016 0521   GLUCOSE 124 (H) 05/08/2014 0550   BUN 17 10/11/2016 0521   BUN 14 04/11/2016 1616   BUN 14 05/08/2014 0550   CREATININE 0.78 10/11/2016 0521   CREATININE 0.65 05/08/2014 0550   CALCIUM 9.0 10/11/2016 0521   CALCIUM  9.1 05/08/2014 0550   GFRNONAA >60 10/11/2016 0521   GFRNONAA >60 05/08/2014 0550   GFRAA >60 10/11/2016 0521   GFRAA >60 05/08/2014 0550   Lab Results  Component Value Date   HGBA1C 5.3 09/18/2016   No results found for: INSULIN CBC    Component Value Date/Time   WBC 13.8 (H) 10/11/2016 0521   RBC 4.45 10/11/2016 0521   HGB 13.2 10/11/2016 0521   HGB 14.8 04/11/2016 1616   HCT 37.7 10/11/2016 0521   HCT 44.2 04/11/2016 1616   PLT 265 10/11/2016 0521   PLT 291 04/11/2016 1616   MCV 84.8 10/11/2016 0521   MCV 87 04/11/2016 1616   MCV 85 05/07/2014 1018   MCH 29.6 10/11/2016 0521   MCHC 34.9 10/11/2016 0521   RDW 13.0 10/11/2016 0521   RDW 13.6 04/11/2016 1616   RDW 13.6 05/07/2014 1018   LYMPHSABS 2.3 04/11/2016 1616   MONOABS 0.7 12/09/2014 0735   EOSABS 0.1 04/11/2016 1616   BASOSABS 0.0 04/11/2016 1616   Iron/TIBC/Ferritin/ %Sat No results found for: IRON, TIBC, FERRITIN, IRONPCTSAT Lipid Panel     Component Value Date/Time   CHOL 148 09/21/2016 1424   TRIG 120 09/21/2016 1424   HDL 36 (L) 09/21/2016 1424   CHOLHDL 4.1 09/21/2016 1424   CHOLHDL 4 07/27/2014 0943   VLDL 24.8 07/27/2014 0943   LDLCALC 88 09/21/2016 1424   Hepatic Function Panel     Component Value Date/Time   PROT 7.8 09/29/2016 1343   PROT 7.1 09/21/2016 1424   PROT 7.0 05/07/2014 1018   ALBUMIN 4.3 09/29/2016 1343   ALBUMIN 4.3 09/21/2016 1424   ALBUMIN 3.9 05/07/2014 1018   AST 38 09/29/2016 1343   AST 70 (H) 05/07/2014 1018   ALT 42 09/29/2016 1343   ALT 51 05/07/2014 1018   ALKPHOS 87 09/29/2016 1343   ALKPHOS 97 05/07/2014 1018   BILITOT 0.7 09/29/2016 1343   BILITOT 0.3 09/21/2016  1424   BILITOT 0.6 05/07/2014 1018   BILIDIR 0.09 09/21/2016 1424      Component Value Date/Time   TSH 2.47 12/27/2016 0846   TSH 2.92 10/31/2016 0847   TSH 5.77 (H) 09/18/2016 0800   Results for Garrow, Armanii L "LYNN" (MRN [id number]) as of 05/14/2017 17:58  Ref. Range 12/27/2016 08:46  Vitamin D, 25-Hydroxy Latest Ref Range: 30 - 100 ng/mL 43   ECG  shows NSR with a rate of 80 BPM INDIRECT CALORIMETER done today shows a VO2 of 188 and a REE of 1309.  Her calculated basal metabolic rate is 1638 thus her basal metabolic rate is worse than expected.    ASSESSMENT AND PLAN: Other Sheri - Plan: EKG 12-Lead, Comprehensive metabolic panel, CBC With Differential, Hemoglobin A1c, Insulin, random, Lipid Panel With LDL/HDL Ratio, VITAMIN D 25 Hydroxy (Vit-D Deficiency, Fractures), Vitamin B12, Folate  Shortness of breath on exertion  Vitamin D deficiency  Other specified hypothyroidism - Plan: T3, T4, free, TSH  Depression screening  At risk for osteoporosis  Class 3 severe obesity with serious comorbidity and body mass index (BMI) of 40.0 to 44.9 in adult, unspecified obesity type (HCC)  PLAN: Sheri Rabecca was informed that her Sheri may be related to obesity, depression or many other causes. Labs will be ordered, and in the meanwhile Hiral has agreed to work on diet, exercise and weight loss to help with Sheri. Proper sleep hygiene was discussed including the need for 7-8 hours of quality sleep each night. A sleep study was  not ordered based on symptoms and Epworth score. We will order indirect calorimetry.  Dyspnea on exertion Kindred's shortness of breath appears to be obesity related and exercise induced. She has agreed to work on weight loss and gradually increase exercise to treat her exercise induced shortness of breath. If Rickesha follows our instructions and loses weight without improvement of her shortness of breath, we will plan to refer to pulmonology. We will order  labs and indirect calorimetry. We will monitor this condition regularly. [place] agrees to this plan.  Vitamin D Deficiency Zainah was informed that low vitamin D levels contributes to Sheri and are associated with obesity, breast, and colon cancer. She agrees to continue to take OTC Vit D 2,000 IU daily and we will check vitamin D level today. Jacklynn will follow up for routine testing of vitamin D, at least 2-3 times per year. She was informed of the risk of over-replacement of vitamin D and agrees to not increase her dose unless she discusses this with [place] first.  At risk for osteopenia and osteoporosis Shauntelle is at risk for osteopenia and osteoporosis due to her vitamin D deficiency. She was encouraged to take her vitamin D and follow her higher calcium diet and increase strengthening exercise to help strengthen her bones and decrease her risk of osteopenia and osteoporosis.  Hypothyroidism Zeyna was informed of the importance of good thyroid control to help with weight loss efforts. She was also informed that supertheraputic thyroid levels are dangerous and will not improve weight loss results. We will check thyroid panel and Parisa agrees to follow up as directed.  Depression Screen Meliana had a strongly positive depression screening. Depression is commonly associated with obesity and often results in emotional eating behaviors. We will monitor this closely and work on CBT to help improve the non-hunger eating patterns. Referral to Psychology may be required if no improvement is seen as she continues in our clinic.  Obesity Taeler is currently in the action stage of change and her goal is to continue with weight loss efforts. I recommend Enslie begin the structured treatment plan as follows:  She has agreed to follow the Category 2 plan Kenslei has been instructed to eventually work up to a goal of 150 minutes of combined cardio and strengthening exercise per week for weight loss and overall  health benefits. We discussed the following Behavioral Modification Strategies today: no skipping meals, planning for success, increasing lean protein intake, increasing vegetables, work on meal planning and easy cooking plans and decrease liquid calories   She was informed of the importance of frequent follow up visits to maximize her success with intensive lifestyle modifications for her multiple health conditions. She was informed we would discuss her lab results at her next visit unless there is a critical issue that needs to be addressed sooner. Kenyana agreed to keep her next visit at the agreed upon time to discuss these results.    OBESITY BEHAVIORAL INTERVENTION VISIT  Today's visit was # 1 out of 22.  Starting weight: 257 lbs Starting date: 05/14/17 Today's weight : 257 lbs Today's date: 05/14/2017 Total lbs lost to date: 0 (Patients must lose 7 lbs in the first 6 months to continue with counseling)   ASK: We discussed the diagnosis of obesity with Sheri [name] today and Estill Bamberg agreed to give [place] permission to discuss obesity behavioral modification therapy today.  ASSESS: Suhaylah has the diagnosis of obesity and her BMI today is 42.77 Calianne is in the action  stage of change   ADVISE: Waleska was educated on the multiple health risks of obesity as well as the benefit of weight loss to improve her health. She was advised of the need for long term treatment and the importance of lifestyle modifications.  AGREE: Multiple dietary modification options and treatment options were discussed and  Amzie agreed to the above obesity treatment plan.   I, Doreene Nest, am acting as transcriptionist for  Eber Jones, MD   I have reviewed the above documentation for accuracy and completeness, and I agree with the above. - Ilene Qua, MD

## 2017-05-15 ENCOUNTER — Encounter (INDEPENDENT_AMBULATORY_CARE_PROVIDER_SITE_OTHER): Payer: Self-pay | Admitting: Family Medicine

## 2017-05-15 LAB — COMPREHENSIVE METABOLIC PANEL
ALBUMIN: 4.7 g/dL (ref 3.5–5.5)
ALT: 47 IU/L — AB (ref 0–32)
AST: 43 IU/L — ABNORMAL HIGH (ref 0–40)
Albumin/Globulin Ratio: 1.7 (ref 1.2–2.2)
Alkaline Phosphatase: 102 IU/L (ref 39–117)
BILIRUBIN TOTAL: 0.4 mg/dL (ref 0.0–1.2)
BUN / CREAT RATIO: 20 (ref 9–23)
BUN: 14 mg/dL (ref 6–24)
CALCIUM: 9.5 mg/dL (ref 8.7–10.2)
CHLORIDE: 102 mmol/L (ref 96–106)
CO2: 21 mmol/L (ref 20–29)
CREATININE: 0.71 mg/dL (ref 0.57–1.00)
GFR, EST AFRICAN AMERICAN: 113 mL/min/{1.73_m2} (ref >59)
GFR, EST NON AFRICAN AMERICAN: 98 mL/min/{1.73_m2} (ref >59)
GLUCOSE: 98 mg/dL (ref 65–99)
Globulin, Total: 2.7 g/dL (ref 1.5–4.5)
Potassium: 4.1 mmol/L (ref 3.5–5.2)
Sodium: 141 mmol/L (ref 134–144)
TOTAL PROTEIN: 7.4 g/dL (ref 6.0–8.5)

## 2017-05-15 LAB — LIPID PANEL WITH LDL/HDL RATIO
Cholesterol, Total: 143 mg/dL (ref 100–199)
HDL: 32 mg/dL — AB (ref >39)
LDL Calculated: 86 mg/dL (ref 0–99)
LDl/HDL Ratio: 2.7 ratio (ref 0.0–3.2)
TRIGLYCERIDES: 127 mg/dL (ref 0–149)
VLDL CHOLESTEROL CAL: 25 mg/dL (ref 5–40)

## 2017-05-15 LAB — CBC WITH DIFFERENTIAL
BASOS: 0 %
Basophils Absolute: 0 10*3/uL (ref 0.0–0.2)
EOS (ABSOLUTE): 0.2 10*3/uL (ref 0.0–0.4)
EOS: 2 %
HEMATOCRIT: 42 % (ref 34.0–46.6)
HEMOGLOBIN: 14.4 g/dL (ref 11.1–15.9)
IMMATURE GRANULOCYTES: 0 %
Immature Grans (Abs): 0 10*3/uL (ref 0.0–0.1)
Lymphocytes Absolute: 2.8 10*3/uL (ref 0.7–3.1)
Lymphs: 29 %
MCH: 27.9 pg (ref 26.6–33.0)
MCHC: 34.3 g/dL (ref 31.5–35.7)
MCV: 81 fL (ref 79–97)
MONOCYTES: 5 %
MONOS ABS: 0.5 10*3/uL (ref 0.1–0.9)
Neutrophils Absolute: 6.4 10*3/uL (ref 1.4–7.0)
Neutrophils: 64 %
RBC: 5.16 x10E6/uL (ref 3.77–5.28)
RDW: 14.7 % (ref 12.3–15.4)
WBC: 9.8 10*3/uL (ref 3.4–10.8)

## 2017-05-15 LAB — HEMOGLOBIN A1C
ESTIMATED AVERAGE GLUCOSE: 120 mg/dL
Hgb A1c MFr Bld: 5.8 % — ABNORMAL HIGH (ref 4.8–5.6)

## 2017-05-15 LAB — T4, FREE: FREE T4: 1.22 ng/dL (ref 0.82–1.77)

## 2017-05-15 LAB — INSULIN, RANDOM: INSULIN: 36 u[IU]/mL — ABNORMAL HIGH (ref 2.6–24.9)

## 2017-05-15 LAB — VITAMIN D 25 HYDROXY (VIT D DEFICIENCY, FRACTURES): Vit D, 25-Hydroxy: 43.6 ng/mL (ref 30.0–100.0)

## 2017-05-15 LAB — VITAMIN B12: Vitamin B-12: 438 pg/mL (ref 232–1245)

## 2017-05-15 LAB — T3: T3, Total: 145 ng/dL (ref 71–180)

## 2017-05-15 LAB — TSH: TSH: 2.49 u[IU]/mL (ref 0.450–4.500)

## 2017-05-15 LAB — FOLATE: Folate: 8.2 ng/mL (ref >3.0)

## 2017-05-20 ENCOUNTER — Other Ambulatory Visit: Payer: Self-pay | Admitting: Family Medicine

## 2017-05-20 DIAGNOSIS — N200 Calculus of kidney: Secondary | ICD-10-CM

## 2017-05-25 ENCOUNTER — Other Ambulatory Visit: Payer: Self-pay | Admitting: Interventional Cardiology

## 2017-05-28 ENCOUNTER — Ambulatory Visit (INDEPENDENT_AMBULATORY_CARE_PROVIDER_SITE_OTHER): Payer: BLUE CROSS/BLUE SHIELD | Admitting: Family Medicine

## 2017-05-28 VITALS — BP 93/60 | HR 61 | Temp 97.9°F | Ht 65.0 in | Wt 255.0 lb

## 2017-05-28 DIAGNOSIS — Z6841 Body Mass Index (BMI) 40.0 and over, adult: Secondary | ICD-10-CM

## 2017-05-28 DIAGNOSIS — I25118 Atherosclerotic heart disease of native coronary artery with other forms of angina pectoris: Secondary | ICD-10-CM | POA: Diagnosis not present

## 2017-05-28 DIAGNOSIS — Z9189 Other specified personal risk factors, not elsewhere classified: Secondary | ICD-10-CM

## 2017-05-28 DIAGNOSIS — R7303 Prediabetes: Secondary | ICD-10-CM

## 2017-05-28 MED ORDER — METFORMIN HCL 500 MG PO TABS: 500.0000 mg | ORAL_TABLET | Freq: Every day | ORAL | 0 refills | Status: DC

## 2017-05-30 ENCOUNTER — Other Ambulatory Visit: Payer: Self-pay

## 2017-05-30 DIAGNOSIS — F32A Depression, unspecified: Secondary | ICD-10-CM

## 2017-05-30 DIAGNOSIS — F419 Anxiety disorder, unspecified: Principal | ICD-10-CM

## 2017-05-30 DIAGNOSIS — F329 Major depressive disorder, single episode, unspecified: Secondary | ICD-10-CM

## 2017-05-30 MED ORDER — ESCITALOPRAM OXALATE 20 MG PO TABS: 20.0000 mg | ORAL_TABLET | Freq: Every day | ORAL | 1 refills | Status: DC

## 2017-05-31 NOTE — Progress Notes (Signed)
Office: [phone]  /  Fax: [phone]   HPI:   Chief Complaint: OBESITY Sheri [name] is here to discuss her progress with her obesity treatment plan. She is on the Category 2 plan and is following her eating plan approximately 90 % of the time. She states she is exercising 0 minutes 0 times per week. Sheri [name] didn't find herself hungry, did want to stress eat secondary to granddaughter (in utero) having heart problems. She notes no hunger.  Her weight is 255 lb (115.7 kg) today and has had a weight loss of 2 pounds over a period of 2 weeks since her last visit. She has lost 2 lbs since starting treatment with [place].  Pre-Diabetes Sheri [name] has a diagnosis of pre-diabetes based on her elevated Hgb A1c and was informed this puts her at greater risk of developing diabetes. Hgb A1c of 5.8 and insulin of 36.0, history of myocardial infarction. She is not taking metformin currently and continues to work on diet and exercise to decrease risk of diabetes. She denies nausea or hypoglycemia.  At risk for diabetes Sheri [name] is at higher than average risk for developing diabetes due to her obesity and pre-diabetes. She currently denies polyuria or polydipsia.  Coronary Artery Disease Sheri [name] is on lisinopril, Imdur, Nitrostat, Crestor, Plavix, and Lopressor. She has a history of myocardial infarction with stent.  ALLERGIES: Allergies  Allergen Reactions  . Latex Dermatitis  . Penicillins Rash    Has patient had a PCN reaction causing immediate rash, facial/tongue/throat swelling, SOB or lightheadedness with hypotension: Yes Has patient had a PCN reaction causing severe rash involving mucus membranes or skin necrosis: Unknown Has patient had a PCN reaction that required hospitalization: No Has patient had a PCN reaction occurring within the last 10 years: No If all of the above answers are "NO", then may proceed with Cephalosporin use.     MEDICATIONS: Current Outpatient Medications on File Prior to Visit   Medication Sig Dispense Refill  . Chromium 200 MCG CAPS Take by mouth. 30 each 0  . clopidogrel (PLAVIX) 75 MG tablet TAKE 1 TABLET (75 MG TOTAL) BY MOUTH DAILY. 90 tablet 2  . isosorbide mononitrate (IMDUR) 30 MG 24 hr tablet TAKE 1 TABLET BY MOUTH DAILY 90 tablet 2  . levothyroxine (SYNTHROID, LEVOTHROID) 25 MCG tablet TAKE 1 TABLET (25 MCG TOTAL) BY MOUTH DAILY BEFORE BREAKFAST. 30 tablet 5  . lisinopril (PRINIVIL,ZESTRIL) 5 MG tablet TAKE 1 TABLET (5 MG TOTAL) BY MOUTH DAILY. 90 tablet 2  . Melatonin 1 MG CAPS Take by mouth.  0  . metoprolol tartrate (LOPRESSOR) 25 MG tablet TAKE 1 TABLET TWICE A DAY 180 tablet 2  . nitroGLYCERIN (NITROSTAT) 0.4 MG SL tablet Place 1 tablet (0.4 mg total) under the tongue every 5 (five) minutes x 3 doses as needed for chest pain. 25 tablet 2  . ondansetron (ZOFRAN) 4 MG tablet Take 4 mg by mouth every 8 (eight) hours as needed for nausea or vomiting.    . rosuvastatin (CRESTOR) 20 MG tablet TAKE 1 TABLET DAILY 90 tablet 1  . tamsulosin (FLOMAX) 0.4 MG CAPS capsule TAKE 1 CAPSULE BY MOUTH EVERY DAY 30 capsule 0  . Vitamin D, Cholecalciferol, 1000 units CAPS Take 2,000 Units by mouth daily.     No current facility-administered medications on file prior to visit.     PAST MEDICAL HISTORY: Past Medical History:  Diagnosis Date  . Abnormal uterine bleeding   . Anomaly heart   . Anxiety   .  Coronary artery disease   . Depression   . Dyspnea    with exertion  . Dysrhythmia   . Heart attack ([place]) 03/2014  . Heart disease   . History of C-section   . History of kidney stones   . HLD (hyperlipidemia)   . Hx of heart artery stent   . Hypertension   . Hypothyroidism     PAST SURGICAL HISTORY: Past Surgical History:  Procedure Laterality Date  . CARDIAC CATHETERIZATION    . CARDIAC SURGERY    . [place]  . CHOLECYSTECTOMY    . CORONARY ANGIOPLASTY    . CYSTOSCOPY  10/10/2016   Procedure: CYSTOSCOPY;  Surgeon:  Will Bonnet, MD;  Location: [facility] OR[facility];  Service: Gynecology;;  . Consuela Mimes WITH STENT PLACEMENT Left 11/01/2015   Procedure: CYSTOSCOPY WITH STENT PLACEMENT;  Surgeon: Hollice Espy, MD;  Location: [facility];  Service: Urology;  Laterality: Left;  . CYSTOSCOPY/RETROGRADE/URETEROSCOPY Bilateral 11/01/2015   Procedure: CYSTOSCOPY/RETROGRADE/URETEROSCOPY;  Surgeon: Hollice Espy, MD;  Location: [facility];  Service: Urology;  Laterality: Bilateral;  . DILATION AND CURETTAGE OF UTERUS  2011  . ENDOMETRIAL ABLATION  2011  . LAPAROSCOPIC HYSTERECTOMY Bilateral 10/10/2016   Procedure: HYSTERECTOMY TOTAL LAPAROSCOPIC BILATERAL SALPINGECTOMY;  Surgeon: Will Bonnet, MD;  Location: [facility];  Service: Gynecology;  Laterality: Bilateral;  . LEFT HEART CATHETERIZATION WITH CORONARY ANGIOGRAM N/A 04/06/2014   Procedure: LEFT HEART CATHETERIZATION WITH CORONARY ANGIOGRAM;  Surgeon: Jettie Booze, MD;  Location: [facility] CATH LAB;  Service: Cardiovascular;  Laterality: N/A;    SOCIAL HISTORY: Social History   Tobacco Use  . Smoking status: Former Smoker    Packs/day: 1.00    Types: Cigarettes  . Smokeless tobacco: Never Used  . Tobacco comment: quit 20 years  Substance Use Topics  . Alcohol use: No    Alcohol/week: 0.0 oz  . Drug use: No    FAMILY HISTORY: Family History  Problem Relation Age of Onset  . Heart attack Father 42       died of MI at age 50  . High blood pressure Father   . High Cholesterol Father   . Heart disease Father   . Sudden death Father   . Hypertension Mother   . Hyperlipidemia Mother   . Hypothyroidism Mother   . Kidney Stones Sister   . Kidney Stones Brother   . Stroke Paternal Grandfather   . Liver cancer Maternal Grandmother   . Kidney disease Neg Hx   . Bladder Cancer Neg Hx     ROS: Review of Systems  Constitutional: Positive for weight loss.  Gastrointestinal: Negative for nausea.  Genitourinary: Negative for frequency.  Endo/Heme/Allergies:  Negative for polydipsia.       Negative hypoglycemia    PHYSICAL EXAM: Blood pressure 93/60, pulse 61, temperature 97.9 F (36.6 C), height 5\' 5"  (1.651 m), weight 255 lb (115.7 kg), last menstrual period 09/13/2016, SpO2 96 %. Body mass index is 42.43 kg/m. Physical Exam  Constitutional: She is oriented to person, place, and time. She appears well-developed and well-nourished.  Cardiovascular: Normal rate.  Pulmonary/Chest: Effort normal.  Musculoskeletal: Normal range of motion.  Neurological: She is oriented to person, place, and time.  Skin: Skin is warm and dry.  Psychiatric: She has a normal mood and affect. Her behavior is normal.  Vitals reviewed.   RECENT LABS AND TESTS: BMET    Component Value Date/Time   NA 141 05/14/2017 1145  NA 139 05/08/2014 0550   K 4.1 05/14/2017 1145   K 3.7 05/08/2014 0550   CL 102 05/14/2017 1145   CL 103 05/08/2014 0550   CO2 21 05/14/2017 1145   CO2 30 05/08/2014 0550   GLUCOSE 98 05/14/2017 1145   GLUCOSE 165 (H) 10/11/2016 0521   GLUCOSE 124 (H) 05/08/2014 0550   BUN 14 05/14/2017 1145   BUN 14 05/08/2014 0550   CREATININE 0.71 05/14/2017 1145   CREATININE 0.65 05/08/2014 0550   CALCIUM 9.5 05/14/2017 1145   CALCIUM 9.1 05/08/2014 0550   GFRNONAA 98 05/14/2017 1145   GFRNONAA >60 05/08/2014 0550   GFRAA 113 05/14/2017 1145   GFRAA >60 05/08/2014 0550   Lab Results  Component Value Date   HGBA1C 5.8 (H) 05/14/2017   HGBA1C 5.3 09/18/2016   HGBA1C 5.5 08/29/2016   HGBA1C 5.6 04/06/2014   Lab Results  Component Value Date   INSULIN 36.0 (H) 05/14/2017   CBC    Component Value Date/Time   WBC 9.8 05/14/2017 1145   WBC 13.8 (H) 10/11/2016 0521   RBC 5.16 05/14/2017 1145   RBC 4.45 10/11/2016 0521   HGB 14.4 05/14/2017 1145   HCT 42.0 05/14/2017 1145   PLT 265 10/11/2016 0521   PLT 291 04/11/2016 1616   MCV 81 05/14/2017 1145   MCV 85 05/07/2014 1018   MCH 27.9 05/14/2017 1145   MCH 29.6 10/11/2016 0521   MCHC  34.3 05/14/2017 1145   MCHC 34.9 10/11/2016 0521   RDW 14.7 05/14/2017 1145   RDW 13.6 05/07/2014 1018   LYMPHSABS 2.8 05/14/2017 1145   MONOABS 0.7 12/09/2014 0735   EOSABS 0.2 05/14/2017 1145   BASOSABS 0.0 05/14/2017 1145   Iron/TIBC/Ferritin/ %Sat No results found for: IRON, TIBC, FERRITIN, IRONPCTSAT Lipid Panel     Component Value Date/Time   CHOL 143 05/14/2017 1145   TRIG 127 05/14/2017 1145   HDL 32 (L) 05/14/2017 1145   CHOLHDL 4.1 09/21/2016 1424   CHOLHDL 4 07/27/2014 0943   VLDL 24.8 07/27/2014 0943   LDLCALC 86 05/14/2017 1145   Hepatic Function Panel     Component Value Date/Time   PROT 7.4 05/14/2017 1145   PROT 7.0 05/07/2014 1018   ALBUMIN 4.7 05/14/2017 1145   ALBUMIN 3.9 05/07/2014 1018   AST 43 (H) 05/14/2017 1145   AST 70 (H) 05/07/2014 1018   ALT 47 (H) 05/14/2017 1145   ALT 51 05/07/2014 1018   ALKPHOS 102 05/14/2017 1145   ALKPHOS 97 05/07/2014 1018   BILITOT 0.4 05/14/2017 1145   BILITOT 0.6 05/07/2014 1018   BILIDIR 0.09 09/21/2016 1424      Component Value Date/Time   TSH 2.490 05/14/2017 1145   TSH 2.47 12/27/2016 0846   TSH 2.92 10/31/2016 0847    ASSESSMENT AND PLAN: Prediabetes - Plan: metFORMIN (GLUCOPHAGE) 500 MG tablet  Coronary artery disease of native artery of native heart with stable angina pectoris (HCC)  At risk for diabetes mellitus  Class 3 severe obesity with serious comorbidity and body mass index (BMI) of 40.0 to 44.9 in adult, unspecified obesity type ([place])  PLAN:  Pre-Diabetes Sheri [name] will continue to work on weight loss, exercise, and decreasing simple carbohydrates in her diet to help decrease the risk of diabetes. We dicussed metformin including benefits and risks. She was informed that eating too many simple carbohydrates or too many calories at one sitting increases the likelihood of GI side effects. Nou agrees to start metformin 500 mg PO  q AM #30 with no refills. We will recheck labs in 3 months and  Sheri [name] agrees to follow up with our clinic in 2 weeks as directed to monitor her progress.  Diabetes risk counselling Sheri [name] was given extended (30 minutes) diabetes prevention counseling today. She is 52 y.o. female and has risk factors for diabetes including obesity and pre-diabetes. We discussed intensive lifestyle modifications today with an emphasis on weight loss as well as increasing exercise and decreasing simple carbohydrates in her diet.  Coronary Artery Disease Sheri [name] agrees to continue current medications. She is to notify Cardiologist if lightheaded. Sheri [name] agrees to follow up with our clinic in 2 weeks.  Obesity Sheri [name] is currently in the action stage of change. As such, her goal is to continue with weight loss efforts She has agreed to follow the Category 2 plan Sheri [name] has been instructed to work up to a goal of 150 minutes of combined cardio and strengthening exercise per week for weight loss and overall health benefits. We discussed the following Behavioral Modification Strategies today: increasing lean protein intake, increasing vegetables, work on meal planning and easy cooking plans, better snacking choices, and planning for success   Sheri [name] has agreed to follow up with our clinic in 2 weeks. She was informed of the importance of frequent follow up visits to maximize her success with intensive lifestyle modifications for her multiple health conditions.   OBESITY BEHAVIORAL INTERVENTION VISIT  Today's visit was # 2 out of 22.  Starting weight: 257 lbs Starting date: 05/14/17 Today's weight : 255 lbs  Today's date: 05/28/2017 Total lbs lost to date: 2 (Patients must lose 7 lbs in the first 6 months to continue with counseling)   ASK: We discussed the diagnosis of obesity with Sheri [name] today and Sheri [name] agreed to give [place] permission to discuss obesity behavioral modification therapy today.  ASSESS: Sheri [name] has the diagnosis of obesity and her BMI today is  42.43 Sheri [name] is in the action stage of change   ADVISE: Sheri [name] was educated on the multiple health risks of obesity as well as the benefit of weight loss to improve her health. She was advised of the need for long term treatment and the importance of lifestyle modifications.  AGREE: Multiple dietary modification options and treatment options were discussed and  Sheri [name] agreed to the above obesity treatment plan.  I, Trixie Dredge, am acting as transcriptionist for Ilene Qua, MD  I have reviewed the above documentation for accuracy and completeness, and I agree with the above. - Ilene Qua, MD

## 2017-06-01 ENCOUNTER — Other Ambulatory Visit: Payer: Self-pay | Admitting: Interventional Cardiology

## 2017-06-01 MED ORDER — ISOSORBIDE MONONITRATE ER 30 MG PO TB24: 30.0000 mg | ORAL_TABLET | Freq: Every day | ORAL | 0 refills | Status: DC

## 2017-06-12 ENCOUNTER — Ambulatory Visit (INDEPENDENT_AMBULATORY_CARE_PROVIDER_SITE_OTHER): Payer: BLUE CROSS/BLUE SHIELD | Admitting: Family Medicine

## 2017-06-12 VITALS — BP 107/73 | HR 69 | Temp 97.9°F | Ht 65.0 in | Wt 252.0 lb

## 2017-06-12 DIAGNOSIS — E038 Other specified hypothyroidism: Secondary | ICD-10-CM

## 2017-06-12 DIAGNOSIS — Z6841 Body Mass Index (BMI) 40.0 and over, adult: Secondary | ICD-10-CM | POA: Diagnosis not present

## 2017-06-12 DIAGNOSIS — R7303 Prediabetes: Secondary | ICD-10-CM | POA: Diagnosis not present

## 2017-06-12 NOTE — Progress Notes (Signed)
Office: [phone]  /  Fax: [phone]   HPI:   Chief Complaint: OBESITY Sheri [name] is here to discuss her progress with her obesity treatment plan. She is on the Category 2 plan and is following her eating plan approximately 50 % of the time. She states she is exercising 0 minutes 0 times per week. Sheri [name] has some family stressors with grand baby and possible heart defect that was found to be fine. She started back on the category 2 plan a few days ago. Her weight is 252 lb (114.3 kg) today and has had a weight loss of 3 pounds over a period of 2 weeks since her last visit. She has lost 5 lbs since starting treatment with [place].  Pre-Diabetes Sheri [name] has a diagnosis of prediabetes based on her elevated Hgb A1c and was informed this puts her at greater risk of developing diabetes. She  Had GI upset the first week on metformin. Sheri [name] continues to work on diet and exercise to decrease risk of diabetes. She denies hypoglycemia.  Hypothyroidism Sheri [name] has a diagnosis of hypothyroidism. Her last TSH was within normal limits on 05/14/17 She is on levothyroxine. She denies hot or cold intolerance, fatigue or palpitations.  ALLERGIES: Allergies  Allergen Reactions  . Latex Dermatitis  . Penicillins Rash    Has patient had a PCN reaction causing immediate rash, facial/tongue/throat swelling, SOB or lightheadedness with hypotension: Yes Has patient had a PCN reaction causing severe rash involving mucus membranes or skin necrosis: Unknown Has patient had a PCN reaction that required hospitalization: No Has patient had a PCN reaction occurring within the last 10 years: No If all of the above answers are "NO", then may proceed with Cephalosporin use.     MEDICATIONS: Current Outpatient Medications on File Prior to Visit  Medication Sig Dispense Refill  . Chromium 200 MCG CAPS Take by mouth. 30 each 0  . clopidogrel (PLAVIX) 75 MG tablet TAKE 1 TABLET (75 MG TOTAL) BY MOUTH DAILY. 90 tablet 2  .  escitalopram (LEXAPRO) 20 MG tablet Take 1 tablet (20 mg total) by mouth daily. 90 tablet 1  . isosorbide mononitrate (IMDUR) 30 MG 24 hr tablet Take 1 tablet (30 mg total) by mouth daily. Please make yearly appt with Dr. Irish Lack for August before anymore refills. 1st attempt 90 tablet 0  . levothyroxine (SYNTHROID, LEVOTHROID) 25 MCG tablet TAKE 1 TABLET (25 MCG TOTAL) BY MOUTH DAILY BEFORE BREAKFAST. 30 tablet 5  . lisinopril (PRINIVIL,ZESTRIL) 5 MG tablet TAKE 1 TABLET (5 MG TOTAL) BY MOUTH DAILY. 90 tablet 2  . Melatonin 1 MG CAPS Take by mouth.  0  . metFORMIN (GLUCOPHAGE) 500 MG tablet Take 1 tablet (500 mg total) by mouth daily with breakfast. 30 tablet 0  . metoprolol tartrate (LOPRESSOR) 25 MG tablet TAKE 1 TABLET TWICE A DAY 180 tablet 2  . nitroGLYCERIN (NITROSTAT) 0.4 MG SL tablet Place 1 tablet (0.4 mg total) under the tongue every 5 (five) minutes x 3 doses as needed for chest pain. 25 tablet 2  . ondansetron (ZOFRAN) 4 MG tablet Take 4 mg by mouth every 8 (eight) hours as needed for nausea or vomiting.    . rosuvastatin (CRESTOR) 20 MG tablet TAKE 1 TABLET DAILY 90 tablet 1  . tamsulosin (FLOMAX) 0.4 MG CAPS capsule TAKE 1 CAPSULE BY MOUTH EVERY DAY 30 capsule 0  . Vitamin D, Cholecalciferol, 1000 units CAPS Take 2,000 Units by mouth daily.     No current facility-administered medications on  file prior to visit.     PAST MEDICAL HISTORY: Past Medical History:  Diagnosis Date  . Abnormal uterine bleeding   . Anomaly heart   . Anxiety   . Coronary artery disease   . Depression   . Dyspnea    with exertion  . Dysrhythmia   . Heart attack ([place]) 03/2014  . Heart disease   . History of C-section   . History of kidney stones   . HLD (hyperlipidemia)   . Hx of heart artery stent   . Hypertension   . Hypothyroidism     PAST SURGICAL HISTORY: Past Surgical History:  Procedure Laterality Date  . CARDIAC CATHETERIZATION    . CARDIAC SURGERY    . [place]  . CHOLECYSTECTOMY    . CORONARY ANGIOPLASTY    . CYSTOSCOPY  10/10/2016   Procedure: CYSTOSCOPY;  Surgeon: Will Bonnet, MD;  Location: [facility] OR[facility];  Service: Gynecology;;  . Consuela Mimes WITH STENT PLACEMENT Left 11/01/2015   Procedure: CYSTOSCOPY WITH STENT PLACEMENT;  Surgeon: Hollice Espy, MD;  Location: [facility];  Service: Urology;  Laterality: Left;  . CYSTOSCOPY/RETROGRADE/URETEROSCOPY Bilateral 11/01/2015   Procedure: CYSTOSCOPY/RETROGRADE/URETEROSCOPY;  Surgeon: Hollice Espy, MD;  Location: [facility];  Service: Urology;  Laterality: Bilateral;  . DILATION AND CURETTAGE OF UTERUS  2011  . ENDOMETRIAL ABLATION  2011  . LAPAROSCOPIC HYSTERECTOMY Bilateral 10/10/2016   Procedure: HYSTERECTOMY TOTAL LAPAROSCOPIC BILATERAL SALPINGECTOMY;  Surgeon: Will Bonnet, MD;  Location: [facility];  Service: Gynecology;  Laterality: Bilateral;  . LEFT HEART CATHETERIZATION WITH CORONARY ANGIOGRAM N/A 04/06/2014   Procedure: LEFT HEART CATHETERIZATION WITH CORONARY ANGIOGRAM;  Surgeon: Jettie Booze, MD;  Location: [facility] C[facility];  Service: Cardiovascular;  Laterality: N/A;    SOCIAL HISTORY: Social History   Tobacco Use  . Smoking status: Former Smoker    Packs/day: 1.00    Types: Cigarettes  . Smokeless tobacco: Never Used  . Tobacco comment: quit 20 years  Substance Use Topics  . Alcohol use: No    Alcohol/week: 0.0 oz  . Drug use: No    FAMILY HISTORY: Family History  Problem Relation Age of Onset  . Heart attack Father 61       died of MI at age 20  . High blood pressure Father   . High Cholesterol Father   . Heart disease Father   . Sudden death Father   . Hypertension Mother   . Hyperlipidemia Mother   . Hypothyroidism Mother   . Kidney Stones Sister   . Kidney Stones Brother   . Stroke Paternal Grandfather   . Liver cancer Maternal Grandmother   . Kidney disease Neg Hx   . Bladder Cancer Neg Hx     ROS: Review of Systems  Constitutional:  Positive for weight loss. Negative for malaise/fatigue.  Cardiovascular: Negative for palpitations.  Gastrointestinal: Positive for diarrhea and nausea.  Endo/Heme/Allergies:       Negative for hypoglycemia Negative for hot or cold intolerance    PHYSICAL EXAM: Blood pressure 107/73, pulse 69, temperature 97.9 F (36.6 C), temperature source Oral, height 5\' 5"  (1.651 m), weight 252 lb (114.3 kg), last menstrual period 09/13/2016, SpO2 96 %. Body mass index is 41.93 kg/m. Physical Exam  Constitutional: She is oriented to person, place, and time. She appears well-developed and well-nourished.  Cardiovascular: Normal rate.  Pulmonary/Chest: Effort normal.  Musculoskeletal: Normal range of motion.  Neurological: She is oriented to person, place,  and time.  Skin: Skin is warm and dry.  Psychiatric: She has a normal mood and affect. Her behavior is normal.  Vitals reviewed.   RECENT LABS AND TESTS: BMET    Component Value Date/Time   NA 141 05/14/2017 1145   NA 139 05/08/2014 0550   K 4.1 05/14/2017 1145   K 3.7 05/08/2014 0550   CL 102 05/14/2017 1145   CL 103 05/08/2014 0550   CO2 21 05/14/2017 1145   CO2 30 05/08/2014 0550   GLUCOSE 98 05/14/2017 1145   GLUCOSE 165 (H) 10/11/2016 0521   GLUCOSE 124 (H) 05/08/2014 0550   BUN 14 05/14/2017 1145   BUN 14 05/08/2014 0550   CREATININE 0.71 05/14/2017 1145   CREATININE 0.65 05/08/2014 0550   CALCIUM 9.5 05/14/2017 1145   CALCIUM 9.1 05/08/2014 0550   GFRNONAA 98 05/14/2017 1145   GFRNONAA >60 05/08/2014 0550   GFRAA 113 05/14/2017 1145   GFRAA >60 05/08/2014 0550   Lab Results  Component Value Date   HGBA1C 5.8 (H) 05/14/2017   HGBA1C 5.3 09/18/2016   HGBA1C 5.5 08/29/2016   HGBA1C 5.6 04/06/2014   Lab Results  Component Value Date   INSULIN 36.0 (H) 05/14/2017   CBC    Component Value Date/Time   WBC 9.8 05/14/2017 1145   WBC 13.8 (H) 10/11/2016 0521   RBC 5.16 05/14/2017 1145   RBC 4.45 10/11/2016 0521    HGB 14.4 05/14/2017 1145   HCT 42.0 05/14/2017 1145   PLT 265 10/11/2016 0521   PLT 291 04/11/2016 1616   MCV 81 05/14/2017 1145   MCV 85 05/07/2014 1018   MCH 27.9 05/14/2017 1145   MCH 29.6 10/11/2016 0521   MCHC 34.3 05/14/2017 1145   MCHC 34.9 10/11/2016 0521   RDW 14.7 05/14/2017 1145   RDW 13.6 05/07/2014 1018   LYMPHSABS 2.8 05/14/2017 1145   MONOABS 0.7 12/09/2014 0735   EOSABS 0.2 05/14/2017 1145   BASOSABS 0.0 05/14/2017 1145   Iron/TIBC/Ferritin/ %Sat No results found for: IRON, TIBC, FERRITIN, IRONPCTSAT Lipid Panel     Component Value Date/Time   CHOL 143 05/14/2017 1145   TRIG 127 05/14/2017 1145   HDL 32 (L) 05/14/2017 1145   CHOLHDL 4.1 09/21/2016 1424   CHOLHDL 4 07/27/2014 0943   VLDL 24.8 07/27/2014 0943   LDLCALC 86 05/14/2017 1145   Hepatic Function Panel     Component Value Date/Time   PROT 7.4 05/14/2017 1145   PROT 7.0 05/07/2014 1018   ALBUMIN 4.7 05/14/2017 1145   ALBUMIN 3.9 05/07/2014 1018   AST 43 (H) 05/14/2017 1145   AST 70 (H) 05/07/2014 1018   ALT 47 (H) 05/14/2017 1145   ALT 51 05/07/2014 1018   ALKPHOS 102 05/14/2017 1145   ALKPHOS 97 05/07/2014 1018   BILITOT 0.4 05/14/2017 1145   BILITOT 0.6 05/07/2014 1018   BILIDIR 0.09 09/21/2016 1424      Component Value Date/Time   TSH 2.490 05/14/2017 1145   TSH 2.47 12/27/2016 0846   TSH 2.92 10/31/2016 0847   Results for Pew, [name] "LYNN" (MRN [id number]) as of 06/12/2017 17:38  Ref. Range 05/14/2017 11:45  Vitamin D, 25-Hydroxy Latest Ref Range: 30.0 - 100.0 ng/mL 43.6   ASSESSMENT AND PLAN: Prediabetes  Other specified hypothyroidism  Class 3 severe obesity with serious comorbidity and body mass index (BMI) of 40.0 to 44.9 in adult, unspecified obesity type [facility])  PLAN:  Pre-Diabetes Sheri [name] will continue to work on weight loss, exercise,  and decreasing simple carbohydrates in her diet to help decrease the risk of diabetes. We dicussed metformin including benefits and  risks. She was informed that eating too many simple carbohydrates or too many calories at one sitting increases the likelihood of GI side effects. Larayah will continue metformin for now and a prescription was not written today. Sheri [name] agreed to follow up with [place] as directed to monitor her progress.  Hypothyroidism Sheri [name] was informed of the importance of good thyroid control to help with weight loss efforts. She was also informed that supertheraputic thyroid levels are dangerous and will not improve weight loss results. Sheri [name] will continue levothyroxine 25 mcg daily and follow up as directed.  We spent > than 50% of the 15 minute visit on the counseling as documented in the note.  Obesity Sheri [name] is currently in the action stage of change. As such, her goal is to continue with weight loss efforts She has agreed to keep a food journal with 400 to 500 calories and 35 grams of protein at supper daily and follow the Category 2 plan Sheri [name] has been instructed to work up to a goal of 150 minutes of combined cardio and strengthening exercise per week for weight loss and overall health benefits. We discussed the following Behavioral Modification Strategies today: better snacking choices, planning for success, keep a strict food journal, increasing lean protein intake, increasing vegetables and work on meal planning and easy cooking plans  Mayerly has agreed to follow up with our clinic in 2 weeks. She was informed of the importance of frequent follow up visits to maximize her success with intensive lifestyle modifications for her multiple health conditions.   OBESITY BEHAVIORAL INTERVENTION VISIT  Today's visit was # 3 out of 22.  Starting weight: 257 lbs Starting date: 05/14/17 Today's weight : 252 lbs Today's date: 06/12/2017 Total lbs lost to date: 5 (Patients must lose 7 lbs in the first 6 months to continue with counseling)   ASK: We discussed the diagnosis of obesity with Sheri [name] today  and Sheri [name] agreed to give [place] permission to discuss obesity behavioral modification therapy today.  ASSESS: Sheri [name] has the diagnosis of obesity and her BMI today is 41.93 Sheri [name] is in the action stage of change   ADVISE: Sheri [name] was educated on the multiple health risks of obesity as well as the benefit of weight loss to improve her health. She was advised of the need for long term treatment and the importance of lifestyle modifications.  AGREE: Multiple dietary modification options and treatment options were discussed and  Margarine agreed to the above obesity treatment plan.  I, Doreene Nest, am acting as transcriptionist for Eber Jones, MD  I have reviewed the above documentation for accuracy and completeness, and I agree with the above. - Ilene Qua, MD

## 2017-06-15 ENCOUNTER — Other Ambulatory Visit: Payer: Self-pay | Admitting: Family Medicine

## 2017-06-15 DIAGNOSIS — N200 Calculus of kidney: Secondary | ICD-10-CM

## 2017-07-04 ENCOUNTER — Ambulatory Visit (INDEPENDENT_AMBULATORY_CARE_PROVIDER_SITE_OTHER): Payer: BLUE CROSS/BLUE SHIELD | Admitting: Family Medicine

## 2017-07-04 VITALS — BP 115/78 | HR 72 | Temp 97.6°F | Ht 65.0 in | Wt 254.0 lb

## 2017-07-04 DIAGNOSIS — R7303 Prediabetes: Secondary | ICD-10-CM | POA: Diagnosis not present

## 2017-07-04 DIAGNOSIS — I10 Essential (primary) hypertension: Secondary | ICD-10-CM

## 2017-07-04 DIAGNOSIS — Z9189 Other specified personal risk factors, not elsewhere classified: Secondary | ICD-10-CM | POA: Diagnosis not present

## 2017-07-04 DIAGNOSIS — Z6841 Body Mass Index (BMI) 40.0 and over, adult: Secondary | ICD-10-CM

## 2017-07-04 MED ORDER — METFORMIN HCL 500 MG PO TABS: 500.0000 mg | ORAL_TABLET | Freq: Every day | ORAL | 0 refills | Status: DC

## 2017-07-04 NOTE — Progress Notes (Signed)
Office: [phone]  /  Fax: [phone]   HPI:   Chief Complaint: OBESITY Sheri [name] is here to discuss her progress with her obesity treatment plan. She is on the  keep a food journal with 400 to 500 calories and 35 grams of protein at supper daily and follow the Category 2 plan and is following her eating plan approximately 40 % of the time. She states she is exercising 0 minutes 0 times per week. Sheri [name] is struggling with food choices, and she hasn't been weighing protein or following the plan. She is really wanting a referral to the [facility]. Her weight is 254 lb (115.2 kg) today and has had a weight gain of 2 pounds over a period of 3 weeks since her last visit. She has lost 3 lbs since starting treatment with [place].  Pre-Diabetes Sheri [name] has a diagnosis of prediabetes based on her elevated Hgb A1c and was informed this puts her at greater risk of developing diabetes. She is having significant sweets cravings. She has been out of metformin for one week. Sheri [name] continues to work on diet and exercise to decrease risk of diabetes. She denies nausea or hypoglycemia.  At risk for diabetes Sheri [name] is at higher than average risk for developing diabetes due to her obesity and pre-diabetes. She currently denies polyuria or polydipsia.  Hypertension Sheri [name] is a 52 y.o. female with hypertension. She has a history of myocardial infarction. Park Pope Dobbin denies chest pain, chest pressure or headache She is working weight loss to help control her blood pressure with the goal of decreasing her risk of heart attack and stroke. Amandas blood pressure is controlled today.  ALLERGIES: Allergies  Allergen Reactions  . Latex Dermatitis  . Penicillins Rash    Has patient had a PCN reaction causing immediate rash, facial/tongue/throat swelling, SOB or lightheadedness with hypotension: Yes Has patient had a PCN reaction causing severe rash involving mucus membranes or skin necrosis:  Unknown Has patient had a PCN reaction that required hospitalization: No Has patient had a PCN reaction occurring within the last 10 years: No If all of the above answers are "NO", then may proceed with Cephalosporin use.     MEDICATIONS: Current Outpatient Medications on File Prior to Visit  Medication Sig Dispense Refill  . Chromium 200 MCG CAPS Take by mouth. 30 each 0  . clopidogrel (PLAVIX) 75 MG tablet TAKE 1 TABLET (75 MG TOTAL) BY MOUTH DAILY. 90 tablet 2  . escitalopram (LEXAPRO) 20 MG tablet Take 1 tablet (20 mg total) by mouth daily. 90 tablet 1  . isosorbide mononitrate (IMDUR) 30 MG 24 hr tablet Take 1 tablet (30 mg total) by mouth daily. Please make yearly appt with Dr. Irish Lack for August before anymore refills. 1st attempt 90 tablet 0  . levothyroxine (SYNTHROID, LEVOTHROID) 25 MCG tablet TAKE 1 TABLET (25 MCG TOTAL) BY MOUTH DAILY BEFORE BREAKFAST. 30 tablet 5  . lisinopril (PRINIVIL,ZESTRIL) 5 MG tablet TAKE 1 TABLET (5 MG TOTAL) BY MOUTH DAILY. 90 tablet 2  . Melatonin 1 MG CAPS Take by mouth.  0  . metFORMIN (GLUCOPHAGE) 500 MG tablet Take 1 tablet (500 mg total) by mouth daily with breakfast. 30 tablet 0  . metoprolol tartrate (LOPRESSOR) 25 MG tablet TAKE 1 TABLET TWICE A DAY 180 tablet 2  . nitroGLYCERIN (NITROSTAT) 0.4 MG SL tablet Place 1 tablet (0.4 mg total) under the tongue every 5 (five) minutes x 3 doses as needed for chest pain. 25 tablet  2  . ondansetron (ZOFRAN) 4 MG tablet Take 4 mg by mouth every 8 (eight) hours as needed for nausea or vomiting.    . rosuvastatin (CRESTOR) 20 MG tablet TAKE 1 TABLET DAILY 90 tablet 1  . tamsulosin (FLOMAX) 0.4 MG CAPS capsule TAKE 1 CAPSULE BY MOUTH EVERY DAY 30 capsule 0  . Vitamin D, Cholecalciferol, 1000 units CAPS Take 2,000 Units by mouth daily.     No current facility-administered medications on file prior to visit.     PAST MEDICAL HISTORY: Past Medical History:  Diagnosis Date  . Abnormal uterine bleeding   .  Anomaly heart   . Anxiety   . Coronary artery disease   . Depression   . Dyspnea    with exertion  . Dysrhythmia   . Heart attack ([place]) 03/2014  . Heart disease   . History of C-section   . History of kidney stones   . HLD (hyperlipidemia)   . Hx of heart artery stent   . Hypertension   . Hypothyroidism     PAST SURGICAL HISTORY: Past Surgical History:  Procedure Laterality Date  . CARDIAC CATHETERIZATION    . CARDIAC SURGERY    . [place]  . CHOLECYSTECTOMY    . CORONARY ANGIOPLASTY    . CYSTOSCOPY  10/10/2016   Procedure: CYSTOSCOPY;  Surgeon: Will Bonnet, MD;  Location: [facility] OR[facility];  Service: Gynecology;;  . Sheri [name] WITH STENT PLACEMENT Left 11/01/2015   Procedure: CYSTOSCOPY WITH STENT PLACEMENT;  Surgeon: Hollice Espy, MD;  Location: [facility];  Service: Urology;  Laterality: Left;  . CYSTOSCOPY/RETROGRADE/URETEROSCOPY Bilateral 11/01/2015   Procedure: CYSTOSCOPY/RETROGRADE/URETEROSCOPY;  Surgeon: Hollice Espy, MD;  Location: [facility];  Service: Urology;  Laterality: Bilateral;  . DILATION AND CURETTAGE OF UTERUS  2011  . ENDOMETRIAL ABLATION  2011  . LAPAROSCOPIC HYSTERECTOMY Bilateral 10/10/2016   Procedure: HYSTERECTOMY TOTAL LAPAROSCOPIC BILATERAL SALPINGECTOMY;  Surgeon: Will Bonnet, MD;  Location: [facility];  Service: Gynecology;  Laterality: Bilateral;  . LEFT HEART CATHETERIZATION WITH CORONARY ANGIOGRAM N/A 04/06/2014   Procedure: LEFT HEART CATHETERIZATION WITH CORONARY ANGIOGRAM;  Surgeon: Jettie Booze, MD;  Location: [facility] CATH LAB;  Service: Cardiovascular;  Laterality: N/A;    SOCIAL HISTORY: Social History   Tobacco Use  . Smoking status: Former Smoker    Packs/day: 1.00    Types: Cigarettes  . Smokeless tobacco: Never Used  . Tobacco comment: quit 20 years  Substance Use Topics  . Alcohol use: No    Alcohol/week: 0.0 oz  . Drug use: No    FAMILY HISTORY: Family History  Problem Relation Age of  Onset  . Heart attack Father 57       died of MI at age 49  . High blood pressure Father   . High Cholesterol Father   . Heart disease Father   . Sudden death Father   . Hypertension Mother   . Hyperlipidemia Mother   . Hypothyroidism Mother   . Kidney Stones Sister   . Kidney Stones Brother   . Stroke Paternal Grandfather   . Liver cancer Maternal Grandmother   . Kidney disease Neg Hx   . Bladder Cancer Neg Hx     ROS: Review of Systems  Constitutional: Negative for weight loss.  Cardiovascular: Negative for chest pain.       Negative for chest pressure  Gastrointestinal: Negative for nausea.  Genitourinary: Negative for frequency.  Neurological: Negative for headaches.  Endo/Heme/Allergies: Negative for polydipsia.       Negative for hypoglycemia Positive for cravings    PHYSICAL EXAM: Blood pressure 115/78, pulse 72, temperature 97.6 F (36.4 C), temperature source Oral, height 5\' 5"  (1.651 m), weight 254 lb (115.2 kg), last menstrual period 09/13/2016, SpO2 95 %. Body mass index is 42.27 kg/m. Physical Exam  Constitutional: She is oriented to person, place, and time. She appears well-developed and well-nourished.  Cardiovascular: Normal rate.  Pulmonary/Chest: Effort normal.  Musculoskeletal: Normal range of motion.  Neurological: She is oriented to person, place, and time.  Skin: Skin is warm and dry.  Psychiatric: She has a normal mood and affect. Her behavior is normal.  Vitals reviewed.   RECENT LABS AND TESTS: BMET    Component Value Date/Time   NA 141 05/14/2017 1145   NA 139 05/08/2014 0550   K 4.1 05/14/2017 1145   K 3.7 05/08/2014 0550   CL 102 05/14/2017 1145   CL 103 05/08/2014 0550   CO2 21 05/14/2017 1145   CO2 30 05/08/2014 0550   GLUCOSE 98 05/14/2017 1145   GLUCOSE 165 (H) 10/11/2016 0521   GLUCOSE 124 (H) 05/08/2014 0550   BUN 14 05/14/2017 1145   BUN 14 05/08/2014 0550   CREATININE 0.71 05/14/2017 1145   CREATININE 0.65 05/08/2014  0550   CALCIUM 9.5 05/14/2017 1145   CALCIUM 9.1 05/08/2014 0550   GFRNONAA 98 05/14/2017 1145   GFRNONAA >60 05/08/2014 0550   GFRAA 113 05/14/2017 1145   GFRAA >60 05/08/2014 0550   Lab Results  Component Value Date   HGBA1C 5.8 (H) 05/14/2017   HGBA1C 5.3 09/18/2016   HGBA1C 5.5 08/29/2016   HGBA1C 5.6 04/06/2014   Lab Results  Component Value Date   INSULIN 36.0 (H) 05/14/2017   CBC    Component Value Date/Time   WBC 9.8 05/14/2017 1145   WBC 13.8 (H) 10/11/2016 0521   RBC 5.16 05/14/2017 1145   RBC 4.45 10/11/2016 0521   HGB 14.4 05/14/2017 1145   HCT 42.0 05/14/2017 1145   PLT 265 10/11/2016 0521   PLT 291 04/11/2016 1616   MCV 81 05/14/2017 1145   MCV 85 05/07/2014 1018   MCH 27.9 05/14/2017 1145   MCH 29.6 10/11/2016 0521   MCHC 34.3 05/14/2017 1145   MCHC 34.9 10/11/2016 0521   RDW 14.7 05/14/2017 1145   RDW 13.6 05/07/2014 1018   LYMPHSABS 2.8 05/14/2017 1145   MONOABS 0.7 12/09/2014 0735   EOSABS 0.2 05/14/2017 1145   BASOSABS 0.0 05/14/2017 1145   Iron/TIBC/Ferritin/ %Sat No results found for: IRON, TIBC, FERRITIN, IRONPCTSAT Lipid Panel     Component Value Date/Time   CHOL 143 05/14/2017 1145   TRIG 127 05/14/2017 1145   HDL 32 (L) 05/14/2017 1145   CHOLHDL 4.1 09/21/2016 1424   CHOLHDL 4 07/27/2014 0943   VLDL 24.8 07/27/2014 0943   LDLCALC 86 05/14/2017 1145   Hepatic Function Panel     Component Value Date/Time   PROT 7.4 05/14/2017 1145   PROT 7.0 05/07/2014 1018   ALBUMIN 4.7 05/14/2017 1145   ALBUMIN 3.9 05/07/2014 1018   AST 43 (H) 05/14/2017 1145   AST 70 (H) 05/07/2014 1018   ALT 47 (H) 05/14/2017 1145   ALT 51 05/07/2014 1018   ALKPHOS 102 05/14/2017 1145   ALKPHOS 97 05/07/2014 1018   BILITOT 0.4 05/14/2017 1145   BILITOT 0.6 05/07/2014 1018   BILIDIR 0.09 09/21/2016 1424      Component Value  Date/Time   TSH 2.490 05/14/2017 1145   TSH 2.47 12/27/2016 0846   TSH 2.92 10/31/2016 0847   Results for App, Jaritza L  "LYNN" (MRN [id number]) as of 07/04/2017 08:44  Ref. Range 05/14/2017 11:45  Vitamin D, 25-Hydroxy Latest Ref Range: 30.0 - 100.0 ng/mL 43.6   ASSESSMENT AND PLAN: Prediabetes - Plan: metFORMIN (GLUCOPHAGE) 500 MG tablet  Essential hypertension  At risk for diabetes mellitus  Class 3 severe obesity with serious comorbidity and body mass index (BMI) of 40.0 to 44.9 in adult, unspecified obesity type [facility])  PLAN:  Pre-Diabetes Hilda will continue to work on weight loss, exercise, and decreasing simple carbohydrates in her diet to help decrease the risk of diabetes. We dicussed metformin including benefits and risks. She was informed that eating too many simple carbohydrates or too many calories at one sitting increases the likelihood of GI side effects. Tresia requested metformin for now and a prescription was written today for 1 month refill. Nessie agreed to follow up with [place] as directed to monitor her progress.  Diabetes risk counseling Zamyra was given extended (15 minutes) diabetes prevention counseling today. She is 52 y.o. female and has risk factors for diabetes including obesity and pre-diabetes. We discussed intensive lifestyle modifications today with an emphasis on weight loss as well as increasing exercise and decreasing simple carbohydrates in her diet.  Hypertension We discussed sodium restriction, working on healthy weight loss, and a regular exercise program as the means to achieve improved blood pressure control. Zairah agreed with this plan and agreed to follow up as directed. We will continue to monitor her blood pressure as well as her progress with the above lifestyle modifications. She will continue her medications as prescribed and will watch for signs of hypotension as she continues her lifestyle modifications.  Obesity Konstantina is not currently in the action stage of change. As such, her goal is to maintain weight for now We discussed the following Behavioral  Modification Strategies today: planning for success, increasing lean protein intake, increasing vegetables and work on meal planning and easy cooking plans  We will refer Cimone to [place] has agreed to follow up with our clinic as needed. She was informed of the importance of frequent follow up visits to maximize her success with intensive lifestyle modifications for her multiple health conditions.   OBESITY BEHAVIORAL INTERVENTION VISIT  Today's visit was # 4 out of 22.  Starting weight: 257 lbs Starting date: 05/14/17 Today's weight : 254 lbs  Today's date: 07/04/2017 Total lbs lost to date: 3 (Patients must lose 7 lbs in the first 6 months to continue with counseling)   ASK: We discussed the diagnosis of obesity with Park Pope Brickell today and Estill Bamberg agreed to give [place] permission to discuss obesity behavioral modification therapy today.  ASSESS: Shamiya has the diagnosis of obesity and her BMI today is 42.27 Javia is in the action stage of change   ADVISE: Marjie was educated on the multiple health risks of obesity as well as the benefit of weight loss to improve her health. She was advised of the need for long term treatment and the importance of lifestyle modifications.  AGREE: Multiple dietary modification options and treatment options were discussed and  [place] agreed to the above obesity treatment plan.  I, Doreene Nest, am acting as transcriptionist for Eber Jones, MD  I have reviewed the above documentation for accuracy and completeness, and I agree with the above. - Ilene Qua,  MD

## 2017-07-12 ENCOUNTER — Encounter (INDEPENDENT_AMBULATORY_CARE_PROVIDER_SITE_OTHER): Payer: Self-pay | Admitting: Family Medicine

## 2017-07-14 ENCOUNTER — Other Ambulatory Visit: Payer: Self-pay | Admitting: Interventional Cardiology

## 2017-07-30 ENCOUNTER — Other Ambulatory Visit: Payer: Self-pay | Admitting: Interventional Cardiology

## 2017-08-01 ENCOUNTER — Encounter (INDEPENDENT_AMBULATORY_CARE_PROVIDER_SITE_OTHER): Payer: Self-pay | Admitting: Family Medicine

## 2017-08-01 ENCOUNTER — Encounter: Payer: Self-pay | Admitting: Interventional Cardiology

## 2017-08-01 ENCOUNTER — Encounter: Payer: Self-pay | Admitting: Cardiology

## 2017-08-01 MED ORDER — NITROGLYCERIN 0.4 MG SL SUBL: 0.4000 mg | SUBLINGUAL_TABLET | SUBLINGUAL | 2 refills | Status: DC | PRN

## 2017-08-01 NOTE — Telephone Encounter (Signed)
Called patient regarding MyChart message below. Patient states that she has had intermittent episodes of numbness or tingling in her lips and left hand for the past few weeks. Patient states that she was nauseous yesterday and had a choking feeling in her throat. Patient denies having any CP, arm, jaw or neck pain, excessive sweating, lightheadedness, dizziness, syncope, palpitations, or any other symptoms. Patient states that she is SOB on exertion but this is chronic and has not changed at all. At the time of her MI, she had excessive sweatiness and nausea along with tooth pain. She was lightheaded and felt that she would pass out. She got home and had left arm, left jaw and chest pressure. Patient is asymptomatic at this time. BP 115/78 HR 72. Patient is compliant with medicines. Patient requesting a sooner appointment. Appointment made with Lyda Jester, PA on 08/28/17 at 11:30 AM. Patient does not have any NTG left. Rx sent to preferred pharmacy. ER precautions reviewed. Patient verbalized understanding and thanked me for the call.

## 2017-08-06 ENCOUNTER — Other Ambulatory Visit (INDEPENDENT_AMBULATORY_CARE_PROVIDER_SITE_OTHER): Payer: Self-pay | Admitting: Family Medicine

## 2017-08-06 DIAGNOSIS — R7303 Prediabetes: Secondary | ICD-10-CM

## 2017-08-07 ENCOUNTER — Telehealth: Payer: Self-pay

## 2017-08-07 ENCOUNTER — Other Ambulatory Visit: Payer: Self-pay

## 2017-08-07 ENCOUNTER — Emergency Department: Payer: BLUE CROSS/BLUE SHIELD

## 2017-08-07 ENCOUNTER — Encounter: Payer: Self-pay | Admitting: Emergency Medicine

## 2017-08-07 ENCOUNTER — Observation Stay
Admission: EM | Admit: 2017-08-07 | Discharge: 2017-08-07 | Disposition: A | Discharge status: Home / Self Care | Payer: BLUE CROSS/BLUE SHIELD | Attending: Internal Medicine | Admitting: Internal Medicine

## 2017-08-07 DIAGNOSIS — E039 Hypothyroidism, unspecified: Secondary | ICD-10-CM | POA: Insufficient documentation

## 2017-08-07 DIAGNOSIS — E785 Hyperlipidemia, unspecified: Secondary | ICD-10-CM | POA: Insufficient documentation

## 2017-08-07 DIAGNOSIS — F329 Major depressive disorder, single episode, unspecified: Secondary | ICD-10-CM | POA: Diagnosis not present

## 2017-08-07 DIAGNOSIS — R079 Chest pain, unspecified: Principal | ICD-10-CM | POA: Insufficient documentation

## 2017-08-07 DIAGNOSIS — Z7902 Long term (current) use of antithrombotics/antiplatelets: Secondary | ICD-10-CM | POA: Insufficient documentation

## 2017-08-07 DIAGNOSIS — I2510 Atherosclerotic heart disease of native coronary artery without angina pectoris: Secondary | ICD-10-CM | POA: Insufficient documentation

## 2017-08-07 DIAGNOSIS — R0789 Other chest pain: Secondary | ICD-10-CM | POA: Diagnosis not present

## 2017-08-07 DIAGNOSIS — F419 Anxiety disorder, unspecified: Secondary | ICD-10-CM | POA: Diagnosis not present

## 2017-08-07 DIAGNOSIS — Z79899 Other long term (current) drug therapy: Secondary | ICD-10-CM | POA: Insufficient documentation

## 2017-08-07 DIAGNOSIS — Z6841 Body Mass Index (BMI) 40.0 and over, adult: Secondary | ICD-10-CM | POA: Insufficient documentation

## 2017-08-07 DIAGNOSIS — I252 Old myocardial infarction: Secondary | ICD-10-CM | POA: Diagnosis not present

## 2017-08-07 DIAGNOSIS — Z8249 Family history of ischemic heart disease and other diseases of the circulatory system: Secondary | ICD-10-CM | POA: Insufficient documentation

## 2017-08-07 DIAGNOSIS — I10 Essential (primary) hypertension: Secondary | ICD-10-CM | POA: Diagnosis not present

## 2017-08-07 DIAGNOSIS — Z87891 Personal history of nicotine dependence: Secondary | ICD-10-CM | POA: Insufficient documentation

## 2017-08-07 LAB — BASIC METABOLIC PANEL
Anion gap: 7 (ref 5–15)
BUN: 13 mg/dL (ref 6–20)
CO2: 25 mmol/L (ref 22–32)
Calcium: 8.8 mg/dL — ABNORMAL LOW (ref 8.9–10.3)
Chloride: 106 mmol/L (ref 98–111)
Creatinine, Ser: 0.65 mg/dL (ref 0.44–1.00)
GFR calc Af Amer: >60 mL/min (ref >60)
GLUCOSE: 92 mg/dL (ref 70–99)
POTASSIUM: 3.6 mmol/L (ref 3.5–5.1)
Sodium: 138 mmol/L (ref 135–145)

## 2017-08-07 LAB — TROPONIN I
Troponin I: <0.03 ng/mL (ref <0.03)
Troponin I: <0.03 ng/mL (ref <0.03)

## 2017-08-07 LAB — CBC
HEMATOCRIT: 38.3 % (ref 35.0–47.0)
Hemoglobin: 13.4 g/dL (ref 12.0–16.0)
MCH: 29.1 pg (ref 26.0–34.0)
MCHC: 35.1 g/dL (ref 32.0–36.0)
MCV: 82.9 fL (ref 80.0–100.0)
Platelets: 240 10*3/uL (ref 150–440)
RBC: 4.62 MIL/uL (ref 3.80–5.20)
RDW: 14.1 % (ref 11.5–14.5)
WBC: 11.6 10*3/uL — ABNORMAL HIGH (ref 3.6–11.0)

## 2017-08-07 LAB — PROTIME-INR
INR: 0.93
PROTHROMBIN TIME: 12.4 s (ref 11.4–15.2)

## 2017-08-07 MED ORDER — ASPIRIN 81 MG PO CHEW
162.0000 mg | CHEWABLE_TABLET | Freq: Once | ORAL | Status: AC
  Administered 2017-08-07: 162 mg via ORAL
  Filled 2017-08-07: qty 2

## 2017-08-07 NOTE — Discharge Instructions (Addendum)
We have offered you admission the hospital you prefer to go home.  This is certainly your choice but it does limit our ability to take care of you.  If you have any new or worrisome symptoms or change your mind about admission please return to the emergency department.  Otherwise, please follow-up without fail with your cardiologist first thing in the morning tomorrow, as well as with primary care.  Concerning symptoms would \\include  but are not limited to chest pain, shortness of breath, nausea vomiting or other symptoms.

## 2017-08-07 NOTE — Telephone Encounter (Signed)
The pt walked in the office requesting a EKG because she was not feeling well. She complained of numbness in her lip and heaviness in her chest. The pt was walking and alert with her husband. I informed the patient that she needs to go to the ER, because they can do stat labs to rule out nothing cardiac is going on and a EKG. She verbalize understanding and agreed to have her husband that was with her take her th the ER.

## 2017-08-07 NOTE — ED Notes (Signed)
Rebecca RN aware of bed assigned 

## 2017-08-07 NOTE — ED Triage Notes (Addendum)
Pt to ED via POV with c/o CP, hx of MI. VSS. PT took 325mg  Asprin PTA.  NAD noted, VSS. PT ambulatory.

## 2017-08-07 NOTE — ED Provider Notes (Addendum)
[facility] Emergency Department Provider Note  ____________________________________________   I have reviewed the triage vital signs and the nursing notes. Where available I have reviewed prior notes and, if possible and indicated, outside hospital notes.    HISTORY  Chief Complaint Chest Pain    HPI Sheri [name] is a 52 y.o. female  With a history of CAD with a MI in 2016, followed by [facility] cardiology, states that when she had a heart attack in 2016 she did not have any Chest pain but she had other vague symptoms like nausea and tingling in her arm etc.  Today, she states she was at rest and began having tingling around her lips and in her left arm at her fingertips only, this was followed by some chest pressure which lasted at rest for some hours and went away after taking aspirin.  She is not having any symptoms at this time.  She called her cardiologist and was told to come to the emergency room for admission.  She was also given a prescription for nitroglycerin which she did not take.  Patient states that she has no radiation of the pain, it seemed to get better after aspirin although she cannot see if it is because of the aspirin.  She denies any fever or chills.  She has had a very slight cough.  She denies any nausea at this time although she had some mild nausea earlier.  Patient does have history of anxiety, she does not feel she is having a panic attack.  Describes as a mild pressure.  She also states that she is feeling which she feels her early beats but on the monitor she is not identifying any.   Past Medical History:  Diagnosis Date  . Abnormal uterine bleeding   . Anomaly heart   . Anxiety   . Coronary artery disease   . Depression   . Dyspnea    with exertion  . Dysrhythmia   . Heart attack ([place]) 03/2014  . Heart disease   . History of C-section   . History of kidney stones   . HLD (hyperlipidemia)   . Hx of heart artery stent   .  Hypertension   . Hypothyroidism     Patient Active Problem List   Diagnosis Date Noted  . Prediabetes 07/04/2017  . Essential hypertension 07/04/2017  . Status post laparoscopic hysterectomy 10/10/2016  . Vitamin D deficiency 09/21/2016  . Chronic fatigue 09/21/2016  . Acquired hypothyroidism 09/21/2016  . Menorrhagia with irregular cycle 08/29/2016  . Hypertension 06/19/2016  . Old MI (myocardial infarction) 05/03/2016  . Nocturnal leg cramps 12/30/2015  . Right nephrolithiasis 12/01/2015  . Pulmonary nodule 12/01/2015  . Anxiety and depression 06/21/2015  . Plantar fasciitis, bilateral 06/21/2015  . Gross hematuria 10/04/2014  . Urinary frequency 10/04/2014  . Morbid obesity with BMI of 40.0-44.9, adult ([employer]) 07/06/2014  . Coronary arteriosclerosis 04/21/2014  . Hyperlipidemia 04/21/2014  . History of ST elevation myocardial infarction (STEMI) 04/06/2014    Past Surgical History:  Procedure Laterality Date  . CARDIAC CATHETERIZATION    . CARDIAC SURGERY    . [place]  . CHOLECYSTECTOMY    . CORONARY ANGIOPLASTY    . CYSTOSCOPY  10/10/2016   Procedure: CYSTOSCOPY;  Surgeon: Will Bonnet, MD;  Location: [facility] ORS;  Service: Gynecology;;  . Consuela Mimes WITH STENT PLACEMENT Left 11/01/2015   Procedure: CYSTOSCOPY WITH STENT PLACEMENT;  Surgeon: Hollice Espy,  MD;  Location: [facility] ORS;  Service: Urology;  Laterality: Left;  . CYSTOSCOPY/RETROGRADE/URETEROSCOPY Bilateral 11/01/2015   Procedure: CYSTOSCOPY/RETROGRADE/URETEROSCOPY;  Surgeon: Hollice Espy, MD;  Location: [facility] ORS;  Service: Urology;  Laterality: Bilateral;  . DILATION AND CURETTAGE OF UTERUS  2011  . ENDOMETRIAL ABLATION  2011  . LAPAROSCOPIC HYSTERECTOMY Bilateral 10/10/2016   Procedure: HYSTERECTOMY TOTAL LAPAROSCOPIC BILATERAL SALPINGECTOMY;  Surgeon: Will Bonnet, MD;  Location: [facility] ORS;  Service: Gynecology;  Laterality: Bilateral;  . LEFT HEART CATHETERIZATION WITH  CORONARY ANGIOGRAM N/A 04/06/2014   Procedure: LEFT HEART CATHETERIZATION WITH CORONARY ANGIOGRAM;  Surgeon: Jettie Booze, MD;  Location: [facility] CATH LAB;  Service: Cardiovascular;  Laterality: N/A;    Prior to Admission medications   Medication Sig Start Date End Date Taking? Authorizing Provider  Chromium 200 MCG CAPS Take by mouth. 01/02/17   Karamalegos, Devonne Doughty, DO  clopidogrel (PLAVIX) 75 MG tablet TAKE 1 TABLET BY MOUTH EVERY DAY 07/16/17   Jettie Booze, MD  escitalopram (LEXAPRO) 20 MG tablet Take 1 tablet (20 mg total) by mouth daily. 05/30/17   Karamalegos, Devonne Doughty, DO  isosorbide mononitrate (IMDUR) 30 MG 24 hr tablet Take 1 tablet (30 mg total) by mouth daily. Please make yearly appt with Dr. Irish Lack for August before anymore refills. 1st attempt 06/01/17   Jettie Booze, MD  levothyroxine (SYNTHROID, LEVOTHROID) 25 MCG tablet TAKE 1 TABLET (25 MCG TOTAL) BY MOUTH DAILY BEFORE BREAKFAST. 03/19/17   Karamalegos, Devonne Doughty, DO  lisinopril (PRINIVIL,ZESTRIL) 5 MG tablet TAKE 1 TABLET BY MOUTH EVERY DAY 07/16/17   Jettie Booze, MD  Melatonin 1 MG CAPS Take by mouth. 01/02/17   Karamalegos, Devonne Doughty, DO  metFORMIN (GLUCOPHAGE) 500 MG tablet Take 1 tablet (500 mg total) by mouth daily with breakfast. 07/04/17   Eber Jones, MD  metoprolol tartrate (LOPRESSOR) 25 MG tablet Take 1 tablet (25 mg total) by mouth 2 (two) times daily. Patient needs to call and schedule an appointment for further refills 1st attempt 07/30/17   Jettie Booze, MD  nitroGLYCERIN (NITROSTAT) 0.4 MG SL tablet Place 1 tablet (0.4 mg total) under the tongue every 5 (five) minutes x 3 doses as needed for chest pain. 08/01/17   Jettie Booze, MD  ondansetron (ZOFRAN) 4 MG tablet Take 4 mg by mouth every 8 (eight) hours as needed for nausea or vomiting.    [provider]  rosuvastatin (CRESTOR) 20 MG tablet TAKE 1 TABLET DAILY 04/23/17   Jettie Booze, MD   tamsulosin (FLOMAX) 0.4 MG CAPS capsule TAKE 1 CAPSULE BY MOUTH EVERY DAY 06/15/17   Parks Ranger, Devonne Doughty, DO  Vitamin D, Cholecalciferol, 1000 units CAPS Take 2,000 Units by mouth daily.    [provider]    Allergies Latex and Penicillins  Family History  Problem Relation Age of Onset  . Heart attack Father 19       died of MI at age 45  . High blood pressure Father   . High Cholesterol Father   . Heart disease Father   . Sudden death Father   . Hypertension Mother   . Hyperlipidemia Mother   . Hypothyroidism Mother   . Kidney Stones Sister   . Kidney Stones Brother   . Stroke Paternal Grandfather   . Liver cancer Maternal Grandmother   . Kidney disease Neg Hx   . Bladder Cancer Neg Hx     Social History Social History   Tobacco Use  .  Smoking status: Former Smoker    Packs/day: 1.00    Types: Cigarettes  . Smokeless tobacco: Never Used  . Tobacco comment: quit 20 years  Substance Use Topics  . Alcohol use: No    Alcohol/week: 0.0 oz  . Drug use: No    Review of Systems Constitutional: No fever/chills Eyes: No visual changes. ENT: No sore throat. No stiff neck no neck pain Cardiovascular: + chest pain. Respiratory: Denies shortness of breath. Gastrointestinal:   no vomiting.  No diarrhea.  No constipation. Genitourinary: Negative for dysuria. Musculoskeletal: Negative lower extremity swelling Skin: Negative for rash. Neurological: Negative for severe headaches, focal weakness or numbness.   ____________________________________________   PHYSICAL EXAM:  VITAL SIGNS: ED Triage Vitals  Enc Vitals Group     BP 08/07/17 1715 116/68     Pulse Rate 08/07/17 1715 65     Resp 08/07/17 1715 16     Temp 08/07/17 1715 98.2 F (36.8 C)     Temp Source 08/07/17 1715 Oral     SpO2 08/07/17 1715 97 %     Weight 08/07/17 1713 250 lb (113.4 kg)     Height 08/07/17 1713 5\' 5"  (1.651 m)     Head Circumference --      Peak Flow --      Pain Score  08/07/17 1712 5     Pain Loc --      Pain Edu? --      Excl. in [place]? --     Constitutional: Alert and oriented. Well appearing and in no acute distress. Eyes: Conjunctivae are normal Head: Atraumatic HEENT: No congestion/rhinnorhea. Mucous membranes are moist.  Oropharynx non-erythematous Neck:   Nontender with no meningismus, no masses, no stridor Cardiovascular: Normal rate, regular rhythm. Grossly normal heart sounds.  Good peripheral circulation. Respiratory: Normal respiratory effort.  No retractions. Lungs CTAB. Abdominal: Soft and nontender. No distention. No guarding no rebound Back:  There is no focal tenderness or step off.  there is no midline tenderness there are no lesions noted. there is no CVA tenderness Musculoskeletal: No lower extremity tenderness, no upper extremity tenderness. No joint effusions, no DVT signs strong distal pulses no edema Neurologic:  Normal speech and language. No gross focal neurologic deficits are appreciated.  Skin:  Skin is warm, dry and intact. No rash noted. Psychiatric: Mood and affect are mildly anxious. Speech and behavior are normal.  ____________________________________________   LABS (all labs ordered are listed, but only abnormal results are displayed)  Labs Reviewed  BASIC METABOLIC PANEL - Abnormal; Notable for the following components:      Result Value   Calcium 8.8 (*)    All other components within normal limits  CBC - Abnormal; Notable for the following components:   WBC 11.6 (*)    All other components within normal limits  TROPONIN I  PROTIME-INR  TROPONIN I  POC URINE PREG, ED    Pertinent labs  results that were available during my care of the patient were reviewed by me and considered in my medical decision making (see chart for details).  ____________________________________________  EKG  I personally interpreted any EKGs ordered by me or triage Normal sinus rhythm rate 68 bpm, some baseline artifact limits  internal rotation with no acute ST elevation or depression is clearly identified, will repeat Repeat EKG performed at 20 1:01 PM shows no acute ST elevation or depression normal axis, nonspecific ST changes no acute ischemia ____________________________________________  RADIOLOGY  Pertinent labs & imaging  results that were available during my care of the patient were reviewed by me and considered in my medical decision making (see chart for details). If possible, patient and/or family made aware of any abnormal findings.  Dg Chest 2 View  Result Date: 08/07/2017 CLINICAL DATA:  Chest pain. EXAM: CHEST - 2 VIEW COMPARISON:  May 07, 2014 FINDINGS: Heart, hila, mediastinum, lungs, and pleura are unremarkable. IMPRESSION: No active cardiopulmonary disease. Electronically Signed   By: Dorise Bullion III M.D   On: 08/07/2017 17:58   ____________________________________________    PROCEDURES  Procedure(s) performed: None  Procedures  Critical Care performed: None  ____________________________________________   INITIAL IMPRESSION / ASSESSMENT AND PLAN / ED COURSE  Pertinent labs & imaging results that were available during my care of the patient were reviewed by me and considered in my medical decision making (see chart for details).  Patient here with somewhat atypical symptoms of tingling but there is also chest pressure, she is very worried that this is similar to her prior cardiac presentation cardiac enzymes are negative despite pain that began at 3 PM we will repeat cardiac enzymes we will supplement the 2 baby aspirin she Artie took to give her a total of 4 today, do not think heparin is indicated but given her significant cardiac history and atypical presentation with last MI we will admit.  ----------------------------------------- 10:51 PM on 08/07/2017 -----------------------------------------  Patient's second troponin is negative, repeat EKG is reassuring, nonetheless we  had the patient up for admission at this time however after talking to the hospitalist Dr. Bjorn Loser, the patient declines admission.  She states she believes she was just having anxiety.  She refuses admission.  I then went into talk to the patient again.  Repeat troponin is negative.  Patient adamantly declines admission.  She states she was just anxious.  She does not want to stay.  She understands the risk of going home including death.  I personally low suspicion that this was ACS encouraged by her negative troponins but given her history and her heart score, I did not feel that I wanted to send her home but if she is adamant about going home obviously we will stop her.  She states that she feels where she will come back she understands all of the risks.  We will therefore discharge her at her own request.  She will see her cardiac first thing in the morning.  She has no symptoms this is been here 2- sets of troponins despite symptoms that started this morning, we will discharge her at her personal request.  She had already has orders for admission and when she is asked [place] to discontinue all of them which we will do.  Patient I think has the capacity to make these decisions and understands the risk.  Shared decision-making was undertaken with the patient and given that she is refusing admission we will discharge.   ____________________________________________   FINAL CLINICAL IMPRESSION(S) / ED DIAGNOSES  Final diagnoses:  Chest pain, unspecified type      This chart was dictated using voice recognition software.  Despite best efforts to proofread,  errors can occur which can change meaning.      Schuyler Amor, MD 08/07/17 2102    Schuyler Amor, MD 08/07/17 2129    Schuyler Amor, MD 08/07/17 [phone]

## 2017-08-07 NOTE — Telephone Encounter (Signed)
I was notified after patient has already left office and she was not evaluated clinically at our office today. However, I agree with this triage, patient with these complaints and known cardiac history should present directly to hospital ED  [name], [facility] 08/07/2017, 5:21 PM

## 2017-08-08 ENCOUNTER — Encounter (INDEPENDENT_AMBULATORY_CARE_PROVIDER_SITE_OTHER): Payer: Self-pay

## 2017-08-09 ENCOUNTER — Encounter: Payer: Self-pay | Admitting: Cardiology

## 2017-08-28 ENCOUNTER — Encounter: Payer: Self-pay | Admitting: Cardiology

## 2017-08-28 ENCOUNTER — Ambulatory Visit: Payer: BLUE CROSS/BLUE SHIELD | Admitting: Cardiology

## 2017-08-28 VITALS — BP 118/76 | HR 67 | Ht 65.0 in | Wt 260.0 lb

## 2017-08-28 DIAGNOSIS — I252 Old myocardial infarction: Secondary | ICD-10-CM

## 2017-08-28 MED ORDER — ISOSORBIDE MONONITRATE ER 30 MG PO TB24: 30.0000 mg | ORAL_TABLET | Freq: Every day | ORAL | 3 refills | Status: DC

## 2017-08-28 NOTE — Progress Notes (Signed)
08/28/2017 Sheri [name]   1965-10-11  [id number]  Primary Physician Parks Ranger Devonne Doughty, DO Primary Cardiologist: Dr. Irish Lack   Reason for Visit/CC: Post ED F/u; h/o CAD  HPI:  Sheri [name] is a 52 y.o. female who is being seen today for post ED f/u. She was seen at [facility] ED on 08/07/17 with complaints of periorbital tingling + tingling of her hands, dyspnea and palpitations. She also noted some mild chest discomfort. W/u in the ED was negative. EKG was nonischemic and troponin's were negative x 2. CXR unremarkable. K WNL but Ca was mildly low at 8.8. CBC normal. The EDP felt that her symptoms were possibly due to anxiety. She was offered admission for overnight observation but declined.   In summary, she has a known history of CAD. In March 2016,she was admitted for an inferior MI. Left heart catheterization showed culprit vessel to be anoccluded RCA for which she underwent PCI plus stenting. 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 This was treated medically. The left main coronary artery is absent. 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 She was placed on dual antiplatelet therapy. Several months later, she was seen by Dr. Nira Conn October 2016 for exertional dyspnea. There was question as to whether or not this was her anginal equivalent,secondary to her circumflex disease. Subsequently Dr. Ludger Nutting for her to undergo a nuclear stress test to assess for underlying ischemia. This was a low risk study. There was asmall, mild intensity lateral fixed perfusion defect which was felt to be attenuation artifact. There was no significant reversible ischemia. LVEF was 64% with normal wall motion. 2D echo also confirmed normal LVEF and BNP was also normal. Additionalpast medical history also includes hypertension and hyperlipidemia for which she is being treated  for.  Since her ED visit, she denies any recurrent symptoms. Her above symptoms that she experienced the day she went to the ED have resolved. She still has exertional dyspnea with moderate activity, which she reports is chronic. No change from her usual baseline. She attributes this to her obesity. She is planning on visiting a weight loss specialist to consider weight loss surgery. She denies any exertional CP and no recurrent palpitations. BP is well controlled and EKG shows NSR. No ischemic abnormalities.    Current Meds  Medication Sig  . Chromium 200 MCG CAPS Take by mouth.  . clopidogrel (PLAVIX) 75 MG tablet TAKE 1 TABLET BY MOUTH EVERY DAY  . escitalopram (LEXAPRO) 20 MG tablet Take 1 tablet (20 mg total) by mouth daily.  . isosorbide mononitrate (IMDUR) 30 MG 24 hr tablet Take 1 tablet (30 mg total) by mouth daily. Please make yearly appt with Dr. Irish Lack for August before anymore refills. 1st attempt  . levothyroxine (SYNTHROID, LEVOTHROID) 25 MCG tablet TAKE 1 TABLET (25 MCG TOTAL) BY MOUTH DAILY BEFORE BREAKFAST.  Marland Kitchen lisinopril (PRINIVIL,ZESTRIL) 5 MG tablet TAKE 1 TABLET BY MOUTH EVERY DAY  . Melatonin 1 MG CAPS Take by mouth.  . metFORMIN (GLUCOPHAGE) 500 MG tablet Take 1 tablet (500 mg total) by mouth daily with breakfast.  . metoprolol tartrate (LOPRESSOR) 25 MG tablet Take 1 tablet (25 mg total) by mouth 2 (two) times daily. Patient needs to call and schedule an appointment for further refills 1st attempt  . nitroGLYCERIN (NITROSTAT) 0.4 MG SL tablet Place 1 tablet (0.4 mg total) under the tongue every 5 (  five) minutes x 3 doses as needed for chest pain.  Marland Kitchen ondansetron (ZOFRAN) 4 MG tablet Take 4 mg by mouth every 8 (eight) hours as needed for nausea or vomiting.  . rosuvastatin (CRESTOR) 20 MG tablet TAKE 1 TABLET DAILY  . tamsulosin (FLOMAX) 0.4 MG CAPS capsule TAKE 1 CAPSULE BY MOUTH EVERY DAY  . Vitamin D, Cholecalciferol, 1000 units CAPS Take 2,000 Units by mouth daily.    Allergies  Allergen Reactions  . Latex Dermatitis  . Penicillins Rash    Has patient had a PCN reaction causing immediate rash, facial/tongue/throat swelling, SOB or lightheadedness with hypotension: Yes Has patient had a PCN reaction causing severe rash involving mucus membranes or skin necrosis: Unknown Has patient had a PCN reaction that required hospitalization: No Has patient had a PCN reaction occurring within the last 10 years: No If all of the above answers are "NO", then may proceed with Cephalosporin use.    Past Medical History:  Diagnosis Date  . Abnormal uterine bleeding   . Anomaly heart   . Anxiety   . Coronary artery disease   . Depression   . Dyspnea    with exertion  . Dysrhythmia   . Heart attack ([place]) 03/2014  . Heart disease   . History of C-section   . History of kidney stones   . HLD (hyperlipidemia)   . Hx of heart artery stent   . Hypertension   . Hypothyroidism    Family History  Problem Relation Age of Onset  . Heart attack Father 11       died of MI at age 77  . High blood pressure Father   . High Cholesterol Father   . Heart disease Father   . Sudden death Father   . Hypertension Mother   . Hyperlipidemia Mother   . Hypothyroidism Mother   . Kidney Stones Sister   . Kidney Stones Brother   . Stroke Paternal Grandfather   . Liver cancer Maternal Grandmother   . Kidney disease Neg Hx   . Bladder Cancer Neg Hx    Past Surgical History:  Procedure Laterality Date  . CARDIAC CATHETERIZATION    . CARDIAC SURGERY    . [place]  . CHOLECYSTECTOMY    . CORONARY ANGIOPLASTY    . CYSTOSCOPY  10/10/2016   Procedure: CYSTOSCOPY;  Surgeon: Will Bonnet, MD;  Location: [facility] ORS;  Service: Gynecology;;  . Consuela Mimes WITH STENT PLACEMENT Left 11/01/2015   Procedure: CYSTOSCOPY WITH STENT PLACEMENT;  Surgeon: Hollice Espy, MD;  Location: [facility] ORS;  Service: Urology;  Laterality: Left;  .  CYSTOSCOPY/RETROGRADE/URETEROSCOPY Bilateral 11/01/2015   Procedure: CYSTOSCOPY/RETROGRADE/URETEROSCOPY;  Surgeon: Hollice Espy, MD;  Location: [facility] ORS;  Service: Urology;  Laterality: Bilateral;  . DILATION AND CURETTAGE OF UTERUS  2011  . ENDOMETRIAL ABLATION  2011  . LAPAROSCOPIC HYSTERECTOMY Bilateral 10/10/2016   Procedure: HYSTERECTOMY TOTAL LAPAROSCOPIC BILATERAL SALPINGECTOMY;  Surgeon: Will Bonnet, MD;  Location: [facility] ORS;  Service: Gynecology;  Laterality: Bilateral;  . LEFT HEART CATHETERIZATION WITH CORONARY ANGIOGRAM N/A 04/06/2014   Procedure: LEFT HEART CATHETERIZATION WITH CORONARY ANGIOGRAM;  Surgeon: Jettie Booze, MD;  Location: [facility] CATH LAB;  Service: Cardiovascular;  Laterality: N/A;   Social History   Socioeconomic History  . Marital status: Married    Spouse name: Dealie Koelzer   . Number of children: 3  . Years of education: Not on file  . Highest education level: Not  on file  Occupational History  . 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 Financial resource strain: Not on file  . Food insecurity:    Worry: Not on file    Inability: Not on file  . Transportation needs:    Medical: Not on file    Non-medical: Not on file  Tobacco Use  . Smoking status: Former Smoker    Packs/day: 1.00    Types: Cigarettes  . Smokeless tobacco: Never Used  . Tobacco comment: quit 20 years  Substance and Sexual Activity  . Alcohol use: No    Alcohol/week: 0.0 oz  . Drug use: No  . Sexual activity: Yes    Birth control/protection: None  Lifestyle  . Physical activity:    Days per week: Not on file    Minutes per session: Not on file  . Stress: Not on file  Relationships  . Social connections:    Talks on phone: Not on file    Gets together: Not on file    Attends religious service: Not on file    Active member of club or organization: Not on file    Attends meetings of clubs or organizations: Not on file    Relationship status: Not on file  .  Intimate partner violence:    Fear of current or ex partner: Not on file    Emotionally abused: Not on file    Physically abused: Not on file    Forced sexual activity: Not on file  Other Topics Concern  . Not on file  Social History Narrative  . Not on file     Review of Systems: General: negative for chills, fever, night sweats or weight changes.  Cardiovascular: negative for chest pain, dyspnea on exertion, edema, orthopnea, palpitations, paroxysmal nocturnal dyspnea or shortness of breath Dermatological: negative for rash Respiratory: negative for cough or wheezing Urologic: negative for hematuria Abdominal: negative for nausea, vomiting, diarrhea, bright red blood per rectum, melena, or hematemesis Neurologic: negative for visual changes, syncope, or dizziness All other systems reviewed and are otherwise negative except as noted above.   Physical Exam:  Blood pressure 118/76, pulse 67, height 5\' 5"  (1.651 m), weight 260 lb (117.9 kg), last menstrual period 09/13/2016, SpO2 96 %.  General appearance: alert, cooperative, no distress and moderately obese Neck: no carotid bruit and no JVD Lungs: clear to auscultation bilaterally Heart: regular rate and rhythm, S1, S2 normal, no murmur, click, rub or gallop Extremities: extremities normal, atraumatic, no cyanosis or edema Pulses: 2+ and symmetric Skin: Skin color, texture, turgor normal. No rashes or lesions Neurologic: Grossly normal  EKG NSR, no ischemic abnormalities -- personally reviewed   ASSESSMENT AND PLAN:   1. 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 NST 10/2014 showed no ischemia. Recent ED evaluation for vague CP was notable for nonischemic serial EKGs and negative troponins. She denies any recurrent symptoms since her ED visit 08/07/17. Per notes, the EDP felt her symptoms were anxiety related. Pt  denies any exertional CP. Her EKG today is nonischemic. I see no need for repeat stress testing at this time. She will plan to keep her f/u with Dr. Irish Lack in October. She will f/u sooner if any recurrence of symptoms c/w unstable angina. For now, continue medical therapy.   2. HTN: controlled on current regimen.   3. DLD: on  statin therapy w/ Crestor.   4. Obesity: Body mass index is 43.27 kg/m.  She is planning on visiting a weight loss specialist to consider weight loss surgery.  Follow-Up: keep scheduled f/u with primary cardiologist in October.   Brennin Durfee Ladoris Gene, MHS [facility] 08/28/2017 11:46 AM

## 2017-08-28 NOTE — Patient Instructions (Signed)
Medication Instructions:   Your physician recommends that you continue on your current medications as directed. Please refer to the Current Medication list given to you today.   If you need a refill on your cardiac medications before your next appointment, please call your pharmacy.  Labwork: NONE ORDERED  TODAY    Testing/Procedures: NONE ORDERED  TODAY   Follow-Up:   AS SCHEDULED   Any Other Special Instructions Will Be Listed Below (If Applicable).                                                                                                                                                   

## 2017-09-15 ENCOUNTER — Other Ambulatory Visit: Payer: Self-pay | Admitting: Family Medicine

## 2017-09-15 DIAGNOSIS — E039 Hypothyroidism, unspecified: Secondary | ICD-10-CM

## 2017-10-04 ENCOUNTER — Other Ambulatory Visit: Payer: BLUE CROSS/BLUE SHIELD

## 2017-10-04 DIAGNOSIS — E559 Vitamin D deficiency, unspecified: Secondary | ICD-10-CM

## 2017-10-04 DIAGNOSIS — I10 Essential (primary) hypertension: Secondary | ICD-10-CM

## 2017-10-04 DIAGNOSIS — E785 Hyperlipidemia, unspecified: Secondary | ICD-10-CM

## 2017-10-04 DIAGNOSIS — Z6841 Body Mass Index (BMI) 40.0 and over, adult: Secondary | ICD-10-CM

## 2017-10-04 DIAGNOSIS — Z0000 Encounter for general adult medical examination without abnormal findings: Secondary | ICD-10-CM

## 2017-10-04 DIAGNOSIS — E039 Hypothyroidism, unspecified: Secondary | ICD-10-CM | POA: Diagnosis not present

## 2017-10-05 LAB — CBC WITH DIFFERENTIAL/PLATELET
Basophils Absolute: 38 cells/uL (ref 0–200)
Basophils Relative: 0.6 %
EOS ABS: 192 {cells}/uL (ref 15–500)
EOS PCT: 3 %
HCT: 38 % (ref 35.0–45.0)
Hemoglobin: 12.6 g/dL (ref 11.7–15.5)
Lymphs Abs: 1926 cells/uL (ref 850–3900)
MCH: 28.2 pg (ref 27.0–33.0)
MCHC: 33.2 g/dL (ref 32.0–36.0)
MCV: 85 fL (ref 80.0–100.0)
MONOS PCT: 6.4 %
MPV: 11.8 fL (ref 7.5–12.5)
Neutro Abs: 3834 cells/uL (ref 1500–7800)
Neutrophils Relative %: 59.9 %
PLATELETS: 224 10*3/uL (ref 140–400)
RBC: 4.47 10*6/uL (ref 3.80–5.10)
RDW: 12.9 % (ref 11.0–15.0)
TOTAL LYMPHOCYTE: 30.1 %
WBC mixed population: 410 cells/uL (ref 200–950)
WBC: 6.4 10*3/uL (ref 3.8–10.8)

## 2017-10-05 LAB — HEMOGLOBIN A1C
EAG (MMOL/L): 6.5 (calc)
Hgb A1c MFr Bld: 5.7 % of total Hgb — ABNORMAL HIGH (ref <5.7)
Mean Plasma Glucose: 117 (calc)

## 2017-10-05 LAB — COMPLETE METABOLIC PANEL WITH GFR
AG RATIO: 1.6 (calc) (ref 1.0–2.5)
ALBUMIN MSPROF: 3.8 g/dL (ref 3.6–5.1)
ALT: 33 U/L — AB (ref 6–29)
AST: 27 U/L (ref 10–35)
Alkaline phosphatase (APISO): 86 U/L (ref 33–130)
BUN: 19 mg/dL (ref 7–25)
CALCIUM: 9.1 mg/dL (ref 8.6–10.4)
CO2: 27 mmol/L (ref 20–32)
Chloride: 104 mmol/L (ref 98–110)
Creat: 0.76 mg/dL (ref 0.50–1.05)
GFR, EST AFRICAN AMERICAN: 104 mL/min/{1.73_m2} (ref >=60)
GFR, EST NON AFRICAN AMERICAN: 90 mL/min/{1.73_m2} (ref >=60)
GLUCOSE: 142 mg/dL — AB (ref 65–139)
Globulin: 2.4 g/dL (calc) (ref 1.9–3.7)
Potassium: 3.9 mmol/L (ref 3.5–5.3)
Sodium: 138 mmol/L (ref 135–146)
TOTAL PROTEIN: 6.2 g/dL (ref 6.1–8.1)
Total Bilirubin: 0.4 mg/dL (ref 0.2–1.2)

## 2017-10-05 LAB — LIPID PANEL
Cholesterol: 101 mg/dL (ref <200)
HDL: 29 mg/dL — AB (ref >50)
LDL Cholesterol (Calc): 53 mg/dL (calc)
Non-HDL Cholesterol (Calc): 72 mg/dL (calc) (ref <130)
TRIGLYCERIDES: 104 mg/dL (ref <150)
Total CHOL/HDL Ratio: 3.5 (calc) (ref <5.0)

## 2017-10-05 LAB — TSH: TSH: 2.36 m[IU]/L

## 2017-10-05 LAB — T4, FREE: FREE T4: 0.8 ng/dL (ref 0.8–1.8)

## 2017-10-05 LAB — VITAMIN D 25 HYDROXY (VIT D DEFICIENCY, FRACTURES): VIT D 25 HYDROXY: 36 ng/mL (ref 30–100)

## 2017-10-09 ENCOUNTER — Encounter: Payer: BLUE CROSS/BLUE SHIELD | Admitting: Family Medicine

## 2017-10-10 ENCOUNTER — Ambulatory Visit (INDEPENDENT_AMBULATORY_CARE_PROVIDER_SITE_OTHER): Payer: BLUE CROSS/BLUE SHIELD | Admitting: Family Medicine

## 2017-10-10 ENCOUNTER — Encounter: Payer: Self-pay | Admitting: Family Medicine

## 2017-10-10 ENCOUNTER — Other Ambulatory Visit (HOSPITAL_COMMUNITY): Payer: Self-pay | Admitting: Surgery

## 2017-10-10 VITALS — BP 105/74 | HR 62 | Temp 98.5°F | Resp 16 | Ht 65.0 in | Wt 262.0 lb

## 2017-10-10 DIAGNOSIS — R7303 Prediabetes: Secondary | ICD-10-CM

## 2017-10-10 DIAGNOSIS — Z6841 Body Mass Index (BMI) 40.0 and over, adult: Secondary | ICD-10-CM

## 2017-10-10 DIAGNOSIS — I10 Essential (primary) hypertension: Secondary | ICD-10-CM

## 2017-10-10 DIAGNOSIS — Z0000 Encounter for general adult medical examination without abnormal findings: Secondary | ICD-10-CM | POA: Diagnosis not present

## 2017-10-10 DIAGNOSIS — Z23 Encounter for immunization: Secondary | ICD-10-CM

## 2017-10-10 DIAGNOSIS — E782 Mixed hyperlipidemia: Secondary | ICD-10-CM

## 2017-10-10 DIAGNOSIS — E559 Vitamin D deficiency, unspecified: Secondary | ICD-10-CM

## 2017-10-10 DIAGNOSIS — E039 Hypothyroidism, unspecified: Secondary | ICD-10-CM

## 2017-10-10 MED ORDER — METFORMIN HCL 500 MG PO TABS: 500.0000 mg | ORAL_TABLET | Freq: Every day | ORAL | 1 refills | Status: DC

## 2017-10-10 NOTE — Progress Notes (Signed)
Subjective:    Patient ID: Sheri [name], female    DOB: 02/16/1965, 52 y.o.   MRN: [id number]  Sheri [name] is a 52 y.o. female presenting on 10/10/2017 for Annual Exam   HPI   Here for Annual Physical and Lab Review.  Morbid Obesity BMI >43 / Pre-Diabetes Followed by [facility] previously, limited results. She was given Metformin 500mg  daily low dose back in 04/2017, tried then stopped this. Instead referred to Bariatric, initial preliminary testing pending.  Due for metformin refill, she would like to restart this Last A1c now 5.7, prior result was 5.8 Lifestyle - Diet: Improved low carb diet, limited portions, followed prior wt loss strategies - Exercise: Increasing exercise as tolerated  Hypothyroidism Recent lab shows TSH and Free T4 normal. Continues to take Levothyroxine 38mcg daily before breakfast and other meds, does well on med She admits occasional episodes rarely of feeling "neck fullness" and swelling and quick episode and fatigue and tired and rests for 10 min then feels good and is back to normal.  HYPERLIPIDEMIA: Last lipid panel 09/2017, mostly controlled except low HDL - Currently taking Rosuvastatin 20mg  daily, tolerating well without side effects or myalgias  Vitamin D Improved. On VitD3 2k daily  CHRONIC HTN: Current Meds - Metoprolol 25mg  BID, lisinopril 5mg  daily   Reports good compliance, took meds today. Tolerating well, w/o complaints.  Additional updates: - Recent atypical chest pain ED Visit back 2 months ago, reviewed and has seen her Cardiologist, ruled out MI, thought to be anxiety provoked, not anginal symptoms, despite her known CAD. On Imdur and Plavix.  Health Maintenance: Due for Flu Shot, will receive today   Overdue for breast cancer screening, mammogram. Previously done at [facility], she did not schedule this after last visit. She may agree to go to [facility] instead. Will let us know when ready for order.  UTD  Pap. She is s/p hysterectomy.  Depression screen [facility] 2/9 10/10/2017 05/14/2017 04/02/2017  Decreased Interest 0 3 0  Down, Depressed, Hopeless 0 1 0  PHQ - 2 Score 0 4 0  Altered sleeping 0 1 0  Tired, decreased energy 0 3 2  Change in appetite 0 2 2  Feeling bad or failure about yourself  0 3 0  Trouble concentrating 0 3 0  Moving slowly or fidgety/restless 0 1 0  Suicidal thoughts 0 0 0  PHQ-9 Score 0 17 4  Difficult doing work/chores Not difficult at all Extremely dIfficult Not difficult at all  Some recent data might be hidden   GAD 7 : Generalized Anxiety Score 10/10/2017 01/02/2017 11/07/2016 06/19/2016  Nervous, Anxious, on Edge 0 1 3 3   Control/stop worrying 0 1 3 0  Worry too much - different things 0 1 3 0  Trouble relaxing 0 1 3 1   Restless 0 0 0 0  Easily annoyed or irritable 0 0 0 0  Afraid - awful might happen 0 1 3 0  Total GAD 7 Score 0 5 15 4   Anxiety Difficulty Not difficult at all Not difficult at all Not difficult at all Not difficult at all    Past Medical History:  Diagnosis Date  . Abnormal uterine bleeding   . Anomaly heart   . Anxiety   . Coronary artery disease   . Dyspnea    with exertion  . Dysrhythmia   . Heart attack ([place]) 03/2014  . Heart disease   . History of C-section   .  History of kidney stones   . Hx of heart artery stent   . Hypertension    Past Surgical History:  Procedure Laterality Date  . CARDIAC CATHETERIZATION    . CARDIAC SURGERY    . [place]  . CHOLECYSTECTOMY    . CORONARY ANGIOPLASTY    . CYSTOSCOPY  10/10/2016   Procedure: CYSTOSCOPY;  Surgeon: Will Bonnet, MD;  Location: [facility] ORS;  Service: Gynecology;;  . Consuela Mimes WITH STENT PLACEMENT Left 11/01/2015   Procedure: CYSTOSCOPY WITH STENT PLACEMENT;  Surgeon: Hollice Espy, MD;  Location: [facility] ORS;  Service: Urology;  Laterality: Left;  . CYSTOSCOPY/RETROGRADE/URETEROSCOPY Bilateral 11/01/2015   Procedure:  CYSTOSCOPY/RETROGRADE/URETEROSCOPY;  Surgeon: Hollice Espy, MD;  Location: [facility] ORS;  Service: Urology;  Laterality: Bilateral;  . DILATION AND CURETTAGE OF UTERUS  2011  . ENDOMETRIAL ABLATION  2011  . LAPAROSCOPIC HYSTERECTOMY Bilateral 10/10/2016   Procedure: HYSTERECTOMY TOTAL LAPAROSCOPIC BILATERAL SALPINGECTOMY;  Surgeon: Will Bonnet, MD;  Location: [facility] ORS;  Service: Gynecology;  Laterality: Bilateral;  . LEFT HEART CATHETERIZATION WITH CORONARY ANGIOGRAM N/A 04/06/2014   Procedure: LEFT HEART CATHETERIZATION WITH CORONARY ANGIOGRAM;  Surgeon: Jettie Booze, MD;  Location: [facility] CATH LAB;  Service: Cardiovascular;  Laterality: N/A;   Social History   Socioeconomic History  . Marital status: Married    Spouse name: Marielena Harvell   . Number of children: 3  . Years of education: Not on file  . Highest education level: Not on file  Occupational History  . 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 Financial resource strain: Not on file  . Food insecurity:    Worry: Not on file    Inability: Not on file  . Transportation needs:    Medical: Not on file    Non-medical: Not on file  Tobacco Use  . Smoking status: Former Smoker    Packs/day: 1.00    Types: Cigarettes  . Smokeless tobacco: Former Systems developer  . Tobacco comment: quit 20 years  Substance and Sexual Activity  . Alcohol use: No    Alcohol/week: 0.0 standard drinks  . Drug use: No  . Sexual activity: Yes    Birth control/protection: None  Lifestyle  . Physical activity:    Days per week: Not on file    Minutes per session: Not on file  . Stress: Not on file  Relationships  . Social connections:    Talks on phone: Not on file    Gets together: Not on file    Attends religious service: Not on file    Active member of club or organization: Not on file    Attends meetings of clubs or organizations: Not on file    Relationship status: Not on file  . Intimate partner violence:    Fear of current or ex  partner: Not on file    Emotionally abused: Not on file    Physically abused: Not on file    Forced sexual activity: Not on file  Other Topics Concern  . Not on file  Social History Narrative  . Not on file   Family History  Problem Relation Age of Onset  . Heart attack Father 48       died of MI at age 47  . High blood pressure Father   . High Cholesterol Father   . Heart disease Father   . Sudden death Father   . Hypertension Mother   . Hyperlipidemia  Mother   . Hypothyroidism Mother   . Kidney Stones Sister   . Kidney Stones Brother   . Stroke Paternal Grandfather   . Liver cancer Maternal Grandmother   . Kidney disease Neg Hx   . Bladder Cancer Neg Hx    Current Outpatient Medications on File Prior to Visit  Medication Sig  . Chromium 200 MCG CAPS Take by mouth.  . clopidogrel (PLAVIX) 75 MG tablet TAKE 1 TABLET BY MOUTH EVERY DAY  . escitalopram (LEXAPRO) 20 MG tablet Take 1 tablet (20 mg total) by mouth daily.  . isosorbide mononitrate (IMDUR) 30 MG 24 hr tablet Take 1 tablet (30 mg total) by mouth daily.  Marland Kitchen levothyroxine (SYNTHROID, LEVOTHROID) 25 MCG tablet TAKE 1 TABLET (25 MCG TOTAL) BY MOUTH DAILY BEFORE BREAKFAST.  Marland Kitchen lisinopril (PRINIVIL,ZESTRIL) 5 MG tablet TAKE 1 TABLET BY MOUTH EVERY DAY  . Melatonin 1 MG CAPS Take by mouth.  . metoprolol tartrate (LOPRESSOR) 25 MG tablet Take 1 tablet (25 mg total) by mouth 2 (two) times daily. Patient needs to call and schedule an appointment for further refills 1st attempt  . nitroGLYCERIN (NITROSTAT) 0.4 MG SL tablet Place 1 tablet (0.4 mg total) under the tongue every 5 (five) minutes x 3 doses as needed for chest pain.  Marland Kitchen ondansetron (ZOFRAN) 4 MG tablet Take 4 mg by mouth every 8 (eight) hours as needed for nausea or vomiting.  . rosuvastatin (CRESTOR) 20 MG tablet TAKE 1 TABLET DAILY  . tamsulosin (FLOMAX) 0.4 MG CAPS capsule TAKE 1 CAPSULE BY MOUTH EVERY DAY  . Vitamin D, Cholecalciferol, 1000 units CAPS Take 2,000  Units by mouth daily.   No current facility-administered medications on file prior to visit.     Review of Systems  Constitutional: Negative for activity change, appetite change, chills, diaphoresis, fatigue and fever.  HENT: Negative for congestion and hearing loss.   Eyes: Negative for visual disturbance.  Respiratory: Negative for apnea, cough, choking, chest tightness, shortness of breath and wheezing.   Cardiovascular: Negative for chest pain, palpitations and leg swelling.  Gastrointestinal: Negative for abdominal pain, constipation, diarrhea, nausea and vomiting.  Endocrine: Negative for cold intolerance.  Genitourinary: Negative for difficulty urinating, dysuria, frequency and hematuria.  Musculoskeletal: Negative for arthralgias and neck pain.  Skin: Negative for rash.  Allergic/Immunologic: Negative for environmental allergies.  Neurological: Negative for dizziness, weakness, light-headedness, numbness and headaches.  Hematological: Negative for adenopathy.  Psychiatric/Behavioral: Negative for behavioral problems, dysphoric mood and sleep disturbance.   Per HPI unless specifically indicated above     Objective:    BP 105/74   Pulse 62   Temp 98.5 F (36.9 C) (Oral)   Resp 16   Ht 5\' 5"  (1.651 m)   Wt 262 lb (118.8 kg)   LMP 09/13/2016   BMI 43.60 kg/m   Wt Readings from Last 3 Encounters:  10/10/17 262 lb (118.8 kg)  08/28/17 260 lb (117.9 kg)  08/07/17 250 lb (113.4 kg)    Physical Exam  Constitutional: She is oriented to person, place, and time. She appears well-developed and well-nourished. No distress.  Well-appearing, comfortable, cooperative, morbidly obese  HENT:  Head: Normocephalic and atraumatic.  Mouth/Throat: Oropharynx is clear and moist.  Eyes: Pupils are equal, round, and reactive to light. Conjunctivae and EOM are normal. Right eye exhibits no discharge. Left eye exhibits no discharge.  Neck: Normal range of motion. Neck supple. No  thyromegaly present.  No thyroid nodules or abnormality  Cardiovascular: Normal rate, regular  rhythm, normal heart sounds and intact distal pulses.  No murmur heard. Pulmonary/Chest: Effort normal and breath sounds normal. No respiratory distress. She has no wheezes. She has no rales.  Abdominal: Soft. Bowel sounds are normal. She exhibits no distension and no mass. There is no tenderness.  Musculoskeletal: Normal range of motion. She exhibits no edema or tenderness.  Upper / Lower Extremities: - Normal muscle tone, strength bilateral upper extremities 5/5, lower extremities 5/5  Lymphadenopathy:    She has no cervical adenopathy.  Neurological: She is alert and oriented to person, place, and time.  Distal sensation intact to light touch all extremities  Skin: Skin is warm and dry. No rash noted. She is not diaphoretic. No erythema.  Psychiatric: She has a normal mood and affect. Her behavior is normal.  Well groomed, good eye contact, normal speech and thoughts  Nursing note and vitals reviewed.  Results for orders placed or performed in visit on 10/04/17  T4, free  Result Value Ref Range   Free T4 0.8 0.8 - 1.8 ng/dL  TSH  Result Value Ref Range   TSH 2.36 mIU/L  VITAMIN D 25 Hydroxy (Vit-D Deficiency, Fractures)  Result Value Ref Range   Vit D, 25-Hydroxy 36 30 - 100 ng/mL  Lipid panel  Result Value Ref Range   Cholesterol 101 <200 mg/dL   HDL 29 (L) >50 mg/dL   Triglycerides 104 <150 mg/dL   LDL Cholesterol (Calc) 53 mg/dL (calc)   Total CHOL/HDL Ratio 3.5 <5.0 (calc)   Non-HDL Cholesterol (Calc) 72 <130 mg/dL (calc)  COMPLETE METABOLIC PANEL WITH GFR  Result Value Ref Range   Glucose, Bld 142 (H) 65 - 139 mg/dL   BUN 19 7 - 25 mg/dL   Creat 0.76 0.50 - 1.05 mg/dL   GFR, Est Non African American 90 > OR = 60 mL/min/1.23m2   GFR, Est African American 104 > OR = 60 mL/min/1.76m2   BUN/Creatinine Ratio NOT APPLICABLE 6 - 22 (calc)   Sodium 138 135 - 146 mmol/L    Potassium 3.9 3.5 - 5.3 mmol/L   Chloride 104 98 - 110 mmol/L   CO2 27 20 - 32 mmol/L   Calcium 9.1 8.6 - 10.4 mg/dL   Total Protein 6.2 6.1 - 8.1 g/dL   Albumin 3.8 3.6 - 5.1 g/dL   Globulin 2.4 1.9 - 3.7 g/dL (calc)   AG Ratio 1.6 1.0 - 2.5 (calc)   Total Bilirubin 0.4 0.2 - 1.2 mg/dL   Alkaline phosphatase (APISO) 86 33 - 130 U/L   AST 27 10 - 35 U/L   ALT 33 (H) 6 - 29 U/L  CBC with Differential/Platelet  Result Value Ref Range   WBC 6.4 3.8 - 10.8 Thousand/uL   RBC 4.47 3.80 - 5.10 Million/uL   Hemoglobin 12.6 11.7 - 15.5 g/dL   HCT 38.0 35.0 - 45.0 %   MCV 85.0 80.0 - 100.0 fL   MCH 28.2 27.0 - 33.0 pg   MCHC 33.2 32.0 - 36.0 g/dL   RDW 12.9 11.0 - 15.0 %   Platelets 224 140 - 400 Thousand/uL   MPV 11.8 7.5 - 12.5 fL   Neutro Abs 3,834 1,500 - 7,800 cells/uL   Lymphs Abs 1,926 850 - 3,900 cells/uL   WBC mixed population 410 200 - 950 cells/uL   Eosinophils Absolute 192 15 - 500 cells/uL   Basophils Absolute 38 0 - 200 cells/uL   Neutrophils Relative % 59.9 %   Total Lymphocyte 30.1 %  Monocytes Relative 6.4 %   Eosinophils Relative 3.0 %   Basophils Relative 0.6 %  Hemoglobin A1c  Result Value Ref Range   Hgb A1c MFr Bld 5.7 (H) <5.7 % of total Hgb   Mean Plasma Glucose 117 (calc)   eAG (mmol/L) 6.5 (calc)      Assessment & Plan:   Problem List Items Addressed This Visit    Acquired hypothyroidism    Controlled hypothyroidism - uncertainty regarding her episode symptoms of neck fullness and fatigue, only brief, seems unrelated to thyroid Normal TSH Free T4 Continue current Levothyroxine 77mcg daily  Future if worsening episodes or new concerns / symptoms, consider Neck US vs future refer to ENT / Endocrinology      Essential hypertension    Well-controlled HTN Known CAD    Plan:  1. Continue current BP regimen - Lisinopril 5mg  daily, Metoprolol 25mg  BID 2. Encourage improved lifestyle - low sodium diet, regular exercise 3. Continue monitor BP outside  office, bring readings to next visit, if persistently >140/90 or new symptoms notify office sooner      Hyperlipidemia    Mostly controlled cholesterol on statin and improving lifestyle Last lipid panel 09/2017 Known CAD  Plan: 1. Continue current meds - Rosuvastatin 20mg  daily 2. Encourage improved lifestyle - low carb/cholesterol, reduce portion size, continue improving regular exercise Follow-up        Morbid obesity with BMI of 40.0-44.9, adult ([place])    Still gradual chronic weight gain, overall past >2 years following MI and depression, hysterectomy GYN surgery 09/2016 Improved lifestyle, limited benefits Already established with [facility] - now referred to Bariatric Surgery, pending work-up and further management  Plan: 1. Counseling on lifestyle modifications again encouraged to continue improve diet, low carb, low sugar, inc water less soda. - Goals to improve regular exercise as tolerated  Restart metformin low dose today for PreDM for wt neutral med  Discussed GLP1 agents as potential option for future med management wt loss - check insurance cost coverage      Relevant Medications   metFORMIN (GLUCOPHAGE) 500 MG tablet   Prediabetes    Well-controlled Pre-DM with A1c 5.7, from 5.8 Concern with morbid obesity, HTN, HLD  Plan:  1. Restart Metformin 500mg  daily wc - may adjust to BID if needed 2. Encourage improved lifestyle - low carb, low sugar diet, reduce portion size, continue improving regular exercise 3. Follow-up 6 months PreDM A1c       Relevant Medications   metFORMIN (GLUCOPHAGE) 500 MG tablet   Vitamin D deficiency    Improved Continue VitD3 2k       Other Visit Diagnoses    Annual physical exam    -  Primary   Needs flu shot       Relevant Orders   Flu Vaccine QUAD 36+ mos IM (Completed)    Updated Health Maintenance information Reviewed recent lab results with patient Encouraged improvement to lifestyle with diet and exercise -  Goal of weight loss     Meds ordered this encounter  Medications  . metFORMIN (GLUCOPHAGE) 500 MG tablet    Sig: Take 1 tablet (500 mg total) by mouth daily with breakfast.    Dispense:  90 tablet    Refill:  1    Follow up plan: Return in about 6 months (around 04/10/2018) for PreDM A1c, Weight check, specialist f/u.  [name], [facility] 10/10/2017, 1:50 PM

## 2017-10-10 NOTE — Assessment & Plan Note (Addendum)
Well-controlled HTN Known CAD    Plan:  1. Continue current BP regimen - Lisinopril 5mg  daily, Metoprolol 25mg  BID 2. Encourage improved lifestyle - low sodium diet, regular exercise 3. Continue monitor BP outside office, bring readings to next visit, if persistently >140/90 or new symptoms notify office sooner

## 2017-10-10 NOTE — Assessment & Plan Note (Signed)
Mostly controlled cholesterol on statin and improving lifestyle Last lipid panel 09/2017 Known CAD  Plan: 1. Continue current meds - Rosuvastatin 20mg  daily 2. Encourage improved lifestyle - low carb/cholesterol, reduce portion size, continue improving regular exercise Follow-up

## 2017-10-10 NOTE — Patient Instructions (Addendum)
Thank you for coming to the office today.  Flu shot today  Keep up the good work overall with your lifestyle plan - stay tuned for further testing/follow-up from Bariatric.  For now - refilled Metformin 500mg  daily with food - we can consider TWICE daily if need in future.  For weight loss medication if interested / call insurance find cost and coverage of the following:  1. Ozempic (Semaglutide injection) - start 0.25mg  weekly for 4 weeks then increase to 0.5mg  weekly - This one has best benefit of weight loss and reducing Cardiovascular events   2. Bydureon BCise (Exenatide ER) - once weekly - this is my preference, very good medicine well tolerated, less side effects of nausea, upset stomach. No dose changes. Cost and coverage is the problem, but we may be able to get it with the coupon card  3. Trulicity (Dulaglutide) - once weekly - this is very good one, usually one of my top choices as well, two doses, 0.75 (likely we would start) and 1.5 max dose. We can use coupon card here too  4. Victoza (Liraglutide) - once DAILY - 3 dose changes 0.6, 1.2 and 1.8, side effects nausea, upset stomach higher on this one but it is still very effective medicine  ---------------------------------------------------  For Mammogram screening for breast cancer   Call [facility] - ask if they can complete your mammogram or if you need [place] to order it at [facility] can place order if needed once ordered you can call to schedule  Call the [place] below anytime to schedule your own appointment  [facility] [place], [place] [address] Phone: [phone]  Please schedule a Follow-up Appointment to: Return in about 6 months (around 04/10/2018) for PreDM A1c, Weight check, specialist f/u.  If you have any other questions or concerns, please feel free to call the office or send a message through [place]. You may also  schedule an earlier appointment if necessary.  Additionally, you may be receiving a survey about your experience at our office within a few days to 1 week by e-mail or mail. We value your feedback.  [name], DO [place]

## 2017-10-10 NOTE — Assessment & Plan Note (Signed)
Well-controlled Pre-DM with A1c 5.7, from 5.8 Concern with morbid obesity, HTN, HLD  Plan:  1. Restart Metformin 500mg  daily wc - may adjust to BID if needed 2. Encourage improved lifestyle - low carb, low sugar diet, reduce portion size, continue improving regular exercise 3. Follow-up 6 months PreDM A1c

## 2017-10-10 NOTE — Assessment & Plan Note (Signed)
Improved Continue VitD3 2k

## 2017-10-10 NOTE — Assessment & Plan Note (Signed)
Controlled hypothyroidism - uncertainty regarding her episode symptoms of neck fullness and fatigue, only brief, seems unrelated to thyroid Normal TSH Free T4 Continue current Levothyroxine 49mcg daily  Future if worsening episodes or new concerns / symptoms, consider Neck US vs future refer to ENT / Endocrinology

## 2017-10-10 NOTE — Assessment & Plan Note (Signed)
Still gradual chronic weight gain, overall past >2 years following MI and depression, hysterectomy GYN surgery 09/2016 Improved lifestyle, limited benefits Already established with [facility] - now referred to Bariatric Surgery, pending work-up and further management  Plan: 1. Counseling on lifestyle modifications again encouraged to continue improve diet, low carb, low sugar, inc water less soda. - Goals to improve regular exercise as tolerated  Restart metformin low dose today for PreDM for wt neutral med  Discussed GLP1 agents as potential option for future med management wt loss - check insurance cost coverage

## 2017-10-15 ENCOUNTER — Other Ambulatory Visit: Payer: Self-pay | Admitting: Interventional Cardiology

## 2017-10-20 ENCOUNTER — Other Ambulatory Visit: Payer: Self-pay | Admitting: Interventional Cardiology

## 2017-10-26 ENCOUNTER — Ambulatory Visit (HOSPITAL_COMMUNITY)
Admission: RE | Admit: 2017-10-26 | Discharge: 2017-10-26 | Disposition: A | Discharge status: Home / Self Care | Payer: BLUE CROSS/BLUE SHIELD | Source: Ambulatory Visit | Attending: Surgery | Admitting: Surgery

## 2017-10-26 ENCOUNTER — Other Ambulatory Visit (HOSPITAL_COMMUNITY): Payer: Self-pay | Admitting: Surgery

## 2017-10-26 DIAGNOSIS — I10 Essential (primary) hypertension: Secondary | ICD-10-CM | POA: Diagnosis not present

## 2017-10-26 DIAGNOSIS — Z01818 Encounter for other preprocedural examination: Secondary | ICD-10-CM | POA: Diagnosis not present

## 2017-10-28 ENCOUNTER — Other Ambulatory Visit: Payer: Self-pay | Admitting: Interventional Cardiology

## 2017-11-02 ENCOUNTER — Encounter: Payer: Self-pay | Admitting: Dietician

## 2017-11-02 ENCOUNTER — Encounter: Payer: BLUE CROSS/BLUE SHIELD | Attending: Surgery | Admitting: Dietician

## 2017-11-02 VITALS — Ht 65.0 in | Wt 263.2 lb

## 2017-11-02 DIAGNOSIS — Z6841 Body Mass Index (BMI) 40.0 and over, adult: Secondary | ICD-10-CM

## 2017-11-02 NOTE — Progress Notes (Signed)
Nutritional Assessment:     Re: Sheri Hawk" [name]   DOB: 1965/06/05    Proposed Surgery: Roux-en-Y or Sleeve Gastrectomy; undecided  MD: Johnathan Hausen, MD RD:  Clint Bolder Sheri, RD, LDN  Height 5\' 5"   Weight 263 lbs  BMI 43.80  Upper IBW/% UIBW 125 lbs / 152%  Patient's goal weight 140 lbs  Medical History STEMI, HTN, Hypothyroidism, Vit D deficiency, HLD, Obesity  Medications/Supplements Vitamin D3, Isosorbide, Metformin, Synthroid, Citalopram, Lisinopril, Clopidogrel, Metoprolol tartrate, Rosuvastatin, Zofran prn  Previous Surgeries Stent, hysterectomy  Drug Allergies Penicillin  Food Allergies None  Drink Alcohol No  Smoke Cigarettes No  Physical Activity Pushes the stroller with her grandchild, used to do cardio + weights at a gym prior to STEMI  Weight History As a child and into early adulthood she was always highly active and never 'had a problem with weight.' Wt was stable at 125# for most of early adulthood. After having her 3rd child by c-section she started to gain wt. Her activity level after this child decreased d/t husband's work schedule. When wt hit 175# she started an exercise program which was effective until her STEMI. Since this time she has been gaining wt steadily, 40-50# within the past year alone  Dieting/Weight Loss History  HCG shots, lost 30# which was regained   High protein diet per [facility] doctor protocol  500 calories/day  Julien Girt diet    Dietary Recall/Food and Fluid: Sheri [name] lives with her husband and temporarily with her daughter and grandchild.  She performs the food shopping and she and her husband do the meal preparation.   Dining out: 2-3 meals per week.  Typical eating pattern: 1-3 meals and 0-1 snacks daily.  Breakfast: not always; oatmeal, eggs + grits + bacon once per week or every other week Snack: not usually Lunch: sometimes skips; salads with chicken breast, lower sodium frozen meal Snack: not usually Supper: beef ribs +  steamed okra, chicken, steak infrequently, various vegetables, GF pasta occasionally Snack: sometimes; grapes, oatmeal pies Beverages: cherry Pepsi 16oz/day, water, juice, coffee with 1Tbs creamer + Sweet n' low  Psychosocial:                                                                                                                                       She denies binge eating, or other disordered eating behaviors. She does not engage in emotional eating.  Instruction:  Sheri [name] has been instructed on the following: . Pre-op dietary changes, particularly eliminating carbonated and SSBs IE Chery Pepsi, slowing down at meal times and chewing thoroughly, eating on a consistent schedule IE not skipping meals, and monitoring protein intake more closely . Pre-op liver reduction diet.  . Overview of post-op diet changes, including diet progression, protein and fluid emphasis, nausea, vitamins and minerals . She was commended for the changes she has made thus far.   Intervention: . Sheri [name] is currently making good food choices. . She is choosing lean protein sources, working to reduce portion sizes, and choosing a variety of vegetables daily . She has a plan in place to begin engaging in physical exercise on a regular basis post-op. She is being as active as possible with current constraints  . She has researched this procedure and its complications online and by talking with others who have had the procedure. . She voices understanding of the instruction listed above  Summary: Sheri [name] appears very motivated for surgery, as she has done research on the process and has had a significant health event which has motivated her to lose weight and adopt a healthier lifestyle. She also has a new grandchild that she wants to be healthy to take care of.  She will continue to  work on eliminating sodas and juices, eating slower at meals times, and increasing physical activity. Due to these factors and solid support from friends and family, from a nutrition standpoint, she is ready to proceed with the bariatric surgery process.                                                                                                                  Plan: The patient is requested to participate in pre- and post-operative follow-up:   pre-op class 2-4 weeks prior to surgery- scheduled for November 16, 2017  post-op visits beginning at 2-3 weeks post-op  Next RD appointment: Pt was required to attend 1 supervised wt loss visit prior to pre-op class per insurance. No future appointments with RD at this time

## 2017-11-16 ENCOUNTER — Encounter (INDEPENDENT_AMBULATORY_CARE_PROVIDER_SITE_OTHER): Payer: BLUE CROSS/BLUE SHIELD

## 2017-11-16 DIAGNOSIS — Z6841 Body Mass Index (BMI) 40.0 and over, adult: Secondary | ICD-10-CM | POA: Diagnosis not present

## 2017-11-16 NOTE — Progress Notes (Signed)
Cardiology Office Note   Date:  11/19/2017   ID:  Sheri [name], DOB Jan 20, 1966, MRN [id number]  PCP:  Olin Hauser, DO    No chief complaint on file.  CAD  Wt Readings from Last 3 Encounters:  11/19/17 264 lb 9.6 oz (120 kg)  11/02/17 263 lb 3.2 oz (119.4 kg)  10/10/17 262 lb (118.8 kg)       History of Present Illness: Sheri [name] is a 52 y.o. female  who is being seen today for post ED f/u. She was seen at [facility] ED on 08/07/17 with complaints of periorbital tingling + tingling of her hands, dyspnea and palpitations. She also noted some mild chest discomfort. W/u in the ED was negative. EKG was nonischemic and troponin's were negative x 2. CXR unremarkable. K WNL but Ca was mildly low at 8.8. CBC normal. The EDP felt that her symptoms were possibly due to anxiety. She was offered admission for overnight observation but declined.   In summary, she has a known history of CAD. In March 2016,she was admitted for an inferior MI. Left heart catheterization showed culprit vessel to be anoccluded RCA for which she underwent PCI plus stenting. 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 This was treated medically.The left main coronary artery is absent. 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 She was placed on dual antiplatelet therapy. Several months later, she was seenin October 2016 for exertional dyspnea. There was question as to whether or not this was her anginal equivalent,secondary to her circumflex disease. Subsequently she had a a nuclear stress test to assess for underlying ischemia. This was a low risk study. There was asmall, mild intensity lateral fixed perfusion defect which was felt to be attenuation artifact. There was no significant reversible ischemia. LVEF was 64% with normal wall motion. 2D echo also confirmed normal LVEF and BNP  was also normal. Additionalpast medical history also includes hypertension and hyperlipidemia for which she is being treated for.  Se had some tingling sensations but this improived with calcium supplements.    She is planning bariatric surgery in the near future. Most strenuous activity is walking up stairs and carrying a baby.  No problems with these activities.   She has done well since her ER visit in July 2019.  Denies : Chest pain. Dizziness. Leg edema. Nitroglycerin use. Orthopnea. Palpitations. Paroxysmal nocturnal dyspnea. Shortness of breath. Syncope.   She is trying to improve her diet.  SHe has decreased soda.  She is going to do the BELT program after surgery.       Past Medical History:  Diagnosis Date  . Abnormal uterine bleeding   . Anomaly heart   . Anxiety   . Coronary artery disease   . Dyspnea    with exertion  . Dysrhythmia   . Heart attack ([place]) 03/2014  . Heart disease   . History of C-section   . History of kidney stones   . Hx of heart artery stent   . Hypertension     Past Surgical History:  Procedure Laterality Date  . CARDIAC CATHETERIZATION    . CARDIAC SURGERY    . [place]  . CHOLECYSTECTOMY    . CORONARY ANGIOPLASTY    . CYSTOSCOPY  10/10/2016   Procedure: CYSTOSCOPY;  Surgeon: Will Bonnet, MD;  Location: [facility]  ORS;  Service: Gynecology;;  . Consuela Mimes WITH STENT PLACEMENT Left 11/01/2015   Procedure: CYSTOSCOPY WITH STENT PLACEMENT;  Surgeon: Hollice Espy, MD;  Location: [facility] ORS;  Service: Urology;  Laterality: Left;  . CYSTOSCOPY/RETROGRADE/URETEROSCOPY Bilateral 11/01/2015   Procedure: CYSTOSCOPY/RETROGRADE/URETEROSCOPY;  Surgeon: Hollice Espy, MD;  Location: [facility] ORS;  Service: Urology;  Laterality: Bilateral;  . DILATION AND CURETTAGE OF UTERUS  2011  . ENDOMETRIAL ABLATION  2011  . LAPAROSCOPIC HYSTERECTOMY Bilateral 10/10/2016   Procedure: HYSTERECTOMY TOTAL LAPAROSCOPIC BILATERAL  SALPINGECTOMY;  Surgeon: Will Bonnet, MD;  Location: [facility] ORS;  Service: Gynecology;  Laterality: Bilateral;  . LEFT HEART CATHETERIZATION WITH CORONARY ANGIOGRAM N/A 04/06/2014   Procedure: LEFT HEART CATHETERIZATION WITH CORONARY ANGIOGRAM;  Surgeon: Jettie Booze, MD;  Location: [facility] CATH LAB;  Service: Cardiovascular;  Laterality: N/A;     Current Outpatient Medications  Medication Sig Dispense Refill  . Chromium 200 MCG CAPS Take by mouth. 30 each 0  . clopidogrel (PLAVIX) 75 MG tablet TAKE 1 TABLET BY MOUTH EVERY DAY 90 tablet 1  . escitalopram (LEXAPRO) 20 MG tablet Take 1 tablet (20 mg total) by mouth daily. 90 tablet 1  . isosorbide mononitrate (IMDUR) 30 MG 24 hr tablet Take 1 tablet (30 mg total) by mouth daily. 90 tablet 3  . levothyroxine (SYNTHROID, LEVOTHROID) 25 MCG tablet TAKE 1 TABLET (25 MCG TOTAL) BY MOUTH DAILY BEFORE BREAKFAST. 30 tablet 5  . lisinopril (PRINIVIL,ZESTRIL) 5 MG tablet TAKE 1 TABLET BY MOUTH EVERY DAY 90 tablet 3  . Melatonin 1 MG CAPS Take by mouth.  0  . metFORMIN (GLUCOPHAGE) 500 MG tablet Take 1 tablet (500 mg total) by mouth daily with breakfast. 90 tablet 1  . metoprolol tartrate (LOPRESSOR) 25 MG tablet TAKE 1 TABLET (25 MG TOTAL) TWICE A DAY (PATIENT NEEDS TO CALL AND SCHEDULE AN APPOINTMENT FOR FURTHER REFILLS FIRST ATTEMPT) 60 tablet 0  . nitroGLYCERIN (NITROSTAT) 0.4 MG SL tablet Place 1 tablet (0.4 mg total) under the tongue every 5 (five) minutes x 3 doses as needed for chest pain. 25 tablet 2  . ondansetron (ZOFRAN) 4 MG tablet Take 4 mg by mouth every 8 (eight) hours as needed for nausea or vomiting.    . rosuvastatin (CRESTOR) 20 MG tablet TAKE 1 TABLET DAILY 90 tablet 2  . tamsulosin (FLOMAX) 0.4 MG CAPS capsule TAKE 1 CAPSULE BY MOUTH EVERY DAY 30 capsule 0  . Vitamin D, Cholecalciferol, 1000 units CAPS Take 2,000 Units by mouth daily.     No current facility-administered medications for this visit.     Allergies:   Latex and  Penicillins    Social History:  The patient  reports that she has quit smoking. Her smoking use included cigarettes. She smoked 1.00 pack per day. She has quit using smokeless tobacco. She reports that she does not drink alcohol or use drugs.   Family History:  The patient's family history includes Heart attack (age of onset: 56) in her father; Heart disease in her father; High Cholesterol in her father; High blood pressure in her father; Hyperlipidemia in her mother; Hypertension in her mother; Hypothyroidism in her mother; Kidney Stones in her brother and sister; Liver cancer in her maternal grandmother; Stroke in her paternal grandfather; Sudden death in her father.    ROS:  Please see the history of present illness.   Otherwise, review of systems are positive for weight gain.   All other systems are reviewed and negative.    PHYSICAL EXAM:  VS:  BP 126/84   Pulse 69   Ht 5\' 5"  (1.651 m)   Wt 264 lb 9.6 oz (120 kg)   LMP 09/13/2016   SpO2 94%   BMI 44.03 kg/m  , BMI Body mass index is 44.03 kg/m. GEN: Well nourished, well developed, in no acute distress  HEENT: normal  Neck: no JVD, carotid bruits, or masses Cardiac: RRR; no murmurs, rubs, or gallops,no edema  Respiratory:  clear to auscultation bilaterally, normal work of breathing GI: soft, nontender, nondistended, + BS, obese MS: no deformity or atrophy  Skin: warm and dry, no rash Neuro:  Strength and sensation are intact Psych: euthymic mood, full affect   EKG:   The ekg ordered in July 2019 demonstrates NSR, no ST changes   Recent Labs: 10/04/2017: ALT 33; BUN 19; Creat 0.76; Hemoglobin 12.6; Platelets 224; Potassium 3.9; Sodium 138; TSH 2.36   Lipid Panel    Component Value Date/Time   CHOL 101 10/04/2017 0814   CHOL 143 05/14/2017 1145   TRIG 104 10/04/2017 0814   HDL 29 (L) 10/04/2017 0814   HDL 32 (L) 05/14/2017 1145   CHOLHDL 3.5 10/04/2017 0814   VLDL 24.8 07/27/2014 0943   LDLCALC 53 10/04/2017 0814      Other studies Reviewed: Additional studies/ records that were reviewed today with results demonstrating: LDL 51 in 9/19.   ASSESSMENT AND PLAN:  1. CAD/Old MI: No angina. Continue aggressive secondary prevention.  She should be able to tolerate her cardiac medicines post weight loss surgery.  The pills are small.  She should not crush the isosorbide since it is a time release tablet. 2. HTN: The current medical regimen is effective;  continue present plan and medications. 3. Dyslipidemia: Continue Crestor. The current medical regimen is effective;  continue present plan and medications. 4. Morbid obesiy: Planning weight loss surgery with Dr. Hassell Done.  She has improved her diet significantly.  No further cardiac testing needed before weight loss surgery.  Ok to hold Plavix 5 days prior to surgery.   Current medicines are reviewed at length with the patient today.  The patient concerns regarding her medicines were addressed.  The following changes have been made:  No change  Labs/ tests ordered today include:  No orders of the defined types were placed in this encounter.   Recommend 150 minutes/week of aerobic exercise Low fat, low carb, high fiber diet recommended  Disposition:   FU in 1 year   Signed, Larae Grooms, MD  11/19/2017 8:48 AM    [facility] [address], [place], [place]  [address] Phone: [phone]; Fax: [phone]

## 2017-11-16 NOTE — Progress Notes (Signed)
Pre-Operative Nutrition Class:  Appt start time: 0900   End time:  1100  Patient was seen on 11/16/17 for Pre-Operative Bariatric Surgery Education at [facility].   Surgery date: TBD Surgery type: undecided  [facility]: 263.0lbs Weight today: 263.2lbs  InBody  BODY COMP RESULTS    BMI (kg/m^2) 43.8  Fat Mass (lbs) 141.3  Fat Free Mass (lbs) 32.4  Total Body Water (lbs) 89.5   Samples given per MNT protocol. Patient educated on appropriate usage:  Celebrate Vitamins Multivitamin  Lot # [id number]  Exp: 05/2018  Celebrate Vitamins Calcium Citrate  Lot # [id number]   Exp: 05/2018  Premier Protein Clear Fruit punch   Lot# [id number]   Exp: 04/18/2018  Premier Protein Chocolate shake Lot# [id number]    Exp: 05/08/2018  Renee Pain Protein Powder  Lot # [id number]  Exp: 10/2018;  Lot# [id number],  Exp: 07/2018;  Lot# [id number], Exp: 10/2018  The following the learning objectives were met by the patient during this course:  Identify Pre-Op Dietary Goals and will begin 2 weeks pre-operatively  Identify appropriate sources of fluids and proteins   State protein recommendations and appropriate sources pre and post-operatively  Identify Post-Operative Dietary Goals and will follow for 2 weeks post-operatively  Identify appropriate multivitamin and calcium sources  Describe the need for physical activity post-operatively and will follow MD recommendations  State when to call healthcare provider regarding medication questions or post-operative complications  Handouts given during class include:  Pre-Op Bariatric Surgery Diet Handout  Protein Shake Handout  Post-Op Bariatric Surgery Nutrition Handout  BELT Program Information Flyer  Support Group Information Flyer  [facility] Bariatric Supplements Price List  Follow-Up Plan: Patient will follow-up at [place], at about 2 weeks post operatively for diet advancement per MD.

## 2017-11-19 ENCOUNTER — Encounter

## 2017-11-19 ENCOUNTER — Encounter: Payer: Self-pay | Admitting: Interventional Cardiology

## 2017-11-19 ENCOUNTER — Ambulatory Visit: Payer: BLUE CROSS/BLUE SHIELD | Admitting: Interventional Cardiology

## 2017-11-19 VITALS — BP 126/84 | HR 69 | Ht 65.0 in | Wt 264.6 lb

## 2017-11-19 DIAGNOSIS — I2510 Atherosclerotic heart disease of native coronary artery without angina pectoris: Secondary | ICD-10-CM | POA: Diagnosis not present

## 2017-11-19 DIAGNOSIS — I252 Old myocardial infarction: Secondary | ICD-10-CM

## 2017-11-19 DIAGNOSIS — E782 Mixed hyperlipidemia: Secondary | ICD-10-CM

## 2017-11-19 NOTE — Patient Instructions (Signed)

## 2017-11-23 DIAGNOSIS — N200 Calculus of kidney: Secondary | ICD-10-CM

## 2017-11-23 MED ORDER — ONDANSETRON 4 MG PO TBDP: 4.0000 mg | ORAL_TABLET | Freq: Three times a day (TID) | ORAL | 0 refills | Status: DC | PRN

## 2017-11-23 MED ORDER — HYDROCODONE-ACETAMINOPHEN 5-325 MG PO TABS: 1.0000 | ORAL_TABLET | Freq: Four times a day (QID) | ORAL | 0 refills | Status: DC | PRN

## 2017-11-23 NOTE — Telephone Encounter (Signed)
Called patient back to discuss. She has known Nephrolithiasis in past, issues with passing stones, requested similar treatment she has had before, Flomax, Zofran, Hydrocodone and she will follow-up with Urology if not passed stone in few days. She is aware of warning signs or criteria to seek care more immediately if pain severe ,cannot keep down food/liquids nausea, fever chills unable to urinate  [name], DO [facility] 11/23/2017, 12:55 PM

## 2017-11-30 ENCOUNTER — Ambulatory Visit (INDEPENDENT_AMBULATORY_CARE_PROVIDER_SITE_OTHER): Payer: BLUE CROSS/BLUE SHIELD | Admitting: Family Medicine

## 2017-11-30 ENCOUNTER — Encounter: Payer: Self-pay | Admitting: Family Medicine

## 2017-11-30 VITALS — BP 131/87 | HR 83 | Temp 98.4°F | Resp 16 | Ht 65.0 in | Wt 262.0 lb

## 2017-11-30 DIAGNOSIS — N200 Calculus of kidney: Secondary | ICD-10-CM | POA: Diagnosis not present

## 2017-11-30 DIAGNOSIS — R109 Unspecified abdominal pain: Secondary | ICD-10-CM | POA: Diagnosis not present

## 2017-11-30 DIAGNOSIS — N3001 Acute cystitis with hematuria: Secondary | ICD-10-CM | POA: Diagnosis not present

## 2017-11-30 LAB — POCT URINALYSIS DIPSTICK
Bilirubin, UA: NEGATIVE
Glucose, UA: NEGATIVE
Ketones, UA: NEGATIVE
NITRITE UA: NEGATIVE
PH UA: 5 (ref 5.0–8.0)
PROTEIN UA: POSITIVE — AB
SPEC GRAV UA: 1.02 (ref 1.010–1.025)
Urobilinogen, UA: 0.2 E.U./dL

## 2017-11-30 MED ORDER — CYCLOBENZAPRINE HCL 10 MG PO TABS: 10.0000 mg | ORAL_TABLET | Freq: Three times a day (TID) | ORAL | 0 refills | Status: DC | PRN

## 2017-11-30 MED ORDER — HYDROCODONE-ACETAMINOPHEN 5-325 MG PO TABS: 1.0000 | ORAL_TABLET | Freq: Four times a day (QID) | ORAL | 0 refills | Status: DC | PRN

## 2017-11-30 MED ORDER — CIPROFLOXACIN HCL 500 MG PO TABS: 500.0000 mg | ORAL_TABLET | Freq: Two times a day (BID) | ORAL | 0 refills | Status: DC

## 2017-11-30 NOTE — Progress Notes (Signed)
Subjective:    Patient ID: Sheri [name], female    DOB: 03/27/1965, 52 y.o.   MRN: [id number]  Sheri [name] is a 52 y.o. female presenting on 11/30/2017 for Abdominal Pain (RUQ also back pain also pain radiate to knee felt like stone will come out but yesterday was very painful and nausea ) and hx of kidney stone  Patient presents for a same day appointment. Daughter Danae Chen accompanied her.  HPI   R Flank / Abdominal Pain / Nephrolithiasis: - Recent update prior to visit on 11/23/17 she sent a mychart message about more urgent medical symptoms describing significant concern for nephrolithiasis, similar to prior episodes, she has been treated in past by Urology and has done well on tamsulosin and pain medicine, zofran. I agreed to send the medications at that time, with understanding she would follow-up closely or with Urology. - Today she returned since not improved within 1 week. Reports symptoms started about 1 week ago with kidney stone symptoms, mostly on R side flank / back / abdomen, she felt uncomfortable with abdominal pain and radiating and back pain. Seemed to have improved temporarily and then worsened again last night. - Additionally she took existing antibiotic left over at home, Keflex last weekend took for 2-3 days, felt better then worse again - temporary relief, only had few days worth - Regarding urinary symptoms, she does have some dysuria and discomfort, no gross blood in urine, she feels like "stone may be ready to pass" and feels it changing positions she thinks due to episodes of discomfort - Admits still urinating, has not been unable to urinate - Denies any fever, chills, sweats, diarrhea, cough, dyspnea, chest pain or tightness  Health Maintenance: UTD Flu Vaccine 10/10/17  Depression screen [facility] 2/9 11/30/2017 11/02/2017 10/10/2017  Decreased Interest 0 0 0  Down, Depressed, Hopeless 0 0 0  PHQ - 2 Score 0 0 0  Altered sleeping 0 - 0  Tired, decreased energy 0 - 0    Change in appetite 0 - 0  Feeling bad or failure about yourself  0 - 0  Trouble concentrating 0 - 0  Moving slowly or fidgety/restless 0 - 0  Suicidal thoughts 0 - 0  PHQ-9 Score 0 - 0  Difficult doing work/chores Not difficult at all - Not difficult at all  Some recent data might be hidden    Social History   Tobacco Use  . Smoking status: Former Smoker    Packs/day: 1.00    Types: Cigarettes  . Smokeless tobacco: Former Systems developer  . Tobacco comment: quit 20 years  Substance Use Topics  . Alcohol use: No    Alcohol/week: 0.0 standard drinks  . Drug use: No   Past Surgical History:  Procedure Laterality Date  . CARDIAC CATHETERIZATION    . CARDIAC SURGERY    . [place]  . CHOLECYSTECTOMY    . CORONARY ANGIOPLASTY    . CYSTOSCOPY  10/10/2016   Procedure: CYSTOSCOPY;  Surgeon: Will Bonnet, MD;  Location: [facility];  Service: Gynecology;;  . Consuela Mimes WITH STENT PLACEMENT Left 11/01/2015   Procedure: CYSTOSCOPY WITH STENT PLACEMENT;  Surgeon: Hollice Espy, MD;  Location: [facility];  Service: Urology;  Laterality: Left;  . CYSTOSCOPY/RETROGRADE/URETEROSCOPY Bilateral 11/01/2015   Procedure: CYSTOSCOPY/RETROGRADE/URETEROSCOPY;  Surgeon: Hollice Espy, MD;  Location: [facility];  Service: Urology;  Laterality: Bilateral;  . DILATION AND CURETTAGE OF UTERUS  2011  . ENDOMETRIAL ABLATION  2011  . LAPAROSCOPIC HYSTERECTOMY Bilateral 10/10/2016   Procedure: HYSTERECTOMY TOTAL LAPAROSCOPIC BILATERAL SALPINGECTOMY;  Surgeon: Will Bonnet, MD;  Location: [facility];  Service: Gynecology;  Laterality: Bilateral;  . LEFT HEART CATHETERIZATION WITH CORONARY ANGIOGRAM N/A 04/06/2014   Procedure: LEFT HEART CATHETERIZATION WITH CORONARY ANGIOGRAM;  Surgeon: Jettie Booze, MD;  [facility];  Service: Cardiovascular;  Laterality: N/A;     Review of Systems Per HPI unless specifically indicated above     Objective:    BP 131/87   Pulse 83    Temp 98.4 F (36.9 C) (Oral)   Resp 16   Ht 5\' 5"  (1.651 m)   Wt 262 lb (118.8 kg)   LMP 09/13/2016   BMI 43.60 kg/m   Wt Readings from Last 3 Encounters:  11/30/17 262 lb (118.8 kg)  11/19/17 264 lb 9.6 oz (120 kg)  11/02/17 263 lb 3.2 oz (119.4 kg)    Physical Exam  Constitutional: She is oriented to person, place, and time. She appears well-developed and well-nourished. No distress.  Well-appearing, comfortable, cooperative, obese  HENT:  Head: Normocephalic and atraumatic.  Mouth/Throat: Oropharynx is clear and moist.  Eyes: Conjunctivae are normal. Right eye exhibits no discharge. Left eye exhibits no discharge.  Neck: Normal range of motion. Neck supple. No thyromegaly present.  Cardiovascular: Normal rate, regular rhythm, normal heart sounds and intact distal pulses.  No murmur heard. Pulmonary/Chest: Effort normal and breath sounds normal. No respiratory distress. She has no wheezes. She has no rales.  Abdominal: Bowel sounds are normal. She exhibits no distension. There is tenderness in the right upper quadrant, suprapubic area and left upper quadrant. There is no rigidity, no rebound, no guarding, no CVA tenderness, no tenderness at McBurney's point and negative Murphy's sign.  Musculoskeletal: Normal range of motion. She exhibits no edema.  Lower R back to flank pain, tender on palpation, not CVAT  Lymphadenopathy:    She has no cervical adenopathy.  Neurological: She is alert and oriented to person, place, and time.  Skin: Skin is warm and dry. No rash noted. She is not diaphoretic. No erythema.  Psychiatric: She has a normal mood and affect. Her behavior is normal.  Well groomed, good eye contact, normal speech and thoughts  Nursing note and vitals reviewed.    Results for orders placed or performed in visit on 11/30/17  POCT Urinalysis Dipstick  Result Value Ref Range   Color, UA amber    Clarity, UA clear    Glucose, UA Negative Negative   Bilirubin, UA  negative    Ketones, UA negative    Spec Grav, UA 1.020 1.010 - 1.025   Blood, UA large    pH, UA 5.0 5.0 - 8.0   Protein, UA Positive (A) Negative   Urobilinogen, UA 0.2 0.2 or 1.0 E.U./dL   Nitrite, UA negative    Leukocytes, UA Large (3+) (A) Negative   Appearance clear    Odor none       Assessment & Plan:   Problem List Items Addressed This Visit    None    Visit Diagnoses    Nephrolithiasis    -  Primary   Relevant Medications   ciprofloxacin (CIPRO) 500 MG tablet   HYDROcodone-acetaminophen (NORCO/VICODIN) 5-325 MG tablet   cyclobenzaprine (FLEXERIL) 10 MG tablet   Other Relevant Orders   POCT Urinalysis Dipstick (Completed)   Urine Culture   Ambulatory referral to Urology   Acute cystitis with hematuria  Relevant Medications   ciprofloxacin (CIPRO) 500 MG tablet   Other Relevant Orders   POCT Urinalysis Dipstick (Completed)   Urine Culture   Ambulatory referral to Urology      Clinically with suspected R nephrolithiasis and possibly underlying UTI. Potentially pyelonephritis but without fever, seems less likely based on clinical presentation. - Improved on management of nephrolithiasis already still has not passed stone - Has not been back to Urology since 2016 [facility]  Plan: 1. Urinalysis dipstick concern for infection and kidney stone 2. Send Urine Culture - pending result, however note she already took Keflex few doses < 1 week ago 3. Empiric coverage for UTI with Cipro 500 BID 7 days - she has PCN allergy - considered Ceftriaxone IM in office but agreed to hold due to risk of allergy 4. Agree to refill temporary pain med hydrocodone 5 day supply PRN for acute nephrolithiasis, order Flexeril PRN as well for muscle / ureterospasm - Already has Zofran at home , may use, already has Floamx at home 5. Emphasized that ultimately most comprehensive and prompt care would be to go directly to hospital ED for evaluation and CT imaging if her symptoms were too severe,  however she prefers to try treatment first and request to see Urology instead if they can get her in sooner - Placed Urgent referral to return to existing [facility] - she was advised there is a chance they may not be able to see her soon enough and if symptoms worsen despite treatment she agrees to go to hospital ED for evaluation / management   Meds ordered this encounter  Medications  . ciprofloxacin (CIPRO) 500 MG tablet    Sig: Take 1 tablet (500 mg total) by mouth 2 (two) times daily. For 7 days    Dispense:  14 tablet    Refill:  0  . HYDROcodone-acetaminophen (NORCO/VICODIN) 5-325 MG tablet    Sig: Take 1 tablet by mouth every 6 (six) hours as needed for moderate pain.    Dispense:  20 tablet    Refill:  0  . cyclobenzaprine (FLEXERIL) 10 MG tablet    Sig: Take 1 tablet (10 mg total) by mouth 3 (three) times daily as needed for muscle spasms.    Dispense:  30 tablet    Refill:  0   Orders Placed This Encounter  Procedures  . Urine Culture  . Ambulatory referral to Urology    Referral Priority:   Urgent    Referral Type:   Consultation    Referral Reason:   Specialty Services Required    Requested Specialty:   Urology    Number of Visits Requested:   1  . POCT Urinalysis Dipstick    Follow up plan: Return in about 1 week (around 12/07/2017), or if symptoms worsen or fail to improve, for kidney stone.  [name], [facility] 11/30/2017, 10:19 AM

## 2017-11-30 NOTE — Patient Instructions (Addendum)
Thank you for coming to the office today.  You most likely have a Kidney Stone (Nephrolithiasis)  Please [facility] to see if they can see you urgently, if need new order for referral let us know.  Very important to see Urologist as scheduled and discuss removal options and they may repeat imaging  If you get any of the following it is more of an emergency and may need to go to hospital directly or we can try to contact Urology sooner  - Fever, chills sweats nausea vomiting cannot tolerate antibiotic - Cannot pee or void at all for 12 hours straight, despite straining and medicine - Worsening kidney function Creatinine - based on lab test today  I am concerned about possible UTI or bladder infection, you do not have symptoms of a kidney infection at this time.  We will send urine for culture - stay tuned for results within 48 hours or so, if we need to adjust antibiotic  Start Cipro antibiotic 2 times daily for 7 days - finish entire course  Also take Zofran and Flomax 0.4mg  daily for help improve urine flow   Please schedule a Follow-up Appointment to: Return in about 1 week (around 12/07/2017), or if symptoms worsen or fail to improve, for kidney stone.  If you have any other questions or concerns, please feel free to call the office or send a message through [place]. You may also schedule an earlier appointment if necessary.  Additionally, you may be receiving a survey about your experience at our office within a few days to 1 week by e-mail or mail. We value your feedback.  [name], DO [place]

## 2017-12-03 LAB — URINE CULTURE
MICRO NUMBER:: 91318873
SPECIMEN QUALITY: ADEQUATE

## 2017-12-04 ENCOUNTER — Other Ambulatory Visit: Payer: Self-pay | Admitting: Interventional Cardiology

## 2017-12-14 ENCOUNTER — Other Ambulatory Visit: Payer: Self-pay | Admitting: Family Medicine

## 2017-12-14 DIAGNOSIS — F419 Anxiety disorder, unspecified: Principal | ICD-10-CM

## 2017-12-14 DIAGNOSIS — F32A Depression, unspecified: Secondary | ICD-10-CM

## 2017-12-14 DIAGNOSIS — F329 Major depressive disorder, single episode, unspecified: Secondary | ICD-10-CM

## 2017-12-20 ENCOUNTER — Encounter
Admission: EM | Disposition: A | Discharge status: Home / Self Care | Payer: Self-pay | Source: Home / Self Care | Attending: Emergency Medicine

## 2017-12-20 ENCOUNTER — Other Ambulatory Visit: Payer: Self-pay

## 2017-12-20 ENCOUNTER — Observation Stay
Admission: EM | Admit: 2017-12-20 | Discharge: 2017-12-20 | Disposition: A | Discharge status: Home / Self Care | Payer: BLUE CROSS/BLUE SHIELD | Attending: Urology | Admitting: Urology

## 2017-12-20 ENCOUNTER — Emergency Department: Payer: BLUE CROSS/BLUE SHIELD

## 2017-12-20 ENCOUNTER — Observation Stay: Payer: BLUE CROSS/BLUE SHIELD | Admitting: Anesthesiology

## 2017-12-20 ENCOUNTER — Encounter: Payer: Self-pay | Admitting: Emergency Medicine

## 2017-12-20 DIAGNOSIS — N200 Calculus of kidney: Secondary | ICD-10-CM | POA: Diagnosis not present

## 2017-12-20 DIAGNOSIS — I700 Atherosclerosis of aorta: Secondary | ICD-10-CM | POA: Diagnosis not present

## 2017-12-20 DIAGNOSIS — I10 Essential (primary) hypertension: Secondary | ICD-10-CM | POA: Insufficient documentation

## 2017-12-20 DIAGNOSIS — I2510 Atherosclerotic heart disease of native coronary artery without angina pectoris: Secondary | ICD-10-CM | POA: Insufficient documentation

## 2017-12-20 DIAGNOSIS — N201 Calculus of ureter: Secondary | ICD-10-CM | POA: Diagnosis not present

## 2017-12-20 DIAGNOSIS — R1030 Lower abdominal pain, unspecified: Secondary | ICD-10-CM | POA: Diagnosis present

## 2017-12-20 DIAGNOSIS — Z955 Presence of coronary angioplasty implant and graft: Secondary | ICD-10-CM | POA: Diagnosis not present

## 2017-12-20 DIAGNOSIS — Z87891 Personal history of nicotine dependence: Secondary | ICD-10-CM | POA: Diagnosis not present

## 2017-12-20 DIAGNOSIS — I252 Old myocardial infarction: Secondary | ICD-10-CM | POA: Diagnosis not present

## 2017-12-20 DIAGNOSIS — Z7982 Long term (current) use of aspirin: Secondary | ICD-10-CM | POA: Insufficient documentation

## 2017-12-20 DIAGNOSIS — Z7902 Long term (current) use of antithrombotics/antiplatelets: Secondary | ICD-10-CM | POA: Insufficient documentation

## 2017-12-20 DIAGNOSIS — R109 Unspecified abdominal pain: Secondary | ICD-10-CM | POA: Diagnosis not present

## 2017-12-20 DIAGNOSIS — M549 Dorsalgia, unspecified: Secondary | ICD-10-CM | POA: Diagnosis not present

## 2017-12-20 DIAGNOSIS — F418 Other specified anxiety disorders: Secondary | ICD-10-CM | POA: Diagnosis not present

## 2017-12-20 DIAGNOSIS — N132 Hydronephrosis with renal and ureteral calculous obstruction: Secondary | ICD-10-CM | POA: Diagnosis not present

## 2017-12-20 DIAGNOSIS — Z87442 Personal history of urinary calculi: Secondary | ICD-10-CM | POA: Insufficient documentation

## 2017-12-20 HISTORY — PX: CYSTOSCOPY W/ URETERAL STENT PLACEMENT: SHX1429

## 2017-12-20 LAB — COMPREHENSIVE METABOLIC PANEL
ALK PHOS: 93 U/L (ref 38–126)
ALT: 29 U/L (ref 0–44)
AST: 25 U/L (ref 15–41)
Albumin: 4.2 g/dL (ref 3.5–5.0)
Anion gap: 11 (ref 5–15)
BILIRUBIN TOTAL: 0.5 mg/dL (ref 0.3–1.2)
BUN: 26 mg/dL — AB (ref 6–20)
CALCIUM: 9.7 mg/dL (ref 8.9–10.3)
CO2: 25 mmol/L (ref 22–32)
Chloride: 104 mmol/L (ref 98–111)
Creatinine, Ser: 0.82 mg/dL (ref 0.44–1.00)
GFR calc Af Amer: >60 mL/min (ref >60)
GLUCOSE: 131 mg/dL — AB (ref 70–99)
Potassium: 3.6 mmol/L (ref 3.5–5.1)
Sodium: 140 mmol/L (ref 135–145)
TOTAL PROTEIN: 7.4 g/dL (ref 6.5–8.1)

## 2017-12-20 LAB — CBC WITH DIFFERENTIAL/PLATELET
Abs Immature Granulocytes: 0.03 10*3/uL (ref 0.00–0.07)
BASOS ABS: 0 10*3/uL (ref 0.0–0.1)
Basophils Relative: 0 %
EOS PCT: 2 %
Eosinophils Absolute: 0.2 10*3/uL (ref 0.0–0.5)
HEMATOCRIT: 39 % (ref 36.0–46.0)
HEMOGLOBIN: 12.9 g/dL (ref 12.0–15.0)
Immature Granulocytes: 0 %
LYMPHS ABS: 2.2 10*3/uL (ref 0.7–4.0)
LYMPHS PCT: 20 %
MCH: 27.7 pg (ref 26.0–34.0)
MCHC: 33.1 g/dL (ref 30.0–36.0)
MCV: 83.7 fL (ref 80.0–100.0)
MONOS PCT: 6 %
Monocytes Absolute: 0.7 10*3/uL (ref 0.1–1.0)
Neutro Abs: 7.9 10*3/uL — ABNORMAL HIGH (ref 1.7–7.7)
Neutrophils Relative %: 72 %
Platelets: 243 10*3/uL (ref 150–400)
RBC: 4.66 MIL/uL (ref 3.87–5.11)
RDW: 12.9 % (ref 11.5–15.5)
WBC: 11 10*3/uL — ABNORMAL HIGH (ref 4.0–10.5)
nRBC: 0 % (ref 0.0–0.2)

## 2017-12-20 LAB — URINALYSIS, COMPLETE (UACMP) WITH MICROSCOPIC
BILIRUBIN URINE: NEGATIVE
Bacteria, UA: NONE SEEN
GLUCOSE, UA: NEGATIVE mg/dL
Ketones, ur: NEGATIVE mg/dL
LEUKOCYTES UA: NEGATIVE
Nitrite: POSITIVE — AB
PH: 5 (ref 5.0–8.0)
Protein, ur: NEGATIVE mg/dL
RBC / HPF: >50 RBC/hpf — ABNORMAL HIGH (ref 0–5)
Specific Gravity, Urine: 1.019 (ref 1.005–1.030)

## 2017-12-20 LAB — SURGICAL PCR SCREEN
MRSA, PCR: NEGATIVE
STAPHYLOCOCCUS AUREUS: NEGATIVE

## 2017-12-20 LAB — LIPASE, BLOOD: Lipase: 39 U/L (ref 11–51)

## 2017-12-20 SURGERY — CYSTOSCOPY, WITH RETROGRADE PYELOGRAM AND URETERAL STENT INSERTION
Anesthesia: General | Site: Ureter | Laterality: Right

## 2017-12-20 MED ORDER — NITROGLYCERIN 0.4 MG SL SUBL: 0.4000 mg | SUBLINGUAL_TABLET | SUBLINGUAL | Status: DC | PRN

## 2017-12-20 MED ORDER — ONDANSETRON HCL 4 MG/2ML IJ SOLN: 4.0000 mg | Freq: Once | INTRAMUSCULAR | Status: DC | PRN

## 2017-12-20 MED ORDER — ACETAMINOPHEN 325 MG PO TABS: 650.0000 mg | ORAL_TABLET | ORAL | Status: DC | PRN

## 2017-12-20 MED ORDER — FENTANYL CITRATE (PF) 100 MCG/2ML IJ SOLN: 25.0000 ug | INTRAMUSCULAR | Status: DC | PRN

## 2017-12-20 MED ORDER — SUGAMMADEX SODIUM 500 MG/5ML IV SOLN
INTRAVENOUS | Status: DC | PRN
  Administered 2017-12-20: 475.2 mg via INTRAVENOUS

## 2017-12-20 MED ORDER — KETOROLAC TROMETHAMINE 30 MG/ML IJ SOLN
INTRAMUSCULAR | Status: DC | PRN
  Administered 2017-12-20: 15 mg via INTRAVENOUS

## 2017-12-20 MED ORDER — LIDOCAINE HCL (CARDIAC) PF 100 MG/5ML IV SOSY
PREFILLED_SYRINGE | INTRAVENOUS | Status: DC | PRN
  Administered 2017-12-20: 100 mg via INTRAVENOUS

## 2017-12-20 MED ORDER — SUCCINYLCHOLINE CHLORIDE 20 MG/ML IJ SOLN
INTRAMUSCULAR | Status: DC | PRN
  Administered 2017-12-20: 60 mg via INTRAVENOUS

## 2017-12-20 MED ORDER — MUPIROCIN 2 % EX OINT
1.0000 "application " | TOPICAL_OINTMENT | Freq: Two times a day (BID) | CUTANEOUS | Status: DC
  Filled 2017-12-20: qty 22

## 2017-12-20 MED ORDER — SULFAMETHOXAZOLE-TRIMETHOPRIM 800-160 MG PO TABS: 1.0000 | ORAL_TABLET | Freq: Every day | ORAL | 0 refills | Status: DC

## 2017-12-20 MED ORDER — ISOSORBIDE MONONITRATE ER 30 MG PO TB24
30.0000 mg | ORAL_TABLET | Freq: Every day | ORAL | Status: DC
  Filled 2017-12-20: qty 1

## 2017-12-20 MED ORDER — KETOROLAC TROMETHAMINE 30 MG/ML IJ SOLN
INTRAMUSCULAR | Status: AC
  Administered 2017-12-20: 30 mg via INTRAVENOUS
  Filled 2017-12-20: qty 1

## 2017-12-20 MED ORDER — SODIUM CHLORIDE 0.9 % IV SOLN
1.0000 g | Freq: Once | INTRAVENOUS | Status: AC
  Administered 2017-12-20: 1 g via INTRAVENOUS
  Filled 2017-12-20: qty 10

## 2017-12-20 MED ORDER — IOTHALAMATE MEGLUMINE 43 % IV SOLN
INTRAVENOUS | Status: DC | PRN
  Administered 2017-12-20: 10 mL

## 2017-12-20 MED ORDER — ROSUVASTATIN CALCIUM 10 MG PO TABS
20.0000 mg | ORAL_TABLET | Freq: Every day | ORAL | Status: DC
  Filled 2017-12-20: qty 1

## 2017-12-20 MED ORDER — ONDANSETRON HCL 4 MG/2ML IJ SOLN
4.0000 mg | INTRAMUSCULAR | Status: DC | PRN
  Administered 2017-12-20 (×2): 4 mg via INTRAVENOUS
  Filled 2017-12-20: qty 2

## 2017-12-20 MED ORDER — SODIUM CHLORIDE 0.9 % IV SOLN
INTRAVENOUS | Status: DC
  Administered 2017-12-20: 16:00:00 via INTRAVENOUS

## 2017-12-20 MED ORDER — ONDANSETRON HCL 4 MG/2ML IJ SOLN
4.0000 mg | Freq: Once | INTRAMUSCULAR | Status: AC
  Administered 2017-12-20: 4 mg via INTRAVENOUS

## 2017-12-20 MED ORDER — TAMSULOSIN HCL 0.4 MG PO CAPS: 0.4000 mg | ORAL_CAPSULE | Freq: Every day | ORAL | 0 refills | Status: DC

## 2017-12-20 MED ORDER — PROPOFOL 10 MG/ML IV BOLUS
INTRAVENOUS | Status: DC | PRN
  Administered 2017-12-20: 200 mg via INTRAVENOUS

## 2017-12-20 MED ORDER — HYDROCODONE-ACETAMINOPHEN 5-325 MG PO TABS: 1.0000 | ORAL_TABLET | ORAL | 0 refills | Status: AC | PRN

## 2017-12-20 MED ORDER — HYDROMORPHONE HCL 1 MG/ML IJ SOLN
0.5000 mg | INTRAMUSCULAR | Status: DC | PRN
  Administered 2017-12-20: 1 mg via INTRAVENOUS
  Filled 2017-12-20: qty 1

## 2017-12-20 MED ORDER — FENTANYL CITRATE (PF) 100 MCG/2ML IJ SOLN
INTRAMUSCULAR | Status: DC | PRN
  Administered 2017-12-20: 50 ug via INTRAVENOUS

## 2017-12-20 MED ORDER — SODIUM CHLORIDE 0.9 % IV SOLN
Freq: Once | INTRAVENOUS | Status: AC
  Administered 2017-12-20: 09:00:00 via INTRAVENOUS

## 2017-12-20 MED ORDER — ESCITALOPRAM OXALATE 10 MG PO TABS
20.0000 mg | ORAL_TABLET | Freq: Every day | ORAL | Status: DC
  Filled 2017-12-20: qty 2

## 2017-12-20 MED ORDER — KETOROLAC TROMETHAMINE 30 MG/ML IJ SOLN
30.0000 mg | Freq: Once | INTRAMUSCULAR | Status: AC
  Administered 2017-12-20: 30 mg via INTRAVENOUS
  Filled 2017-12-20: qty 1

## 2017-12-20 MED ORDER — HYDROMORPHONE HCL 1 MG/ML IJ SOLN
0.5000 mg | Freq: Once | INTRAMUSCULAR | Status: AC
  Administered 2017-12-20: 0.5 mg via INTRAVENOUS

## 2017-12-20 MED ORDER — CIPROFLOXACIN IN D5W 400 MG/200ML IV SOLN
400.0000 mg | Freq: Once | INTRAVENOUS | Status: AC
  Administered 2017-12-20: 400 mg via INTRAVENOUS
  Filled 2017-12-20: qty 200

## 2017-12-20 MED ORDER — ROCURONIUM BROMIDE 100 MG/10ML IV SOLN
INTRAVENOUS | Status: DC | PRN
  Administered 2017-12-20: 30 mg via INTRAVENOUS

## 2017-12-20 MED ORDER — MIDAZOLAM HCL 2 MG/2ML IJ SOLN
INTRAMUSCULAR | Status: DC | PRN
  Administered 2017-12-20: 2 mg via INTRAVENOUS

## 2017-12-20 MED ORDER — LACTATED RINGERS IV SOLN
INTRAVENOUS | Status: DC | PRN
  Administered 2017-12-20: 17:00:00 via INTRAVENOUS

## 2017-12-20 MED ORDER — DEXAMETHASONE SODIUM PHOSPHATE 10 MG/ML IJ SOLN
INTRAMUSCULAR | Status: DC | PRN
  Administered 2017-12-20: 10 mg via INTRAVENOUS

## 2017-12-20 MED ORDER — METOPROLOL TARTRATE 25 MG PO TABS
25.0000 mg | ORAL_TABLET | Freq: Two times a day (BID) | ORAL | Status: DC
  Administered 2017-12-20: 25 mg via ORAL
  Filled 2017-12-20: qty 1

## 2017-12-20 SURGICAL SUPPLY — 25 items
ADHESIVE MASTISOL STRL (MISCELLANEOUS) ×3 IMPLANT
BAG DRAIN CYSTO-URO LG1000N (MISCELLANEOUS) ×3 IMPLANT
BRUSH SCRUB EZ  4% CHG (MISCELLANEOUS)
BRUSH SCRUB EZ 4% CHG (MISCELLANEOUS) IMPLANT
CATH URETL 5X70 OPEN END (CATHETERS) ×3 IMPLANT
CONRAY 43 FOR UROLOGY 50M (MISCELLANEOUS) ×3 IMPLANT
FIBER LASER LITHO 273 (Laser) ×3 IMPLANT
GLOVE BIOGEL PI IND STRL 7.5 (GLOVE) ×2 IMPLANT
GLOVE BIOGEL PI INDICATOR 7.5 (GLOVE) ×1
GOWN STRL REUS W/ TWL LRG LVL3 (GOWN DISPOSABLE) ×2 IMPLANT
GOWN STRL REUS W/ TWL XL LVL3 (GOWN DISPOSABLE) ×2 IMPLANT
GOWN STRL REUS W/TWL LRG LVL3 (GOWN DISPOSABLE) ×1
GOWN STRL REUS W/TWL XL LVL3 (GOWN DISPOSABLE) ×1
KIT TURNOVER CYSTO (KITS) ×3 IMPLANT
PACK CYSTO AR (MISCELLANEOUS) ×3 IMPLANT
SENSORWIRE 0.038 NOT ANGLED (WIRE) ×3
SET CYSTO W/LG BORE CLAMP LF (SET/KITS/TRAYS/PACK) ×3 IMPLANT
SOL .9 NS 3000ML IRR  AL (IV SOLUTION) ×2
SOL .9 NS 3000ML IRR UROMATIC (IV SOLUTION) ×4 IMPLANT
STENT URET 6FRX24 CONTOUR (STENTS) IMPLANT
STENT URET 6FRX26 CONTOUR (STENTS) ×3 IMPLANT
SURGILUBE 2OZ TUBE FLIPTOP (MISCELLANEOUS) ×3 IMPLANT
SYRINGE IRR TOOMEY STRL 70CC (SYRINGE) IMPLANT
WATER STERILE IRR 1000ML POUR (IV SOLUTION) ×3 IMPLANT
WIRE SENSOR 0.038 NOT ANGLED (WIRE) ×2 IMPLANT

## 2017-12-20 NOTE — Anesthesia Post-op Follow-up Note (Signed)
Anesthesia QCDR form completed.        

## 2017-12-20 NOTE — H&P (Signed)
12:42 PM   Sheri [name] [birth year] [id number]  CC: Right flank and groin pain  HPI: I saw [name] in the emergency department today for worsening of right-sided flank and groin pain, with CT demonstrating an 8 mm stone at the right ureterovesical junction.  She reports 4 to 5 weeks of intermittent right-sided sharp flank pain, acutely worsened over the last 12 hours resulting in her presenting to the emergency department this morning.  She was treated for an E. coli UTI on 11/30/2017.  At that point she had lower urinary tract symptoms, but no fevers or chills.  Her past medical history is notable for 2-3 prior spontaneously passed stones.  She also has a CAD with history of stent placement in 2016, remains on aspirin and Plavix.  She denies nausea or emesis.  Denies fevers, chills, gross hematuria, urgency, frequency.  She has not had anything to eat or drink today.   PMH: Past Medical History:  Diagnosis Date  . Abnormal uterine bleeding   . Anomaly heart   . Anxiety   . Coronary artery disease   . Dyspnea    with exertion  . Dysrhythmia   . Heart attack ([place]) 03/2014  . Heart disease   . History of C-section   . History of kidney stones   . Hx of heart artery stent   . Hypertension     Surgical History: Past Surgical History:  Procedure Laterality Date  . CARDIAC CATHETERIZATION    . CARDIAC SURGERY    . [place]  . CHOLECYSTECTOMY    . CORONARY ANGIOPLASTY    . CYSTOSCOPY  10/10/2016   Procedure: CYSTOSCOPY;  Surgeon: Will Bonnet, MD;  Location: [facility];  Service: Gynecology;;  . Consuela Mimes WITH STENT PLACEMENT Left 11/01/2015   Procedure: CYSTOSCOPY WITH STENT PLACEMENT;  Surgeon: Hollice Espy, MD;  Location: [facility];  Service: Urology;  Laterality: Left;  . CYSTOSCOPY/RETROGRADE/URETEROSCOPY Bilateral 11/01/2015   Procedure: CYSTOSCOPY/RETROGRADE/URETEROSCOPY;  Surgeon: Hollice Espy, MD;  Location: [facility];  Service:  Urology;  Laterality: Bilateral;  . DILATION AND CURETTAGE OF UTERUS  2011  . ENDOMETRIAL ABLATION  2011  . LAPAROSCOPIC HYSTERECTOMY Bilateral 10/10/2016   Procedure: HYSTERECTOMY TOTAL LAPAROSCOPIC BILATERAL SALPINGECTOMY;  Surgeon: Will Bonnet, MD;  Location: [facility];  Service: Gynecology;  Laterality: Bilateral;  . LEFT HEART CATHETERIZATION WITH CORONARY ANGIOGRAM N/A 04/06/2014   Procedure: LEFT HEART CATHETERIZATION WITH CORONARY ANGIOGRAM;  Surgeon: Jettie Booze, MD;  Location: [facility] CATH LAB;  Service: Cardiovascular;  Laterality: N/A;    Allergies:  Allergies  Allergen Reactions  . Latex Dermatitis  . Penicillins Rash    Has patient had a PCN reaction causing immediate rash, facial/tongue/throat swelling, SOB or lightheadedness with hypotension: Yes Has patient had a PCN reaction causing severe rash involving mucus membranes or skin necrosis: Unknown Has patient had a PCN reaction that required hospitalization: No Has patient had a PCN reaction occurring within the last 10 years: No If all of the above answers are "NO", then may proceed with Cephalosporin use.     Family History: Family History  Problem Relation Age of Onset  . Heart attack Father 78       died of MI at age 54  . High blood pressure Father   . High Cholesterol Father   . Heart disease Father   . Sudden death Father   . Hypertension Mother   . Hyperlipidemia Mother   .  Hypothyroidism Mother   . Kidney Stones Sister   . Kidney Stones Brother   . Stroke Paternal Grandfather   . Liver cancer Maternal Grandmother   . Kidney disease Neg Hx   . Bladder Cancer Neg Hx     Social History:  reports that she has quit smoking. Her smoking use included cigarettes. She smoked 1.00 pack per day. She has quit using smokeless tobacco. She reports that she does not drink alcohol or use drugs.  ROS: Please see flowsheet from today's date for complete review of systems.  Physical Exam: BP 119/64 (BP  Location: Right Arm)   Pulse 68   Temp 98.6 F (37 C) (Oral)   Resp 18   Ht _0  (1.651 m)   Wt 118.8 kg   LMP 09/13/2016   SpO2 98%   BMI 43.58 kg/m    Constitutional:  Alert and oriented, No acute distress.  Obese Cardiovascular: Regular rate and rhythm Respiratory: Clear to auscultation bilaterally GI: Abdomen is soft, nontender, nondistended, no abdominal masses GU: Right CVA tenderness Lymph: No cervical or inguinal lymphadenopathy. Skin: No rashes, bruises or suspicious lesions. Neurologic: Grossly intact, no focal deficits, moving all 4 extremities. Psychiatric: Normal mood and affect.  Laboratory Data: Reviewed  Creatinine 0.82, EGFR greater than 60 WBC 11  Urinalysis today 6-10 WBCs, greater than 50 RBCs, 0-5 epithelial cells, no bacteria  Pertinent Imaging: I have personally reviewed the CT stone protocol today: 8 mm distal right ureteral stone with upstream hydronephrosis.  1 cm nonobstructing left lower pole renal calculus.  Assessment & Plan:   In summary, Ms. Budde is a 52 year old female with cardiac history on Plavix and aspirin with a noninfected right 8 mm distal ureteral stone.  She has been symptomatic for over a month, and her pain is acutely worsened over the last 12 hours.  We discussed various treatment options for urolithiasis including observation with or without medical expulsive therapy, shockwave lithotripsy (SWL), ureteroscopy and laser lithotripsy with stent placement, and percutaneous nephrolithotomy.  We discussed that management is based on stone size, location, density, patient co-morbidities, and patient preference.   Stones <67m in size have a >80% spontaneous passage rate. Data surrounding the use of tamsulosin for medical expulsive therapy is controversial, but meta analyses suggests it is most efficacious for distal stones between 5-128min size. Possible side effects include dizziness/lightheadedness, and retrograde ejaculation.  SWL  has a lower stone free rate in a single procedure, but also a lower complication rate compared to ureteroscopy and avoids a stent and associated stent related symptoms. Possible complications include renal hematoma, steinstrasse, and need for additional treatment.  She is not a candidate for SWL secondary to her anticoagulation.  Ureteroscopy with laser lithotripsy and stent placement has a higher stone free rate than SWL in a single procedure, however increased complication rate including possible infection, ureteral injury, bleeding, and stent related morbidity. Common stent related symptoms include dysuria, urgency/frequency, and flank pain.  We also discussed the possibility of temporary stent placement and need for staged procedure in the setting of possible infection or inability to access the stone with the ureteroscope.  After an extensive discussion of the risks and benefits of the above treatment options, the patient would like to proceed with cystoscopy, right ureteroscopy, laser lithotripsy, stent placement.  Informed consent obtained, right side marked.   BrBilley CoMDFronton Ranchettesrological Associates [id number] [id number]

## 2017-12-20 NOTE — Anesthesia Procedure Notes (Signed)
Procedure Name: Intubation Date/Time: 12/20/2017 5:52 PM Performed by: Nelda Marseille, CRNA Pre-anesthesia Checklist: Patient identified, Patient being monitored, Timeout performed, Emergency Drugs available and Suction available Patient Re-evaluated:Patient Re-evaluated prior to induction Oxygen Delivery Method: Circle system utilized Preoxygenation: Pre-oxygenation with 100% oxygen Induction Type: IV induction Ventilation: Mask ventilation without difficulty Laryngoscope Size: Mac, 3 and McGraph Grade View: Grade IV Tube type: Oral Tube size: 7.0 mm Number of attempts: 1 Airway Equipment and Method: Stylet Placement Confirmation: ETT inserted through vocal cords under direct vision,  positive ETCO2 and breath sounds checked- equal and bilateral Secured at: 21 cm Tube secured with: Tape Dental Injury: Teeth and Oropharynx as per pre-operative assessment  Difficulty Due To: Difficulty was unanticipated and Difficult Airway- due to limited oral opening

## 2017-12-20 NOTE — Progress Notes (Signed)
Discharge instructions reviewed with patient, acknowledged understanding, packet given to patient; Daughter already picked up medications earlier from [facility]; voiced no concerns or distress at this time; VSS; Tyshawn Keel K, RN,8:09 PM 12/20/2017

## 2017-12-20 NOTE — Op Note (Signed)
Date of procedure: 12/20/17  Preoperative diagnosis:  1. Right distal ureteral stone  Postoperative diagnosis:  1. Same  Procedure: 1. Cystoscopy, right ureteroscopy, laser lithotripsy, retrograde pyelogram with intraoperative interpretation, stent placement  Surgeon: Nickolas Madrid, MD  Anesthesia: General  Complications: None  Intraoperative findings:  1.  Uncomplicated ureteroscopy and laser lithotripsy of a 7 mm right distal ureteral stone  EBL: None  Specimens: None  Drains: Right 6 French by 26 cm ureteral stent on Dangler  Indication: Sheri [name] is a 52 y.o. patient with right-sided flank pain for greater than 4 weeks with CT showing a 7 mm right distal ureteral stone.  Her pain was poorly controlled in the emergency department and she elected for definitive management.  After reviewing the management options for treatment, they elected to proceed with the above surgical procedure(s). We have discussed the potential benefits and risks of the procedure, side effects of the proposed treatment, the likelihood of the patient achieving the goals of the procedure, and any potential problems that might occur during the procedure or recuperation. Informed consent has been obtained.  Description of procedure:  The patient was taken to the operating room and general anesthesia was induced.  The patient was placed in the dorsal lithotomy position, prepped and draped in the usual sterile fashion, and preoperative antibiotics(Cipro) were administered. A preoperative time-out was performed.   A 21 French rigid cystoscope with a 30 degree lens was used to intubate the urethra.  Inspection of the bladder demonstrated no concerning lesions.  The ureteral orifices were orthotopic bilaterally.  We turned our attention to the right ureteral orifice and with the aid of an access catheter passed a sensor wire alongside the stone up to the renal pelvis under fluoroscopic vision.  The semirigid  ureteroscope was then advanced alongside the wire and we immediately identified a yellow crystalline stone within the distal ureter.  This was fragmented to dust using the 270 m laser fiber on settings of 0.5 J and 20 Hz.  Thorough ureteroscopy demonstrated no residual fragments in the ureter.  10 cc of contrast were injected and showed no filling defects and opacified the collecting system to aid in stent placement.  There was some mild edema in the distal ureter and I elected to leave a stent.  A 6 French by 26 cm ureteral stent was advanced over the wire under fluoroscopic vision with an excellent curl noted in the renal pelvis as well as in the bladder.  The sheath was used to drain the bladder.  The Dangler attached to the stent was securely attached to the right inner thigh using Tegaderm.  Disposition: Stable to PACU  Plan: Discharge home tonight Remove stent at home on Tuesday 11/26 Prophylactic Bactrim while stent in  Nickolas Madrid, MD

## 2017-12-20 NOTE — ED Notes (Signed)
Patient transported to room 21

## 2017-12-20 NOTE — ED Provider Notes (Addendum)
[facility] Emergency Department Provider Note       Time seen: ----------------------------------------- 7:14 AM on 12/20/2017 -----------------------------------------   I have reviewed the triage vital signs and the nursing notes.  HISTORY   Chief Complaint Flank Pain   HPI Sheri [name] is a 52 y.o. female with a history of coronary artery disease, kidney stones, hypertension who presents to the ED for right flank pain that radiates into the right abdomen.  Patient reports the symptoms are accompanied by nausea since last night, she does have a history of kidney stones.  She saw her urologist on November 1 but felt like she passed that stone.  She denies fevers, chills or other complaints.  Past Medical History:  Diagnosis Date  . Abnormal uterine bleeding   . Anomaly heart   . Anxiety   . Coronary artery disease   . Dyspnea    with exertion  . Dysrhythmia   . Heart attack ([place]) 03/2014  . Heart disease   . History of C-section   . History of kidney stones   . Hx of heart artery stent   . Hypertension     Patient Active Problem List   Diagnosis Date Noted  . Prediabetes 07/04/2017  . Essential hypertension 07/04/2017  . Status post laparoscopic hysterectomy 10/10/2016  . Vitamin D deficiency 09/21/2016  . Chronic fatigue 09/21/2016  . Acquired hypothyroidism 09/21/2016  . Menorrhagia with irregular cycle 08/29/2016  . Old MI (myocardial infarction) 05/03/2016  . Nocturnal leg cramps 12/30/2015  . Right nephrolithiasis 12/01/2015  . Pulmonary nodule 12/01/2015  . Anxiety and depression 06/21/2015  . Plantar fasciitis, bilateral 06/21/2015  . Gross hematuria 10/04/2014  . Urinary frequency 10/04/2014  . Morbid obesity with BMI of 40.0-44.9, adult ([place]) 07/06/2014  . Coronary arteriosclerosis 04/21/2014  . Hyperlipidemia 04/21/2014  . History of ST elevation myocardial infarction (STEMI) 04/06/2014    Past Surgical History:   Procedure Laterality Date  . CARDIAC CATHETERIZATION    . CARDIAC SURGERY    . [place]  . CHOLECYSTECTOMY    . CORONARY ANGIOPLASTY    . CYSTOSCOPY  10/10/2016   Procedure: CYSTOSCOPY;  Surgeon: Will Bonnet, MD;  Location: [facility] ORS;  Service: Gynecology;;  . Consuela Mimes WITH STENT PLACEMENT Left 11/01/2015   Procedure: CYSTOSCOPY WITH STENT PLACEMENT;  Surgeon: Hollice Espy, MD;  Location: [facility] ORS;  Service: Urology;  Laterality: Left;  . CYSTOSCOPY/RETROGRADE/URETEROSCOPY Bilateral 11/01/2015   Procedure: CYSTOSCOPY/RETROGRADE/URETEROSCOPY;  Surgeon: Hollice Espy, MD;  Location: [facility] ORS;  Service: Urology;  Laterality: Bilateral;  . DILATION AND CURETTAGE OF UTERUS  2011  . ENDOMETRIAL ABLATION  2011  . LAPAROSCOPIC HYSTERECTOMY Bilateral 10/10/2016   Procedure: HYSTERECTOMY TOTAL LAPAROSCOPIC BILATERAL SALPINGECTOMY;  Surgeon: Will Bonnet, MD;  Location: [facility] ORS;  Service: Gynecology;  Laterality: Bilateral;  . LEFT HEART CATHETERIZATION WITH CORONARY ANGIOGRAM N/A 04/06/2014   Procedure: LEFT HEART CATHETERIZATION WITH CORONARY ANGIOGRAM;  Surgeon: Jettie Booze, MD;  Location: [facility] CATH LAB;  Service: Cardiovascular;  Laterality: N/A;    Allergies Latex and Penicillins  Social History Social History   Tobacco Use  . Smoking status: Former Smoker    Packs/day: 1.00    Types: Cigarettes  . Smokeless tobacco: Former Systems developer  . Tobacco comment: quit 20 years  Substance Use Topics  . Alcohol use: No    Alcohol/week: 0.0 standard drinks  . Drug use: No   Review of Systems Constitutional: Negative for  fever. Cardiovascular: Negative for chest pain. Respiratory: Negative for shortness of breath. Gastrointestinal: Positive for abdominal pain, nausea Genitourinary: Negative for dysuria. Musculoskeletal: Positive for right-sided back pain Skin: Negative for rash. Neurological: Negative for headaches, focal weakness or  numbness.  All systems negative/normal/unremarkable except as stated in the HPI  ____________________________________________   PHYSICAL EXAM:  VITAL SIGNS: ED Triage Vitals  Enc Vitals Group     BP 12/20/17 0651 140/75     Pulse Rate 12/20/17 0651 87     Resp 12/20/17 0651 18     Temp 12/20/17 0651 (!) 97.4 F (36.3 C)     Temp Source 12/20/17 0651 Oral     SpO2 12/20/17 0651 97 %     Weight 12/20/17 0648 261 lb 14.5 oz (118.8 kg)     Height 12/20/17 0648 5\' 5"  (1.651 m)     Head Circumference --      Peak Flow --      Pain Score 12/20/17 0648 7     Pain Loc --      Pain Edu? --      Excl. in [place]? --    Constitutional: Alert and oriented.  Obese, no distress Eyes: Conjunctivae are normal. Normal extraocular movements. ENT   Head: Normocephalic and atraumatic.   Nose: No congestion/rhinnorhea.   Mouth/Throat: Mucous membranes are moist.   Neck: No stridor. Cardiovascular: Normal rate, regular rhythm. No murmurs, rubs, or gallops. Respiratory: Normal respiratory effort without tachypnea nor retractions. Breath sounds are clear and equal bilaterally. No wheezes/rales/rhonchi. Gastrointestinal: Right flank and epigastric tenderness, no rebound or guarding.  Normal bowel sounds. Musculoskeletal: Nontender with normal range of motion in extremities. No lower extremity tenderness nor edema. Neurologic:  Normal speech and language. No gross focal neurologic deficits are appreciated.  Skin:  Skin is warm, dry and intact. No rash noted. Psychiatric: Mood and affect are normal. Speech and behavior are normal.  ____________________________________________  ED COURSE:  As part of my medical decision making, I reviewed the following data within the [place] History obtained from family if available, nursing notes, old chart and ekg, as well as notes from prior ED visits. Patient presented for flank pain, we will assess with labs and imaging as indicated  at this time.  EKG: Interpreted by me, sinus rhythm with a rate of 80 bpm, low voltage, borderline T wave abnormalities, long QT interval   Procedures ____________________________________________   LABS (pertinent positives/negatives)  Labs Reviewed  CBC WITH DIFFERENTIAL/PLATELET - Abnormal; Notable for the following components:      Result Value   WBC 11.0 (*)    Neutro Abs 7.9 (*)    All other components within normal limits  COMPREHENSIVE METABOLIC PANEL - Abnormal; Notable for the following components:   Glucose, Bld 131 (*)    BUN 26 (*)    All other components within normal limits  LIPASE, BLOOD  URINALYSIS, COMPLETE (UACMP) WITH MICROSCOPIC    RADIOLOGY Images were viewed by me  CT renal protocol IMPRESSION: 1. 8 mm calculus in the distal third of the right ureter at or immediately before the right ureterovesicular junction with moderate proximal right hydroureteronephrosis. 2. 10 mm nonobstructive calculus in the lower pole collecting system of left kidney. 3. Aortic atherosclerosis. 4. Additional incidental findings, as above. ____________________________________________  DIFFERENTIAL DIAGNOSIS   Renal colic, UTI, pyelonephritis, choledocholithiasis, shingles  FINAL ASSESSMENT AND PLAN  Renal colic   Plan: The patient had presented for acute onset right flank pain.  Patient's labs were unremarkable. Patient's imaging did reveal a large distal right ureteral stone.  I discussed with urology who has agreed to admit the patient in the hospital for possible stenting.   Laurence Aly, MD   Note: This note was generated in part or whole with voice recognition software. Voice recognition is usually quite accurate but there are transcription errors that can and very often do occur. I apologize for any typographical errors that were not detected and corrected.     Earleen Newport, MD 12/20/17 1030    Earleen Newport, MD 12/20/17 1049

## 2017-12-20 NOTE — Discharge Summary (Signed)
Physician Discharge Summary  Patient ID: Sheri [name] MRN: [id number] DOB/AGE: 09-02-1965 52 y.o.  Admit date: 12/20/2017 Discharge date: 12/20/2017  Admission Diagnoses:  Discharge Diagnoses:  Active Problems:   Right ureteral stone   Discharged Condition: good  Hospital Course:  Admitted for noninfected right ureteral stone with ongoing flank pain.  She underwent uncomplicated right ureteroscopy, laser lithotripsy, and stent placement on 11/21 and was discharged home in good condition.  Consults: None  Significant Diagnostic Studies: None  Treatments: surgery: Right ureteroscopy, laser lithotripsy, stent placement  Discharge Exam: Blood pressure (!) 150/90, pulse 83, temperature (!) 96.3 F (35.7 C), resp. rate 13, height 5\' 5"  (1.651 m), weight 118.8 kg, last menstrual period 09/13/2016, SpO2 99 %.  Alert, no acute distress Abdomen soft nontender, no CVA tenderness  Disposition:   Discharge Instructions    Discharge instructions   Complete by:  As directed    You have a ureteral stent in place.  This is a tube that extends from your kidney to your bladder.  This may cause urinary bleeding, burning with urination, and urinary frequency.  Please call our office or present to the ED if you develop fevers >101 or pain which is not able to be controlled with oral pain medications.    Your stent is attached to a string. On Tuesday morning 11/26, remove your stent by pulling the string until the entire stent is out.   Call the clinic with any problems or concerns.  [facility] [address], [place], [place] [address] [phone]        Signed: Billey Co 12/20/2017, 6:39 PM

## 2017-12-20 NOTE — ED Triage Notes (Signed)
Patient ambulatory to triage with steady gait, without difficulty or distress noted; pt reports right flank pain radiating into abd accomp by nausea since last night; st hx kidney stones

## 2017-12-20 NOTE — Transfer of Care (Signed)
Immediate Anesthesia Transfer of Care Note  Patient: Sheri [name] [place]  Procedure(s) Performed: CYSTOSCOPY WITH RETROGRADE PYELOGRAM/URETERAL STENT PLACEMENT (Right Ureter)  Patient Location: PACU  Anesthesia Type:General  Level of Consciousness: awake, alert  and oriented  Airway & Oxygen Therapy: Patient Spontanous Breathing and Patient connected to face mask oxygen  Post-op Assessment: Report given to RN and Post -op Vital signs reviewed and stable  Post vital signs: Reviewed and stable  Last Vitals:  Vitals Value Taken Time  BP 150/90 12/20/2017  6:26 PM  Temp    Pulse 82 12/20/2017  6:27 PM  Resp 18 12/20/2017  6:27 PM  SpO2 98 % 12/20/2017  6:27 PM  Vitals shown include unvalidated device data.  Last Pain:  Vitals:   12/20/17 1603  TempSrc:   PainSc: 4       Patients Stated Pain Goal: 1 (22/57/50 5183)  Complications: No apparent anesthesia complications

## 2017-12-20 NOTE — Anesthesia Preprocedure Evaluation (Signed)
Anesthesia Evaluation  Patient identified by MRN, date of birth, ID band Patient awake    Reviewed: Allergy & Precautions, H&P , NPO status , Patient's Chart, lab work & pertinent test results, reviewed documented beta blocker date and time   Airway Mallampati: II  TM Distance: >3 FB Neck ROM: full    Dental  (+) Teeth Intact   Pulmonary neg pulmonary ROS, shortness of breath, former smoker,    Pulmonary exam normal        Cardiovascular Exercise Tolerance: Poor hypertension, On Medications + CAD and + Past MI  negative cardio ROS Normal cardiovascular exam+ dysrhythmias  Rate:Normal     Neuro/Psych PSYCHIATRIC DISORDERS Anxiety Depression negative neurological ROS  negative psych ROS   GI/Hepatic negative GI ROS, Neg liver ROS,   Endo/Other  negative endocrine ROSHypothyroidism   Renal/GU Renal diseasenegative Renal ROS  negative genitourinary   Musculoskeletal   Abdominal   Peds  Hematology negative hematology ROS (+)   Anesthesia Other Findings   Reproductive/Obstetrics negative OB ROS                             Anesthesia Physical Anesthesia Plan  ASA: III and emergent  Anesthesia Plan: General LMA   Post-op Pain Management:    Induction:   PONV Risk Score and Plan:   Airway Management Planned:   Additional Equipment:   Intra-op Plan:   Post-operative Plan:   Informed Consent: I have reviewed the patients History and Physical, chart, labs and discussed the procedure including the risks, benefits and alternatives for the proposed anesthesia with the patient or authorized representative who has indicated his/her understanding and acceptance.     Plan Discussed with: CRNA  Anesthesia Plan Comments:         Anesthesia Quick Evaluation

## 2017-12-20 NOTE — ED Notes (Signed)
Patient transported to room 219 

## 2017-12-21 ENCOUNTER — Encounter: Payer: Self-pay | Admitting: Urology

## 2017-12-21 ENCOUNTER — Telehealth: Payer: Self-pay | Admitting: Urology

## 2017-12-21 LAB — URINE CULTURE: Culture: NO GROWTH

## 2017-12-21 MED ORDER — OXYBUTYNIN CHLORIDE 5 MG PO TABS: 5.0000 mg | ORAL_TABLET | Freq: Three times a day (TID) | ORAL | 1 refills | Status: DC

## 2017-12-21 NOTE — Telephone Encounter (Signed)
Pt lmom stating she has been recently admitted to hospital from ER.  Pt had surgery, pt has questions for the clinical staff. Pt did not give specific details other than she has questions for clinical staff and wants to know if she should keep her New Pt appt on Tues. Please advise pt at [phone]. Thanks

## 2017-12-21 NOTE — Telephone Encounter (Signed)
Spoke with patient she is having very bad bladder spasms and leakage due to her stent. She is utlizing flomax and pain medication with out relief. A script for oxybutinin TID was sent to her pharmacy to utilize and help with spasm discomfort. Patient verbalized understanding

## 2017-12-24 ENCOUNTER — Encounter: Payer: Self-pay | Admitting: Urology

## 2017-12-24 NOTE — Anesthesia Postprocedure Evaluation (Signed)
Anesthesia Post Note  Patient: Camelle Henkels [place]  Procedure(s) Performed: CYSTOSCOPY WITH RETROGRADE PYELOGRAM/URETERAL STENT PLACEMENT (Right Ureter)  Patient location during evaluation: PACU Anesthesia Type: General Level of consciousness: awake and alert Pain management: pain level controlled Vital Signs Assessment: post-procedure vital signs reviewed and stable Respiratory status: spontaneous breathing, nonlabored ventilation, respiratory function stable and patient connected to nasal cannula oxygen Cardiovascular status: blood pressure returned to baseline and stable Postop Assessment: no apparent nausea or vomiting Anesthetic complications: no     Last Vitals:  Vitals:   12/20/17 1910 12/20/17 2000  BP: 138/86 122/76  Pulse: 71 70  Resp: (!) 23 18  Temp: (!) 36.4 C 36.6 C  SpO2: 100% 95%    Last Pain:  Vitals:   12/20/17 2000  TempSrc: Oral  PainSc:                  Molli Barrows

## 2017-12-25 ENCOUNTER — Ambulatory Visit: Payer: BLUE CROSS/BLUE SHIELD | Admitting: Urology

## 2018-01-11 ENCOUNTER — Other Ambulatory Visit: Payer: Self-pay | Admitting: Family Medicine

## 2018-01-11 NOTE — Telephone Encounter (Signed)
Agree. She should get longer term rx from Urologist. I have filled it in past short term if she had kidney stone.  Sheri [name], [facility] 01/11/2018, 12:01 PM

## 2018-01-11 NOTE — Telephone Encounter (Signed)
Called patient and LM for her to call back.  Her Urologist is the original prescriber for this medication.

## 2018-01-18 ENCOUNTER — Telehealth: Payer: Self-pay

## 2018-01-18 NOTE — Telephone Encounter (Signed)
Refill request sent from [facility] for:  Tamsulosin HCL .4mg  # 30 take 1 cap daily  Last filled 12/20/17

## 2018-01-18 NOTE — Telephone Encounter (Signed)
Patient's Rx was send by urology and she still has 2 bottle left she doesn't need refill and advised her to call urology for further refill if she will needed.

## 2018-02-07 ENCOUNTER — Ambulatory Visit: Payer: BLUE CROSS/BLUE SHIELD | Admitting: Urology

## 2018-02-07 ENCOUNTER — Ambulatory Visit
Admission: RE | Admit: 2018-02-07 | Discharge: 2018-02-07 | Disposition: A | Discharge status: Home / Self Care | Payer: BLUE CROSS/BLUE SHIELD | Source: Ambulatory Visit | Attending: Urology | Admitting: Urology

## 2018-02-07 ENCOUNTER — Other Ambulatory Visit: Payer: Self-pay

## 2018-02-07 ENCOUNTER — Encounter: Payer: Self-pay | Admitting: Urology

## 2018-02-07 VITALS — BP 126/76 | HR 73 | Ht 65.0 in | Wt 255.0 lb

## 2018-02-07 DIAGNOSIS — N200 Calculus of kidney: Secondary | ICD-10-CM

## 2018-02-07 DIAGNOSIS — R109 Unspecified abdominal pain: Secondary | ICD-10-CM

## 2018-02-07 LAB — URINALYSIS, COMPLETE
Bilirubin, UA: NEGATIVE
Glucose, UA: NEGATIVE
Ketones, UA: NEGATIVE
NITRITE UA: NEGATIVE
Protein, UA: NEGATIVE
Urobilinogen, Ur: 0.2 mg/dL (ref 0.2–1.0)
pH, UA: 5.5 (ref 5.0–7.5)

## 2018-02-07 LAB — MICROSCOPIC EXAMINATION: RBC MICROSCOPIC, UA: NONE SEEN /HPF (ref 0–2)

## 2018-02-07 MED ORDER — SULFAMETHOXAZOLE-TRIMETHOPRIM 800-160 MG PO TABS: 1.0000 | ORAL_TABLET | Freq: Two times a day (BID) | ORAL | 0 refills | Status: DC

## 2018-02-07 NOTE — Progress Notes (Signed)
   02/07/2018 3:58 PM   Sheri [name] 10/15/1965 [id number]  Reason for visit: Dysuria, urgency, groin pain  HPI: I saw Sheri [name] in urology clinic today as an add-on for dysuria, urgency, and groin pain.  She reports this is been present for the last 48 hours.  She feels it similar to when she has had a stone in the past.  She did undergo right ureteroscopy with me on 12/20/2017 for a 63mm distal right ureteral stone.  She denies any fevers, chills, or flank pain.  There are no aggravating or alleviating factors.  Severity is mild to moderate.   ROS: Please see flowsheet from today's date for complete review of systems.  Physical Exam: BP 126/76 (BP Location: Left Arm, Patient Position: Sitting)   Pulse 73   Ht 5\' 5"  (1.651 m)   Wt 255 lb (115.7 kg)   LMP 09/13/2016   BMI 42.43 kg/m    Constitutional:  Alert and oriented, No acute distress. Respiratory: Normal respiratory effort, no increased work of breathing. GI: Abdomen is soft, nontender, nondistended, no abdominal masses GU: No CVA tenderness Skin: No rashes, bruises or suspicious lesions. Neurologic: Grossly intact, no focal deficits, moving all 4 extremities. Psychiatric: Normal mood and affect  Laboratory Data:  Urinalysis today 6-10 WBCs, 0 RBCs, few bacteria, nitrite negative  Pertinent Imaging:  I have personally reviewed the KUB today, no calcifications over the projected course of the ureters bilaterally, left 1 cm lower pole stone is stable.  Assessment & Plan:   In summary, Sheri [name] is a 53 year old female with history of nephrolithiasis, as well as right ureteroscopy on 12/20/2017 who presents with 48 hours of dysuria, urgency, and groin pain concerning for possible ureteral stone versus UTI versus obstruction/scarring after ureteroscopy.  She has no calcifications over the course of the ureters on KUB today.  Her pain is well controlled and she is clinically well-appearing.  Urinalysis does not have any red  cells, but there are 6-10 whites and few bacteria.  We will treat as possible UTI, send urine for culture Bactrim DS twice daily x3 days Follow-up next week if persistent symptoms for renal ultrasound  A total of 10 minutes were spent face-to-face with the patient, greater than 50% was spent in patient education, counseling, and coordination of care regarding urinary tract infection, nephrolithiasis, and treatment with short course of antibiotics.   Billey Co, [facility] [address], [place], [place] [address] [phone]

## 2018-02-11 ENCOUNTER — Telehealth: Payer: Self-pay | Admitting: Family Medicine

## 2018-02-11 LAB — CULTURE, URINE COMPREHENSIVE

## 2018-02-11 NOTE — Telephone Encounter (Signed)
-----   Message from Billey Co, MD sent at 02/11/2018 12:51 PM EST ----- Her urine culture only grew out a small amount of bacteria, unlikely to be a UTI. If her symptoms of pelvic pain have resolved, no need for additional treatment. However, if persistent urinary symptoms or pain, can try a 3 day course of amoxicillin 500mg  BID x 3 days.  Nickolas Madrid, MD 02/11/2018

## 2018-02-11 NOTE — Telephone Encounter (Signed)
LMOM for patient to return call.

## 2018-02-12 NOTE — Telephone Encounter (Signed)
mychart sent.

## 2018-02-13 ENCOUNTER — Other Ambulatory Visit: Payer: Self-pay

## 2018-02-13 MED ORDER — OXYBUTYNIN CHLORIDE 5 MG PO TABS: 5.0000 mg | ORAL_TABLET | Freq: Three times a day (TID) | ORAL | 1 refills | Status: DC

## 2018-03-15 ENCOUNTER — Other Ambulatory Visit: Payer: Self-pay | Admitting: Family Medicine

## 2018-03-15 DIAGNOSIS — E039 Hypothyroidism, unspecified: Secondary | ICD-10-CM

## 2018-04-11 ENCOUNTER — Other Ambulatory Visit: Payer: Self-pay

## 2018-04-11 DIAGNOSIS — R7303 Prediabetes: Secondary | ICD-10-CM

## 2018-04-11 NOTE — Telephone Encounter (Signed)
Pharmacy sent a fax requesting refills on metformin. Please review. Thanks!

## 2018-04-12 ENCOUNTER — Other Ambulatory Visit: Payer: Self-pay | Admitting: Family Medicine

## 2018-04-12 ENCOUNTER — Ambulatory Visit: Payer: BLUE CROSS/BLUE SHIELD | Admitting: Family Medicine

## 2018-04-12 DIAGNOSIS — R7303 Prediabetes: Secondary | ICD-10-CM

## 2018-04-12 MED ORDER — METFORMIN HCL 500 MG PO TABS: 500.0000 mg | ORAL_TABLET | Freq: Every day | ORAL | 1 refills | Status: DC

## 2018-04-29 ENCOUNTER — Ambulatory Visit
Admission: RE | Admit: 2018-04-29 | Discharge: 2018-04-29 | Disposition: A | Discharge status: Home / Self Care | Payer: BLUE CROSS/BLUE SHIELD | Source: Ambulatory Visit | Attending: Urology | Admitting: Urology

## 2018-04-29 ENCOUNTER — Ambulatory Visit (INDEPENDENT_AMBULATORY_CARE_PROVIDER_SITE_OTHER): Payer: BLUE CROSS/BLUE SHIELD | Admitting: Urology

## 2018-04-29 ENCOUNTER — Ambulatory Visit
Admission: RE | Admit: 2018-04-29 | Discharge: 2018-04-29 | Disposition: A | Discharge status: Home / Self Care | Payer: BLUE CROSS/BLUE SHIELD | Attending: Urology | Admitting: Urology

## 2018-04-29 ENCOUNTER — Other Ambulatory Visit: Payer: Self-pay

## 2018-04-29 ENCOUNTER — Encounter: Payer: Self-pay | Admitting: Urology

## 2018-04-29 VITALS — BP 117/78 | HR 89 | Ht 65.0 in | Wt 260.0 lb

## 2018-04-29 DIAGNOSIS — R109 Unspecified abdominal pain: Secondary | ICD-10-CM

## 2018-04-29 DIAGNOSIS — R1032 Left lower quadrant pain: Secondary | ICD-10-CM

## 2018-04-29 DIAGNOSIS — N200 Calculus of kidney: Secondary | ICD-10-CM

## 2018-04-29 LAB — URINALYSIS, COMPLETE
Bilirubin, UA: NEGATIVE
GLUCOSE, UA: NEGATIVE
Ketones, UA: NEGATIVE
LEUKOCYTES UA: NEGATIVE
NITRITE UA: NEGATIVE
PROTEIN UA: NEGATIVE
Urobilinogen, Ur: 0.2 mg/dL (ref 0.2–1.0)
pH, UA: 5.5 (ref 5.0–7.5)

## 2018-04-29 LAB — MICROSCOPIC EXAMINATION

## 2018-04-29 MED ORDER — TAMSULOSIN HCL 0.4 MG PO CAPS: 0.4000 mg | ORAL_CAPSULE | Freq: Every day | ORAL | 3 refills | Status: DC

## 2018-04-29 MED ORDER — ONDANSETRON 4 MG PO TBDP: 4.0000 mg | ORAL_TABLET | Freq: Three times a day (TID) | ORAL | 0 refills | Status: DC | PRN

## 2018-04-29 MED ORDER — HYDROCODONE-ACETAMINOPHEN 5-325 MG PO TABS: 1.0000 | ORAL_TABLET | Freq: Four times a day (QID) | ORAL | 0 refills | Status: DC | PRN

## 2018-04-29 NOTE — Addendum Note (Signed)
Addended by: Billey Co on: 04/29/2018 01:54 PM   Modules accepted: Orders

## 2018-04-29 NOTE — Progress Notes (Signed)
04/29/2018 3:26 PM   Sheri [name] [birth year] [id number]  Referring provider: Olin Hauser, DO [address] [place], [place] [address]  Chief Complaint  Patient presents with  . Nephrolithiasis    HPI: Patient is a 53 year old Caucasian female with a history of nephrolithiasis who presents today requesting an urgent appointment.  She has a history of nephrolithiasis.  CT Renal stone study in 12/2014 noted normal adrenal glands. Punctate left renal collecting system calculi. Moderate left-sided hydroureteronephrosis to the level of a 4 mm left ureterovesicular junction stone.     CT Renal stone study scan at [facility] [place] in 10/2015 noted noted a 12 mm calcification extending into the bladder at the right UVJ and a 2 mm calcification in the distal right ureter at the level of the acetabulum that was approximately 2 mm in size.    On 11/01/2015 underwent left ureteral stent placement for left sided pain.  No stones found.    RUS in 10/2015 was unremarkable.    CT renal stone study in 12/2015 noted 2 mm left kidney lower pole nonobstructive calculus, image 68/602. No hydronephrosis or other urinary tract calculi identified. Adrenal glands normal.  CT renal stone study in 11/2017 noted in the distal third of the right ureter at or immediately before the level of the right ureterovesicular junction there is an 8 mm calculus (axial image 83 of series 2) which is associated with moderate proximal right hydroureteronephrosis. In addition, in the lower pole collecting system of the left kidney there is a 10 mm nonobstructive calculus.  No additional calculi are noted along the course of the left ureter or within the lumen of the urinary bladder. Unenhanced appearance of the kidneys, bilateral adrenal glands and urinary bladder is otherwise unremarkable.  On 12/20/2017 underwent right URS.    KUB on 02/07/2018 noted stable left nephrolithiasis.  She states that last  week, she had been having a mild pain on and off in her left lower quadrant.  Then this morning, she has intense pain that she describes as colicky associated with nausea and diarrhea.  The pain lasted for several minutes.  At this time, she feels okay.  Patient denies any gross hematuria, dysuria or suprapubic/flank pain.  Patient denies any fevers, chills or vomiting.   KUB on 04/29/2018 notes the stable appearance of the left lower pole nephrolithiasis.    Her UA today is positive for 3-10 RBC's.    PMH: Past Medical History:  Diagnosis Date  . Abnormal uterine bleeding   . Anomaly heart   . Anxiety   . Coronary artery disease   . Dyspnea    with exertion  . Dysrhythmia   . Heart attack ([place]) 03/2014  . Heart disease   . History of C-section   . History of kidney stones   . Hx of heart artery stent   . Hypertension     Surgical History: Past Surgical History:  Procedure Laterality Date  . CARDIAC CATHETERIZATION    . CARDIAC SURGERY    . [place]  . CHOLECYSTECTOMY    . CORONARY ANGIOPLASTY    . CYSTOSCOPY  10/10/2016   Procedure: CYSTOSCOPY;  Surgeon: Will Bonnet, MD;  Location: [facility] ORS;  Service: Gynecology;;  . Consuela Mimes W/ URETERAL STENT PLACEMENT Right 12/20/2017   Procedure: CYSTOSCOPY WITH RETROGRADE PYELOGRAM/URETERAL STENT PLACEMENT;  Surgeon: Billey Co, MD;  Location: [facility] ORS;  Service:  Urology;  Laterality: Right;  . CYSTOSCOPY WITH STENT PLACEMENT Left 11/01/2015   Procedure: CYSTOSCOPY WITH STENT PLACEMENT;  Surgeon: Hollice Espy, MD;  Location: [facility] ORS;  Service: Urology;  Laterality: Left;  . CYSTOSCOPY/RETROGRADE/URETEROSCOPY Bilateral 11/01/2015   Procedure: CYSTOSCOPY/RETROGRADE/URETEROSCOPY;  Surgeon: Hollice Espy, MD;  Location: [facility] ORS;  Service: Urology;  Laterality: Bilateral;  . DILATION AND CURETTAGE OF UTERUS  2011  . ENDOMETRIAL ABLATION  2011  . LAPAROSCOPIC HYSTERECTOMY Bilateral 10/10/2016    Procedure: HYSTERECTOMY TOTAL LAPAROSCOPIC BILATERAL SALPINGECTOMY;  Surgeon: Will Bonnet, MD;  Location: [facility] ORS;  Service: Gynecology;  Laterality: Bilateral;  . LEFT HEART CATHETERIZATION WITH CORONARY ANGIOGRAM N/A 04/06/2014   Procedure: LEFT HEART CATHETERIZATION WITH CORONARY ANGIOGRAM;  Surgeon: Jettie Booze, MD;  Location: [facility] CATH LAB;  Service: Cardiovascular;  Laterality: N/A;    Home Medications:  Allergies as of 04/29/2018      Reactions   Latex Dermatitis   Penicillins Rash   Has patient had a PCN reaction causing immediate rash, facial/tongue/throat swelling, SOB or lightheadedness with hypotension: Yes Has patient had a PCN reaction causing severe rash involving mucus membranes or skin necrosis: Unknown Has patient had a PCN reaction that required hospitalization: No Has patient had a PCN reaction occurring within the last 10 years: No If all of the above answers are "NO", then may proceed with Cephalosporin use.      Medication List       Accurate as of April 29, 2018  3:26 PM. Always use your most recent med list.        Chromium 200 MCG Caps Take by mouth.   clopidogrel 75 MG tablet Commonly known as:  PLAVIX TAKE 1 TABLET BY MOUTH EVERY DAY   cyclobenzaprine 10 MG tablet Commonly known as:  FLEXERIL Take 1 tablet (10 mg total) by mouth 3 (three) times daily as needed for muscle spasms.   escitalopram 20 MG tablet Commonly known as:  LEXAPRO TAKE 1 TABLET BY MOUTH EVERY DAY   HYDROcodone-acetaminophen 5-325 MG tablet Commonly known as:  NORCO/VICODIN Take 1 tablet by mouth every 6 (six) hours as needed for moderate pain.   isosorbide mononitrate 30 MG 24 hr tablet Commonly known as:  IMDUR Take 1 tablet (30 mg total) by mouth daily.   levothyroxine 25 MCG tablet Commonly known as:  SYNTHROID, LEVOTHROID TAKE 1 TABLET (25 MCG TOTAL) BY MOUTH DAILY BEFORE BREAKFAST.   lisinopril 5 MG tablet Commonly known as:  PRINIVIL,ZESTRIL TAKE 1  TABLET BY MOUTH EVERY DAY   Melatonin 1 MG Caps Take by mouth.   metFORMIN 500 MG tablet Commonly known as:  GLUCOPHAGE Take 1 tablet (500 mg total) by mouth daily with breakfast.   metoprolol tartrate 25 MG tablet Commonly known as:  LOPRESSOR TAKE 1 TABLET (25 MG TOTAL) TWICE A DAY (PATIENT NEEDS TO CALL AND SCHEDULE AN APPOINTMENT FOR FURTHER REFILLS FIRST ATTEMPT)   nitroGLYCERIN 0.4 MG SL tablet Commonly known as:  NITROSTAT Place 1 tablet (0.4 mg total) under the tongue every 5 (five) minutes x 3 doses as needed for chest pain.   ondansetron 4 MG disintegrating tablet Commonly known as:  Zofran ODT Take 1 tablet (4 mg total) by mouth every 8 (eight) hours as needed for nausea or vomiting.   oxybutynin 5 MG tablet Commonly known as:  DITROPAN Take 1 tablet (5 mg total) by mouth 3 (three) times daily.   rosuvastatin 20 MG tablet Commonly known as:  CRESTOR TAKE 1 TABLET DAILY  sulfamethoxazole-trimethoprim 800-160 MG tablet Commonly known as:  BACTRIM DS,SEPTRA DS Take 1 tablet by mouth daily.   sulfamethoxazole-trimethoprim 800-160 MG tablet Commonly known as:  BACTRIM DS,SEPTRA DS Take 1 tablet by mouth 2 (two) times daily.   tamsulosin 0.4 MG Caps capsule Commonly known as:  FLOMAX Take 1 capsule (0.4 mg total) by mouth daily.   Vitamin D (Cholecalciferol) 25 MCG (1000 UT) Caps Take 2,000 Units by mouth daily.       Allergies:  Allergies  Allergen Reactions  . Latex Dermatitis  . Penicillins Rash    Has patient had a PCN reaction causing immediate rash, facial/tongue/throat swelling, SOB or lightheadedness with hypotension: Yes Has patient had a PCN reaction causing severe rash involving mucus membranes or skin necrosis: Unknown Has patient had a PCN reaction that required hospitalization: No Has patient had a PCN reaction occurring within the last 10 years: No If all of the above answers are "NO", then may proceed with Cephalosporin use.     Family  History: Family History  Problem Relation Age of Onset  . Heart attack Father 27       died of MI at age 59  . High blood pressure Father   . High Cholesterol Father   . Heart disease Father   . Sudden death Father   . Hypertension Mother   . Hyperlipidemia Mother   . Hypothyroidism Mother   . Kidney Stones Sister   . Kidney Stones Brother   . Stroke Paternal Grandfather   . Liver cancer Maternal Grandmother   . Kidney disease Neg Hx   . Bladder Cancer Neg Hx     Social History:  reports that she has quit smoking. Her smoking use included cigarettes. She smoked 1.00 pack per day. She has quit using smokeless tobacco. She reports that she does not drink alcohol or use drugs.  ROS: UROLOGY Frequent Urination?: No Hard to postpone urination?: No Burning/pain with urination?: No Get up at night to urinate?: No Leakage of urine?: No Urine stream starts and stops?: No Trouble starting stream?: No Do you have to strain to urinate?: No Blood in urine?: No Urinary tract infection?: No Sexually transmitted disease?: No Injury to kidneys or bladder?: No Painful intercourse?: No Weak stream?: No Currently pregnant?: No Vaginal bleeding?: No  Gastrointestinal Nausea?: Yes Vomiting?: No Indigestion/heartburn?: No Diarrhea?: Yes Constipation?: No  Constitutional Fever: No Night sweats?: No Weight loss?: No Fatigue?: No  Skin Skin rash/lesions?: No Itching?: No  Eyes Blurred vision?: No Double vision?: No  Ears/Nose/Throat Sore throat?: No Sinus problems?: No  Hematologic/Lymphatic Swollen glands?: No Easy bruising?: No  Cardiovascular Leg swelling?: No Chest pain?: No  Respiratory Cough?: No Shortness of breath?: No  Endocrine Excessive thirst?: No  Musculoskeletal Back pain?: No Joint pain?: No  Neurological Headaches?: No Dizziness?: No  Psychologic Depression?: No Anxiety?: No  Physical Exam: BP 117/78 (BP Location: Left Arm, Patient  Position: Sitting)   Pulse 89   Ht 5\' 5"  (1.651 m)   Wt 260 lb (117.9 kg)   LMP 09/13/2016   BMI 43.27 kg/m   Constitutional:  Well nourished. Alert and oriented, No acute distress. HEENT: [place] AT, moist mucus membranes.  Trachea midline, no masses. Cardiovascular: No clubbing, cyanosis, or edema. Respiratory: Normal respiratory effort, no increased work of breathing. GI: Abdomen is soft, non tender, non distended, no abdominal masses. Liver and spleen not palpable.  No hernias appreciated.  Stool sample for occult testing is not indicated.  GU: No CVA tenderness.  No bladder fullness or masses.   Neurologic: Grossly intact, no focal deficits, moving all 4 extremities. Psychiatric: Normal mood and affect.  Laboratory Data: Lab Results  Component Value Date   WBC 11.0 (H) 12/20/2017   HGB 12.9 12/20/2017   HCT 39.0 12/20/2017   MCV 83.7 12/20/2017   PLT 243 12/20/2017    Lab Results  Component Value Date   CREATININE 0.82 12/20/2017    No results found for: PSA  No results found for: TESTOSTERONE  Lab Results  Component Value Date   HGBA1C 5.7 (H) 10/04/2017    Lab Results  Component Value Date   TSH 2.36 10/04/2017       Component Value Date/Time   CHOL 101 10/04/2017 0814   CHOL 143 05/14/2017 1145   HDL 29 (L) 10/04/2017 0814   HDL 32 (L) 05/14/2017 1145   CHOLHDL 3.5 10/04/2017 0814   VLDL 24.8 07/27/2014 0943   LDLCALC 53 10/04/2017 0814    Lab Results  Component Value Date   AST 25 12/20/2017   Lab Results  Component Value Date   ALT 29 12/20/2017   No components found for: ALKALINEPHOPHATASE No components found for: BILIRUBINTOTAL  No results found for: ESTRADIOL  Urinalysis + 2 blood, 0-5 WBC, 3-10 RBC and > 10 epithelial cells.  See Epic.   I have reviewed the labs.   Pertinent Imaging: CLINICAL DATA:  53 year old female with left-sided flank pain and nausea. History of left-sided nephrolithiasis  EXAM: ABDOMEN - 1 VIEW   COMPARISON:  Prior abdominal radiograph 02/07/2018  FINDINGS: Stable position of approximately 9 mm left lower pole renal stone compared to prior imaging. Surgical clips again noted in the right upper quadrant consistent with prior cholecystectomy. The visualized bowel gas pattern is normal.  IMPRESSION: Stable appearance of left lower pole nephrolithiasis.   Electronically Signed   By: Jacqulynn Cadet M.D.   On: 04/29/2018 15:11 I have independently reviewed the films with the patient and note the unchanged left lower pole stone.   Assessment & Plan:    1. Left nephrolithiasis - stable 10 mm stone in the left lower pole stone - likely not the source of her LLQ pain  - no ureteral fragments noted - Urinalysis, Complete - CULTURE, URINE COMPREHENSIVE - tamsulosin 0.4 mg daily given - Zofran 4 ODT q 4 hours given - hydrocodone/APAP 5/325, # 10 given - patient to strain urine  - will obtain CT if pain does not abate over the next few days - Patient is advised that if they should start to experience pain that is not able to be controlled with pain medication, intractable nausea and/or vomiting and/or fevers greater than 103 or shaking chills to contact the office immediately or seek treatment in the emergency department for emergent intervention.    2. LLQ pain ? Small fragment not visible on KUB Continue to monitor - will obtain CT if pain does not improve    Return in about 1 week (around 05/06/2018) for schedule virtual visit .  These notes generated with voice recognition software. I apologize for typographical errors.  Zara Council, PA-C  [facility] [address]  [place], [place] [address] [phone]

## 2018-05-01 ENCOUNTER — Telehealth: Payer: Self-pay

## 2018-05-01 LAB — CULTURE, URINE COMPREHENSIVE

## 2018-05-01 NOTE — Telephone Encounter (Signed)
.  mychart

## 2018-05-01 NOTE — Telephone Encounter (Signed)
-----   Message from Nori Riis, PA-C sent at 05/01/2018 11:44 AM EDT ----- Please let Mrs. Wisher know that her urine culture was negative.

## 2018-05-06 ENCOUNTER — Other Ambulatory Visit: Payer: Self-pay

## 2018-05-06 ENCOUNTER — Telehealth (INDEPENDENT_AMBULATORY_CARE_PROVIDER_SITE_OTHER): Payer: BLUE CROSS/BLUE SHIELD | Admitting: Urology

## 2018-05-06 DIAGNOSIS — R1032 Left lower quadrant pain: Secondary | ICD-10-CM | POA: Diagnosis not present

## 2018-05-06 NOTE — Progress Notes (Signed)
Virtual Visit via Telephone Note  I connected with Sheri [name] on 05/06/18 at 10:30 AM EDT by telephone and verified that I am speaking with the correct person using two identifiers.   I discussed the limitations, risks, security and privacy concerns of performing an evaluation and management service by telephone and the availability of in person appointments. We discussed the impact of the COVID-19 on the healthcare system, and the importance of social distancing and reducing patient and provider exposure. I also discussed with the patient that there may be a patient responsible charge related to this service. The patient expressed understanding and agreed to proceed.  Reason for visit: Left flank pain  History of Present Illness: [name] is a 53 year old female with cardiac history on plavix and history of recurrent nephrolithiasis. She last underwent right ureteroscopy/laser lithotripsy, stent placement with me in November 2019 for a 62mm distal stone.  She presented to clinic last week with severe left flank pain and nausea. KUB showed stable left 1cm non-obstructing lower pole stone and no ureteral calculus. Urine culture was negative. She reports her symptoms have improved, however she continues to have dull left flank pain. She does not want to come to the hospital for any imaging or other workup in the setting of COVID-19. She denies fevers/chills, hematuria. She is taking flomax, NSAIDs, and narcotics for pain, as well as zofran.  Assessment and Plan: I recommened CT stone protocol for definitive evaluation. She does not want any further imaging at this time as her pain has improved somewhat, and she does not want to be exposed to coronavirus at the hospital. I discussed return precautions including fever/chills, uncontrolled pain.  Follow Up: She will call if her symptoms worsen to obtain CT scan   I discussed the assessment and treatment plan with the patient. The patient was  provided an opportunity to ask questions and all were answered. The patient agreed with the plan and demonstrated an understanding of the instructions.   The patient was advised to call back or seek an in-person evaluation if the symptoms worsen or if the condition fails to improve as anticipated.  I provided 11 minutes of non-face-to-face time during this encounter.   Billey Co, MD

## 2018-05-09 ENCOUNTER — Other Ambulatory Visit: Payer: Self-pay | Admitting: Family Medicine

## 2018-05-09 DIAGNOSIS — R7303 Prediabetes: Secondary | ICD-10-CM

## 2018-05-17 ENCOUNTER — Other Ambulatory Visit: Payer: Self-pay | Admitting: Urology

## 2018-05-17 ENCOUNTER — Encounter: Payer: Self-pay | Admitting: Family Medicine

## 2018-05-17 ENCOUNTER — Other Ambulatory Visit: Payer: Self-pay

## 2018-05-17 ENCOUNTER — Ambulatory Visit (INDEPENDENT_AMBULATORY_CARE_PROVIDER_SITE_OTHER): Payer: BLUE CROSS/BLUE SHIELD | Admitting: Family Medicine

## 2018-05-17 DIAGNOSIS — F419 Anxiety disorder, unspecified: Secondary | ICD-10-CM | POA: Diagnosis not present

## 2018-05-17 DIAGNOSIS — F5104 Psychophysiologic insomnia: Secondary | ICD-10-CM

## 2018-05-17 DIAGNOSIS — F411 Generalized anxiety disorder: Secondary | ICD-10-CM | POA: Diagnosis not present

## 2018-05-17 DIAGNOSIS — F32A Depression, unspecified: Secondary | ICD-10-CM

## 2018-05-17 DIAGNOSIS — F430 Acute stress reaction: Secondary | ICD-10-CM

## 2018-05-17 DIAGNOSIS — F329 Major depressive disorder, single episode, unspecified: Secondary | ICD-10-CM

## 2018-05-17 DIAGNOSIS — N200 Calculus of kidney: Secondary | ICD-10-CM

## 2018-05-17 MED ORDER — BUSPIRONE HCL 5 MG PO TABS: 5.0000 mg | ORAL_TABLET | Freq: Two times a day (BID) | ORAL | 1 refills | Status: DC

## 2018-05-17 MED ORDER — TRAZODONE HCL 50 MG PO TABS: 25.0000 mg | ORAL_TABLET | Freq: Every day | ORAL | 1 refills | Status: DC

## 2018-05-17 NOTE — Progress Notes (Signed)
Virtual Visit via Telephone The purpose of this virtual visit is to provide medical care while limiting exposure to the novel coronavirus (COVID19) for both patient and office staff.  Consent was obtained for phone visit:  Yes.   Answered questions that patient had about telehealth interaction:  Yes.   I discussed the limitations, risks, security and privacy concerns of performing an evaluation and management service by telephone. I also discussed with the patient that there may be a patient responsible charge related to this service. The patient expressed understanding and agreed to proceed.  Patient Location: Home Provider Location: [facility])  ---------------------------------------------------------------------- Chief Complaint  Patient presents with  . Anxiety    S: Reviewed CMA documentation. 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 - Previously she had been controlled on SSRI Lexapro 20mg  since 2018 for her symptoms of mood swings, sweats and anxiety related to prior heart attack. Never on any other mood or anxiety medicine - Today now has concerns and seeks help for better mood and anxiety control due to all of these life stressors at this difficult time for her. - Additional insomnia now worse at night difficulty falling asleep and staying asleep, takes Melatonin 10mg  nightly already with limited benefit, and lavender essential oil diffuser - Her primary support system is her husband and family children, some of the stressors are  pertaining to them at this time, and she needs extra help Denies any suicidal ideation at this time.  Denies any high risk travel to areas of current concern for COVID19. Denies any known or suspected exposure to person with or possibly with COVID19.  Denies any fevers, chills, sweats, body ache, cough, shortness of breath, sinus pain or pressure, headache, abdominal pain, diarrhea  Past Medical History:  Diagnosis Date  . Abnormal uterine bleeding   . Anomaly heart   . Anxiety   . Coronary artery disease   . Dyspnea    with exertion  . Dysrhythmia   . Heart attack ([place]) 03/2014  . Heart disease   . History of C-section   . History of kidney stones   . Hx of heart artery stent   . Hypertension    Social History   Tobacco Use  . Smoking status: Former Smoker    Packs/day: 1.00    Types: Cigarettes  . Smokeless tobacco: Former Systems developer  . Tobacco comment: quit 20 years  Substance Use Topics  . Alcohol use: No    Alcohol/week: 0.0 standard drinks  . Drug use: No    Current Outpatient Medications:  .  Chromium 200 MCG CAPS, Take by mouth., Disp: 30 each, Rfl: 0 .  clopidogrel (PLAVIX) 75 MG tablet, TAKE 1 TABLET BY MOUTH EVERY DAY, Disp: 30 tablet, Rfl: 10 .  cyclobenzaprine (FLEXERIL) 10 MG tablet, Take 1 tablet (10 mg total) by mouth 3 (three) times daily as needed for muscle spasms., Disp: 30 tablet, Rfl: 0 .  escitalopram (LEXAPRO) 20 MG tablet, TAKE 1 TABLET BY MOUTH EVERY DAY, Disp: 90 tablet, Rfl: 1 .  isosorbide mononitrate (IMDUR) 30 MG 24 hr tablet, Take 1  tablet (30 mg total) by mouth daily., Disp: 90 tablet, Rfl: 3 .  levothyroxine (SYNTHROID, LEVOTHROID) 25 MCG tablet, TAKE 1 TABLET (25 MCG TOTAL) BY MOUTH DAILY BEFORE BREAKFAST., Disp: 30 tablet, Rfl: 5 .  lisinopril (PRINIVIL,ZESTRIL) 5 MG tablet, TAKE 1 TABLET BY MOUTH EVERY DAY, Disp: 90 tablet, Rfl: 3 .  Melatonin 1 MG CAPS, Take 10 mg by mouth at bedtime., Disp: , Rfl: 0 .  metFORMIN (GLUCOPHAGE) 500 MG  tablet, TAKE 1 TABLET BY MOUTH EVERY DAY WITH BREAKFAST, Disp: 30 tablet, Rfl: 5 .  metoprolol tartrate (LOPRESSOR) 25 MG tablet, TAKE 1 TABLET (25 MG TOTAL) TWICE A DAY (PATIENT NEEDS TO CALL AND SCHEDULE AN APPOINTMENT FOR FURTHER REFILLS FIRST ATTEMPT), Disp: 60 tablet, Rfl: 0 .  nitroGLYCERIN (NITROSTAT) 0.4 MG SL tablet, Place 1 tablet (0.4 mg total) under the tongue every 5 (five) minutes x 3 doses as needed for chest pain., Disp: 25 tablet, Rfl: 2 .  ondansetron (ZOFRAN ODT) 4 MG disintegrating tablet, Take 1 tablet (4 mg total) by mouth every 8 (eight) hours as needed for nausea or vomiting., Disp: 20 tablet, Rfl: 0 .  oxybutynin (DITROPAN) 5 MG tablet, Take 1 tablet (5 mg total) by mouth 3 (three) times daily., Disp: 270 tablet, Rfl: 1 .  rosuvastatin (CRESTOR) 20 MG tablet, TAKE 1 TABLET DAILY, Disp: 90 tablet, Rfl: 2 .  tamsulosin (FLOMAX) 0.4 MG CAPS capsule, Take 1 capsule (0.4 mg total) by mouth daily., Disp: 30 capsule, Rfl: 3 .  Vitamin D, Cholecalciferol, 1000 units CAPS, Take 1,000 Units by mouth daily. , Disp: , Rfl:  .  busPIRone (BUSPAR) 5 MG tablet, Take 1 tablet (5 mg total) by mouth 2 (two) times daily. May increase dose up to 1.5 pills = 7.5mg  per dose twice a day, Disp: 60 tablet, Rfl: 1 .  traZODone (DESYREL) 50 MG tablet, Take 0.5-1 tablets (25-50 mg total) by mouth at bedtime., Disp: 30 tablet, Rfl: 1  Depression screen [facility] 2/9 05/17/2018 11/30/2017 11/02/2017  Decreased Interest 3 0 0  Down, Depressed, Hopeless 3 0 0  PHQ - 2 Score 6 0 0  Altered sleeping 3 0 -  Tired, decreased energy 3 0 -  Change in appetite 3 0 -  Feeling bad or failure about yourself  3 0 -  Trouble concentrating 3 0 -  Moving slowly or fidgety/restless 0 0 -  Suicidal thoughts 0 0 -  PHQ-9 Score 21 0 -  Difficult doing work/chores Extremely dIfficult Not difficult at all -  Some recent data might be hidden    GAD 7 : Generalized Anxiety Score 05/17/2018 10/10/2017 01/02/2017 11/07/2016   Nervous, Anxious, on Edge 3 0 1 3  Control/stop worrying 3 0 1 3  Worry too much - different things 3 0 1 3  Trouble relaxing 3 0 1 3  Restless 0 0 0 0  Easily annoyed or irritable 3 0 0 0  Afraid - awful might happen 3 0 1 3  Total GAD 7 Score 18 0 5 15  Anxiety Difficulty Somewhat difficult Not difficult at all Not difficult at all Not difficult at all    -------------------------------------------------------------------------- O: No physical exam performed due to remote telephone encounter.  Lab results reviewed.  Recent Results (from the past 2160 hour(s))  Urinalysis, Complete     Status: Abnormal   Collection Time: 04/29/18  3:06 PM  Result Value Ref Range   Specific Gravity, UA >1.030 (H) 1.005 - 1.030   pH,  UA 5.5 5.0 - 7.5   Color, UA Yellow Yellow   Appearance Ur Cloudy (A) Clear   Leukocytes,UA Negative Negative   Protein,UA Negative Negative/Trace   Glucose, UA Negative Negative   Ketones, UA Negative Negative   RBC, UA 2+ (A) Negative   Bilirubin, UA Negative Negative   Urobilinogen, Ur 0.2 0.2 - 1.0 mg/dL   Nitrite, UA Negative Negative   Microscopic Examination See below:   Microscopic Examination     Status: Abnormal   Collection Time: 04/29/18  3:06 PM  Result Value Ref Range   WBC, UA 0-5 0 - 5 /hpf   RBC 3-10 (A) 0 - 2 /hpf   Epithelial Cells (non renal) >10 (H) 0 - 10 /hpf   Mucus, UA Present (A) Not Estab.   Bacteria, UA Few (A) None seen/Few  CULTURE, URINE COMPREHENSIVE     Status: None   Collection Time: 04/29/18  3:28 PM  Result Value Ref Range   Urine Culture, Comprehensive Final report    Organism ID, Bacteria Comment     Comment: Mixed urogenital flora 10,000-25,000 colony forming units per mL     -------------------------------------------------------------------------- A&P:  Problem List Items Addressed This Visit    Anxiety and depression - Primary   Relevant Medications   busPIRone (BUSPAR) 5 MG tablet   traZODone (DESYREL)  50 MG tablet   Other Relevant Orders   Ambulatory referral to Social Work    Other Visit Diagnoses    Anxiety in acute stress reaction       Relevant Medications   busPIRone (BUSPAR) 5 MG tablet   traZODone (DESYREL) 50 MG tablet   Other Relevant Orders   Ambulatory referral to Social Work   Psychophysiological insomnia       Relevant Medications   traZODone (DESYREL) 50 MG tablet   Other Relevant Orders   Ambulatory referral to Social Work     Suspected new acute on chronic worsening of underlying GAD / mixed mood disorder - Now with acute stress reaction x 1 month with multiple significant life stressors - Symptoms with more difficulty functioning, previously coped well, now affecting physically with fatigue from poor sleep. Suspected insomnia is secondary to anxiety/mood. -GAD7: 18, somewhat difficult / PHQ9: 21 extremely - On SSRI Lexapro chronically since 2018 No prior Psych / counseling  Plan: 1. Discussion on acute aspect of stressors worsening her existing symptoms 2. CONTINUE Escitalopram 20mg  daily - no change due to seems effective but it seems overwhelmed right now, and prefer not to titrate off and on to new med 3. ADD Buspar 5mg  BID - for anxiety, instructions to titrate up to 1.5 pills = 7.5mg  BID vs eventually x 2 = 10mg  BID if needed - notify for dose change new rx 4. ADD Trazodone 50mg  nightly for insomnia/mood - she can consider HALF dose nightly or 1 tablet, again may adjust dose if need - Note she was advised to PICK ONE of these two new meds to start immediately after 1 week if tolerating can ADD the other new med and adjust as advised 5. Referral to therapist Buena Irish [place] LCSW - handout per AVS available on mychart other resources given, safety info   Meds ordered this encounter  Medications  . busPIRone (BUSPAR) 5 MG tablet    Sig: Take 1 tablet (5 mg total) by mouth 2 (two) times daily. May increase dose up to 1.5 pills = 7.5mg  per dose twice a  day    Dispense:  60 tablet    Refill:  1  . traZODone (DESYREL) 50 MG tablet    Sig: Take 0.5-1 tablets (25-50 mg total) by mouth at bedtime.    Dispense:  30 tablet    Refill:  1   Orders Placed This Encounter  Procedures  . Ambulatory referral to Social Work    Referral Priority:   Routine    Referral Type:   Consultation    Referral Reason:   Specialty Services Required    Referred to Provider:   Buena Irish, LCSW    Number of Visits Requested:   1    Follow-up: - Return in 2-4 weeks for anxiety/mood PHQ / GAD - med adjust - sooner if needed if not effective, can send update via mychart if brief  Patient verbalizes understanding with the above medical recommendations including the limitation of remote medical advice.  Specific follow-up and call-back criteria were given for patient to follow-up or seek medical care more urgently if needed.   - Time spent in direct consultation with patient on phone: 19 minutes  [name], [facility] 05/17/2018, 11:04 AM

## 2018-05-17 NOTE — Patient Instructions (Addendum)
Continue Lexapro 20mg  daily  ADD new medicines - Buspar 5mg  twice a day for anxiety - in next 1 week if not effective you canINCREASE, cut tabs in half for ONE AND HALF tablet = 7.5mg  twice a day - or we can adjust further up to 10mg  twice a day  - Trazodone 50mg  - take HALF or whole at bedtime nightly for sleep   PICK ONE of these new medicines to start immediately and then after you try it for a week - then ADD the OTHER MED IN  If not improving call in 1-2 weeks and we can discuss options - otherwise, talk again in 4 weeks   Referral sent for therapist - stay tuned for appointment.  Buena Irish, LCSW [address] [place], [place] Main Line: [place] / [facility]) [place] [address] [place], [place] [address] Phone: [phone]  [employer], available walk-in 9am-4pm M-F [place], [place] [address] Hours: [place] (M-F, walk in available) [phone]  ------------------  Buena Irish, [address] [place], [place]: [phone]  ------------------  If thoughts of harming yourself or others, or any significant concern about your safety, please call for help immediately:  - [place] [phone] or [phone] - Suicide prevention hotline (838)622-6892  - 911   Please schedule a Follow-up Appointment to: Return in about 4 weeks (around 06/14/2018) for anxiety/mood PHQ/GAD.  If you have any other questions or concerns, please feel free to call the office or send a message through [place]. You may also schedule an earlier appointment if necessary.  Additionally, you may be receiving a survey about your experience at our office within a few days to 1 week by e-mail or mail. We value your feedback.  [name], DO [place]

## 2018-05-28 DIAGNOSIS — F329 Major depressive disorder, single episode, unspecified: Secondary | ICD-10-CM

## 2018-05-28 DIAGNOSIS — F32A Depression, unspecified: Secondary | ICD-10-CM

## 2018-05-28 DIAGNOSIS — F5104 Psychophysiologic insomnia: Secondary | ICD-10-CM

## 2018-05-28 DIAGNOSIS — F430 Acute stress reaction: Secondary | ICD-10-CM

## 2018-05-28 DIAGNOSIS — F419 Anxiety disorder, unspecified: Principal | ICD-10-CM

## 2018-05-28 DIAGNOSIS — F411 Generalized anxiety disorder: Secondary | ICD-10-CM

## 2018-05-29 ENCOUNTER — Other Ambulatory Visit: Payer: Self-pay | Admitting: Family Medicine

## 2018-05-29 DIAGNOSIS — F419 Anxiety disorder, unspecified: Principal | ICD-10-CM

## 2018-05-29 DIAGNOSIS — F430 Acute stress reaction: Secondary | ICD-10-CM

## 2018-05-29 DIAGNOSIS — F32A Depression, unspecified: Secondary | ICD-10-CM

## 2018-05-29 DIAGNOSIS — F329 Major depressive disorder, single episode, unspecified: Secondary | ICD-10-CM

## 2018-05-29 DIAGNOSIS — F411 Generalized anxiety disorder: Secondary | ICD-10-CM

## 2018-05-29 MED ORDER — BUSPIRONE HCL 7.5 MG PO TABS: 7.5000 mg | ORAL_TABLET | Freq: Three times a day (TID) | ORAL | 1 refills | Status: DC

## 2018-05-29 MED ORDER — TRAZODONE HCL 50 MG PO TABS: 50.0000 mg | ORAL_TABLET | Freq: Every day | ORAL | 1 refills | Status: DC

## 2018-05-29 NOTE — Addendum Note (Signed)
Addended by: Olin Hauser on: 05/29/2018 01:25 PM   Modules accepted: Orders

## 2018-05-31 ENCOUNTER — Other Ambulatory Visit: Payer: Self-pay | Admitting: Family Medicine

## 2018-05-31 DIAGNOSIS — F329 Major depressive disorder, single episode, unspecified: Secondary | ICD-10-CM

## 2018-05-31 DIAGNOSIS — F32A Depression, unspecified: Secondary | ICD-10-CM

## 2018-05-31 DIAGNOSIS — F419 Anxiety disorder, unspecified: Principal | ICD-10-CM

## 2018-06-05 ENCOUNTER — Other Ambulatory Visit: Payer: Self-pay

## 2018-06-05 MED ORDER — METOPROLOL TARTRATE 25 MG PO TABS: 25.0000 mg | ORAL_TABLET | Freq: Two times a day (BID) | ORAL | 0 refills | Status: DC

## 2018-06-14 ENCOUNTER — Other Ambulatory Visit: Payer: Self-pay

## 2018-06-14 ENCOUNTER — Other Ambulatory Visit: Payer: Self-pay | Admitting: Family Medicine

## 2018-06-14 ENCOUNTER — Encounter: Payer: Self-pay | Admitting: Family Medicine

## 2018-06-14 ENCOUNTER — Ambulatory Visit (INDEPENDENT_AMBULATORY_CARE_PROVIDER_SITE_OTHER): Payer: BLUE CROSS/BLUE SHIELD | Admitting: Family Medicine

## 2018-06-14 DIAGNOSIS — F3341 Major depressive disorder, recurrent, in partial remission: Secondary | ICD-10-CM | POA: Diagnosis not present

## 2018-06-14 DIAGNOSIS — R7303 Prediabetes: Secondary | ICD-10-CM

## 2018-06-14 DIAGNOSIS — E782 Mixed hyperlipidemia: Secondary | ICD-10-CM

## 2018-06-14 DIAGNOSIS — F5104 Psychophysiologic insomnia: Secondary | ICD-10-CM | POA: Diagnosis not present

## 2018-06-14 DIAGNOSIS — E559 Vitamin D deficiency, unspecified: Secondary | ICD-10-CM

## 2018-06-14 DIAGNOSIS — F411 Generalized anxiety disorder: Secondary | ICD-10-CM | POA: Diagnosis not present

## 2018-06-14 DIAGNOSIS — Z0000 Encounter for general adult medical examination without abnormal findings: Secondary | ICD-10-CM

## 2018-06-14 DIAGNOSIS — E039 Hypothyroidism, unspecified: Secondary | ICD-10-CM

## 2018-06-14 DIAGNOSIS — I10 Essential (primary) hypertension: Secondary | ICD-10-CM

## 2018-06-14 NOTE — Patient Instructions (Addendum)
Keep up the great work.  No change to medicines. Seems like they are working well.  I will update your Trazodone in our system to 100mg  once nightly instead of two of the 50s, you will need to [facility] for this refill when you are ready, pharmacy does not have this updated dose.   DUE for FASTING BLOOD WORK (no food or drink after midnight before the lab appointment, only water or coffee without cream/sugar on the morning of)  SCHEDULE "Lab Only" visit in the morning at the clinic for lab draw in 5 MONTHS   - Make sure Lab Only appointment is at about 1 week before your next appointment, so that results will be available  For Lab Results, once available within 2-3 days of blood draw, you can can log in to MyChart online to view your results and a brief explanation. Also, we can discuss results at next follow-up visit.   Please schedule a Follow-up Appointment to: Return in about 5 months (around 11/14/2018) for Annual Physical.  If you have any other questions or concerns, please feel free to call the office or send a message through [place]. You may also schedule an earlier appointment if necessary.  Additionally, you may be receiving a survey about your experience at our office within a few days to 1 week by e-mail or mail. We value your feedback.  [name], DO [place]

## 2018-06-14 NOTE — Assessment & Plan Note (Signed)
Improved on Trazodone 100mg  Continue SSRI

## 2018-06-14 NOTE — Progress Notes (Signed)
Virtual Visit via Telephone The purpose of this virtual visit is to provide medical care while limiting exposure to the novel coronavirus (COVID19) for both patient and office staff.  Consent was obtained for phone visit:  Yes.   Answered questions that patient had about telehealth interaction:  Yes.   I discussed the limitations, risks, security and privacy concerns of performing an evaluation and management service by telephone. I also discussed with the patient that there may be a patient responsible charge related to this service. The patient expressed understanding and agreed to proceed.  Patient Location: Home Provider Location: [facility])  ---------------------------------------------------------------------- Chief Complaint  Patient presents with  . Anxiety    S: Reviewed CMA documentation. I have called patient and gathered additional HPI as follows:  Follow-up GAD / Major Depression mixed recurrent partial remission With history of acute stress reaction - Last visit with me 05/2018 about 1 month ago, same problem initial visit with some worsening, treated with new start Buspar and Trazodone, see prior notes for background information. - Interval update with on 4/28 she notified us with updates, and dosing was increased slightly, Buspar to 7.5 TID and Trazodone up to 100mg  - Today patient reports doing very well, significant improvement in her anxiety, buspar is working and lasting long enough without "drop off" in middle of day, and she is sleeping better, still not always very easy to fall asleep initially but seems to stay asleep better - Current meds   - Buspar 7.5mg  TID   - Trazodone 50mg  x 2 = 100mg  nightly - She was notified by [place] LCSW for therapy to schedule, she has not done this yet will try to schedule today or soon - Her primary support system is her husband and family children, some of the stressors are family related Denies any suicidal  ideation at this time.  Denies any fevers, chills, sweats, body ache, cough, shortness of breath, sinus pain or pressure, headache, abdominal pain, diarrhea  Past Medical History:  Diagnosis Date  . Abnormal uterine bleeding   . Anomaly heart   . Anxiety   . Coronary artery disease   . Dyspnea    with exertion  . Dysrhythmia   . Heart attack ([place]) 03/2014  . Heart disease   . History of C-section   . History of kidney stones   . Hx of heart artery stent   . Hypertension    Social History   Tobacco Use  . Smoking status: Former Smoker    Packs/day: 1.00    Types: Cigarettes  . Smokeless tobacco: Former Systems developer  . Tobacco comment: quit 20 years  Substance Use Topics  . Alcohol use: No    Alcohol/week: 0.0 standard drinks  . Drug use: No    Current Outpatient Medications:  .  busPIRone (BUSPAR) 7.5 MG tablet, Take 1 tablet (7.5 mg total) by mouth 3 (three) times daily., Disp: 270 tablet, Rfl: 1 .  Chromium 200 MCG CAPS, Take by mouth., Disp: 30 each, Rfl: 0 .  clopidogrel (PLAVIX) 75 MG tablet, TAKE 1 TABLET BY MOUTH EVERY DAY, Disp: 30 tablet, Rfl: 10 .  cyclobenzaprine (FLEXERIL) 10 MG tablet, Take 1 tablet (10 mg total) by mouth 3 (three) times daily as needed for muscle spasms., Disp: 30 tablet, Rfl: 0 .  escitalopram (LEXAPRO) 20 MG tablet, TAKE 1 TABLET BY MOUTH EVERY DAY, Disp: 90 tablet, Rfl: 1 .  isosorbide mononitrate (IMDUR) 30 MG 24 hr tablet, Take 1 tablet (  30 mg total) by mouth daily., Disp: 90 tablet, Rfl: 3 .  levothyroxine (SYNTHROID, LEVOTHROID) 25 MCG tablet, TAKE 1 TABLET (25 MCG TOTAL) BY MOUTH DAILY BEFORE BREAKFAST., Disp: 30 tablet, Rfl: 5 .  lisinopril (PRINIVIL,ZESTRIL) 5 MG tablet, TAKE 1 TABLET BY MOUTH EVERY DAY, Disp: 90 tablet, Rfl: 3 .  Melatonin 1 MG CAPS, Take 10 mg by mouth at bedtime., Disp: , Rfl: 0 .  metFORMIN (GLUCOPHAGE) 500 MG tablet, TAKE 1 TABLET BY MOUTH EVERY DAY WITH BREAKFAST, Disp: 30 tablet, Rfl: 5 .  metoprolol tartrate  (LOPRESSOR) 25 MG tablet, Take 1 tablet (25 mg total) by mouth 2 (two) times daily., Disp: 60 tablet, Rfl: 0 .  nitroGLYCERIN (NITROSTAT) 0.4 MG SL tablet, Place 1 tablet (0.4 mg total) under the tongue every 5 (five) minutes x 3 doses as needed for chest pain., Disp: 25 tablet, Rfl: 2 .  ondansetron (ZOFRAN ODT) 4 MG disintegrating tablet, Take 1 tablet (4 mg total) by mouth every 8 (eight) hours as needed for nausea or vomiting., Disp: 20 tablet, Rfl: 0 .  oxybutynin (DITROPAN) 5 MG tablet, Take 1 tablet (5 mg total) by mouth 3 (three) times daily., Disp: 270 tablet, Rfl: 1 .  rosuvastatin (CRESTOR) 20 MG tablet, TAKE 1 TABLET DAILY, Disp: 90 tablet, Rfl: 2 .  tamsulosin (FLOMAX) 0.4 MG CAPS capsule, Take 1 capsule (0.4 mg total) by mouth daily., Disp: 30 capsule, Rfl: 3 .  traZODone (DESYREL) 100 MG tablet, Take 1 tablet (100 mg total) by mouth at bedtime., Disp: 90 tablet, Rfl: 1 .  Vitamin D, Cholecalciferol, 1000 units CAPS, Take 1,000 Units by mouth daily. , Disp: , Rfl:   Depression screen [facility] 2/9 06/14/2018 05/17/2018 11/30/2017  Decreased Interest 1 3 0  Down, Depressed, Hopeless 1 3 0  PHQ - 2 Score 2 6 0  Altered sleeping 3 3 0  Tired, decreased energy 1 3 0  Change in appetite 3 3 0  Feeling bad or failure about yourself  1 3 0  Trouble concentrating 1 3 0  Moving slowly or fidgety/restless 1 0 0  Suicidal thoughts 0 0 0  PHQ-9 Score 12 21 0  Difficult doing work/chores Somewhat difficult Extremely dIfficult Not difficult at all  Some recent data might be hidden    GAD 7 : Generalized Anxiety Score 06/14/2018 05/17/2018 10/10/2017 01/02/2017  Nervous, Anxious, on Edge 1 3 0 1  Control/stop worrying 1 3 0 1  Worry too much - different things 1 3 0 1  Trouble relaxing 1 3 0 1  Restless 0 0 0 0  Easily annoyed or irritable 1 3 0 0  Afraid - awful might happen 1 3 0 1  Total GAD 7 Score 6 18 0 5  Anxiety Difficulty Somewhat difficult Somewhat difficult Not difficult at all Not  difficult at all    -------------------------------------------------------------------------- O: No physical exam performed due to remote telephone encounter.  Lab results reviewed.  Recent Results (from the past 2160 hour(s))  Urinalysis, Complete     Status: Abnormal   Collection Time: 04/29/18  3:06 PM  Result Value Ref Range   Specific Gravity, UA >1.030 (H) 1.005 - 1.030   pH, UA 5.5 5.0 - 7.5   Color, UA Yellow Yellow   Appearance Ur Cloudy (A) Clear   Leukocytes,UA Negative Negative   Protein,UA Negative Negative/Trace   Glucose, UA Negative Negative   Ketones, UA Negative Negative   RBC, UA 2+ (A) Negative   Bilirubin,  UA Negative Negative   Urobilinogen, Ur 0.2 0.2 - 1.0 mg/dL   Nitrite, UA Negative Negative   Microscopic Examination See below:   Microscopic Examination     Status: Abnormal   Collection Time: 04/29/18  3:06 PM  Result Value Ref Range   WBC, UA 0-5 0 - 5 /hpf   RBC 3-10 (A) 0 - 2 /hpf   Epithelial Cells (non renal) >10 (H) 0 - 10 /hpf   Mucus, UA Present (A) Not Estab.   Bacteria, UA Few (A) None seen/Few  CULTURE, URINE COMPREHENSIVE     Status: None   Collection Time: 04/29/18  3:28 PM  Result Value Ref Range   Urine Culture, Comprehensive Final report    Organism ID, Bacteria Comment     Comment: Mixed urogenital flora 10,000-25,000 colony forming units per mL     -------------------------------------------------------------------------- A&P:  Problem List Items Addressed This Visit    GAD (generalized anxiety disorder) - Primary    Dramatic improvement now on med management buspar / trazodone, since last apt in 1 month Acute stressors improved, seems partial remission on mood Improved sleep maintenance - Chronic mood problem s/p MI 03/2014  Plan: 1. Continue Escitalopram from 20mg  daily, Buspar 7.5mg  TID, Trazodone 100mg  nightly (x 2 of 50mg  tabs, notify our office for new order when ready) 2. Now ready to schedule with therapist -  referred last time, she has to call to schedule with Buena Irish, LCSW [place] 3. F/u PRN - anticipated next 5 months      Relevant Medications   traZODone (DESYREL) 100 MG tablet   Psychophysiological insomnia    Improved on Trazodone 100mg  Continue SSRI      Relevant Medications   traZODone (DESYREL) 100 MG tablet   Recurrent major depression in partial remission ([place])    See A&P for GAD, insomnia      Relevant Medications   traZODone (DESYREL) 100 MG tablet      No orders of the defined types were placed in this encounter.   Follow-up: - Return in 5 months for Annual Physical - Future labs ordered for 11/08/18  Patient verbalizes understanding with the above medical recommendations including the limitation of remote medical advice.  Specific follow-up and call-back criteria were given for patient to follow-up or seek medical care more urgently if needed.   - Time spent in direct consultation with patient on phone: 11 minutes   [name], [facility] 06/14/2018, 9:29 AM

## 2018-06-14 NOTE — Assessment & Plan Note (Signed)
See A&P for GAD, insomnia

## 2018-06-14 NOTE — Assessment & Plan Note (Signed)
Dramatic improvement now on med management buspar / trazodone, since last apt in 1 month Acute stressors improved, seems partial remission on mood Improved sleep maintenance - Chronic mood problem s/p MI 03/2014  Plan: 1. Continue Escitalopram from 20mg  daily, Buspar 7.5mg  TID, Trazodone 100mg  nightly (x 2 of 50mg  tabs, notify our office for new order when ready) 2. Now ready to schedule with therapist - referred last time, she has to call to schedule with Buena Irish, LCSW [place] 3. F/u PRN - anticipated next 5 months

## 2018-07-01 IMAGING — US US RENAL
1 series · 14 of 25 positions shown · non-contrast
Comparison: CT 12/09/2014

CLINICAL DATA: History of nephrolithiasis. Bilateral back pain for
3 weeks

EXAM:
RENAL / URINARY TRACT ULTRASOUND COMPLETE

[Series 1: us renal · 0.23mm/px · 14 of 32 slices shown]
[im 1/32]
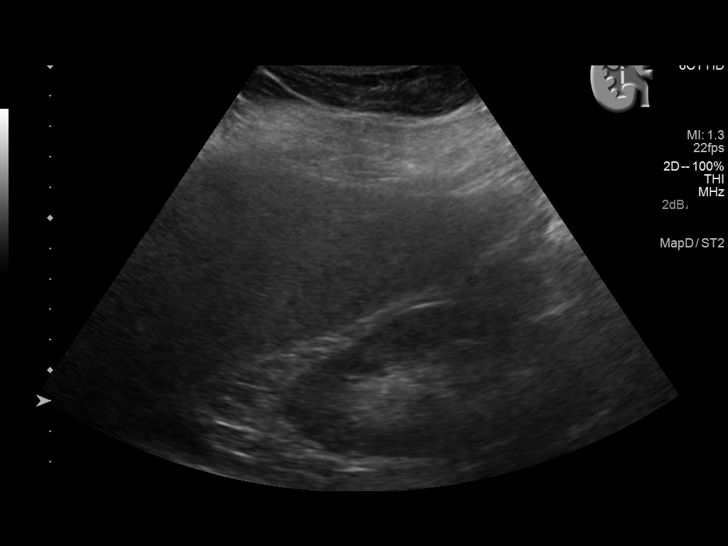
[im 3/32]
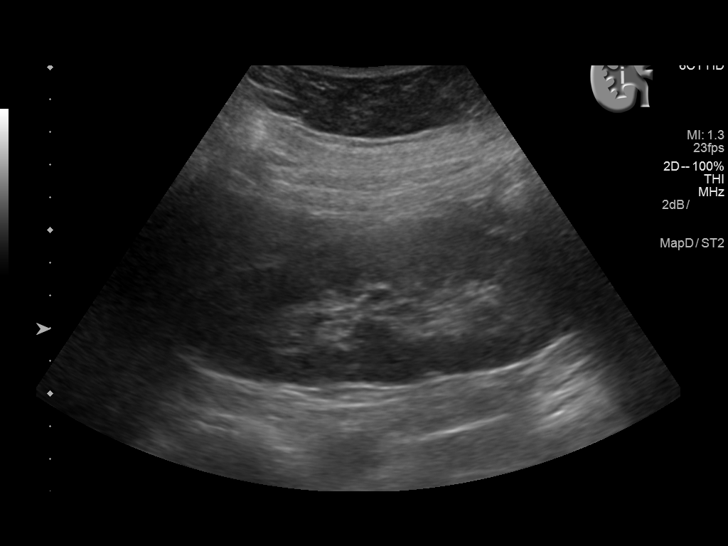
[im 6/32]
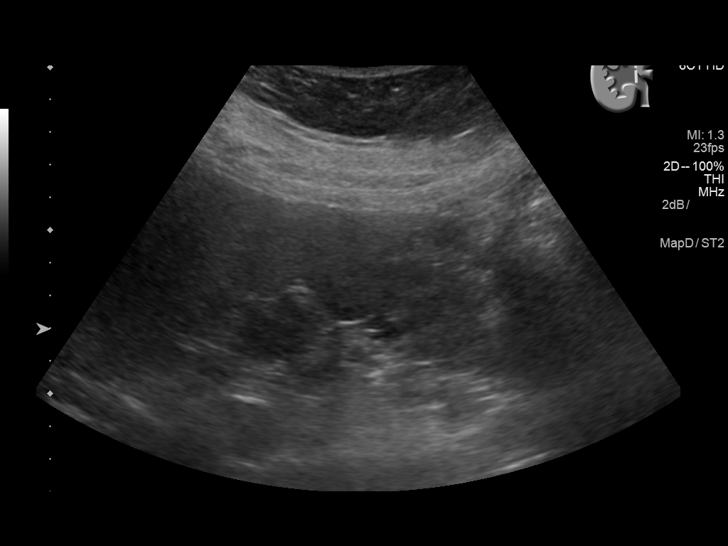
[im 8/32]
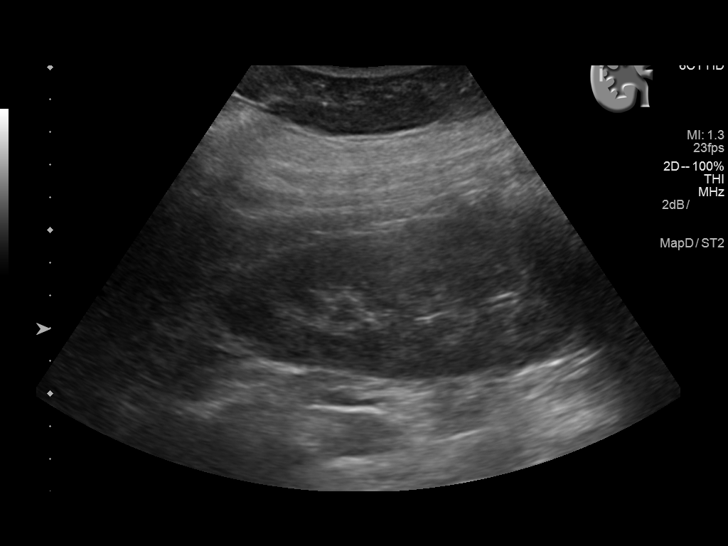
[im 11/32]
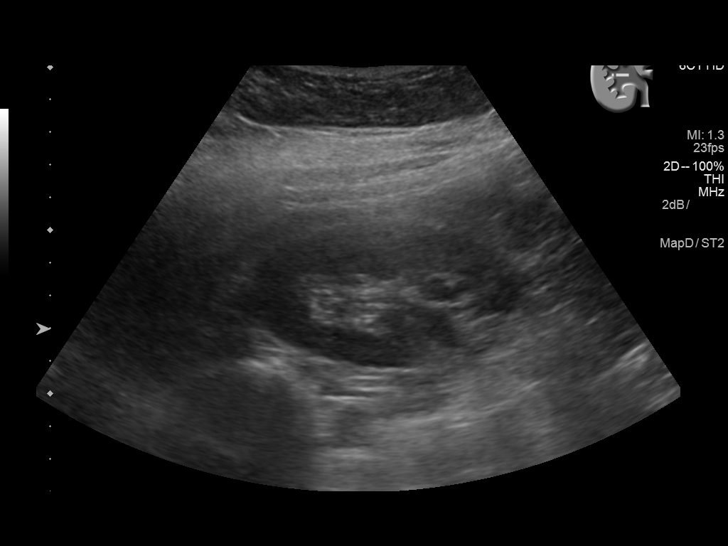
[im 12/32]
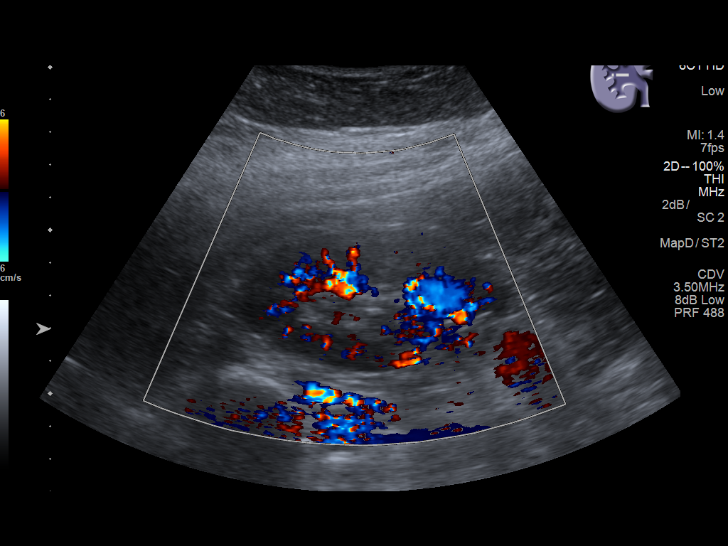
[im 15/32]
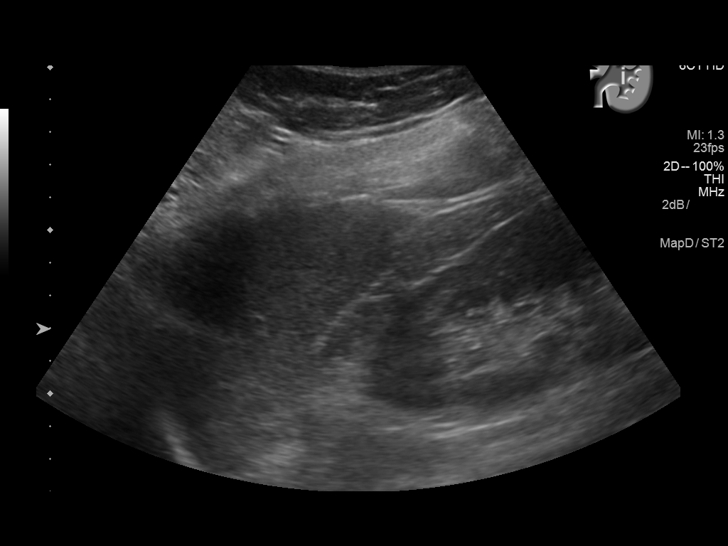
[im 17/32]
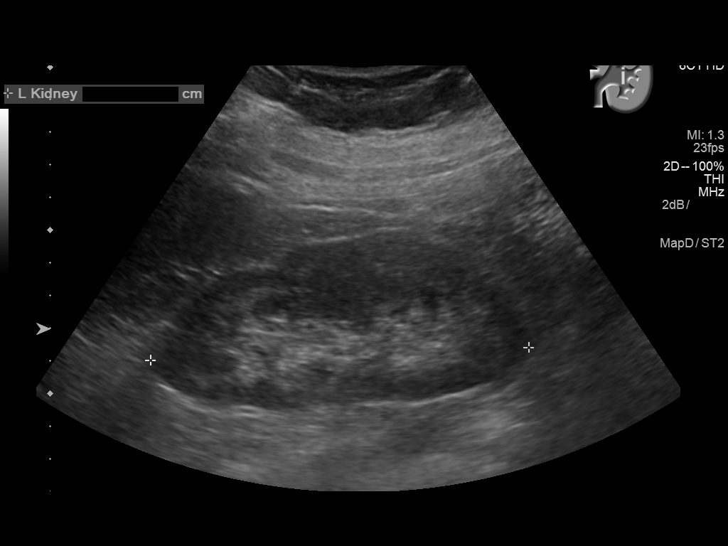
[im 20/32]
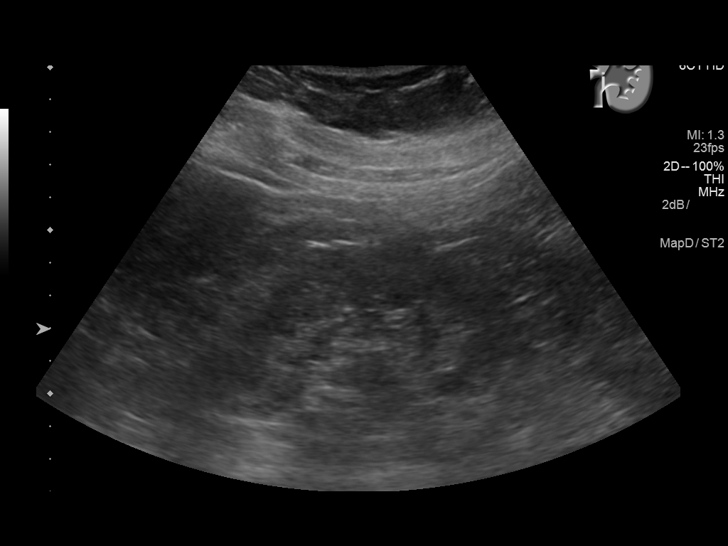
[im 21/32]
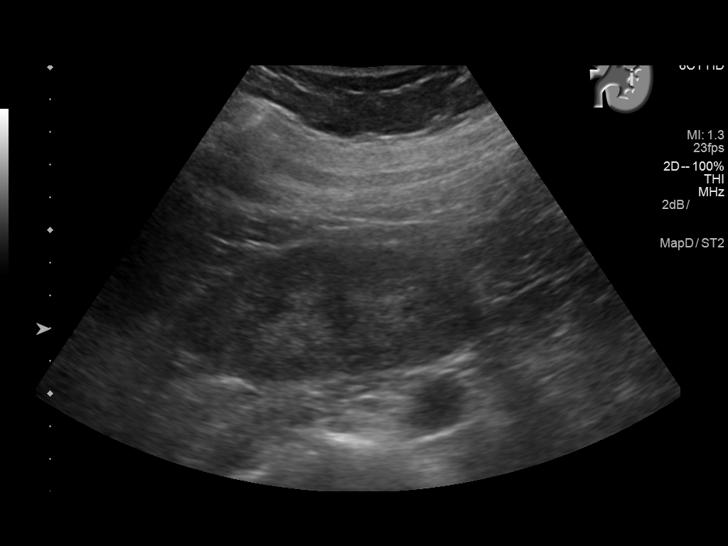
[im 24/32]
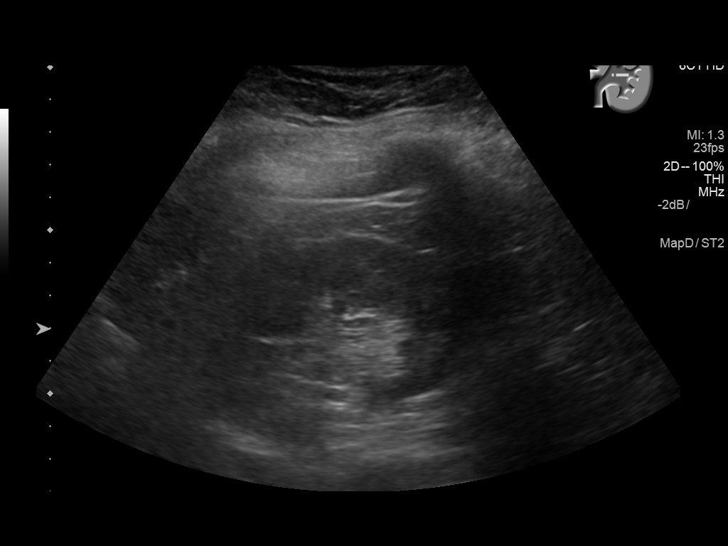
[im 26/32]
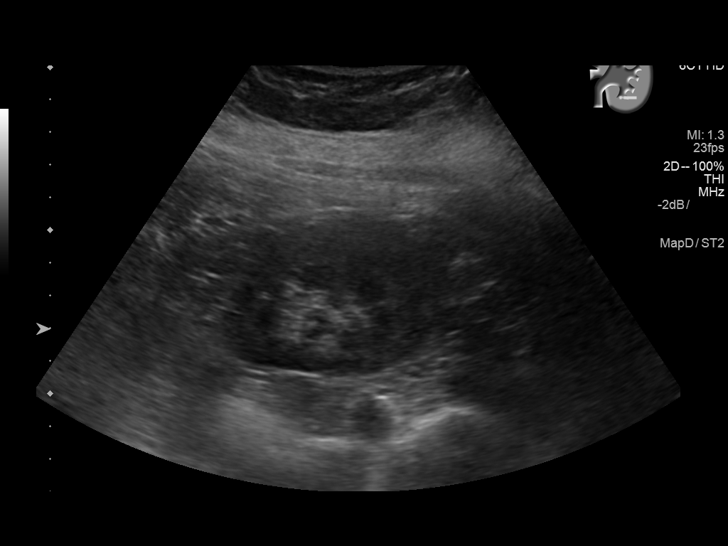
[im 29/32]
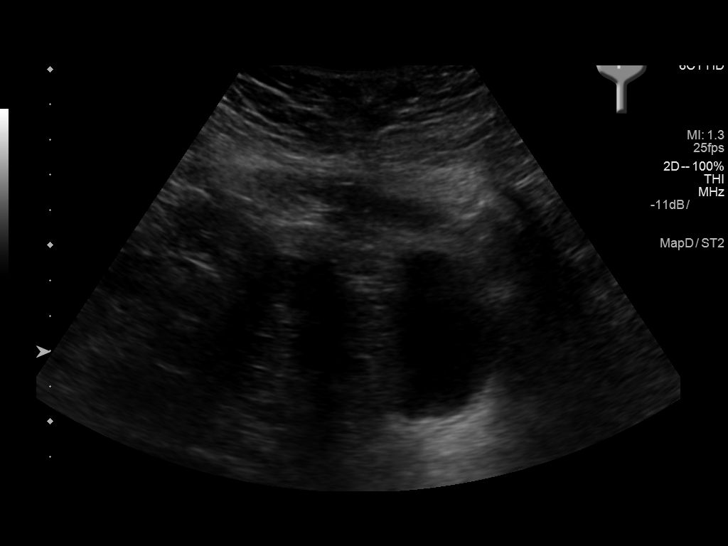
[im 32/32]
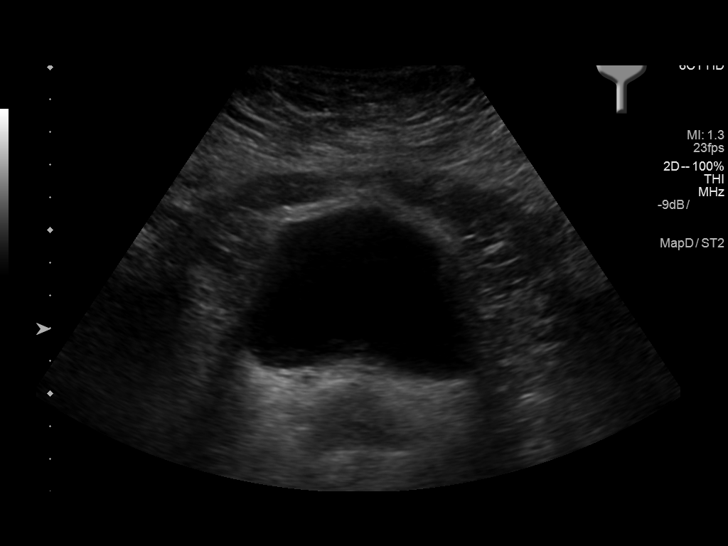

[14 of 25 positions shown; findings below may reference images not displayed]

FINDINGS: Right Kidney:

Length: 11.9 cm. Echogenicity within normal limits. No mass or
hydronephrosis visualized.

Left Kidney:

Length: 11.6 cm. Echogenicity within normal limits. No mass or
hydronephrosis visualized.

Bladder:

Appears normal for degree of bladder distention.
IMPRESSION: Unremarkable renal ultrasound.

## 2018-07-02 DIAGNOSIS — F5104 Psychophysiologic insomnia: Secondary | ICD-10-CM

## 2018-07-02 MED ORDER — TRAZODONE HCL 100 MG PO TABS: 100.0000 mg | ORAL_TABLET | Freq: Every day | ORAL | 1 refills | Status: DC

## 2018-07-15 ENCOUNTER — Other Ambulatory Visit: Payer: Self-pay

## 2018-07-15 MED ORDER — METOPROLOL TARTRATE 25 MG PO TABS: 25.0000 mg | ORAL_TABLET | Freq: Two times a day (BID) | ORAL | 0 refills | Status: DC

## 2018-07-19 ENCOUNTER — Other Ambulatory Visit: Payer: Self-pay | Admitting: Interventional Cardiology

## 2018-07-20 ENCOUNTER — Other Ambulatory Visit: Payer: Self-pay | Admitting: Family Medicine

## 2018-07-20 DIAGNOSIS — F5104 Psychophysiologic insomnia: Secondary | ICD-10-CM

## 2018-08-19 ENCOUNTER — Other Ambulatory Visit: Payer: Self-pay | Admitting: Urology

## 2018-08-19 DIAGNOSIS — N200 Calculus of kidney: Secondary | ICD-10-CM

## 2018-08-19 MED ORDER — METOPROLOL TARTRATE 25 MG PO TABS: 25.0000 mg | ORAL_TABLET | Freq: Two times a day (BID) | ORAL | 0 refills | Status: DC

## 2018-08-19 NOTE — Telephone Encounter (Signed)
Pt's medication was sent to pt's pharmacy as requested. Confirmation received.  °

## 2018-08-21 ENCOUNTER — Other Ambulatory Visit: Payer: Self-pay | Admitting: Family Medicine

## 2018-08-21 DIAGNOSIS — E039 Hypothyroidism, unspecified: Secondary | ICD-10-CM

## 2018-09-06 ENCOUNTER — Other Ambulatory Visit: Payer: Self-pay | Admitting: Cardiology

## 2018-09-19 ENCOUNTER — Telehealth: Payer: BLUE CROSS/BLUE SHIELD | Admitting: Physician Assistant

## 2018-09-19 DIAGNOSIS — R300 Dysuria: Secondary | ICD-10-CM

## 2018-09-19 DIAGNOSIS — R109 Unspecified abdominal pain: Secondary | ICD-10-CM

## 2018-09-19 DIAGNOSIS — N390 Urinary tract infection, site not specified: Secondary | ICD-10-CM | POA: Diagnosis not present

## 2018-09-19 DIAGNOSIS — N200 Calculus of kidney: Secondary | ICD-10-CM

## 2018-09-19 NOTE — Progress Notes (Signed)
Based on what you shared with me, I feel your condition warrants further evaluation and I recommend that you be seen for a face to face office visit.  Unfortunately we cannot help treat for kidney stones via e-visit. There is a change the symptoms are stemming from intermittent obstruction of the stone which can cause urine buildup back on the kidneys and cause issues. If you have a Urologist I recommend you give them a call so that they can assess. If not I recommend you contact your primary care provider, or if symptoms are acutely worsening, be seen at an Urgent Care or ER this evening.   NOTE: If you entered your credit card information for this eVisit, you will not be charged. You may see a "hold" on your card for the $35 but that hold will drop off and you will not have a charge processed.  If you are having a true medical emergency please call 911.     For an urgent face to face visit, [facility] has five urgent care centers for your convenience:   [email or web address] to reserve your spot online an avoid wait times  [place], [place], [place] [address] *Just off [address], across the road from [place] hours of operation: Monday-Friday, 12 PM to 6 PM  Closed Saturday & Sunday    . [facility]    [phone]                  Get Driving Directions  [address], [place] [address] . 10 am to 8 pm Monday-Friday . 12 pm to 8 pm Saturday-Sunday   . [facility] at [place]                  Get Driving Directions  [address], [place], [place] [address] . 8 am to 8 pm Monday-Friday . 9 am to 6 pm Saturday . 11 am to 6 pm Sunday   . [facility] at [place]                  Get Driving Directions   [address], [place] [address] . 8 am to 8 pm  Monday-Friday . 8 am to 4 pm Saturday-Sunday    . [facility] at [place]                    Get Driving Directions  [phone]  [address] [place],  [address]  . Monday-Friday, 12 PM to 6 PM    Your e-visit answers were reviewed by a board certified advanced clinical practitioner to complete your personal care plan.  Thank you for using e-Visits.

## 2018-09-27 ENCOUNTER — Telehealth: Payer: Self-pay

## 2018-09-27 NOTE — Telephone Encounter (Signed)
Called patient to follow up on after hours nurse call for dysuria and back pain for possible UTI. Left patient mess to call to see PA today

## 2018-10-02 ENCOUNTER — Other Ambulatory Visit: Payer: Self-pay | Admitting: Interventional Cardiology

## 2018-10-17 ENCOUNTER — Other Ambulatory Visit: Payer: Self-pay | Admitting: Interventional Cardiology

## 2018-10-23 ENCOUNTER — Other Ambulatory Visit: Payer: Self-pay | Admitting: Family Medicine

## 2018-10-23 DIAGNOSIS — F5104 Psychophysiologic insomnia: Secondary | ICD-10-CM

## 2018-10-23 MED ORDER — TRAZODONE HCL 100 MG PO TABS: 100.0000 mg | ORAL_TABLET | Freq: Every day | ORAL | 0 refills | Status: DC

## 2018-10-23 NOTE — Telephone Encounter (Signed)
Pt is about to run out of medications was told by pharmacy she needs to set up appt but is scheduled for a physical on 11/14/18, can a refill be sent in or even just enough to get her to her appt date?    traZODone (DESYREL) 100 MG tablet [id number]

## 2018-11-05 ENCOUNTER — Other Ambulatory Visit: Payer: BLUE CROSS/BLUE SHIELD

## 2018-11-05 DIAGNOSIS — Z0000 Encounter for general adult medical examination without abnormal findings: Secondary | ICD-10-CM | POA: Diagnosis not present

## 2018-11-05 DIAGNOSIS — E782 Mixed hyperlipidemia: Secondary | ICD-10-CM | POA: Diagnosis not present

## 2018-11-05 DIAGNOSIS — I10 Essential (primary) hypertension: Secondary | ICD-10-CM | POA: Diagnosis not present

## 2018-11-05 DIAGNOSIS — E039 Hypothyroidism, unspecified: Secondary | ICD-10-CM | POA: Diagnosis not present

## 2018-11-05 DIAGNOSIS — E559 Vitamin D deficiency, unspecified: Secondary | ICD-10-CM | POA: Diagnosis not present

## 2018-11-06 LAB — LIPID PANEL
Cholesterol: 133 mg/dL (ref <200)
HDL: 34 mg/dL — ABNORMAL LOW (ref >=50)
LDL Cholesterol (Calc): 80 mg/dL (calc)
Non-HDL Cholesterol (Calc): 99 mg/dL (calc) (ref <130)
Total CHOL/HDL Ratio: 3.9 (calc) (ref <5.0)
Triglycerides: 102 mg/dL (ref <150)

## 2018-11-06 LAB — COMPLETE METABOLIC PANEL WITH GFR
AG Ratio: 1.8 (calc) (ref 1.0–2.5)
ALT: 42 U/L — ABNORMAL HIGH (ref 6–29)
AST: 30 U/L (ref 10–35)
Albumin: 4.3 g/dL (ref 3.6–5.1)
Alkaline phosphatase (APISO): 99 U/L (ref 37–153)
BUN: 21 mg/dL (ref 7–25)
CO2: 28 mmol/L (ref 20–32)
Calcium: 9.3 mg/dL (ref 8.6–10.4)
Chloride: 104 mmol/L (ref 98–110)
Creat: 0.71 mg/dL (ref 0.50–1.05)
GFR, Est African American: 112 mL/min/{1.73_m2} (ref >=60)
GFR, Est Non African American: 97 mL/min/{1.73_m2} (ref >=60)
Globulin: 2.4 g/dL (calc) (ref 1.9–3.7)
Glucose, Bld: 118 mg/dL — ABNORMAL HIGH (ref 65–99)
Potassium: 4.2 mmol/L (ref 3.5–5.3)
Sodium: 140 mmol/L (ref 135–146)
Total Bilirubin: 0.4 mg/dL (ref 0.2–1.2)
Total Protein: 6.7 g/dL (ref 6.1–8.1)

## 2018-11-06 LAB — VITAMIN D 25 HYDROXY (VIT D DEFICIENCY, FRACTURES): Vit D, 25-Hydroxy: 32 ng/mL (ref 30–100)

## 2018-11-06 LAB — TSH: TSH: 3.19 mIU/L

## 2018-11-06 LAB — CBC WITH DIFFERENTIAL/PLATELET
Absolute Monocytes: 390 cells/uL (ref 200–950)
Basophils Absolute: 49 cells/uL (ref 0–200)
Basophils Relative: 0.8 %
Eosinophils Absolute: 159 cells/uL (ref 15–500)
Eosinophils Relative: 2.6 %
HCT: 41 % (ref 35.0–45.0)
Hemoglobin: 13.4 g/dL (ref 11.7–15.5)
Lymphs Abs: 2044 cells/uL (ref 850–3900)
MCH: 28 pg (ref 27.0–33.0)
MCHC: 32.7 g/dL (ref 32.0–36.0)
MCV: 85.6 fL (ref 80.0–100.0)
MPV: 11.7 fL (ref 7.5–12.5)
Monocytes Relative: 6.4 %
Neutro Abs: 3459 cells/uL (ref 1500–7800)
Neutrophils Relative %: 56.7 %
Platelets: 228 10*3/uL (ref 140–400)
RBC: 4.79 10*6/uL (ref 3.80–5.10)
RDW: 13.3 % (ref 11.0–15.0)
Total Lymphocyte: 33.5 %
WBC: 6.1 10*3/uL (ref 3.8–10.8)

## 2018-11-06 LAB — HEMOGLOBIN A1C
Hgb A1c MFr Bld: 5.7 % of total Hgb — ABNORMAL HIGH (ref <5.7)
Mean Plasma Glucose: 117 (calc)
eAG (mmol/L): 6.5 (calc)

## 2018-11-06 LAB — T4, FREE: Free T4: 1.1 ng/dL (ref 0.8–1.8)

## 2018-11-08 ENCOUNTER — Other Ambulatory Visit: Payer: BLUE CROSS/BLUE SHIELD

## 2018-11-10 DIAGNOSIS — Z20828 Contact with and (suspected) exposure to other viral communicable diseases: Secondary | ICD-10-CM | POA: Diagnosis not present

## 2018-11-13 ENCOUNTER — Other Ambulatory Visit: Payer: Self-pay | Admitting: Family Medicine

## 2018-11-13 ENCOUNTER — Other Ambulatory Visit: Payer: Self-pay | Admitting: Interventional Cardiology

## 2018-11-13 DIAGNOSIS — F411 Generalized anxiety disorder: Secondary | ICD-10-CM

## 2018-11-13 DIAGNOSIS — F32A Depression, unspecified: Secondary | ICD-10-CM

## 2018-11-13 DIAGNOSIS — F329 Major depressive disorder, single episode, unspecified: Secondary | ICD-10-CM

## 2018-11-13 DIAGNOSIS — F430 Acute stress reaction: Secondary | ICD-10-CM

## 2018-11-13 DIAGNOSIS — R7303 Prediabetes: Secondary | ICD-10-CM

## 2018-11-14 ENCOUNTER — Other Ambulatory Visit: Payer: Self-pay

## 2018-11-14 ENCOUNTER — Encounter: Payer: Self-pay | Admitting: Family Medicine

## 2018-11-14 ENCOUNTER — Ambulatory Visit (INDEPENDENT_AMBULATORY_CARE_PROVIDER_SITE_OTHER): Payer: BC Managed Care – PPO | Admitting: Family Medicine

## 2018-11-14 VITALS — BP 115/72 | HR 67 | Temp 98.2°F | Resp 16 | Ht 65.0 in | Wt 266.8 lb

## 2018-11-14 DIAGNOSIS — F32A Depression, unspecified: Secondary | ICD-10-CM

## 2018-11-14 DIAGNOSIS — Z0000 Encounter for general adult medical examination without abnormal findings: Secondary | ICD-10-CM | POA: Diagnosis not present

## 2018-11-14 DIAGNOSIS — E039 Hypothyroidism, unspecified: Secondary | ICD-10-CM

## 2018-11-14 DIAGNOSIS — F419 Anxiety disorder, unspecified: Secondary | ICD-10-CM

## 2018-11-14 DIAGNOSIS — F329 Major depressive disorder, single episode, unspecified: Secondary | ICD-10-CM

## 2018-11-14 DIAGNOSIS — Z1211 Encounter for screening for malignant neoplasm of colon: Secondary | ICD-10-CM | POA: Diagnosis not present

## 2018-11-14 DIAGNOSIS — I25118 Atherosclerotic heart disease of native coronary artery with other forms of angina pectoris: Secondary | ICD-10-CM

## 2018-11-14 DIAGNOSIS — R7303 Prediabetes: Secondary | ICD-10-CM

## 2018-11-14 DIAGNOSIS — Z23 Encounter for immunization: Secondary | ICD-10-CM

## 2018-11-14 DIAGNOSIS — F5104 Psychophysiologic insomnia: Secondary | ICD-10-CM

## 2018-11-14 DIAGNOSIS — E782 Mixed hyperlipidemia: Secondary | ICD-10-CM

## 2018-11-14 DIAGNOSIS — Z6841 Body Mass Index (BMI) 40.0 and over, adult: Secondary | ICD-10-CM

## 2018-11-14 MED ORDER — OZEMPIC (0.25 OR 0.5 MG/DOSE) 2 MG/1.5ML ~~LOC~~ SOPN: 0.2500 mg | PEN_INJECTOR | SUBCUTANEOUS | 0 refills | Status: DC

## 2018-11-14 MED ORDER — TRAZODONE HCL 100 MG PO TABS: 100.0000 mg | ORAL_TABLET | Freq: Every day | ORAL | 3 refills | Status: DC

## 2018-11-14 MED ORDER — BUSPIRONE HCL 7.5 MG PO TABS: 7.5000 mg | ORAL_TABLET | Freq: Three times a day (TID) | ORAL | 3 refills | Status: DC

## 2018-11-14 NOTE — Assessment & Plan Note (Signed)
Controlled Refill medications

## 2018-11-14 NOTE — Assessment & Plan Note (Addendum)
Well-controlled Pre-DM with A1c 5.7 Concern with morbid obesity, HTN, HLD  Plan:  1. See A&P Obesity - DC Metformin. START GLP1 Ozempic sample. Order when ready if covered. - indicated due to CAD, Morbid Obesity, PreDM 2. Encourage improved lifestyle - low carb, low sugar diet, reduce portion size, continue improving regular exercise 3. Follow-up 3 months PreDM A1c

## 2018-11-14 NOTE — Assessment & Plan Note (Signed)
Controlled hypothyroidism with lab results Continue current levothyroxine 42mcg daily

## 2018-11-14 NOTE — Assessment & Plan Note (Signed)
Followed by Cardiology Upcoming apt On med management - plavix, statin BB, ACEi, imdur nitrate Known CAD

## 2018-11-14 NOTE — Assessment & Plan Note (Signed)
Still gradual chronic weight gain, overall past >2-3 years following MI and depression, hysterectomy GYN surgery 09/2016 Improved lifestyle, limited benefits Previously w/ [facility] and Bariatric Surgery but now unable to afford surgery.  Plan: 1. Counseling on lifestyle modifications again encouraged to continue improve diet, low carb, low sugar, water intake - Goals to improve regular exercise as tolerated  Advised that based on weight, cardiovascular history with known CAD s/p MI, and PreDM - will plan to DC Metformin and START new GLP1 trial sample Ozempic dosing 0.25mg  weekly inj then up to 0.5mg  after 4 weeks, notify us if covered/affordable, copay card given, benefit risk side effect review, demo shown, sample given for 6 weeks.

## 2018-11-14 NOTE — Progress Notes (Signed)
Subjective:    Patient ID: Sheri [name], female    DOB: 1965/10/18, 53 y.o.   MRN: [id number]  Sheri [name] is a 53 y.o. female presenting on 11/14/2018 for Annual Exam   HPI  Here for Annual Physical and Lab Review.  Morbid Obesity BMI >44 / Pre-Diabetes Followed by [facility] previously, limited results. She was referred to Bariatric surgery, anticipated psychiatry evaluation but ultimately cost to her would be too much to proceed with surgery. She has not pursued this further. - She was on Metformin 500mg  daily previously 1 year ago, was due for refill, asking about this medication. - A1c result 5.7 previously 5.8 range, still at PreDM - Weight +10 lbs in 8 months, continued to increase Lifestyle - Diet: Improved low carb diet, limited portions, followed prior wt loss strategies. She eats mostly chicken as protein, air fried only, salads, drinks >40 oz water daily - Exercise: Increasing exercise as tolerated  Hypothyroidism Recent lab shows TSH and Free T4 normal. Continues to take Levothyroxine 56mcg daily Denies any symptoms  HYPERLIPIDEMIA: Last lipid panel 10/2018, mostly controlled except low HDL slightly - Currently taking Rosuvastatin 20mg  daily, tolerating well without side effects or myalgias  Vitamin D Improved. On VitD3 2k daily  CHRONIC HTN: Current Meds - Metoprolol 25mg  BID, lisinopril 5mg  daily   Reports good compliance, took meds today. Tolerating well, w/o complaints.  Followed by Cardiology for CAD - on Imdur, without angina, on Plavix, Metoprolol  Anxiety follow-up Still with anxiety, due for refill Buspar - medicine is improving her symptoms. Request re order Trazodone, sleep is improved.  Health Maintenance:  Colon CA Screening: Due for colon CA screening. Last done >5+ years ago, interested in cologuard. Asymptomatic. No fam history.  Due for Mammogram, will return to [facility] to discuss ordering mammogram  Due Flu Vaccine  today   Depression screen [facility] 2/9 11/14/2018 06/14/2018 05/17/2018  Decreased Interest 1 1 3   Down, Depressed, Hopeless 1 1 3   PHQ - 2 Score 2 2 6   Altered sleeping 0 3 3  Tired, decreased energy 2 1 3   Change in appetite 2 3 3   Feeling bad or failure about yourself  2 1 3   Trouble concentrating 2 1 3   Moving slowly or fidgety/restless 0 1 0  Suicidal thoughts 1 0 0  PHQ-9 Score 11 12 21   Difficult doing work/chores Somewhat difficult Somewhat difficult Extremely dIfficult  Some recent data might be hidden   GAD 7 : Generalized Anxiety Score 11/14/2018 06/14/2018 05/17/2018 10/10/2017  Nervous, Anxious, on Edge 1 1 3  0  Control/stop worrying 2 1 3  0  Worry too much - different things 2 1 3  0  Trouble relaxing 2 1 3  0  Restless 0 0 0 0  Easily annoyed or irritable 1 1 3  0  Afraid - awful might happen 1 1 3  0  Total GAD 7 Score 9 6 18  0  Anxiety Difficulty Somewhat difficult Somewhat difficult Somewhat difficult Not difficult at all     Past Medical History:  Diagnosis Date  . Abnormal uterine bleeding   . Anomaly heart   . Anxiety   . Coronary artery disease   . Dyspnea    with exertion  . Dysrhythmia   . Heart attack ([place]) 03/2014  . Heart disease   . History of C-section   . History of kidney stones   . Hx of heart artery stent   . Hypertension    Past  Surgical History:  Procedure Laterality Date  . CARDIAC CATHETERIZATION    . CARDIAC SURGERY    . [place]  . CHOLECYSTECTOMY    . CORONARY ANGIOPLASTY    . CYSTOSCOPY  10/10/2016   Procedure: CYSTOSCOPY;  Surgeon: Will Bonnet, MD;  Location: [facility] ORS;  Service: Gynecology;;  . Consuela Mimes W/ URETERAL STENT PLACEMENT Right 12/20/2017   Procedure: CYSTOSCOPY WITH RETROGRADE PYELOGRAM/URETERAL STENT PLACEMENT;  Surgeon: Billey Co, MD;  Location: [facility] ORS;  Service: Urology;  Laterality: Right;  . CYSTOSCOPY WITH STENT PLACEMENT Left 11/01/2015   Procedure: CYSTOSCOPY WITH  STENT PLACEMENT;  Surgeon: Hollice Espy, MD;  Location: [facility] ORS;  Service: Urology;  Laterality: Left;  . CYSTOSCOPY/RETROGRADE/URETEROSCOPY Bilateral 11/01/2015   Procedure: CYSTOSCOPY/RETROGRADE/URETEROSCOPY;  Surgeon: Hollice Espy, MD;  Location: [facility] ORS;  Service: Urology;  Laterality: Bilateral;  . DILATION AND CURETTAGE OF UTERUS  2011  . ENDOMETRIAL ABLATION  2011  . LAPAROSCOPIC HYSTERECTOMY Bilateral 10/10/2016   Procedure: HYSTERECTOMY TOTAL LAPAROSCOPIC BILATERAL SALPINGECTOMY;  Surgeon: Will Bonnet, MD;  Location: [facility] ORS;  Service: Gynecology;  Laterality: Bilateral;  . LEFT HEART CATHETERIZATION WITH CORONARY ANGIOGRAM N/A 04/06/2014   Procedure: LEFT HEART CATHETERIZATION WITH CORONARY ANGIOGRAM;  Surgeon: Jettie Booze, MD;  Location: [facility] CATH LAB;  Service: Cardiovascular;  Laterality: N/A;   Social History   Socioeconomic History  . Marital status: Married    Spouse name: Ysela Geelan   . Number of children: 3  . Years of education: Not on file  . Highest education level: Not on file  Occupational History  . 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 Financial resource strain: Not on file  . Food insecurity    Worry: Not on file    Inability: Not on file  . Transportation needs    Medical: Not on file    Non-medical: Not on file  Tobacco Use  . Smoking status: Former Smoker    Packs/day: 1.00    Types: Cigarettes  . Smokeless tobacco: Former Systems developer  . Tobacco comment: quit 20 years  Substance and Sexual Activity  . Alcohol use: No    Alcohol/week: 0.0 standard drinks  . Drug use: No  . Sexual activity: Yes    Birth control/protection: None  Lifestyle  . Physical activity    Days per week: Not on file    Minutes per session: Not on file  . Stress: Not on file  Relationships  . Social Herbalist on phone: Not on file    Gets together: Not on file    Attends religious service: Not on file    Active member of club or  organization: Not on file    Attends meetings of clubs or organizations: Not on file    Relationship status: Not on file  . Intimate partner violence    Fear of current or ex partner: Not on file    Emotionally abused: Not on file    Physically abused: Not on file    Forced sexual activity: Not on file  Other Topics Concern  . Not on file  Social History Narrative  . Not on file   Family History  Problem Relation Age of Onset  . Heart attack Father 62       died of MI at age 4  . High blood pressure Father   . High Cholesterol Father   . Heart disease Father   .  Sudden death Father   . Hypertension Mother   . Hyperlipidemia Mother   . Hypothyroidism Mother   . Kidney Stones Sister   . Kidney Stones Brother   . Stroke Paternal Grandfather   . Liver cancer Maternal Grandmother   . Kidney disease Neg Hx   . Bladder Cancer Neg Hx    Current Outpatient Medications on File Prior to Visit  Medication Sig  . Chromium 200 MCG CAPS Take by mouth.  . escitalopram (LEXAPRO) 20 MG tablet TAKE 1 TABLET BY MOUTH EVERY DAY  . isosorbide mononitrate (IMDUR) 30 MG 24 hr tablet Take 1 tablet (30 mg total) by mouth daily. Please keep upcoming appt for future refills.  Marland Kitchen levothyroxine (SYNTHROID) 25 MCG tablet TAKE 1 TABLET (25 MCG TOTAL) BY MOUTH DAILY BEFORE BREAKFAST.  Marland Kitchen lisinopril (ZESTRIL) 5 MG tablet Take 1 tablet (5 mg total) by mouth daily. Please keep upcoming appt in October with Dr. Irish Lack for future refills. Thank you  . Melatonin 1 MG CAPS Take 10 mg by mouth at bedtime.  . metoprolol tartrate (LOPRESSOR) 25 MG tablet Take 1 tablet (25 mg total) by mouth 2 (two) times daily. Please make yearly appt with Dr. Irish Lack for October for future refills. 1st attempt  . nitroGLYCERIN (NITROSTAT) 0.4 MG SL tablet Place 1 tablet (0.4 mg total) under the tongue every 5 (five) minutes x 3 doses as needed for chest pain.  Marland Kitchen ondansetron (ZOFRAN ODT) 4 MG disintegrating tablet Take 1 tablet (4  mg total) by mouth every 8 (eight) hours as needed for nausea or vomiting.  . rosuvastatin (CRESTOR) 20 MG tablet Take 1 tablet (20 mg total) by mouth daily. Please keep upcoming appt in October with Dr. Irish Lack for future refills. Thank you  . tamsulosin (FLOMAX) 0.4 MG CAPS capsule TAKE 1 CAPSULE BY MOUTH EVERY DAY  . Vitamin D, Cholecalciferol, 1000 units CAPS Take 1,000 Units by mouth daily.   . clopidogrel (PLAVIX) 75 MG tablet TAKE 1 TABLET BY MOUTH EVERY DAY  . cyclobenzaprine (FLEXERIL) 10 MG tablet Take 1 tablet (10 mg total) by mouth 3 (three) times daily as needed for muscle spasms.   No current facility-administered medications on file prior to visit.     Review of Systems  Constitutional: Positive for unexpected weight change. Negative for activity change, appetite change, chills, diaphoresis, fatigue and fever.  HENT: Negative for congestion and hearing loss.   Eyes: Negative for visual disturbance.  Respiratory: Negative for apnea, cough, chest tightness, shortness of breath and wheezing.   Cardiovascular: Negative for chest pain, palpitations and leg swelling.  Gastrointestinal: Negative for abdominal pain, anal bleeding, blood in stool, constipation, diarrhea, nausea and vomiting.  Endocrine: Negative for cold intolerance.  Genitourinary: Negative for difficulty urinating, dysuria, frequency and hematuria.  Musculoskeletal: Negative for arthralgias, back pain and neck pain.  Skin: Negative for rash.  Allergic/Immunologic: Negative for environmental allergies.  Neurological: Negative for dizziness, weakness, light-headedness, numbness and headaches.  Hematological: Negative for adenopathy.  Psychiatric/Behavioral: Negative for behavioral problems, dysphoric mood and sleep disturbance. The patient is not nervous/anxious.    Per HPI unless specifically indicated above     Objective:    BP 115/72   Pulse 67   Temp 98.2 F (36.8 C) (Oral)   Resp 16   Ht 5\' 5"  (1.651  m)   Wt 266 lb 12.8 oz (121 kg)   LMP 09/13/2016   BMI 44.40 kg/m   Wt Readings from Last 3 Encounters:  11/14/18  266 lb 12.8 oz (121 kg)  04/29/18 260 lb (117.9 kg)  02/07/18 255 lb (115.7 kg)    Physical Exam Vitals signs and nursing note reviewed.  Constitutional:      General: She is not in acute distress.    Appearance: She is well-developed. She is not diaphoretic.     Comments: Well-appearing, comfortable, cooperative, obese  HENT:     Head: Normocephalic and atraumatic.  Eyes:     General:        Right eye: No discharge.        Left eye: No discharge.     Conjunctiva/sclera: Conjunctivae normal.     Pupils: Pupils are equal, round, and reactive to light.  Neck:     Musculoskeletal: Normal range of motion and neck supple.     Thyroid: No thyromegaly.     Comments: No carotid bruits Cardiovascular:     Rate and Rhythm: Normal rate and regular rhythm.     Heart sounds: Normal heart sounds. No murmur.  Pulmonary:     Effort: Pulmonary effort is normal. No respiratory distress.     Breath sounds: Normal breath sounds. No wheezing or rales.  Abdominal:     General: Bowel sounds are normal. There is no distension.     Palpations: Abdomen is soft. There is no mass.     Tenderness: There is no abdominal tenderness.  Musculoskeletal: Normal range of motion.        General: No tenderness.     Comments: Upper / Lower Extremities: - Normal muscle tone, strength bilateral upper extremities 5/5, lower extremities 5/5  Lymphadenopathy:     Cervical: No cervical adenopathy.  Skin:    General: Skin is warm and dry.     Findings: No erythema or rash.  Neurological:     Mental Status: She is alert and oriented to person, place, and time.     Comments: Distal sensation intact to light touch all extremities  Psychiatric:        Behavior: Behavior normal.     Comments: Well groomed, good eye contact, normal speech and thoughts    Recent Labs    11/05/18 0826  HGBA1C 5.7*      Results for orders placed or performed in visit on 11/05/18  T4, free  Result Value Ref Range   Free T4 1.1 0.8 - 1.8 ng/dL  TSH  Result Value Ref Range   TSH 3.19 mIU/L  VITAMIN D 25 Hydroxy (Vit-D Deficiency, Fractures)  Result Value Ref Range   Vit D, 25-Hydroxy 32 30 - 100 ng/mL  Lipid panel  Result Value Ref Range   Cholesterol 133 <200 mg/dL   HDL 34 (L) > OR = 50 mg/dL   Triglycerides 102 <150 mg/dL   LDL Cholesterol (Calc) 80 mg/dL (calc)   Total CHOL/HDL Ratio 3.9 <5.0 (calc)   Non-HDL Cholesterol (Calc) 99 <130 mg/dL (calc)  COMPLETE METABOLIC PANEL WITH GFR  Result Value Ref Range   Glucose, Bld 118 (H) 65 - 99 mg/dL   BUN 21 7 - 25 mg/dL   Creat 0.71 0.50 - 1.05 mg/dL   GFR, Est Non African American 97 > OR = 60 mL/min/1.40m2   GFR, Est African American 112 > OR = 60 mL/min/1.16m2   BUN/Creatinine Ratio NOT APPLICABLE 6 - 22 (calc)   Sodium 140 135 - 146 mmol/L   Potassium 4.2 3.5 - 5.3 mmol/L   Chloride 104 98 - 110 mmol/L   CO2 28 20 -  32 mmol/L   Calcium 9.3 8.6 - 10.4 mg/dL   Total Protein 6.7 6.1 - 8.1 g/dL   Albumin 4.3 3.6 - 5.1 g/dL   Globulin 2.4 1.9 - 3.7 g/dL (calc)   AG Ratio 1.8 1.0 - 2.5 (calc)   Total Bilirubin 0.4 0.2 - 1.2 mg/dL   Alkaline phosphatase (APISO) 99 37 - 153 U/L   AST 30 10 - 35 U/L   ALT 42 (H) 6 - 29 U/L  CBC with Differential/Platelet  Result Value Ref Range   WBC 6.1 3.8 - 10.8 Thousand/uL   RBC 4.79 3.80 - 5.10 Million/uL   Hemoglobin 13.4 11.7 - 15.5 g/dL   HCT 41.0 35.0 - 45.0 %   MCV 85.6 80.0 - 100.0 fL   MCH 28.0 27.0 - 33.0 pg   MCHC 32.7 32.0 - 36.0 g/dL   RDW 13.3 11.0 - 15.0 %   Platelets 228 140 - 400 Thousand/uL   MPV 11.7 7.5 - 12.5 fL   Neutro Abs 3,459 1,500 - 7,800 cells/uL   Lymphs Abs 2,044 850 - 3,900 cells/uL   Absolute Monocytes 390 200 - 950 cells/uL   Eosinophils Absolute 159 15 - 500 cells/uL   Basophils Absolute 49 0 - 200 cells/uL   Neutrophils Relative % 56.7 %   Total Lymphocyte  33.5 %   Monocytes Relative 6.4 %   Eosinophils Relative 2.6 %   Basophils Relative 0.8 %  Hemoglobin A1c  Result Value Ref Range   Hgb A1c MFr Bld 5.7 (H) <5.7 % of total Hgb   Mean Plasma Glucose 117 (calc)   eAG (mmol/L) 6.5 (calc)      Assessment & Plan:   Problem List Items Addressed This Visit    Acquired hypothyroidism    Controlled hypothyroidism with lab results Continue current levothyroxine 35mcg daily      Coronary artery disease of native artery of native heart with stable angina pectoris ([place])    Followed by Cardiology Upcoming apt On med management - plavix, statin BB, ACEi, imdur nitrate Known CAD      Relevant Medications   Semaglutide,0.25 or 0.5MG /DOS, (OZEMPIC, 0.25 OR 0.5 MG/DOSE,) 2 MG/1.5ML SOPN   traZODone (DESYREL) 100 MG tablet   Hyperlipidemia    Mostly controlled cholesterol on statin and improving lifestyle Last lipid panel 10/2018 Known CAD  Plan: 1. Continue current meds - Rosuvastatin 20mg  daily 2. Encourage improved lifestyle - low carb/cholesterol, reduce portion size, continue improving regular exercise Follow-up      Morbid obesity with BMI of 40.0-44.9, adult ([place])    Still gradual chronic weight gain, overall past >2-3 years following MI and depression, hysterectomy GYN surgery 09/2016 Improved lifestyle, limited benefits Previously w/ [facility] and Bariatric Surgery but now unable to afford surgery.  Plan: 1. Counseling on lifestyle modifications again encouraged to continue improve diet, low carb, low sugar, water intake - Goals to improve regular exercise as tolerated  Advised that based on weight, cardiovascular history with known CAD s/p MI, and PreDM - will plan to DC Metformin and START new GLP1 trial sample Ozempic dosing 0.25mg  weekly inj then up to 0.5mg  after 4 weeks, notify us if covered/affordable, copay card given, benefit risk side effect review, demo shown, sample given for 6 weeks.      Relevant  Medications   Semaglutide,0.25 or 0.5MG /DOS, (OZEMPIC, 0.25 OR 0.5 MG/DOSE,) 2 MG/1.5ML SOPN   Prediabetes    Well-controlled Pre-DM with A1c 5.7 Concern with morbid obesity, HTN, HLD  Plan:  1. See A&P Obesity - DC Metformin. START GLP1 Ozempic sample. Order when ready if covered. - indicated due to CAD, Morbid Obesity, PreDM 2. Encourage improved lifestyle - low carb, low sugar diet, reduce portion size, continue improving regular exercise 3. Follow-up 3 months PreDM A1c      Relevant Medications   Semaglutide,0.25 or 0.5MG /DOS, (OZEMPIC, 0.25 OR 0.5 MG/DOSE,) 2 MG/1.5ML SOPN   Psychophysiological insomnia    Controlled Refill medications      Relevant Medications   traZODone (DESYREL) 100 MG tablet    Other Visit Diagnoses    Annual physical exam    -  Primary   Needs flu shot       Relevant Orders   Flu Vaccine QUAD 36+ mos IM (Completed)   Screen for colon cancer       Relevant Orders   Cologuard   Anxiety and depression       Relevant Medications   busPIRone (BUSPAR) 7.5 MG tablet   traZODone (DESYREL) 100 MG tablet     Updated Health Maintenance information - Flu vaccine today - Ordered Cologuard, she can check cost coverage w/ insurance if prefer. Reviewed benefit/risk of screening. - Recommend mammogram per [facility] Reviewed recent lab results with patient Encouraged improvement to lifestyle with diet and exercise - Goal of weight loss    Meds ordered this encounter  Medications  . Semaglutide,0.25 or 0.5MG /DOS, (OZEMPIC, 0.25 OR 0.5 MG/DOSE,) 2 MG/1.5ML SOPN    Sig: Inject 0.25 mg into the skin once a week. For first 4 weeks. Then increase dose to 0.5mg  weekly    Dispense:  1 pen    Refill:  0  . busPIRone (BUSPAR) 7.5 MG tablet    Sig: Take 1 tablet (7.5 mg total) by mouth 3 (three) times daily.    Dispense:  270 tablet    Refill:  3  . traZODone (DESYREL) 100 MG tablet    Sig: Take 1 tablet (100 mg total) by mouth at bedtime.    Dispense:  90  tablet    Refill:  3     Follow up plan: Return in about 3 months (around 02/14/2019) for 3 month weight management / PreDM A1c.  [name], DO [facility] 11/14/2018, 8:33 AM

## 2018-11-14 NOTE — Patient Instructions (Addendum)
Thank you for coming to the office today.  Ask your Cardiologist about fluid medication as discussed.  -------------------------------------  Call insurance find cost and coverage of the following  SAMPLE GIVEN - 1. Ozempic (Semaglutide injection) - start 0.53m weekly for 4 weeks then increase to 0.555mweekly - This one has best benefit of weight loss and reducing Cardiovascular events  2. Bydureon BCise (Exenatide ER) - once weekly - this is my preference, very good medicine well tolerated, less side effects of nausea, upset stomach. No dose changes. Cost and coverage is the problem, but we may be able to get it with the coupon card  3. Trulicity (Dulaglutide) - once weekly - this is very good one, usually one of my top choices as well, two doses, 0.75 (likely we would start) and 1.5 max dose. We can use coupon card here too  4. Victoza (Liraglutide) - once DAILY - 3 dose changes 0.6, 1.2 and 1.8, side effects nausea, upset stomach higher on this one but it is still very effective medicine  --------------------------------------------   For Mammogram screening for breast cancer  Call [facility] to schedule Mammogram  Colon Cancer Screening: - For all adults age 54+outine colon cancer screening is highly recommended.     - Recent guidelines from [place] starting age of 54 Early detection of colon cancer is important, because often there are no warning signs or symptoms, also if found early usually it can be cured. Late stage is hard to treat.  - If you are not interested in Colonoscopy screening (if done and normal you could be cleared for 5 to 10 years until next due), then Cologuard is an excellent alternative for screening test for Colon Cancer. It is highly sensitive for detecting DNA of colon cancer from even the earliest stages. Also, there is NO bowel prep required. - If Cologuard is NEGATIVE, then it is good for 3 years before next due - If  Cologuard is POSITIVE, then it is strongly advised to get a Colonoscopy, which allows the GI doctor to locate the source of the cancer or polyp (even very early stage) and treat it by removing it. -------------------------------------------------- If you would like to proceed with Cologuard (stool DNA test) - FIRST, call your insurance company and tell them you want to check cost of Cologuard tell them CPT Code [phone] may be completely covered and you could get for no cost, OR max cost without any coverage is about $600). Also, keep in mind if you do NOT open the kit, and decide not to do the test, you will NOT be charged, you should contact the company if you decide not to do the test. - If you want to proceed, you can notify usKoreaphone message, [facility] at next visit) and we will order it for you. The test kit will be delivered to you house within about 1 week. Follow instructions to collect sample, you may call the company for any help or questions, 24/7 telephone support at [phone]  Please schedule a Follow-up Appointment to: Return in about 3 months (around 02/14/2019) for 3 month weight management / PreDM A1c.  If you have any other questions or concerns, please feel free to call the office or send a message through My[place]You may also schedule an earlier appointment if necessary.  Additionally, you may be receiving a survey about your experience at our office within a few days to 1 week by e-mail  or mail. We value your feedback.  [name], DO [place]

## 2018-11-14 NOTE — Assessment & Plan Note (Signed)
Mostly controlled cholesterol on statin and improving lifestyle Last lipid panel 10/2018 Known CAD  Plan: 1. Continue current meds - Rosuvastatin 20mg  daily 2. Encourage improved lifestyle - low carb/cholesterol, reduce portion size, continue improving regular exercise Follow-up

## 2018-11-15 NOTE — Progress Notes (Signed)
Cardiology Office Note   Date:  11/18/2018   ID:  Sheri [name], DOB [birth year], MRN [id number]  PCP:  Olin Hauser, DO    No chief complaint on file.  CAD  Wt Readings from Last 3 Encounters:  11/18/18 264 lb (119.7 kg)  11/14/18 266 lb 12.8 oz (121 kg)  04/29/18 260 lb (117.9 kg)       History of Present Illness: Sheri [name] is a 53 y.o. female  who is being seen today for post ED f/u. She was seen at [facility] ED on 08/07/17 with complaints of periorbital tingling + tingling of her hands, dyspnea and palpitations. She also noted some mild chest discomfort. W/u in the ED was negative. EKG was nonischemic and troponin's were negative x 2. CXR unremarkable. K WNL but Ca was mildly low at 8.8. CBC normal. The EDP felt that her symptoms were possibly due to anxiety. She was offered admission for overnight observation but declined.   In summary, shehas a known history of CAD. In March 2016,she was admitted for an inferior MI. Left heart catheterization showed culprit vessel to be anoccluded RCA for which she underwent PCI plus stenting. 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 This was treated medically.The left main coronary artery is absent. 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 She was placed on dual antiplatelet therapy. Several months later, she was seenin October 2016 for exertional dyspnea. There was question as to whether or not this was her anginal equivalent,secondary to her circumflex disease. Subsequently she had a a nuclear stress test to assess for underlying ischemia. This was a low risk study. There was asmall, mild intensity lateral fixed perfusion defect which was felt to be attenuation artifact. There was no significant reversible ischemia. LVEF was 64% with normal wall motion. 2D echo also confirmed normal LVEF and BNP was also  normal. Additionalpast medical history also includes hypertension and hyperlipidemia for which she is being treated for.  Se had some tingling sensations but this improved with calcium supplements.    She was planning bariatric surgery- Sheri [name].  Due to financial issues, she stopped that process.    She has started a shot for DM.  She stays busy.  She does feel that she sweats easily.  She was swimming in the pool over the summer.  She was swimming laps without any issues.  Swimming less as the weather has gotten cooler.  It is a large pool, 22K gallons.  Denies : Chest pain. Dizziness.  Orthopnea. Palpitations. Paroxysmal nocturnal dyspnea. Shortness of breath. Syncope.   She is distancing and wearing a mask.  Occasionally feels her hands are tight and she is retaining fluid    Past Medical History:  Diagnosis Date  . Abnormal uterine bleeding   . Anomaly heart   . Anxiety   . Coronary artery disease   . Dyspnea    with exertion  . Dysrhythmia   . Heart attack ([place]) 03/2014  . Heart disease   . History of C-section   . History of kidney stones   . Hx of heart artery stent   . Hypertension     Past Surgical History:  Procedure Laterality Date  . CARDIAC CATHETERIZATION    . CARDIAC SURGERY    . [place]  . CHOLECYSTECTOMY    . CORONARY  ANGIOPLASTY    . CYSTOSCOPY  10/10/2016   Procedure: CYSTOSCOPY;  Surgeon: Will Bonnet, MD;  Location: [facility] ORS;  Service: Gynecology;;  . Consuela Mimes W/ URETERAL STENT PLACEMENT Right 12/20/2017   Procedure: CYSTOSCOPY WITH RETROGRADE PYELOGRAM/URETERAL STENT PLACEMENT;  Surgeon: Billey Co, MD;  Location: [facility] ORS;  Service: Urology;  Laterality: Right;  . CYSTOSCOPY WITH STENT PLACEMENT Left 11/01/2015   Procedure: CYSTOSCOPY WITH STENT PLACEMENT;  Surgeon: Hollice Espy, MD;  Location: [facility] ORS;  Service: Urology;  Laterality: Left;  . CYSTOSCOPY/RETROGRADE/URETEROSCOPY Bilateral  11/01/2015   Procedure: CYSTOSCOPY/RETROGRADE/URETEROSCOPY;  Surgeon: Hollice Espy, MD;  Location: [facility] ORS;  Service: Urology;  Laterality: Bilateral;  . DILATION AND CURETTAGE OF UTERUS  2011  . ENDOMETRIAL ABLATION  2011  . LAPAROSCOPIC HYSTERECTOMY Bilateral 10/10/2016   Procedure: HYSTERECTOMY TOTAL LAPAROSCOPIC BILATERAL SALPINGECTOMY;  Surgeon: Will Bonnet, MD;  Location: [facility] ORS;  Service: Gynecology;  Laterality: Bilateral;  . LEFT HEART CATHETERIZATION WITH CORONARY ANGIOGRAM N/A 04/06/2014   Procedure: LEFT HEART CATHETERIZATION WITH CORONARY ANGIOGRAM;  Surgeon: Jettie Booze, MD;  Location: [facility] C[facility] LAB;  Service: Cardiovascular;  Laterality: N/A;     Current Outpatient Medications  Medication Sig Dispense Refill  . busPIRone (BUSPAR) 7.5 MG tablet Take 1 tablet (7.5 mg total) by mouth 3 (three) times daily. 270 tablet 3  . Chromium 200 MCG CAPS Take by mouth. 30 each 0  . clopidogrel (PLAVIX) 75 MG tablet TAKE 1 TABLET BY MOUTH EVERY DAY 90 tablet 0  . escitalopram (LEXAPRO) 20 MG tablet TAKE 1 TABLET BY MOUTH EVERY DAY 90 tablet 1  . isosorbide mononitrate (IMDUR) 30 MG 24 hr tablet Take 1 tablet (30 mg total) by mouth daily. Please keep upcoming appt for future refills. 90 tablet 0  . levothyroxine (SYNTHROID) 25 MCG tablet TAKE 1 TABLET (25 MCG TOTAL) BY MOUTH DAILY BEFORE BREAKFAST. 90 tablet 1  . lisinopril (ZESTRIL) 5 MG tablet Take 1 tablet (5 mg total) by mouth daily. Please keep upcoming appt in October with Dr. Irish Lack for future refills. Thank you 30 tablet 1  . Melatonin 1 MG CAPS Take 10 mg by mouth at bedtime.  0  . metoprolol tartrate (LOPRESSOR) 25 MG tablet Take 1 tablet (25 mg total) by mouth 2 (two) times daily. Please make yearly appt with Dr. Irish Lack for October for future refills. 1st attempt 180 tablet 0  . nitroGLYCERIN (NITROSTAT) 0.4 MG SL tablet Place 1 tablet (0.4 mg total) under the tongue every 5 (five) minutes x 3 doses as needed for  chest pain. 25 tablet 2  . ondansetron (ZOFRAN ODT) 4 MG disintegrating tablet Take 1 tablet (4 mg total) by mouth every 8 (eight) hours as needed for nausea or vomiting. 20 tablet 0  . rosuvastatin (CRESTOR) 20 MG tablet Take 1 tablet (20 mg total) by mouth daily. Please keep upcoming appt in October with Dr. Irish Lack for future refills. Thank you 90 tablet 0  . Semaglutide,0.25 or 0.5MG /DOS, (OZEMPIC, 0.25 OR 0.5 MG/DOSE,) 2 MG/1.5ML SOPN Inject 0.25 mg into the skin once a week. For first 4 weeks. Then increase dose to 0.5mg  weekly 1 pen 0  . tamsulosin (FLOMAX) 0.4 MG CAPS capsule TAKE 1 CAPSULE BY MOUTH EVERY DAY 30 capsule 3  . traZODone (DESYREL) 100 MG tablet Take 1 tablet (100 mg total) by mouth at bedtime. 90 tablet 3  . Vitamin D, Cholecalciferol, 1000 units CAPS Take 1,000 Units by mouth daily.      No current  facility-administered medications for this visit.     Allergies:   Latex and Penicillins    Social History:  The patient  reports that she has quit smoking. Her smoking use included cigarettes. She smoked 1.00 pack per day. She has quit using smokeless tobacco. She reports that she does not drink alcohol or use drugs.   Family History:  The patient's family history includes Heart attack (age of onset: 79) in her father; Heart disease in her father; High Cholesterol in her father; High blood pressure in her father; Hyperlipidemia in her mother; Hypertension in her mother; Hypothyroidism in her mother; Kidney Stones in her brother and sister; Liver cancer in her maternal grandmother; Stroke in her paternal grandfather; Sudden death in her father.    ROS:  Please see the history of present illness.   Otherwise, review of systems are positive for weight gain.   All other systems are reviewed and negative.    PHYSICAL EXAM: VS:  BP 116/86   Pulse 73   Ht 5\' 5"  (1.651 m)   Wt 264 lb (119.7 kg)   LMP 09/13/2016   SpO2 96%   BMI 43.93 kg/m  , BMI Body mass index is 43.93  kg/m. GEN: Well nourished, well developed, in no acute distress  HEENT: normal  Neck: no JVD, carotid bruits, or masses Cardiac: RRR; no murmurs, rubs, or gallops,no edema  Respiratory:  clear to auscultation bilaterally, normal work of breathing GI: soft, nontender, nondistended, + BS, obese MS: no deformity or atrophy  Skin: warm and dry, no rash Neuro:  Strength and sensation are intact Psych: euthymic mood, full affect   EKG:   The ekg ordered today demonstrates NSR, no ST changes   Recent Labs: 11/05/2018: ALT 42; BUN 21; Creat 0.71; Hemoglobin 13.4; Platelets 228; Potassium 4.2; Sodium 140; TSH 3.19   Lipid Panel    Component Value Date/Time   CHOL 133 11/05/2018 0826   CHOL 143 05/14/2017 1145   TRIG 102 11/05/2018 0826   HDL 34 (L) 11/05/2018 0826   HDL 32 (L) 05/14/2017 1145   CHOLHDL 3.9 11/05/2018 0826   VLDL 24.8 07/27/2014 0943   LDLCALC 80 11/05/2018 0826     Other studies Reviewed: Additional studies/ records that were reviewed today with results demonstrating: labs reviewed.   ASSESSMENT AND PLAN:  1. CAD/Old MI: No angina.  Continue aggressive secondary prevention.  Now has to change exercise to indoor since weather is changing.  SHe has a treadmill.  Reassuring that she was swimming laps without difficulty over the past summer, when the weather was warmer. 2. DM: A1C 5.7. Well controlled. Followed by PCP. 3. HTN: The current medical regimen is effective;  continue present plan and medications. 4. Dyslipidemia: LDL 80 in 10/2018.  Continue statin 5. Morbid obesity:  Weight loss surgery with Dr. Hassell Done- plans postponed.  Now trying to lose weight with diet and exercise.  She has some intermittent swelling.  OK to use Lasix 20 mg /day, prn up to 3 times per week for when she feels some swelling.     Current medicines are reviewed at length with the patient today.  The patient concerns regarding her medicines were addressed.  The following changes have  been made:  No change  Labs/ tests ordered today include: checked with PMD No orders of the defined types were placed in this encounter.   Recommend 150 minutes/week of aerobic exercise Low fat, low carb, high fiber diet recommended  Disposition:  FU in 1 year in person   Signed, Larae Grooms, MD  11/18/2018 9:18 AM    [facility] [place] [address], [place], [place]  [address] Phone: [phone]; Fax: [phone]

## 2018-11-18 ENCOUNTER — Encounter: Payer: Self-pay | Admitting: Interventional Cardiology

## 2018-11-18 ENCOUNTER — Ambulatory Visit: Payer: Self-pay | Admitting: Interventional Cardiology

## 2018-11-18 ENCOUNTER — Other Ambulatory Visit: Payer: Self-pay

## 2018-11-18 VITALS — BP 116/86 | HR 73 | Ht 65.0 in | Wt 264.0 lb

## 2018-11-18 DIAGNOSIS — I252 Old myocardial infarction: Secondary | ICD-10-CM

## 2018-11-18 DIAGNOSIS — R609 Edema, unspecified: Secondary | ICD-10-CM

## 2018-11-18 DIAGNOSIS — I2510 Atherosclerotic heart disease of native coronary artery without angina pectoris: Secondary | ICD-10-CM

## 2018-11-18 DIAGNOSIS — E782 Mixed hyperlipidemia: Secondary | ICD-10-CM

## 2018-11-18 MED ORDER — FUROSEMIDE 20 MG PO TABS: 20.0000 mg | ORAL_TABLET | Freq: Every day | ORAL | 0 refills | Status: DC | PRN

## 2018-11-18 NOTE — Patient Instructions (Addendum)
Medication Instructions:  Your physician has recommended you make the following change in your medication:   Furosemide (lasix) 20 mg tablet: Take 1 tablet by mouth AS NEEDED for swelling or shortness of breath   *If you need a refill on your cardiac medications before your next appointment, please call your pharmacy*  Lab Work: None ordered If you have labs (blood work) drawn today and your tests are completely normal, you will receive your results only by:  [place] (if you have MyChart) OR  A paper copy in the mail If you have any lab test that is abnormal or we need to change your treatment, we will call you to review the results.  Testing/Procedures: None ordered  Follow-Up: At [facility], you and your health needs are our priority.  As part of our continuing mission to provide you with exceptional heart care, we have created designated Provider Care Teams.  These Care Teams include your primary Cardiologist (physician) and Advanced Practice Providers (APPs -  Physician Assistants and Nurse Practitioners) who all work together to provide you with the care you need, when you need it.  Your next appointment:   12 months  The format for your next appointment:   In Person  Provider:   You may see Casandra Doffing, MD or one of the following Advanced Practice Providers on your designated Care Team:    Melina Copa, PA-C  Ermalinda Barrios, PA-C   Other Instructions  Heart-Healthy Eating Plan Heart-healthy meal planning includes:  Eating less unhealthy fats.  Eating more healthy fats.  Making other changes in your diet. Talk with your doctor or a diet specialist (dietitian) to create an eating plan that is right for you. What is my plan? Your doctor may recommend an eating plan that includes:  Total fat: ______% or less of total calories a day.  Saturated fat: ______% or less of total calories a day.  Cholesterol: less than _________mg a day. What are tips for  following this plan? Cooking Avoid frying your food. Try to bake, boil, grill, or broil it instead. You can also reduce fat by:  Removing the skin from poultry.  Removing all visible fats from meats.  Steaming vegetables in water or broth. Meal planning   At meals, divide your plate into four equal parts: ? Fill one-half of your plate with vegetables and green salads. ? Fill one-fourth of your plate with whole grains. ? Fill one-fourth of your plate with lean protein foods.  Eat 4-5 servings of vegetables per day. A serving of vegetables is: ? 1 cup of raw or cooked vegetables. ? 2 cups of raw leafy greens.  Eat 4-5 servings of fruit per day. A serving of fruit is: ? 1 medium whole fruit. ?  cup of dried fruit. ?  cup of fresh, frozen, or canned fruit. ?  cup of 100% fruit juice.  Eat more foods that have soluble fiber. These are apples, broccoli, carrots, beans, peas, and barley. Try to get 20-30 g of fiber per day.  Eat 4-5 servings of nuts, legumes, and seeds per week: ? 1 serving of dried beans or legumes equals  cup after being cooked. ? 1 serving of nuts is  cup. ? 1 serving of seeds equals 1 tablespoon. General information  Eat more home-cooked food. Eat less restaurant, buffet, and fast food.  Limit or avoid alcohol.  Limit foods that are high in starch and sugar.  Avoid fried foods.  Lose weight if you  are overweight.  Keep track of how much salt (sodium) you eat. This is important if you have high blood pressure. Ask your doctor to tell you more about this.  Try to add vegetarian meals each week. Fats  Choose healthy fats. These include olive oil and canola oil, flaxseeds, walnuts, almonds, and seeds.  Eat more omega-3 fats. These include salmon, mackerel, sardines, tuna, flaxseed oil, and ground flaxseeds. Try to eat fish at least 2 times each week.  Check food labels. Avoid foods with trans fats or high amounts of saturated fat.  Limit  saturated fats. ? These are often found in animal products, such as meats, butter, and cream. ? These are also found in plant foods, such as palm oil, palm kernel oil, and coconut oil.  Avoid foods with partially hydrogenated oils in them. These have trans fats. Examples are stick margarine, some tub margarines, cookies, crackers, and other baked goods. What foods can I eat? Fruits All fresh, canned (in natural juice), or frozen fruits. Vegetables Fresh or frozen vegetables (raw, steamed, roasted, or grilled). Green salads. Grains Most grains. Choose whole wheat and whole grains most of the time. Rice and pasta, including brown rice and pastas made with whole wheat. Meats and other proteins Lean, well-trimmed beef, veal, pork, and lamb. Chicken and [place] without skin. All fish and shellfish. Wild duck, rabbit, pheasant, and venison. Egg whites or low-cholesterol egg substitutes. Dried beans, peas, lentils, and tofu. Seeds and most nuts. Dairy Low-fat or nonfat cheeses, including ricotta and mozzarella. Skim or 1% milk that is liquid, powdered, or evaporated. Buttermilk that is made with low-fat milk. Nonfat or low-fat yogurt. Fats and oils Non-hydrogenated (trans-free) margarines. Vegetable oils, including soybean, sesame, sunflower, olive, peanut, safflower, corn, canola, and cottonseed. Salad dressings or mayonnaise made with a vegetable oil. Beverages Mineral water. Coffee and tea. Diet carbonated beverages. Sweets and desserts Sherbet, gelatin, and fruit ice. Small amounts of dark chocolate. Limit all sweets and desserts. Seasonings and condiments All seasonings and condiments. The items listed above may not be a complete list of foods and drinks you can eat. Contact a dietitian for more options. What foods should I avoid? Fruits Canned fruit in heavy syrup. Fruit in cream or butter sauce. Fried fruit. Limit coconut. Vegetables Vegetables cooked in cheese, cream, or butter sauce.  Fried vegetables. Grains Breads that are made with saturated or trans fats, oils, or whole milk. Croissants. Sweet rolls. Donuts. High-fat crackers, such as cheese crackers. Meats and other proteins Fatty meats, such as hot dogs, ribs, sausage, bacon, rib-eye roast or steak. High-fat deli meats, such as salami and bologna. Caviar. Domestic duck and goose. Organ meats, such as liver. Dairy Cream, sour cream, cream cheese, and creamed cottage cheese. Whole-milk cheeses. Whole or 2% milk that is liquid, evaporated, or condensed. Whole buttermilk. Cream sauce or high-fat cheese sauce. Yogurt that is made from whole milk. Fats and oils Meat fat, or shortening. Cocoa butter, hydrogenated oils, palm oil, coconut oil, palm kernel oil. Solid fats and shortenings, including bacon fat, salt pork, lard, and butter. Nondairy cream substitutes. Salad dressings with cheese or sour cream. Beverages Regular sodas and juice drinks with added sugar. Sweets and desserts Frosting. Pudding. Cookies. Cakes. Pies. Milk chocolate or white chocolate. Buttered syrups. Full-fat ice cream or ice cream drinks. The items listed above may not be a complete list of foods and drinks to avoid. Contact a dietitian for more information. Summary  Heart-healthy meal planning includes eating less unhealthy fats,  eating more healthy fats, and making other changes in your diet.  Eat a balanced diet. This includes fruits and vegetables, low-fat or nonfat dairy, lean protein, nuts and legumes, whole grains, and heart-healthy oils and fats. This information is not intended to replace advice given to you by your health care provider. Make sure you discuss any questions you have with your health care provider. Document Released: 07/18/2011 Document Revised: 03/22/2017 Document Reviewed: 02/23/2017 Elsevier Patient Education  2020 [employer].

## 2018-11-26 ENCOUNTER — Other Ambulatory Visit: Payer: Self-pay | Admitting: Family Medicine

## 2018-11-26 DIAGNOSIS — E039 Hypothyroidism, unspecified: Secondary | ICD-10-CM

## 2018-11-29 ENCOUNTER — Other Ambulatory Visit: Payer: Self-pay | Admitting: Interventional Cardiology

## 2018-12-01 ENCOUNTER — Other Ambulatory Visit: Payer: Self-pay | Admitting: Family Medicine

## 2018-12-01 DIAGNOSIS — F419 Anxiety disorder, unspecified: Secondary | ICD-10-CM

## 2018-12-01 DIAGNOSIS — F32A Depression, unspecified: Secondary | ICD-10-CM

## 2018-12-01 DIAGNOSIS — F329 Major depressive disorder, single episode, unspecified: Secondary | ICD-10-CM

## 2018-12-03 ENCOUNTER — Other Ambulatory Visit: Payer: Self-pay

## 2018-12-03 MED ORDER — METOPROLOL TARTRATE 25 MG PO TABS: 25.0000 mg | ORAL_TABLET | Freq: Two times a day (BID) | ORAL | 3 refills | Status: DC

## 2018-12-03 MED ORDER — METOPROLOL TARTRATE 25 MG PO TABS: 25.0000 mg | ORAL_TABLET | Freq: Two times a day (BID) | ORAL | 0 refills | Status: DC

## 2018-12-05 DIAGNOSIS — Z6841 Body Mass Index (BMI) 40.0 and over, adult: Secondary | ICD-10-CM

## 2018-12-05 DIAGNOSIS — R7303 Prediabetes: Secondary | ICD-10-CM

## 2018-12-05 DIAGNOSIS — I25118 Atherosclerotic heart disease of native coronary artery with other forms of angina pectoris: Secondary | ICD-10-CM

## 2018-12-05 NOTE — Telephone Encounter (Signed)
Alright. Thanks. I believe Ozempic would be covered or approved for heart disease (Coronary Artery Disease)  If we can complete the PA - even if cost is high, she can use the discount copay cards AFTER it is approved to reduce her cost.  Diagnosis: - Coronary Artery Disease (I25.118) - Pre-Diabetes (R73.03) - Morbid Obesity (E66.01)  She failed Metformin (ineffective)  [name], DO [facility] 12/05/2018, 2:59 PM

## 2018-12-10 ENCOUNTER — Other Ambulatory Visit: Payer: Self-pay | Admitting: Interventional Cardiology

## 2018-12-10 MED ORDER — OZEMPIC (0.25 OR 0.5 MG/DOSE) 2 MG/1.5ML ~~LOC~~ SOPN: 0.2500 mg | PEN_INJECTOR | SUBCUTANEOUS | 2 refills | Status: DC

## 2018-12-12 ENCOUNTER — Other Ambulatory Visit: Payer: Self-pay | Admitting: Interventional Cardiology

## 2018-12-12 ENCOUNTER — Other Ambulatory Visit: Payer: Self-pay | Admitting: Urology

## 2018-12-12 DIAGNOSIS — N200 Calculus of kidney: Secondary | ICD-10-CM

## 2018-12-16 ENCOUNTER — Telehealth: Payer: Self-pay | Admitting: Family Medicine

## 2018-12-16 NOTE — Telephone Encounter (Signed)
The was notified per [facility].

## 2018-12-25 MED ORDER — FUROSEMIDE 20 MG PO TABS: 20.0000 mg | ORAL_TABLET | Freq: Every day | ORAL | 11 refills | Status: DC

## 2019-01-02 ENCOUNTER — Other Ambulatory Visit: Payer: Self-pay | Admitting: Interventional Cardiology

## 2019-01-07 ENCOUNTER — Telehealth: Payer: Self-pay | Admitting: Interventional Cardiology

## 2019-01-07 DIAGNOSIS — R609 Edema, unspecified: Secondary | ICD-10-CM

## 2019-01-07 NOTE — Telephone Encounter (Signed)
Patient needs BMET since increasing lasix to QD to determine if we need to start a potassium supplement. Patient scheduled for BMET tomorrow.

## 2019-01-07 NOTE — Telephone Encounter (Signed)
Patient called to see if she could get her lab work, but there are no orders. She states Dr. Irish Lack wanted her to have her blood work done.

## 2019-01-08 ENCOUNTER — Other Ambulatory Visit: Payer: Self-pay

## 2019-01-08 ENCOUNTER — Other Ambulatory Visit: Payer: BC Managed Care – PPO | Admitting: *Deleted

## 2019-01-08 DIAGNOSIS — R609 Edema, unspecified: Secondary | ICD-10-CM

## 2019-01-08 LAB — BASIC METABOLIC PANEL
BUN/Creatinine Ratio: 30 — ABNORMAL HIGH (ref 9–23)
BUN: 19 mg/dL (ref 6–24)
CO2: 23 mmol/L (ref 20–29)
Calcium: 9.6 mg/dL (ref 8.7–10.2)
Chloride: 102 mmol/L (ref 96–106)
Creatinine, Ser: 0.64 mg/dL (ref 0.57–1.00)
GFR calc Af Amer: 117 mL/min/{1.73_m2} (ref >59)
GFR calc non Af Amer: 101 mL/min/{1.73_m2} (ref >59)
Glucose: 86 mg/dL (ref 65–99)
Potassium: 4.5 mmol/L (ref 3.5–5.2)
Sodium: 139 mmol/L (ref 134–144)

## 2019-01-15 ENCOUNTER — Other Ambulatory Visit: Payer: Self-pay | Admitting: Interventional Cardiology

## 2019-01-22 ENCOUNTER — Other Ambulatory Visit: Payer: Self-pay | Admitting: Interventional Cardiology

## 2019-01-23 ENCOUNTER — Other Ambulatory Visit: Payer: Self-pay | Admitting: Family Medicine

## 2019-01-23 DIAGNOSIS — F329 Major depressive disorder, single episode, unspecified: Secondary | ICD-10-CM

## 2019-01-23 DIAGNOSIS — F32A Depression, unspecified: Secondary | ICD-10-CM

## 2019-01-29 ENCOUNTER — Ambulatory Visit: Payer: BC Managed Care – PPO | Attending: Internal Medicine

## 2019-01-29 DIAGNOSIS — Z20822 Contact with and (suspected) exposure to covid-19: Secondary | ICD-10-CM

## 2019-01-29 DIAGNOSIS — Z20828 Contact with and (suspected) exposure to other viral communicable diseases: Secondary | ICD-10-CM | POA: Diagnosis not present

## 2019-01-30 LAB — NOVEL CORONAVIRUS, NAA: SARS-CoV-2, NAA: NOT DETECTED

## 2019-02-01 ENCOUNTER — Other Ambulatory Visit: Payer: Self-pay

## 2019-02-01 ENCOUNTER — Emergency Department: Payer: BC Managed Care – PPO

## 2019-02-01 ENCOUNTER — Emergency Department
Admission: EM | Admit: 2019-02-01 | Discharge: 2019-02-02 | Disposition: A | Discharge status: Home / Self Care | Payer: BC Managed Care – PPO | Attending: Emergency Medicine | Admitting: Emergency Medicine

## 2019-02-01 ENCOUNTER — Encounter: Payer: Self-pay | Admitting: Emergency Medicine

## 2019-02-01 DIAGNOSIS — Z87891 Personal history of nicotine dependence: Secondary | ICD-10-CM | POA: Diagnosis not present

## 2019-02-01 DIAGNOSIS — M533 Sacrococcygeal disorders, not elsewhere classified: Secondary | ICD-10-CM | POA: Insufficient documentation

## 2019-02-01 DIAGNOSIS — M545 Low back pain: Secondary | ICD-10-CM | POA: Diagnosis not present

## 2019-02-01 DIAGNOSIS — R109 Unspecified abdominal pain: Secondary | ICD-10-CM | POA: Insufficient documentation

## 2019-02-01 DIAGNOSIS — I2510 Atherosclerotic heart disease of native coronary artery without angina pectoris: Secondary | ICD-10-CM | POA: Diagnosis not present

## 2019-02-01 DIAGNOSIS — Z79899 Other long term (current) drug therapy: Secondary | ICD-10-CM | POA: Diagnosis not present

## 2019-02-01 DIAGNOSIS — Z91040 Latex allergy status: Secondary | ICD-10-CM | POA: Diagnosis not present

## 2019-02-01 DIAGNOSIS — M549 Dorsalgia, unspecified: Secondary | ICD-10-CM | POA: Diagnosis not present

## 2019-02-01 DIAGNOSIS — I10 Essential (primary) hypertension: Secondary | ICD-10-CM | POA: Insufficient documentation

## 2019-02-01 LAB — CBC
HCT: 37.9 % (ref 36.0–46.0)
Hemoglobin: 13.2 g/dL (ref 12.0–15.0)
MCH: 28.2 pg (ref 26.0–34.0)
MCHC: 34.8 g/dL (ref 30.0–36.0)
MCV: 81 fL (ref 80.0–100.0)
Platelets: 247 10*3/uL (ref 150–400)
RBC: 4.68 MIL/uL (ref 3.87–5.11)
RDW: 13.2 % (ref 11.5–15.5)
WBC: 9.6 10*3/uL (ref 4.0–10.5)
nRBC: 0 % (ref 0.0–0.2)

## 2019-02-01 LAB — BASIC METABOLIC PANEL
Anion gap: 11 (ref 5–15)
BUN: 19 mg/dL (ref 6–20)
CO2: 24 mmol/L (ref 22–32)
Calcium: 9.2 mg/dL (ref 8.9–10.3)
Chloride: 105 mmol/L (ref 98–111)
Creatinine, Ser: 0.64 mg/dL (ref 0.44–1.00)
GFR calc Af Amer: >60 mL/min (ref >60)
GFR calc non Af Amer: >60 mL/min (ref >60)
Glucose, Bld: 101 mg/dL — ABNORMAL HIGH (ref 70–99)
Potassium: 4.1 mmol/L (ref 3.5–5.1)
Sodium: 140 mmol/L (ref 135–145)

## 2019-02-01 LAB — URINALYSIS, COMPLETE (UACMP) WITH MICROSCOPIC
Bilirubin Urine: NEGATIVE
Glucose, UA: NEGATIVE mg/dL
Hgb urine dipstick: NEGATIVE
Ketones, ur: 5 mg/dL — AB
Leukocytes,Ua: NEGATIVE
Nitrite: NEGATIVE
Protein, ur: 30 mg/dL — AB
Specific Gravity, Urine: 1.026 (ref 1.005–1.030)
pH: 5 (ref 5.0–8.0)

## 2019-02-01 LAB — TROPONIN I (HIGH SENSITIVITY): Troponin I (High Sensitivity): <2 ng/L (ref <18)

## 2019-02-01 MED ORDER — LIDOCAINE 5 % EX PTCH: 1.0000 | MEDICATED_PATCH | Freq: Two times a day (BID) | CUTANEOUS | 0 refills | Status: DC

## 2019-02-01 MED ORDER — ACETAMINOPHEN 325 MG PO TABS: 650.0000 mg | ORAL_TABLET | Freq: Four times a day (QID) | ORAL | 0 refills | Status: AC | PRN

## 2019-02-01 MED ORDER — FENTANYL CITRATE (PF) 100 MCG/2ML IJ SOLN
50.0000 ug | Freq: Once | INTRAMUSCULAR | Status: AC
  Administered 2019-02-01: 50 ug via INTRAMUSCULAR
  Filled 2019-02-01: qty 2

## 2019-02-01 MED ORDER — SODIUM CHLORIDE 0.9% FLUSH: 3.0000 mL | Freq: Once | INTRAVENOUS | Status: DC

## 2019-02-01 MED ORDER — NAPROXEN 500 MG PO TABS: 500.0000 mg | ORAL_TABLET | Freq: Two times a day (BID) | ORAL | 0 refills | Status: AC

## 2019-02-01 MED ORDER — LIDOCAINE 5 % EX PTCH
1.0000 | MEDICATED_PATCH | CUTANEOUS | Status: DC
  Administered 2019-02-01: 23:00:00 1 via TRANSDERMAL
  Filled 2019-02-01: qty 1

## 2019-02-01 MED ORDER — OXYCODONE HCL 5 MG PO TABS: 5.0000 mg | ORAL_TABLET | Freq: Four times a day (QID) | ORAL | 0 refills | Status: DC | PRN

## 2019-02-01 NOTE — ED Provider Notes (Signed)
[facility] Emergency Department Provider Note  ____________________________________________  Time seen: Approximately 9:03 PM  I have reviewed the triage vital signs and the nursing notes.   HISTORY  Chief Complaint Back Pain and Palpitations    HPI Sheri [name] is a 54 y.o. female with a history of CAD, kidney stones, hypertension  who was in her usual state of health until she woke up this morning with right lower back pain.  She indicates an area which is tender to the touch which corresponds to her SI joint on the right.  Denies any falls or trauma.  No dysuria frequency urgency.  Worse with movement, no alleviating factors.  Severe.  Sharp.  No chest pain shortness of breath fevers or chills.  Feels like prior kidney stone disease.  Nonradiating.  No lower extremity paresthesia or weakness.  No bowel or bladder incontinence or retention.  She initially went to urgent care, but her urine panel was normal without hematuria so they sent her to the ED for further evaluation.   Past Medical History:  Diagnosis Date  . Abnormal uterine bleeding   . Anomaly heart   . Anxiety   . Coronary artery disease   . Dyspnea    with exertion  . Dysrhythmia   . Heart attack ([place]) 03/2014  . Heart disease   . History of C-section   . History of kidney stones   . Hx of heart artery stent   . Hypertension      Patient Active Problem List   Diagnosis Date Noted  . Recurrent major depression in partial remission ([place]) 06/14/2018  . Psychophysiological insomnia 06/14/2018  . Right ureteral stone 12/20/2017  . Prediabetes 07/04/2017  . Essential hypertension 07/04/2017  . Status post laparoscopic hysterectomy 10/10/2016  . Vitamin D deficiency 09/21/2016  . Chronic fatigue 09/21/2016  . Acquired hypothyroidism 09/21/2016  . Menorrhagia with irregular cycle 08/29/2016  . Old MI (myocardial infarction) 05/03/2016  . Nocturnal leg cramps 12/30/2015  . Right  nephrolithiasis 12/01/2015  . Pulmonary nodule 12/01/2015  . GAD (generalized anxiety disorder) 06/21/2015  . Plantar fasciitis, bilateral 06/21/2015  . Gross hematuria 10/04/2014  . Urinary frequency 10/04/2014  . Morbid obesity with BMI of 40.0-44.9, adult ([place]) 07/06/2014  . Coronary artery disease of native artery of native heart with stable angina pectoris ([place]) 04/21/2014  . Hyperlipidemia 04/21/2014  . History of ST elevation myocardial infarction (STEMI) 04/06/2014     Past Surgical History:  Procedure Laterality Date  . CARDIAC CATHETERIZATION    . CARDIAC SURGERY    . [place]  . CHOLECYSTECTOMY    . CORONARY ANGIOPLASTY    . CYSTOSCOPY  10/10/2016   Procedure: CYSTOSCOPY;  Surgeon: Will Bonnet, MD;  Location: [facility] ORS;  Service: Gynecology;;  . Consuela Mimes W/ URETERAL STENT PLACEMENT Right 12/20/2017   Procedure: CYSTOSCOPY WITH RETROGRADE PYELOGRAM/URETERAL STENT PLACEMENT;  Surgeon: Billey Co, MD;  Location: [facility] ORS;  Service: Urology;  Laterality: Right;  . CYSTOSCOPY WITH STENT PLACEMENT Left 11/01/2015   Procedure: CYSTOSCOPY WITH STENT PLACEMENT;  Surgeon: Hollice Espy, MD;  Location: [facility] ORS;  Service: Urology;  Laterality: Left;  . CYSTOSCOPY/RETROGRADE/URETEROSCOPY Bilateral 11/01/2015   Procedure: CYSTOSCOPY/RETROGRADE/URETEROSCOPY;  Surgeon: Hollice Espy, MD;  Location: [facility] ORS;  Service: Urology;  Laterality: Bilateral;  . DILATION AND CURETTAGE OF UTERUS  2011  . ENDOMETRIAL ABLATION  2011  . LAPAROSCOPIC HYSTERECTOMY Bilateral 10/10/2016   Procedure: HYSTERECTOMY TOTAL LAPAROSCOPIC  BILATERAL SALPINGECTOMY;  Surgeon: Will Bonnet, MD;  Location: [facility] ORS;  Service: Gynecology;  Laterality: Bilateral;  . LEFT HEART CATHETERIZATION WITH CORONARY ANGIOGRAM N/A 04/06/2014   Procedure: LEFT HEART CATHETERIZATION WITH CORONARY ANGIOGRAM;  Surgeon: Jettie Booze, MD;  Location: [facility] CATH LAB;  Service:  Cardiovascular;  Laterality: N/A;     Prior to Admission medications   Medication Sig Start Date End Date Taking? Authorizing Provider  acetaminophen (TYLENOL) 325 MG tablet Take 2 tablets (650 mg total) by mouth every 6 (six) hours as needed. 02/01/19   Carrie Mew, MD  busPIRone (BUSPAR) 7.5 MG tablet Take 1 tablet (7.5 mg total) by mouth 3 (three) times daily. 11/14/18   Karamalegos, Devonne Doughty, DO  Chromium 200 MCG CAPS Take by mouth. 01/02/17   Karamalegos, Devonne Doughty, DO  clopidogrel (PLAVIX) 75 MG tablet TAKE 1 TABLET BY MOUTH EVERY DAY 01/02/19   Jettie Booze, MD  escitalopram (LEXAPRO) 20 MG tablet TAKE 1 TABLET BY MOUTH EVERY DAY 01/25/19   Karamalegos, Devonne Doughty, DO  furosemide (LASIX) 20 MG tablet Take 1 tablet (20 mg total) by mouth daily. 12/25/18   Jettie Booze, MD  isosorbide mononitrate (IMDUR) 30 MG 24 hr tablet TAKE 1 TABLET (30 MG TOTAL) BY MOUTH DAILY. PLEASE KEEP UPCOMING APPT FOR FUTURE REFILLS. 12/10/18   Jettie Booze, MD  levothyroxine (SYNTHROID) 25 MCG tablet TAKE 1 TABLET (25 MCG TOTAL) BY MOUTH DAILY BEFORE BREAKFAST. 11/26/18   Karamalegos, Devonne Doughty, DO  lidocaine (LIDODERM) 5 % Place 1 patch onto the skin every 12 (twelve) hours. Remove & Discard patch within 12 hours or as directed by MD 02/01/19   Carrie Mew, MD  lisinopril (ZESTRIL) 5 MG tablet Take 1 tablet (5 mg total) by mouth daily. 01/22/19   Jettie Booze, MD  Melatonin 1 MG CAPS Take 10 mg by mouth at bedtime. 01/02/17   Karamalegos, Devonne Doughty, DO  metoprolol tartrate (LOPRESSOR) 25 MG tablet Take 1 tablet (25 mg total) by mouth 2 (two) times daily. 12/03/18   Jettie Booze, MD  naproxen (NAPROSYN) 500 MG tablet Take 1 tablet (500 mg total) by mouth 2 (two) times daily with a meal for 3 days. 02/01/19 02/04/19  Carrie Mew, MD  nitroGLYCERIN (NITROSTAT) 0.4 MG SL tablet Place 1 tablet (0.4 mg total) under the tongue every 5 (five) minutes x 3 doses as  needed for chest pain. 08/01/17   Jettie Booze, MD  ondansetron (ZOFRAN ODT) 4 MG disintegrating tablet Take 1 tablet (4 mg total) by mouth every 8 (eight) hours as needed for nausea or vomiting. 04/29/18   McGowan, Larene Beach A, PA-C  oxyCODONE (ROXICODONE) 5 MG immediate release tablet Take 1 tablet (5 mg total) by mouth every 6 (six) hours as needed for breakthrough pain. 02/01/19   Carrie Mew, MD  rosuvastatin (CRESTOR) 20 MG tablet TAKE 1 TABLET DAILY (PLEASE KEEP UPCOMING APPOINTMENT IN OCTOBER WITH DR Irish Lack FOR FUTURE REFILLS) 01/15/19   Jettie Booze, MD  Semaglutide,0.25 or 0.5MG /DOS, (OZEMPIC, 0.25 OR 0.5 MG/DOSE,) 2 MG/1.5ML SOPN Inject 0.25 mg into the skin once a week. For first 4 weeks. Then increase dose to 0.5mg  weekly 12/10/18   Olin Hauser, DO  tamsulosin (FLOMAX) 0.4 MG CAPS capsule TAKE 1 CAPSULE BY MOUTH EVERY DAY 12/12/18   McGowan, Larene Beach A, PA-C  traZODone (DESYREL) 100 MG tablet Take 1 tablet (100 mg total) by mouth at bedtime. 11/14/18   Olin Hauser, DO  Vitamin D, Cholecalciferol, 1000 units CAPS Take 1,000 Units by mouth daily.     [provider]     Allergies Latex and Penicillins   Family History  Problem Relation Age of Onset  . Heart attack Father 42       died of MI at age 48  . High blood pressure Father   . High Cholesterol Father   . Heart disease Father   . Sudden death Father   . Hypertension Mother   . Hyperlipidemia Mother   . Hypothyroidism Mother   . Kidney Stones Sister   . Kidney Stones Brother   . Stroke Paternal Grandfather   . Liver cancer Maternal Grandmother   . Kidney disease Neg Hx   . Bladder Cancer Neg Hx     Social History Social History   Tobacco Use  . Smoking status: Former Smoker    Packs/day: 1.00    Types: Cigarettes  . Smokeless tobacco: Former Systems developer  . Tobacco comment: quit 20 years  Substance Use Topics  . Alcohol use: No    Alcohol/week: 0.0 standard drinks   . Drug use: No    Review of Systems  Constitutional:   No fever or chills.  ENT:   No sore throat. No rhinorrhea. Cardiovascular:   No chest pain or syncope. Respiratory:   No dyspnea or cough. Gastrointestinal:   Negative for abdominal pain, vomiting and diarrhea.  Musculoskeletal:   Right flank pain as above. All other systems reviewed and are negative except as documented above in ROS and HPI.  ____________________________________________   PHYSICAL EXAM:  VITAL SIGNS: ED Triage Vitals  Enc Vitals Group     BP 02/01/19 1448 123/79     Pulse Rate 02/01/19 1448 100     Resp 02/01/19 1448 16     Temp 02/01/19 1448 98.8 F (37.1 C)     Temp Source 02/01/19 1448 Oral     SpO2 02/01/19 1448 96 %     Weight 02/01/19 1445 262 lb 5.6 oz (119 kg)     Height 02/01/19 1445 5\' 5"  (1.651 m)     Head Circumference --      Peak Flow --      Pain Score 02/01/19 1444 6     Pain Loc --      Pain Edu? --      Excl. in [place]? --     Vital signs reviewed, nursing assessments reviewed.   Constitutional:   Alert and oriented. Non-toxic appearance. Eyes:   Conjunctivae are normal. EOMI. PERRL. ENT      Head:   Normocephalic and atraumatic.      Nose:   Wearing a mask.      Mouth/Throat:   Wearing a mask.      Neck:   No meningismus. Full ROM. Hematological/Lymphatic/Immunilogical:   No cervical lymphadenopathy. Cardiovascular:   RRR. Symmetric bilateral radial and DP pulses.  No murmurs. Cap refill less than 2 seconds. Respiratory:   Normal respiratory effort without tachypnea/retractions. Breath sounds are clear and equal bilaterally. No wheezes/rales/rhonchi. Gastrointestinal:   Soft and nontender. Non distended. There is no CVA tenderness.  No rebound, rigidity, or guarding.  Musculoskeletal:   Normal range of motion in all extremities. No joint effusions.  No lower extremity tenderness.  No edema.  Focal tenderness over the right SI joint reproducing her pain.  No midline spinal  tenderness Neurologic:   Normal speech and language.  Motor grossly intact. No acute focal neurologic  deficits are appreciated.  Skin:    Skin is warm, dry and intact. No rash noted.  No petechiae, purpura, or bullae.  ____________________________________________    LABS (pertinent positives/negatives) (all labs ordered are listed, but only abnormal results are displayed) Labs Reviewed  BASIC METABOLIC PANEL - Abnormal; Notable for the following components:      Result Value   Glucose, Bld 101 (*)    All other components within normal limits  URINALYSIS, COMPLETE (UACMP) WITH MICROSCOPIC - Abnormal; Notable for the following components:   Color, Urine YELLOW (*)    APPearance HAZY (*)    Ketones, ur 5 (*)    Protein, ur 30 (*)    Bacteria, UA RARE (*)    All other components within normal limits  CBC  TROPONIN I (HIGH SENSITIVITY)  TROPONIN I (HIGH SENSITIVITY)   ____________________________________________   EKG    ____________________________________________    RADIOLOGY  DG Chest 2 View  Result Date: 02/01/2019 CLINICAL DATA:  Palpitations EXAM: CHEST - 2 VIEW COMPARISON:  10/26/2017 FINDINGS: The heart size and mediastinal contours are within normal limits. Both lungs are clear. Disc degenerative disease of the thoracic spine. IMPRESSION: No acute abnormality of the lungs. Electronically Signed   By: Eddie Candle M.D.   On: 02/01/2019 15:59    ____________________________________________   PROCEDURES Procedures  ____________________________________________  DIFFERENTIAL DIAGNOSIS   Diverticulitis, musculoskeletal pain, ureterolithiasis, cystitis, radiculopathy.  Doubt dissection, AAA, mesenteric ischemia, appendicitis, cholecystitis, bowel perforation or obstruction, hernia, epidural abscess, central cord syndrome  CLINICAL IMPRESSION / ASSESSMENT AND PLAN / ED COURSE  Medications ordered in the ED: Medications  sodium chloride flush (NS) 0.9 %  injection 3 mL (has no administration in time range)  lidocaine (LIDODERM) 5 % 1 patch (has no administration in time range)  fentaNYL (SUBLIMAZE) injection 50 mcg (50 mcg Intramuscular Given 02/01/19 2114)    Pertinent labs & imaging results that were available during my care of the patient were reviewed by me and considered in my medical decision making (see chart for details).  Sheri [name] was evaluated in Emergency Department on 02/01/2019 for the symptoms described in the history of present illness. She was evaluated in the context of the global COVID-19 pandemic, which necessitated consideration that the patient might be at risk for infection with the SARS-CoV-2 virus that causes COVID-19. Institutional protocols and algorithms that pertain to the evaluation of patients at risk for COVID-19 are in a state of rapid change based on information released by regulatory bodies including the CDC and federal and state organizations. These policies and algorithms were followed during the patient's care in the ED.     Clinical Course as of Feb 01 2308  Sat Feb 01, 2019  2048 Patient presents with right flank pain which seems to localize to the right SI joint with tenderness in the area.  No fever, normal blood counts.  The rest the labs are reassuring as well.  With her history of kidney stones, I will get a CT scan of the abdomen and pelvis to further evaluate for likely stone disease versus diverticulitis, or spinal compression fracture.   [PS]    Clinical Course User Index [PS] Carrie Mew, MD     ----------------------------------------- 11:10 PM on 02/01/2019 -----------------------------------------  Awaiting CT report.  On my review of the scans are not seen a large obstructing stones.  Will follow-up radiology findings.  Given that exam is suggestive of musculoskeletal pain, counseled her on need for anti-inflammatory medicine,  heat therapy.  Already very limited prescription of  oxycodone as well, lidocaine patches.  Care signed out to Dr. Karma Greaser to follow-up on final CT read.  ____________________________________________   FINAL CLINICAL IMPRESSION(S) / ED DIAGNOSES    Final diagnoses:  Pain of right sacroiliac joint     ED Discharge Orders         Ordered    acetaminophen (TYLENOL) 325 MG tablet  Every 6 hours PRN     02/01/19 2259    naproxen (NAPROSYN) 500 MG tablet  2 times daily with meals     02/01/19 2259    lidocaine (LIDODERM) 5 %  Every 12 hours     02/01/19 2259    oxyCODONE (ROXICODONE) 5 MG immediate release tablet  Every 6 hours PRN,   Status:  Discontinued     02/01/19 2307    oxyCODONE (ROXICODONE) 5 MG immediate release tablet  Every 6 hours PRN     02/01/19 2308          Portions of this note were generated with dragon dictation software. Dictation errors may occur despite best attempts at proofreading.   Carrie Mew, MD 02/01/19 2311

## 2019-02-01 NOTE — ED Notes (Signed)
Pt complains of shortness of breath, palpitations since today and back pain for several days. Pt states she has noticed blood in her urine. Pt denies cough, chest pain, fever. Pt appears shob when ambulating. Pt complains of bilateral lower back pain.

## 2019-02-01 NOTE — ED Triage Notes (Signed)
Pt to ED via POV from [facility] for back pain. Pt states that she had UA done that was negative. Pt states that pain started on Wednesday. Pt denies radiation of the pain. Pt denies numbness or weakness in her legs. Pt also states that she has had increased palpitation and some shortness of breath. Pt is currently in NAD.

## 2019-02-01 NOTE — ED Notes (Signed)
Pt updated on delay, pt verbalizes understanding.

## 2019-02-02 NOTE — ED Provider Notes (Signed)
-----------------------------------------   12:05 AM on 02/02/2019 -----------------------------------------  Assumed care from Dr. Joni Fears.  In short, Sheri [name] is a 54 y.o. female with a chief complaint of back pain.  Refer to the original H&P for additional details.  Unremarkable CT scan with no evidence of kidney stones or other emergent abnormality.  Discharging as per Dr. Jerene Canny instructions.   Hinda Kehr, MD 02/02/19 0005

## 2019-02-13 ENCOUNTER — Other Ambulatory Visit: Payer: Self-pay | Admitting: Interventional Cardiology

## 2019-02-13 ENCOUNTER — Other Ambulatory Visit: Payer: Self-pay | Admitting: Family Medicine

## 2019-02-13 DIAGNOSIS — R7303 Prediabetes: Secondary | ICD-10-CM

## 2019-02-13 DIAGNOSIS — F5104 Psychophysiologic insomnia: Secondary | ICD-10-CM

## 2019-02-13 MED ORDER — METOPROLOL TARTRATE 25 MG PO TABS: 25.0000 mg | ORAL_TABLET | Freq: Two times a day (BID) | ORAL | 3 refills | Status: DC

## 2019-02-14 ENCOUNTER — Encounter: Payer: Self-pay | Admitting: Family Medicine

## 2019-02-14 ENCOUNTER — Other Ambulatory Visit: Payer: Self-pay

## 2019-02-14 ENCOUNTER — Ambulatory Visit (INDEPENDENT_AMBULATORY_CARE_PROVIDER_SITE_OTHER): Payer: Self-pay | Admitting: Family Medicine

## 2019-02-14 VITALS — BP 117/80 | HR 83 | Temp 96.9°F | Ht 65.0 in | Wt 257.0 lb

## 2019-02-14 DIAGNOSIS — R7303 Prediabetes: Secondary | ICD-10-CM

## 2019-02-14 DIAGNOSIS — F411 Generalized anxiety disorder: Secondary | ICD-10-CM

## 2019-02-14 DIAGNOSIS — Z6841 Body Mass Index (BMI) 40.0 and over, adult: Secondary | ICD-10-CM

## 2019-02-14 DIAGNOSIS — F3341 Major depressive disorder, recurrent, in partial remission: Secondary | ICD-10-CM

## 2019-02-14 LAB — POCT GLYCOSYLATED HEMOGLOBIN (HGB A1C): Hemoglobin A1C: 5.2 % (ref 4.0–5.6)

## 2019-02-14 MED ORDER — OZEMPIC (1 MG/DOSE) 2 MG/1.5ML ~~LOC~~ SOPN: 1.0000 mg | PEN_INJECTOR | SUBCUTANEOUS | 1 refills | Status: DC

## 2019-02-14 NOTE — Assessment & Plan Note (Signed)
Notable improvement with GLP1, reduced appetite lost 7 lbs in 4 months Previously w/ [facility] and Bariatric Surgery but now unable to afford surgery.  Plan: 1. Counseling on lifestyle modifications again encouraged to continue improve diet, low carb, low sugar, water intake - Goals to improve regular exercise as tolerated - Increase GLP1 Ozempic from 0.5 up to 1mg 

## 2019-02-14 NOTE — Progress Notes (Signed)
Subjective:    Patient ID: Sheri [name], female    DOB: 1965/08/04, 54 y.o.   MRN: [id number]  Sheri [name] is a 54 y.o. female presenting on 02/14/2019 for Weight Check and Back Pain (lower back pain, mostly on the right side. The pt was seen at the ER recently to address the pain.)   HPI   Morbid Obesity BMI >42 / Pre-Diabetes - Last visit with me 10/2018, for same problem obesity and PreDM, treated with start new Ozempic GLP1, see prior notes for background information. - Interval update with titrated up to Ozempic 0.5 weekly now. She stopped metformin when we switched to GLP1 - Today patient reports doing well on new GLP1. - A1c results improved from 5.7 to 5.8 now down to 5.2 on GLP1 - Weight down 7 lbs in 4 months Lifestyle - Diet:Improved low carb diet, limited portions, followed prior wt loss strategies. She eats mostly heart healthy diet proteins, salads, drinks >40 oz water daily - Exercise:Increasing exercise as tolerated  Follow-up R Hip / SI joint pain inflammation Recent Urgent Care > ED visit 02/01/19. Thought kidney stone or other issue. She had CT that was negative, dx with SI joint inflammation, treated with NSAID and had improvement. Now improving.  Anxiety follow-up Recent worsening anxiety life stressors  Was taking Buspar 7.'5mg'$  TID with benefit thinks needs dose adjusted now. Continues on Escitalopram '20mg'$  daily. Continues on Trazodone '100mg'$  nightly, sleep is worse part with anxiety difficulty falling asleep She is starting to paint more to relax at times.   Health Maintenance: Still has cologuard kit at home.  Depression screen [facility] 2/9 02/14/2019 11/14/2018 06/14/2018  Decreased Interest 0 1 1  Down, Depressed, Hopeless '1 1 1  '$ PHQ - 2 Score '1 2 2  '$ Altered sleeping 3 0 3  Tired, decreased energy '3 2 1  '$ Change in appetite 0 2 3  Feeling bad or failure about yourself  0 2 1  Trouble concentrating '3 2 1  '$ Moving slowly or fidgety/restless 3 0 1  Suicidal  thoughts 0 1 0  PHQ-9 Score '13 11 12  '$ Difficult doing work/chores Somewhat difficult Somewhat difficult Somewhat difficult  Some recent data might be hidden   GAD 7 : Generalized Anxiety Score 02/14/2019 11/14/2018 06/14/2018 05/17/2018  Nervous, Anxious, on Edge '3 1 1 3  '$ Control/stop worrying '3 2 1 3  '$ Worry too much - different things '3 2 1 3  '$ Trouble relaxing '3 2 1 3  '$ Restless 3 0 0 0  Easily annoyed or irritable 0 '1 1 3  '$ Afraid - awful might happen '3 1 1 3  '$ Total GAD 7 Score '18 9 6 18  '$ Anxiety Difficulty Extremely difficult Somewhat difficult Somewhat difficult Somewhat difficult      Social History   Tobacco Use  . Smoking status: Former Smoker    Packs/day: 1.00    Types: Cigarettes  . Smokeless tobacco: Former Systems developer  . Tobacco comment: quit 20 years  Substance Use Topics  . Alcohol use: No    Alcohol/week: 0.0 standard drinks  . Drug use: No    Review of Systems Per HPI unless specifically indicated above     Objective:    BP 117/80 (BP Location: Left Arm, Patient Position: Sitting, Cuff Size: Large)   Pulse 83   Temp (!) 96.9 F (36.1 C) (Oral)   Ht '5\' 5"'$  (1.651 m)   Wt 257 lb (116.6 kg)   LMP 09/13/2016   BMI 42.77  kg/m   Wt Readings from Last 3 Encounters:  02/14/19 257 lb (116.6 kg)  02/01/19 262 lb 5.6 oz (119 kg)  11/18/18 264 lb (119.7 kg)    Physical Exam Vitals and nursing note reviewed.  Constitutional:      General: She is not in acute distress.    Appearance: She is well-developed. She is not diaphoretic.     Comments: Well-appearing, comfortable, cooperative  HENT:     Head: Normocephalic and atraumatic.  Eyes:     General:        Right eye: No discharge.        Left eye: No discharge.     Conjunctiva/sclera: Conjunctivae normal.  Cardiovascular:     Rate and Rhythm: Normal rate.  Pulmonary:     Effort: Pulmonary effort is normal.  Skin:    General: Skin is warm and dry.     Findings: No erythema or rash.  Neurological:      Mental Status: She is alert and oriented to person, place, and time.  Psychiatric:        Behavior: Behavior normal.     Comments: Well groomed, good eye contact, normal speech and thoughts      Recent Labs    11/05/18 0826 02/14/19 0823  HGBA1C 5.7* 5.2     I have personally reviewed the radiology report from 02/01/19 CT Renal Stone Study.  CT Renal Stone StudyPerformed 02/01/2019 Final result  Study Result CLINICAL DATA: Flank pain with kidney stones suspected.  EXAM: CT ABDOMEN AND PELVIS WITHOUT CONTRAST  TECHNIQUE: Multidetector CT imaging of the abdomen and pelvis was performed following the standard protocol without IV contrast.  COMPARISON: CT dated December 20, 2017.  FINDINGS: Lower chest: The lung bases are clear. The heart size is normal.  Hepatobiliary: There is borderline hepatic steatosis. Status post cholecystectomy.There is no biliary ductal dilation.  Pancreas: Normal contours without ductal dilatation. No peripancreatic fluid collection.  Spleen: No splenic laceration or hematoma.  Adrenals/Urinary Tract:  --Adrenal glands: No adrenal hemorrhage.  --Right kidney/ureter: No hydronephrosis or perinephric hematoma.  --Left kidney/ureter: There is a 13 mm stone in the lower pole the left kidney. There is no left-sided hydronephrosis. There are no ureteral stones.  --Urinary bladder: Unremarkable.  Stomach/Bowel:  --Stomach/Duodenum: No hiatal hernia or other gastric abnormality. Normal duodenal course and caliber.  --Small bowel: No dilatation or inflammation.  --Colon: No focal abnormality.  --Appendix: Normal.  Vascular/Lymphatic: Atherosclerotic calcification is present within the non-aneurysmal abdominal aorta, without hemodynamically significant stenosis.  --No retroperitoneal lymphadenopathy.  --No mesenteric lymphadenopathy.  --No pelvic or inguinal lymphadenopathy.  Reproductive: Status post hysterectomy. No adnexal  mass.  Other: No ascites or free air. The abdominal wall is normal.  Musculoskeletal. No acute displaced fractures.  IMPRESSION: 1. No acute abdominopelvic abnormality. 2. Nonobstructive left nephrolithiasis. 3. Borderline hepatic steatosis.  Aortic Atherosclerosis (ICD10-I70.0).   Electronically Signed By: Constance Holster M.D. On: 02/01/2019 22:22   Results for orders placed or performed in visit on 02/14/19  POCT HgB A1C  Result Value Ref Range   Hemoglobin A1C 5.2 4.0 - 5.6 %   Recent Labs    11/05/18 0826 02/14/19 0823  HGBA1C 5.7* 5.2       Assessment & Plan:   Problem List Items Addressed This Visit    Recurrent major depression in partial remission ([place])    See A&P for GAD, insomnia      Relevant Medications   busPIRone (BUSPAR) 7.5 MG tablet  Prediabetes - Primary    Well-controlled Pre-DM with A1c now down to 5.2, excellent improvement on GLP1 Concern with morbid obesity, HTN, HLD  Plan:  1. Increase Ozempic GLP1 up to '1mg'$  - new rx sent - indicated due to CAD, Morbid Obesity, PreDM 2. Encourage improved lifestyle - low carb, low sugar diet, reduce portion size, continue improving regular exercise 3. Follow-up 3 months PreDM A1c      Relevant Medications   Semaglutide, 1 MG/DOSE, (OZEMPIC, 1 MG/DOSE,) 2 MG/1.5ML SOPN   Other Relevant Orders   POCT HgB A1C (Completed)   Morbid obesity with BMI of 40.0-44.9, adult (HCC)    Notable improvement with GLP1, reduced appetite lost 7 lbs in 4 months Previously w/ [facility] and Bariatric Surgery but now unable to afford surgery.  Plan: 1. Counseling on lifestyle modifications again encouraged to continue improve diet, low carb, low sugar, water intake - Goals to improve regular exercise as tolerated - Increase GLP1 Ozempic from 0.5 up to '1mg'$       Relevant Medications   Semaglutide, 1 MG/DOSE, (OZEMPIC, 1 MG/DOSE,) 2 MG/1.5ML SOPN   GAD (generalized anxiety disorder)    Persistent issue  now with some flare of anxiety  Increase Buspar from 7.5 TID up to one and half pill around '10mg'$  TID - start with PM dose then add to other doses as needed. Request new rx for '10mg'$  TID when ready, already has plenty of 7.'5mg'$  tab at home to use one and half  - Continue Escitalopram, Trazodone      Relevant Medications   busPIRone (BUSPAR) 7.5 MG tablet      Meds ordered this encounter  Medications  . Semaglutide, 1 MG/DOSE, (OZEMPIC, 1 MG/DOSE,) 2 MG/1.5ML SOPN    Sig: Inject 1 mg into the skin once a week.    Dispense:  3 pen    Refill:  1    Dose increase from 0.5 to 1      Follow up plan: Return in about 3 months (around 05/15/2019) for 3 months PreDM A1c, Weight ANxiety.    [name], [facility] 02/14/2019, 8:20 AM

## 2019-02-14 NOTE — Assessment & Plan Note (Signed)
Persistent issue now with some flare of anxiety  Increase Buspar from 7.5 TID up to one and half pill around 10mg  TID - start with PM dose then add to other doses as needed. Request new rx for 10mg  TID when ready, already has plenty of 7.5mg  tab at home to use one and half  - Continue Escitalopram, Trazodone

## 2019-02-14 NOTE — Patient Instructions (Addendum)
Thank you for coming to the office today.  Recommend [facility] for mammogram.  Complete Cologuard kit  Increase Buspar from 7.5 3 times daily up to ONE AND HALF pills start in evening and can add others. Let me know when ready for new rx and what dose  Increase Ozempic when ready, new rx sent for '1mg'$  weekly.   Please schedule a Follow-up Appointment to: Return in about 3 months (around 05/15/2019) for 3 months PreDM A1c, Weight ANxiety.  If you have any other questions or concerns, please feel free to call the office or send a message through [place]. You may also schedule an earlier appointment if necessary.  Additionally, you may be receiving a survey about your experience at our office within a few days to 1 week by e-mail or mail. We value your feedback.  [name], DO [place]

## 2019-02-14 NOTE — Assessment & Plan Note (Signed)
See A&P for GAD, insomnia

## 2019-02-14 NOTE — Assessment & Plan Note (Signed)
Well-controlled Pre-DM with A1c now down to 5.2, excellent improvement on GLP1 Concern with morbid obesity, HTN, HLD  Plan:  1. Increase Ozempic GLP1 up to 1mg  - new rx sent - indicated due to CAD, Morbid Obesity, PreDM 2. Encourage improved lifestyle - low carb, low sugar diet, reduce portion size, continue improving regular exercise 3. Follow-up 3 months PreDM A1c

## 2019-03-12 DIAGNOSIS — U071 COVID-19: Secondary | ICD-10-CM | POA: Diagnosis not present

## 2019-03-15 ENCOUNTER — Other Ambulatory Visit: Payer: Self-pay | Admitting: Urology

## 2019-03-15 DIAGNOSIS — N200 Calculus of kidney: Secondary | ICD-10-CM

## 2019-03-17 ENCOUNTER — Telehealth: Payer: Self-pay | Admitting: Nurse Practitioner

## 2019-03-17 ENCOUNTER — Other Ambulatory Visit: Payer: Self-pay | Admitting: Physician Assistant

## 2019-03-17 ENCOUNTER — Ambulatory Visit (HOSPITAL_COMMUNITY)
Admission: RE | Admit: 2019-03-17 | Discharge: 2019-03-17 | Disposition: A | Discharge status: Home / Self Care | Payer: BC Managed Care – PPO | Source: Ambulatory Visit | Attending: Pulmonary Disease | Admitting: Pulmonary Disease

## 2019-03-17 ENCOUNTER — Other Ambulatory Visit: Payer: Self-pay | Admitting: Family Medicine

## 2019-03-17 ENCOUNTER — Encounter: Payer: Self-pay | Admitting: Physician Assistant

## 2019-03-17 DIAGNOSIS — Z6841 Body Mass Index (BMI) 40.0 and over, adult: Secondary | ICD-10-CM | POA: Insufficient documentation

## 2019-03-17 DIAGNOSIS — I25118 Atherosclerotic heart disease of native coronary artery with other forms of angina pectoris: Secondary | ICD-10-CM | POA: Insufficient documentation

## 2019-03-17 DIAGNOSIS — U071 COVID-19: Secondary | ICD-10-CM | POA: Diagnosis not present

## 2019-03-17 DIAGNOSIS — I10 Essential (primary) hypertension: Secondary | ICD-10-CM

## 2019-03-17 MED ORDER — SODIUM CHLORIDE 0.9 % IV SOLN
INTRAVENOUS | Status: DC | PRN
  Administered 2019-03-17: 250 mL via INTRAVENOUS

## 2019-03-17 MED ORDER — EPINEPHRINE 0.3 MG/0.3ML IJ SOAJ: 0.3000 mg | Freq: Once | INTRAMUSCULAR | Status: DC | PRN

## 2019-03-17 MED ORDER — FAMOTIDINE IN NACL 20-0.9 MG/50ML-% IV SOLN: 20.0000 mg | Freq: Once | INTRAVENOUS | Status: DC | PRN

## 2019-03-17 MED ORDER — DIPHENHYDRAMINE HCL 50 MG/ML IJ SOLN: 50.0000 mg | Freq: Once | INTRAMUSCULAR | Status: DC | PRN

## 2019-03-17 MED ORDER — METHYLPREDNISOLONE SODIUM SUCC 125 MG IJ SOLR: 125.0000 mg | Freq: Once | INTRAMUSCULAR | Status: DC | PRN

## 2019-03-17 MED ORDER — ALBUTEROL SULFATE HFA 108 (90 BASE) MCG/ACT IN AERS: 2.0000 | INHALATION_SPRAY | Freq: Once | RESPIRATORY_TRACT | Status: DC | PRN

## 2019-03-17 MED ORDER — SODIUM CHLORIDE 0.9 % IV SOLN
700.0000 mg | Freq: Once | INTRAVENOUS | Status: AC
  Administered 2019-03-17: 700 mg via INTRAVENOUS
  Filled 2019-03-17: qty 20

## 2019-03-17 NOTE — Telephone Encounter (Signed)
Called to Discuss with patient about Covid symptoms and the use of bamlanivimab, a monoclonal antibody infusion for those with mild to moderate Covid symptoms and at a high risk of hospitalization.     Pt is qualified for this infusion at the [facility] infusion center due to co-morbid conditions and/or a member of an at-risk group.     Unable to reach pt  

## 2019-03-17 NOTE — Progress Notes (Signed)
  Diagnosis: COVID-19  Physician: Dr. Wright  Procedure: Covid Infusion Clinic Med: bamlanivimab infusion - Provided patient with bamlanimivab fact sheet for patients, parents and caregivers prior to infusion.  Complications: No immediate complications noted.  Discharge: Discharged home   Sheri [name] 03/17/2019  

## 2019-03-17 NOTE — Progress Notes (Signed)
  I connected by phone with Sheri [name] on 03/17/2019 at 2:15 PM to discuss the potential use of an new treatment for mild to moderate COVID-19 viral infection in non-hospitalized patients.  This patient is a 54 y.o. female that meets the FDA criteria for Emergency Use Authorization of bamlanivimab or casirivimab\imdevimab.  Has a (+) direct SARS-CoV-2 viral test result  Has mild or moderate COVID-19   Is ? 55 years of age and weighs ? 40 kg  Is NOT hospitalized due to COVID-19  Is NOT requiring oxygen therapy or requiring an increase in baseline oxygen flow rate due to COVID-19  Is within 10 days of symptom onset  Has at least one of the high risk factor(s) for progression to severe COVID-19 and/or hospitalization as defined in EUA.  Specific high risk criteria : BMI >/= 35, CAD, HTN   I have spoken and communicated the following to the patient or parent/caregiver:  1. FDA has authorized the emergency use of bamlanivimab and casirivimab\imdevimab for the treatment of mild to moderate COVID-19 in adults and pediatric patients with positive results of direct SARS-CoV-2 viral testing who are 66 years of age and older weighing at least 40 kg, and who are at high risk for progressing to severe COVID-19 and/or hospitalization.  2. The significant known and potential risks and benefits of bamlanivimab and casirivimab\imdevimab, and the extent to which such potential risks and benefits are unknown.  3. Information on available alternative treatments and the risks and benefits of those alternatives, including clinical trials.  4. Patients treated with bamlanivimab and casirivimab\imdevimab should continue to self-isolate and use infection control measures (e.g., wear mask, isolate, social distance, avoid sharing personal items, clean and disinfect "high touch" surfaces, and frequent handwashing) according to CDC guidelines.   5. The patient or parent/caregiver has the option to accept or  refuse bamlanivimab or casirivimab\imdevimab .  After reviewing this information with the patient, The patient agreed to proceed with receiving the bamlanimivab infusion and will be provided a copy of the Fact sheet prior to receiving the infusion.   Sx onset 03/10/19. Set up for today at 4:30 pm for infusion. Directions given   Angelena Form PA-C 03/17/2019 2:15 PM

## 2019-03-17 NOTE — Discharge Instructions (Signed)

## 2019-03-19 ENCOUNTER — Encounter: Payer: Self-pay | Admitting: Family Medicine

## 2019-03-19 ENCOUNTER — Ambulatory Visit (INDEPENDENT_AMBULATORY_CARE_PROVIDER_SITE_OTHER): Payer: HRSA Program | Admitting: Family Medicine

## 2019-03-19 ENCOUNTER — Other Ambulatory Visit: Payer: Self-pay

## 2019-03-19 DIAGNOSIS — U071 COVID-19: Secondary | ICD-10-CM | POA: Diagnosis not present

## 2019-03-19 DIAGNOSIS — J9801 Acute bronchospasm: Secondary | ICD-10-CM | POA: Diagnosis not present

## 2019-03-19 MED ORDER — PREDNISONE 20 MG PO TABS: ORAL_TABLET | ORAL | 0 refills | Status: DC

## 2019-03-19 MED ORDER — ALBUTEROL SULFATE HFA 108 (90 BASE) MCG/ACT IN AERS: 2.0000 | INHALATION_SPRAY | RESPIRATORY_TRACT | 0 refills | Status: DC | PRN

## 2019-03-19 NOTE — Progress Notes (Signed)
Virtual Visit via Telephone The purpose of this virtual visit is to provide medical care while limiting exposure to the novel coronavirus (COVID19) for both patient and office staff.  Consent was obtained for phone visit:  Yes.   Answered questions that patient had about telehealth interaction:  Yes.   I discussed the limitations, risks, security and privacy concerns of performing an evaluation and management service by telephone. I also discussed with the patient that there may be a patient responsible charge related to this service. The patient expressed understanding and agreed to proceed.  Patient Location: Home Provider Location: [facility])   ---------------------------------------------------------------------- Chief Complaint  Patient presents with  . Cough    day 11 after covid positive since her cough is not improving and getting worst and HA    S: Reviewed CMA documentation. I have called patient and gathered additional HPI as follows:  COVID19 Infection / Bronchospasm / Cough Reports recent course COVID19 positive test on 03/12/19, with symptoms onset 2 days prior, she had chills initially with cough but no fever, she has had worsening cough - Tried OTC NyQuil, Mucinex, Coricidin. She tried rx Tessalon perls. - 03/17/19 plasma antibody infusion some improvement temporarily - No known asthma or COPD.  Denies any high risk travel to areas of current concern for COVID19. Denies any known or suspected exposure to person with or possibly with COVID19.  Admits nausea without vomiting, short of breath w/ cough, headache Denies any fevers, chills, sweats, body ache, shortness of breath, sinus pain or pressure, abdominal pain, diarrhea  -------------------------------------------------------------------------- O: No physical exam performed due to remote telephone encounter.  -------------------------------------------------------------------------- A&P:    Suspected COVID19 infection Worsening cough No obvious sign of secondary infection, afebrile  1. Start prednisone taper over 7 days, review benefit risk 2. Start Albuterol inhaler PRN cough 3. Continue OTC cold/flu medicines as needed  Advised close follow up if not improving can schedule at [facility] for face to face eval this Friday 2/19 if needed, consider Chest X-ray if not improving.  Meds ordered this encounter  Medications  . predniSONE (DELTASONE) 20 MG tablet    Sig: Take daily with food. Start with 60mg  (3 pills) x 2 days, then reduce to 40mg  (2 pills) x 2 days, then 20mg  (1 pill) x 3 days    Dispense:  13 tablet    Refill:  0  . albuterol (VENTOLIN HFA) 108 (90 Base) MCG/ACT inhaler    Sig: Inhale 2 puffs into the lungs every 4 (four) hours as needed for wheezing or shortness of breath (cough).    Dispense:  8 g    Refill:  0    REQUIRED self quarantine to [place] - advised to avoid all exposure with others while during treatment. Should continue to quarantine for up to 7-14 days - pending resolution of symptoms, if TEST IS NEGATIVE and symptoms resolve by 7 days and is afebrile >3 days - may STOP self quarantine at that time. IF test is POSITIVE then will require 10 additional day quarantine after date of positive test result.   If symptoms do not resolve or significantly improve OR if WORSENING - fever / cough - or worsening shortness of breath - then should contact us and seek advice on next steps in treatment at home vs where/when to seek care at Urgent Care or Hospital ED for further intervention and possible testing if indicated.  Patient verbalizes understanding with the above medical recommendations including  the limitation of remote medical advice.  Specific follow-up / call-back criteria were given for patient to follow-up or seek medical care more urgently if needed.   - Time spent in direct consultation with patient on phone:  8 minutes   [name], [facility] 03/19/2019, 8:45 AM

## 2019-03-19 NOTE — Patient Instructions (Addendum)
Start prednisone taper over 7 days Start Albuterol inhaler 2 puffs every 4-6 hours as needed for cough shortness of breath  If significant worsening short of breath can follow-up sooner if not improving or seek care at hospital ED / urgent care  Call us if you want to be seen face to face for Respiratory Clinic here at [facility] on Friday 2/19 530 to 715pm  Please schedule a Follow-up Appointment to: Return in about 1 week (around 03/26/2019), or if symptoms worsen or fail to improve, for COVID.  If you have any other questions or concerns, please feel free to call the office or send a message through [place]. You may also schedule an earlier appointment if necessary.  Additionally, you may be receiving a survey about your experience at our office within a few days to 1 week by e-mail or mail. We value your feedback.  [name], DO [place]

## 2019-03-20 DIAGNOSIS — Z20828 Contact with and (suspected) exposure to other viral communicable diseases: Secondary | ICD-10-CM | POA: Diagnosis not present

## 2019-03-20 DIAGNOSIS — Z03818 Encounter for observation for suspected exposure to other biological agents ruled out: Secondary | ICD-10-CM | POA: Diagnosis not present

## 2019-04-07 ENCOUNTER — Other Ambulatory Visit: Payer: Self-pay | Admitting: Family Medicine

## 2019-04-07 DIAGNOSIS — R7303 Prediabetes: Secondary | ICD-10-CM

## 2019-04-11 ENCOUNTER — Other Ambulatory Visit: Payer: Self-pay | Admitting: Family Medicine

## 2019-04-11 DIAGNOSIS — J9801 Acute bronchospasm: Secondary | ICD-10-CM

## 2019-04-11 DIAGNOSIS — I25118 Atherosclerotic heart disease of native coronary artery with other forms of angina pectoris: Secondary | ICD-10-CM

## 2019-04-11 DIAGNOSIS — U071 COVID-19: Secondary | ICD-10-CM

## 2019-04-11 DIAGNOSIS — R7303 Prediabetes: Secondary | ICD-10-CM

## 2019-04-11 MED ORDER — OZEMPIC (1 MG/DOSE) 2 MG/1.5ML ~~LOC~~ SOPN: 1.0000 mg | PEN_INJECTOR | SUBCUTANEOUS | 3 refills | Status: DC

## 2019-05-06 ENCOUNTER — Other Ambulatory Visit: Payer: Self-pay

## 2019-05-12 ENCOUNTER — Ambulatory Visit: Payer: Self-pay | Admitting: Family Medicine

## 2019-05-12 ENCOUNTER — Other Ambulatory Visit: Payer: Self-pay

## 2019-05-12 ENCOUNTER — Encounter: Payer: Self-pay | Admitting: Family Medicine

## 2019-05-12 VITALS — BP 114/69 | HR 74 | Temp 97.1°F | Resp 16 | Ht 65.0 in | Wt 250.6 lb

## 2019-05-12 DIAGNOSIS — R1114 Bilious vomiting: Secondary | ICD-10-CM

## 2019-05-12 DIAGNOSIS — R1011 Right upper quadrant pain: Secondary | ICD-10-CM

## 2019-05-12 MED ORDER — ONDANSETRON 4 MG PO TBDP: 4.0000 mg | ORAL_TABLET | Freq: Three times a day (TID) | ORAL | 1 refills | Status: DC | PRN

## 2019-05-12 NOTE — Patient Instructions (Addendum)
Thank you for coming to the office today.  Unsure exact cause.  Take Zofran as needed. Bowel rest if able. Plenty of fluids  [facility]) [place] [address], [place] [address] Phone: [phone]  Please schedule a Follow-up Appointment to: Return if symptoms worsen or fail to improve, for RUQ Abdominal.  If you have any other questions or concerns, please feel free to call the office or send a message through [place]. You may also schedule an earlier appointment if necessary.  Additionally, you may be receiving a survey about your experience at our office within a few days to 1 week by e-mail or mail. We value your feedback.  [name], DO [place]

## 2019-05-12 NOTE — Progress Notes (Signed)
Subjective:    Patient ID: Sheri [name], female    DOB: [birth year], 54 y.o.   MRN: [id number]  Sheri [name] Risk is a 54 y.o. female presenting on 05/12/2019 for Pancreatitis (RUQ pain and soreness)  Patient presents for a same day appointment.  HPI   Chronic RUQ abdominal pain, recurrent episodes Last visit with me in 2018 for same RUQ abdominal pain, at that time had lab work up showed mild elevated lipase, was on dicyclomine and zofran PRN, she declined to pursue hospital ED evaluation at that time. - She had prior history of nephrolithiasis in past, had prior CT Rental Stone study 01/2019, L non obstructive stone. No R sided nephrolithiasis  Today she presents for acute visit for same problem. She discusses this problem as it has been present for many years, seems to describe these painful episodes, RUQ distinctly localized pain with bloating, nausea, and some bilious yellowish vomiting. - She has not seen GI specialist for this problem. - S/p cholecystectomy 15+ years ago by her recall, uncertain exact time - She usually rests and avoids eating much during these flare ups and will stay hydrated and symptoms usually resolve - Currently has reduced appetite - Fam history liver cancer Prior CT, had normal imaging liver pancreas - Out of her Zofran would like refill Denies any fever chills sweats chest pain dyspnea, dysuria, flank pain, diarrhea constipation dark stools  Past Surgical History:  Procedure Laterality Date  . CARDIAC CATHETERIZATION    . CARDIAC SURGERY    . [place]  . CHOLECYSTECTOMY  2006  . CORONARY ANGIOPLASTY    . CYSTOSCOPY  10/10/2016   Procedure: CYSTOSCOPY;  Surgeon: Will Bonnet, MD;  Location: [facility] ORS;  Service: Gynecology;;  . Consuela Mimes W/ URETERAL STENT PLACEMENT Right 12/20/2017   Procedure: CYSTOSCOPY WITH RETROGRADE PYELOGRAM/URETERAL STENT PLACEMENT;  Surgeon: Billey Co, MD;  Location: [facility];  Service:  Urology;  Laterality: Right;  . CYSTOSCOPY WITH STENT PLACEMENT Left 11/01/2015   Procedure: CYSTOSCOPY WITH STENT PLACEMENT;  Surgeon: Hollice Espy, MD;  Location: [facility];  Service: Urology;  Laterality: Left;  . CYSTOSCOPY/RETROGRADE/URETEROSCOPY Bilateral 11/01/2015   Procedure: CYSTOSCOPY/RETROGRADE/URETEROSCOPY;  Surgeon: Hollice Espy, MD;  Location: [facility];  Service: Urology;  Laterality: Bilateral;  . DILATION AND CURETTAGE OF UTERUS  2011  . ENDOMETRIAL ABLATION  2011  . LAPAROSCOPIC HYSTERECTOMY Bilateral 10/10/2016   Procedure: HYSTERECTOMY TOTAL LAPAROSCOPIC BILATERAL SALPINGECTOMY;  Surgeon: Will Bonnet, MD;  Location: [facility];  Service: Gynecology;  Laterality: Bilateral;  . LEFT HEART CATHETERIZATION WITH CORONARY ANGIOGRAM N/A 04/06/2014   Procedure: LEFT HEART CATHETERIZATION WITH CORONARY ANGIOGRAM;  Surgeon: Jettie Booze, MD;  Location: [facility] CATH LAB;  Service: Cardiovascular;  Laterality: N/A;     Depression screen [facility] 2/9 03/19/2019 02/14/2019 11/14/2018  Decreased Interest 0 0 1  Down, Depressed, Hopeless 0 1 1  PHQ - 2 Score 0 1 2  Altered sleeping 0 3 0  Tired, decreased energy 0 3 2  Change in appetite 0 0 2  Feeling bad or failure about yourself  0 0 2  Trouble concentrating 0 3 2  Moving slowly or fidgety/restless 0 3 0  Suicidal thoughts 0 0 1  PHQ-9 Score 0 13 11  Difficult doing work/chores Not difficult at all Somewhat difficult Somewhat difficult  Some recent data might be hidden    Social History   Tobacco Use  . Smoking status: Former Smoker  Packs/day: 1.00    Types: Cigarettes  . Smokeless tobacco: Former Systems developer  . Tobacco comment: quit 20 years  Substance Use Topics  . Alcohol use: No    Alcohol/week: 0.0 standard drinks  . Drug use: No    Review of Systems Per HPI unless specifically indicated above     Objective:    BP 114/69   Pulse 74   Temp (!) 97.1 F (36.2 C) (Temporal)   Resp 16   Ht 5\' 5"  (1.651 m)   Wt  250 lb 9.6 oz (113.7 kg)   LMP 09/13/2016   BMI 41.70 kg/m   Wt Readings from Last 3 Encounters:  05/12/19 250 lb 9.6 oz (113.7 kg)  02/14/19 257 lb (116.6 kg)  02/01/19 262 lb 5.6 oz (119 kg)    Physical Exam Vitals and nursing note reviewed.  Constitutional:      General: She is not in acute distress.    Appearance: She is well-developed. She is obese. She is not diaphoretic.  HENT:     Head: Normocephalic and atraumatic.  Eyes:     Conjunctiva/sclera: Conjunctivae normal.     Pupils: Pupils are equal, round, and reactive to light.  Neck:     Thyroid: No thyromegaly.  Cardiovascular:     Rate and Rhythm: Normal rate and regular rhythm.     Heart sounds: Normal heart sounds. No murmur.  Pulmonary:     Effort: Pulmonary effort is normal. No respiratory distress.     Breath sounds: Normal breath sounds. No wheezing or rales.  Abdominal:     General: Bowel sounds are normal. There is no distension.     Palpations: Abdomen is soft. There is no mass.     Tenderness: There is abdominal tenderness (RUQ). There is no guarding or rebound.     Hernia: No hernia is present.     Comments: Negative Murphy's sign, able to breath with palpation of RUQ without worse pain.  Musculoskeletal:        General: No tenderness. Normal range of motion.     Cervical back: Normal range of motion and neck supple.  Lymphadenopathy:     Cervical: No cervical adenopathy.  Skin:    General: Skin is warm and dry.     Findings: No rash.  Neurological:     Mental Status: She is alert and oriented to person, place, and time.  Psychiatric:        Behavior: Behavior normal.      CLINICAL DATA:  Flank pain with kidney stones suspected.  EXAM: CT ABDOMEN AND PELVIS WITHOUT CONTRAST  TECHNIQUE: Multidetector CT imaging of the abdomen and pelvis was performed following the standard protocol without IV contrast.  COMPARISON:  CT dated December 20, 2017.  FINDINGS: Lower chest: The lung bases  are clear. The heart size is normal.  Hepatobiliary: There is borderline hepatic steatosis. Status post cholecystectomy.There is no biliary ductal dilation.  Pancreas: Normal contours without ductal dilatation. No peripancreatic fluid collection.  Spleen: No splenic laceration or hematoma.  Adrenals/Urinary Tract:  --Adrenal glands: No adrenal hemorrhage.  --Right kidney/ureter: No hydronephrosis or perinephric hematoma.  --Left kidney/ureter: There is a 13 mm stone in the lower pole the left kidney. There is no left-sided hydronephrosis. There are no ureteral stones.  --Urinary bladder: Unremarkable.  Stomach/Bowel:  --Stomach/Duodenum: No hiatal hernia or other gastric abnormality. Normal duodenal course and caliber.  --Small bowel: No dilatation or inflammation.  --Colon: No focal abnormality.  --Appendix: Normal.  Vascular/Lymphatic: Atherosclerotic calcification is present within the non-aneurysmal abdominal aorta, without hemodynamically significant stenosis.  --No retroperitoneal lymphadenopathy.  --No mesenteric lymphadenopathy.  --No pelvic or inguinal lymphadenopathy.  Reproductive: Status post hysterectomy. No adnexal mass.  Other: No ascites or free air. The abdominal wall is normal.  Musculoskeletal. No acute displaced fractures.  IMPRESSION: 1. No acute abdominopelvic abnormality. 2. Nonobstructive left nephrolithiasis. 3. Borderline hepatic steatosis.  Aortic Atherosclerosis (ICD10-I70.0).   Electronically Signed   By: Constance Holster M.D.   On: 02/01/2019 22:22  Results for orders placed or performed in visit on 02/14/19  POCT HgB A1C  Result Value Ref Range   Hemoglobin A1C 5.2 4.0 - 5.6 %      Assessment & Plan:   Problem List Items Addressed This Visit    None    Visit Diagnoses    RUQ abdominal pain    -  Primary   Relevant Orders   Ambulatory referral to Gastroenterology   Bilious vomiting with  nausea       Relevant Medications   ondansetron (ZOFRAN ODT) 4 MG disintegrating tablet      Acute on chronic RUQ abdominal pain episode with nausea vomiting Seems to be colicky in nature triggered by some foods but not always predictable, has been bothering her for 3+ years or more. S/p cholecystectomy >15 years ago Prior work up unremarkable including CT imaging, some labs in past mild elevated lipase, however last time was negative 11/2017  Current history not suggestive of UTI or Nephrolithiasis No acute abdomen on exam No sign of infectious symptoms Tolerating PO  Additionally - she was on Ozempic (not as diabetic but as pre-diabetic with obesity for weight management and sugar control), she did well and lost some weight, seemed her symptoms were actually BETTER on ozempic, off med seems some symptoms flared up. It was discontinued since no longer approved, not a diabetic. I am aware that acute pancreatitis could be worsened on GLP1 medications.  Plan Discussion on uncertain current diagnosis at this time. She is tolerating PO diet and hydration, therefore will plan on outpatient work up Rx Zofran ODT PRN nausea Referral to [facility] for consultation on further diagnostic/management  Orders Placed This Encounter  Procedures  . Ambulatory referral to Gastroenterology    Referral Priority:   Routine    Referral Type:   Consultation    Referral Reason:   Specialty Services Required    Number of Visits Requested:   1     Meds ordered this encounter  Medications  . ondansetron (ZOFRAN ODT) 4 MG disintegrating tablet    Sig: Take 1 tablet (4 mg total) by mouth every 8 (eight) hours as needed for nausea or vomiting.    Dispense:  30 tablet    Refill:  1     Follow up plan: Return if symptoms worsen or fail to improve, for RUQ Abdominal.   [name], DO [facility] 05/12/2019, 2:54 PM

## 2019-05-27 ENCOUNTER — Other Ambulatory Visit: Payer: Self-pay

## 2019-05-27 DIAGNOSIS — F329 Major depressive disorder, single episode, unspecified: Secondary | ICD-10-CM

## 2019-05-27 DIAGNOSIS — F419 Anxiety disorder, unspecified: Secondary | ICD-10-CM

## 2019-05-27 DIAGNOSIS — F32A Depression, unspecified: Secondary | ICD-10-CM

## 2019-06-04 ENCOUNTER — Telehealth (INDEPENDENT_AMBULATORY_CARE_PROVIDER_SITE_OTHER): Payer: BC Managed Care – PPO | Admitting: Family Medicine

## 2019-06-04 ENCOUNTER — Encounter: Payer: Self-pay | Admitting: Family Medicine

## 2019-06-04 ENCOUNTER — Other Ambulatory Visit: Payer: Self-pay

## 2019-06-04 DIAGNOSIS — F900 Attention-deficit hyperactivity disorder, predominantly inattentive type: Secondary | ICD-10-CM

## 2019-06-04 DIAGNOSIS — F988 Other specified behavioral and emotional disorders with onset usually occurring in childhood and adolescence: Secondary | ICD-10-CM | POA: Insufficient documentation

## 2019-06-04 MED ORDER — AMPHETAMINE-DEXTROAMPHETAMINE 10 MG PO TABS: ORAL_TABLET | ORAL | 0 refills | Status: DC
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
## 2019-06-19 ENCOUNTER — Other Ambulatory Visit: Payer: Self-pay

## 2019-06-19 ENCOUNTER — Encounter: Payer: Self-pay | Admitting: Gastroenterology

## 2019-06-19 ENCOUNTER — Ambulatory Visit: Payer: BC Managed Care – PPO | Admitting: Gastroenterology

## 2019-06-19 VITALS — BP 112/78 | HR 79 | Temp 97.5°F | Ht 65.0 in | Wt 253.2 lb

## 2019-06-19 DIAGNOSIS — R1084 Generalized abdominal pain: Secondary | ICD-10-CM

## 2019-06-19 DIAGNOSIS — R748 Abnormal levels of other serum enzymes: Secondary | ICD-10-CM

## 2019-06-19 DIAGNOSIS — Z86010 Personal history of colonic polyps: Secondary | ICD-10-CM

## 2019-06-20 LAB — COMPREHENSIVE METABOLIC PANEL
ALT: 49 IU/L — ABNORMAL HIGH (ref 0–32)
AST: 37 IU/L (ref 0–40)
Albumin/Globulin Ratio: 1.8 (ref 1.2–2.2)
Albumin: 4.4 g/dL (ref 3.8–4.9)
Alkaline Phosphatase: 120 IU/L (ref 48–121)
BUN/Creatinine Ratio: 22 (ref 9–23)
BUN: 19 mg/dL (ref 6–24)
Bilirubin Total: 0.3 mg/dL (ref 0.0–1.2)
CO2: 24 mmol/L (ref 20–29)
Calcium: 9.7 mg/dL (ref 8.7–10.2)
Chloride: 100 mmol/L (ref 96–106)
Creatinine, Ser: 0.86 mg/dL (ref 0.57–1.00)
GFR calc Af Amer: 88 mL/min/{1.73_m2} (ref >59)
GFR calc non Af Amer: 76 mL/min/{1.73_m2} (ref >59)
Globulin, Total: 2.5 g/dL (ref 1.5–4.5)
Glucose: 111 mg/dL — ABNORMAL HIGH (ref 65–99)
Potassium: 4.5 mmol/L (ref 3.5–5.2)
Sodium: 136 mmol/L (ref 134–144)
Total Protein: 6.9 g/dL (ref 6.0–8.5)

## 2019-06-20 NOTE — Progress Notes (Signed)
Sheri [name] [address]  [place]  [place], [place] [address]  Main: [phone]  Fax: [phone]   Gastroenterology Consultation  Referring Provider:     [name] * Primary Care Physician:  Olin Hauser, DO Reason for Consultation:     Abdominal pain        HPI:    Chief Complaint  Patient presents with  . New Patient (Initial Visit)  . Abdominal Pain    Patient stated that she has had RUQ pain for two years.  . Diarrhea    Patient stated that she has had diarrhea for the past two years.  . Emesis    Patient stated that intermittently vomits yellow bile.    Sheri [name] is a 54 y.o. y/o female referred for consultation & management  by Dr. Parks Ranger, Devonne Doughty, DO.  Abdominal pain, right upper quadrant intermittently for the last 2 years.  Has had previous history of cholecystectomy.  Dull, 5/10, nonradiating.  No liver enzymes available since October 2020.  Reports history of a previous colonoscopy and polyps were found.  No family history of colon cancer.  No prior EGD.  Past Medical History:  Diagnosis Date  . Abnormal uterine bleeding   . Anxiety   . Coronary artery disease   . Heart disease   . History of C-section   . History of kidney stones   . Hypertension   . Morbid obesity ([place])     Past Surgical History:  Procedure Laterality Date  . CARDIAC CATHETERIZATION    . CARDIAC SURGERY    . [place]  . CHOLECYSTECTOMY  2006  . CORONARY ANGIOPLASTY    . CYSTOSCOPY  10/10/2016   Procedure: CYSTOSCOPY;  Surgeon: Will Bonnet, MD;  Location: [facility] ORS;  Service: Gynecology;;  . Consuela Mimes W/ URETERAL STENT PLACEMENT Right 12/20/2017   Procedure: CYSTOSCOPY WITH RETROGRADE PYELOGRAM/URETERAL STENT PLACEMENT;  Surgeon: Billey Co, MD;  Location: [facility];  Service: Urology;  Laterality: Right;  . CYSTOSCOPY WITH STENT PLACEMENT Left 11/01/2015   Procedure: CYSTOSCOPY WITH  STENT PLACEMENT;  Surgeon: Hollice Espy, MD;  Location: [facility];  Service: Urology;  Laterality: Left;  . CYSTOSCOPY/RETROGRADE/URETEROSCOPY Bilateral 11/01/2015   Procedure: CYSTOSCOPY/RETROGRADE/URETEROSCOPY;  Surgeon: Hollice Espy, MD;  Location: [facility];  Service: Urology;  Laterality: Bilateral;  . DILATION AND CURETTAGE OF UTERUS  2011  . ENDOMETRIAL ABLATION  2011  . LAPAROSCOPIC HYSTERECTOMY Bilateral 10/10/2016   Procedure: HYSTERECTOMY TOTAL LAPAROSCOPIC BILATERAL SALPINGECTOMY;  Surgeon: Will Bonnet, MD;  Location: [facility];  Service: Gynecology;  Laterality: Bilateral;  . LEFT HEART CATHETERIZATION WITH CORONARY ANGIOGRAM N/A 04/06/2014   Procedure: LEFT HEART CATHETERIZATION WITH CORONARY ANGIOGRAM;  Surgeon: Jettie Booze, MD;  Location: [facility] CATH LAB;  Service: Cardiovascular;  Laterality: N/A;    Prior to Admission medications   Medication Sig Start Date End Date Taking? Authorizing Provider  acetaminophen (TYLENOL) 325 MG tablet Take 2 tablets (650 mg total) by mouth every 6 (six) hours as needed. 02/01/19  Yes Carrie Mew, MD  albuterol (VENTOLIN HFA) 108 (90 Base) MCG/ACT inhaler INHALE 2 PUFFS INTO THE LUNGS EVERY 4 HOURS AS NEEDED FOR WHEEZING OR SHORTNESS OF BREATH (COUGH). 04/11/19  Yes Karamalegos, Devonne Doughty, DO  amphetamine-dextroamphetamine (ADDERALL) 10 MG tablet Take one tab (10mg ) in morning and half or a whole tab (5-10mg ) in afternoon or evening for inattention/focus, max dose in 24 hours is 20mg . 06/04/19  Yes Karamalegos, Devonne Doughty, DO  busPIRone (BUSPAR) 7.5 MG tablet Take 7.5 mg by mouth 3 (three) times daily. Patient is taking 1 in the morning 1 in afternoon and 1 and half in the evening. 02/14/19  Yes Karamalegos, Devonne Doughty, DO  Chromium 200 MCG CAPS Take by mouth. 01/02/17  Yes Karamalegos, Devonne Doughty, DO  clopidogrel (PLAVIX) 75 MG tablet TAKE 1 TABLET BY MOUTH EVERY DAY 01/02/19  Yes Jettie Booze, MD  escitalopram (LEXAPRO) 20 MG  tablet TAKE 1 TABLET BY MOUTH EVERY DAY 01/25/19  Yes Karamalegos, Devonne Doughty, DO  furosemide (LASIX) 20 MG tablet Take 1 tablet (20 mg total) by mouth daily. 12/25/18  Yes Jettie Booze, MD  isosorbide mononitrate (IMDUR) 30 MG 24 hr tablet TAKE 1 TABLET (30 MG TOTAL) BY MOUTH DAILY. PLEASE KEEP UPCOMING APPT FOR FUTURE REFILLS. 12/10/18  Yes Jettie Booze, MD  levothyroxine (SYNTHROID) 25 MCG tablet TAKE 1 TABLET (25 MCG TOTAL) BY MOUTH DAILY BEFORE BREAKFAST. 11/26/18  Yes Karamalegos, Alexander J, DO  lidocaine (LIDODERM) 5 % Place 1 patch onto the skin every 12 (twelve) hours. Remove & Discard patch within 12 hours or as directed by MD 02/01/19  Yes Carrie Mew, MD  lisinopril (ZESTRIL) 5 MG tablet Take 1 tablet (5 mg total) by mouth daily. 01/22/19  Yes Jettie Booze, MD  Melatonin 1 MG CAPS Take 10 mg by mouth at bedtime. 01/02/17  Yes Karamalegos, Devonne Doughty, DO  metoprolol tartrate (LOPRESSOR) 25 MG tablet Take 1 tablet (25 mg total) by mouth 2 (two) times daily. 02/13/19  Yes Jettie Booze, MD  nitroGLYCERIN (NITROSTAT) 0.4 MG SL tablet Place 1 tablet (0.4 mg total) under the tongue every 5 (five) minutes x 3 doses as needed for chest pain. 08/01/17  Yes Jettie Booze, MD  ondansetron (ZOFRAN ODT) 4 MG disintegrating tablet Take 1 tablet (4 mg total) by mouth every 8 (eight) hours as needed for nausea or vomiting. 05/12/19  Yes Karamalegos, Devonne Doughty, DO  rosuvastatin (CRESTOR) 20 MG tablet TAKE 1 TABLET DAILY (PLEASE KEEP UPCOMING APPOINTMENT IN OCTOBER WITH DR Irish Lack FOR FUTURE REFILLS) 01/15/19  Yes Jettie Booze, MD  tamsulosin (FLOMAX) 0.4 MG CAPS capsule TAKE 1 CAPSULE BY MOUTH EVERY DAY 03/17/19  Yes Billey Co, MD  traZODone (DESYREL) 100 MG tablet TAKE 1 TABLET BY MOUTH EVERYDAY AT BEDTIME 02/14/19  Yes Karamalegos, Devonne Doughty, DO  Vitamin D, Cholecalciferol, 1000 units CAPS Take 1,000 Units by mouth daily.    Yes [provider]    Family History  Problem Relation Age of Onset  . Heart attack Father 84       died of MI at age 54  . High blood pressure Father   . High Cholesterol Father   . Heart disease Father   . Sudden death Father   . Hypertension Mother   . Hyperlipidemia Mother   . Hypothyroidism Mother   . Kidney Stones Sister   . Kidney Stones Brother   . Stroke Paternal Grandfather   . Liver cancer Maternal Grandmother   . Kidney disease Neg Hx   . Bladder Cancer Neg Hx      Social History   Tobacco Use  . Smoking status: Former Smoker    Packs/day: 1.00    Types: Cigarettes  . Smokeless tobacco: Former Systems developer  . Tobacco comment: quit 20 years  Substance Use Topics  . Alcohol use: No    Alcohol/week: 0.0 standard  drinks  . Drug use: No    Allergies as of 06/19/2019 - Review Complete 06/19/2019  Allergen Reaction Noted  . Latex Dermatitis 06/21/2015  . Seasonal ic [cholestatin]  06/04/2019  . Penicillins Rash 10/21/2015    Review of Systems:    All systems reviewed and negative except where noted in HPI.   Physical Exam:  BP 112/78   Pulse 79   Temp (!) 97.5 F (36.4 C) (Oral)   Ht 5\' 5"  (1.651 m)   Wt 253 lb 3.2 oz (114.9 kg)   LMP 09/13/2016   BMI 42.13 kg/m  Patient's last menstrual period was 09/13/2016. Psych:  Alert and cooperative. Normal mood and affect. General:   Alert,  Well-developed, well-nourished, pleasant and cooperative in NAD Head:  Normocephalic and atraumatic. Eyes:  Sclera clear, no icterus.   Conjunctiva pink. Ears:  Normal auditory acuity. Nose:  No deformity, discharge, or lesions. Mouth:  No deformity or lesions,oropharynx pink & moist. Neck:  Supple; no masses or thyromegaly. Abdomen:  Normal bowel sounds.  No bruits.  Soft, non-tender and non-distended without masses, hepatosplenomegaly or hernias noted.  No guarding or rebound tenderness.    Msk:  Symmetrical without gross deformities. Good, equal movement & strength  bilaterally. Pulses:  Normal pulses noted. Extremities:  No clubbing or edema.  No cyanosis. Neurologic:  Alert and oriented x3;  grossly normal neurologically. Skin:  Intact without significant lesions or rashes. No jaundice. Lymph Nodes:  No significant cervical adenopathy. Psych:  Alert and cooperative. Normal mood and affect.   Labs: CBC    Component Value Date/Time   WBC 9.6 02/01/2019 1500   RBC 4.68 02/01/2019 1500   HGB 13.2 02/01/2019 1500   HGB 14.4 05/14/2017 1145   HCT 37.9 02/01/2019 1500   HCT 42.0 05/14/2017 1145   PLT 247 02/01/2019 1500   PLT 291 04/11/2016 1616   MCV 81.0 02/01/2019 1500   MCV 81 05/14/2017 1145   MCV 85 05/07/2014 1018   MCH 28.2 02/01/2019 1500   MCHC 34.8 02/01/2019 1500   RDW 13.2 02/01/2019 1500   RDW 14.7 05/14/2017 1145   RDW 13.6 05/07/2014 1018   LYMPHSABS 2,044 11/05/2018 0826   LYMPHSABS 2.8 05/14/2017 1145   MONOABS 0.7 12/20/2017 0742   EOSABS 159 11/05/2018 0826   EOSABS 0.2 05/14/2017 1145   BASOSABS 49 11/05/2018 0826   BASOSABS 0.0 05/14/2017 1145   CMP     Component Value Date/Time   NA 136 06/19/2019 1625   NA 139 05/08/2014 0550   K 4.5 06/19/2019 1625   K 3.7 05/08/2014 0550   CL 100 06/19/2019 1625   CL 103 05/08/2014 0550   CO2 24 06/19/2019 1625   CO2 30 05/08/2014 0550   GLUCOSE 111 (H) 06/19/2019 1625   GLUCOSE 101 (H) 02/01/2019 1500   GLUCOSE 124 (H) 05/08/2014 0550   BUN 19 06/19/2019 1625   BUN 14 05/08/2014 0550   CREATININE 0.86 06/19/2019 1625   CREATININE 0.71 11/05/2018 0826   CALCIUM 9.7 06/19/2019 1625   CALCIUM 9.1 05/08/2014 0550   PROT 6.9 06/19/2019 1625   PROT 7.0 05/07/2014 1018   ALBUMIN 4.4 06/19/2019 1625   ALBUMIN 3.9 05/07/2014 1018   AST 37 06/19/2019 1625   AST 70 (H) 05/07/2014 1018   ALT 49 (H) 06/19/2019 1625   ALT 51 05/07/2014 1018   ALKPHOS 120 06/19/2019 1625   ALKPHOS 97 05/07/2014 1018   BILITOT 0.3 06/19/2019 1625   BILITOT 0.6 05/07/2014  [place] 06/19/2019 1625   GFRNONAA 97 11/05/2018 0826   GFRAA 88 06/19/2019 1625   GFRAA 112 11/05/2018 0826    Imaging Studies: No results found.  Assessment and Plan:   Sheri [name] is a 54 y.o. y/o female has been referred for right upper quadrant abdominal pain and previous history of cholecystectomy  Obtain CMP Patient due for screening colonoscopy, and EGD also indicated at this time due to abdominal pain and intermittent dysphagia  I have discussed alternative options, risks & benefits,  which include, but are not limited to, bleeding, infection, perforation,respiratory complication & drug reaction.  The patient agrees with this plan & written consent will be obtained.     Dr Sheri [name]  Speech recognition software was used to dictate the above note.

## 2019-07-01 ENCOUNTER — Ambulatory Visit
Admission: RE | Admit: 2019-07-01 | Discharge: 2019-07-01 | Disposition: A | Discharge status: Home / Self Care | Payer: BC Managed Care – PPO | Source: Ambulatory Visit | Attending: Gastroenterology | Admitting: Gastroenterology

## 2019-07-01 ENCOUNTER — Other Ambulatory Visit: Payer: Self-pay

## 2019-07-01 DIAGNOSIS — N200 Calculus of kidney: Secondary | ICD-10-CM | POA: Diagnosis not present

## 2019-07-01 DIAGNOSIS — R1084 Generalized abdominal pain: Secondary | ICD-10-CM

## 2019-07-02 DIAGNOSIS — F988 Other specified behavioral and emotional disorders with onset usually occurring in childhood and adolescence: Secondary | ICD-10-CM

## 2019-07-02 MED ORDER — AMPHETAMINE-DEXTROAMPHETAMINE 10 MG PO TABS: ORAL_TABLET | ORAL | 0 refills | Status: DC

## 2019-07-08 DIAGNOSIS — F509 Eating disorder, unspecified: Secondary | ICD-10-CM | POA: Diagnosis not present

## 2019-07-09 ENCOUNTER — Other Ambulatory Visit: Payer: Self-pay

## 2019-07-09 ENCOUNTER — Other Ambulatory Visit
Admission: RE | Admit: 2019-07-09 | Discharge: 2019-07-09 | Disposition: A | Discharge status: Home / Self Care | Payer: BC Managed Care – PPO | Source: Ambulatory Visit | Attending: Gastroenterology | Admitting: Gastroenterology

## 2019-07-09 ENCOUNTER — Other Ambulatory Visit: Payer: Self-pay | Admitting: Gastroenterology

## 2019-07-09 DIAGNOSIS — Z20822 Contact with and (suspected) exposure to covid-19: Secondary | ICD-10-CM | POA: Insufficient documentation

## 2019-07-09 DIAGNOSIS — Z01812 Encounter for preprocedural laboratory examination: Secondary | ICD-10-CM | POA: Diagnosis not present

## 2019-07-09 LAB — SARS CORONAVIRUS 2 (TAT 6-24 HRS): SARS Coronavirus 2: NEGATIVE

## 2019-07-09 MED ORDER — CLENPIQ 10-3.5-12 MG-GM -GM/160ML PO SOLN: ORAL | 0 refills | Status: DC

## 2019-07-10 ENCOUNTER — Encounter: Payer: Self-pay | Admitting: Gastroenterology

## 2019-07-11 ENCOUNTER — Encounter
Admission: RE | Disposition: A | Discharge status: Home / Self Care | Payer: Self-pay | Source: Home / Self Care | Attending: Gastroenterology

## 2019-07-11 ENCOUNTER — Telehealth: Payer: Self-pay

## 2019-07-11 ENCOUNTER — Telehealth: Payer: Self-pay | Admitting: Gastroenterology

## 2019-07-11 ENCOUNTER — Ambulatory Visit: Payer: BC Managed Care – PPO | Admitting: Certified Registered Nurse Anesthetist

## 2019-07-11 ENCOUNTER — Other Ambulatory Visit: Payer: Self-pay

## 2019-07-11 ENCOUNTER — Ambulatory Visit
Admission: RE | Admit: 2019-07-11 | Discharge: 2019-07-11 | Disposition: A | Discharge status: Home / Self Care | Payer: BC Managed Care – PPO | Source: Home / Self Care | Attending: Gastroenterology | Admitting: Gastroenterology

## 2019-07-11 SURGERY — COLONOSCOPY WITH PROPOFOL
Anesthesia: General

## 2019-07-11 NOTE — Telephone Encounter (Signed)
Called patient but had to leave her a voicemail. I will also send her a MyChart message since this is how we usually communicate to let her know what her ultrasound results were and what Dr. Bonna Gains recommends.

## 2019-07-11 NOTE — Addendum Note (Signed)
Addended by: Vonda Antigua on: 07/11/2019 11:10 AM   Modules accepted: Orders

## 2019-07-11 NOTE — Telephone Encounter (Signed)
-----   Message from Virgel Manifold, MD sent at 07/11/2019 11:13 AM EDT ----- Sheri [name] please let the patient know, her ultrasound showed fatty liver. This is a benign findings that is reversible with Diet, weight loss, and exercise along with avoiding hepatotoxic drugs including alcohol  I have also ordered further lab tests for this. Please have her do a virtual visit or clinic appointment in 4-6 weeks

## 2019-07-11 NOTE — Telephone Encounter (Signed)
LVM  For patient to call the office to schedule 6wk F/U per [facility]

## 2019-07-27 ENCOUNTER — Other Ambulatory Visit: Payer: Self-pay | Admitting: Family Medicine

## 2019-07-27 DIAGNOSIS — F419 Anxiety disorder, unspecified: Secondary | ICD-10-CM

## 2019-07-27 DIAGNOSIS — F32A Depression, unspecified: Secondary | ICD-10-CM

## 2019-08-21 ENCOUNTER — Other Ambulatory Visit: Payer: Self-pay | Admitting: Urology

## 2019-08-21 DIAGNOSIS — N200 Calculus of kidney: Secondary | ICD-10-CM

## 2019-09-19 ENCOUNTER — Other Ambulatory Visit: Payer: Self-pay | Admitting: Urology

## 2019-09-19 DIAGNOSIS — N200 Calculus of kidney: Secondary | ICD-10-CM

## 2019-09-22 ENCOUNTER — Telehealth: Payer: Self-pay | Admitting: Urology

## 2019-09-22 NOTE — Telephone Encounter (Signed)
Sheri [name] has not been seen since 05/2018.  She will need an office visit prior to refilling her Flomax.

## 2019-09-23 NOTE — Telephone Encounter (Signed)
Left message for her to schedule an appt. For med refill.

## 2019-10-24 ENCOUNTER — Other Ambulatory Visit: Payer: Self-pay | Admitting: Family Medicine

## 2019-10-24 DIAGNOSIS — F32A Depression, unspecified: Secondary | ICD-10-CM

## 2019-10-24 NOTE — Telephone Encounter (Signed)
Requested  medications are  due for refill today yes  Requested medications are on the active medication list yes  Last refill 8/28  Future visit scheduled no  Notes to clinic I do not see a valid office visit within 6 months that addresses this med/dx, please assess.

## 2019-10-30 ENCOUNTER — Other Ambulatory Visit: Payer: Self-pay | Admitting: Family Medicine

## 2019-10-30 DIAGNOSIS — F411 Generalized anxiety disorder: Secondary | ICD-10-CM

## 2019-10-30 NOTE — Telephone Encounter (Signed)
Requested medication (s) are due for refill today: yes  Requested medication (s) are on the active medication list: yes  Last refill: 10/01/19  Future visit scheduled: no  Notes to clinic: historical provider   Requested Prescriptions  Pending Prescriptions Disp Refills   busPIRone (BUSPAR) 7.5 MG tablet [Pharmacy Med Name: BUSPIRONE HCL 7.5 MG TABLET] 90 tablet 17    Sig: TAKE 1 TABLET (7.5 MG TOTAL) BY MOUTH 3 (THREE) TIMES DAILY.      Psychiatry: Anxiolytics/Hypnotics - Non-controlled Passed - 10/30/2019  1:29 AM      Passed - Valid encounter within last 6 months    Recent Outpatient Visits           4 months ago Attention deficit disorder (ADD) without hyperactivity   [place], DO   5 months ago RUQ abdominal pain   [place], DO   7 months ago COVID-19 virus infection   [place], DO   8 months ago Prediabetes   [facility] Olin Hauser, DO   11 months ago Annual physical exam   [place], DO

## 2019-11-04 ENCOUNTER — Other Ambulatory Visit: Payer: Self-pay | Admitting: Family Medicine

## 2019-11-04 ENCOUNTER — Other Ambulatory Visit: Payer: Self-pay | Admitting: Interventional Cardiology

## 2019-11-04 DIAGNOSIS — F419 Anxiety disorder, unspecified: Secondary | ICD-10-CM

## 2019-11-04 DIAGNOSIS — F32A Depression, unspecified: Secondary | ICD-10-CM

## 2019-11-04 MED ORDER — ESCITALOPRAM OXALATE 20 MG PO TABS: 20.0000 mg | ORAL_TABLET | Freq: Every day | ORAL | 1 refills | Status: DC

## 2019-12-01 ENCOUNTER — Other Ambulatory Visit: Payer: Self-pay | Admitting: Family Medicine

## 2019-12-01 DIAGNOSIS — F5104 Psychophysiologic insomnia: Secondary | ICD-10-CM

## 2019-12-01 NOTE — Telephone Encounter (Signed)
Requested Prescriptions  Pending Prescriptions Disp Refills  . traZODone (DESYREL) 100 MG tablet [Pharmacy Med Name: TRAZODONE 100 MG TABLET] 30 tablet 2    Sig: TAKE 1 TABLET BY MOUTH EVERYDAY AT BEDTIME     Psychiatry: Antidepressants - Serotonin Modulator Passed - 12/01/2019  1:22 AM      Passed - Completed PHQ-2 or PHQ-9 in the last 360 days      Passed - Valid encounter within last 6 months    Recent Outpatient Visits          6 months ago Attention deficit disorder (ADD) without hyperactivity   [place], DO   6 months ago RUQ abdominal pain   [place], DO   8 months ago COVID-19 virus infection   [facility] Olin Hauser, DO   9 months ago Prediabetes   [place], DO   1 year ago Annual physical exam   [place], Devonne Doughty, DO

## 2019-12-05 ENCOUNTER — Other Ambulatory Visit: Payer: Self-pay | Admitting: Interventional Cardiology

## 2019-12-05 ENCOUNTER — Telehealth: Payer: Self-pay | Admitting: Radiology

## 2019-12-05 ENCOUNTER — Other Ambulatory Visit: Payer: Self-pay

## 2019-12-05 ENCOUNTER — Other Ambulatory Visit: Payer: Self-pay | Admitting: Urology

## 2019-12-05 ENCOUNTER — Telehealth: Payer: Self-pay

## 2019-12-05 ENCOUNTER — Ambulatory Visit
Admission: EM | Admit: 2019-12-05 | Discharge: 2019-12-05 | Disposition: A | Discharge status: Home / Self Care | Payer: BC Managed Care – PPO | Attending: Emergency Medicine | Admitting: Emergency Medicine

## 2019-12-05 DIAGNOSIS — N12 Tubulo-interstitial nephritis, not specified as acute or chronic: Secondary | ICD-10-CM | POA: Insufficient documentation

## 2019-12-05 DIAGNOSIS — N200 Calculus of kidney: Secondary | ICD-10-CM

## 2019-12-05 LAB — URINALYSIS, COMPLETE (UACMP) WITH MICROSCOPIC
Bilirubin Urine: NEGATIVE
Glucose, UA: NEGATIVE mg/dL
Nitrite: POSITIVE — AB
Protein, ur: 30 mg/dL — AB
Specific Gravity, Urine: 1.02 (ref 1.005–1.030)
WBC, UA: >50 WBC/hpf (ref 0–5)
pH: 5.5 (ref 5.0–8.0)

## 2019-12-05 MED ORDER — PHENAZOPYRIDINE HCL 200 MG PO TABS: 200.0000 mg | ORAL_TABLET | Freq: Three times a day (TID) | ORAL | 0 refills | Status: DC

## 2019-12-05 MED ORDER — CIPROFLOXACIN HCL 500 MG PO TABS: 500.0000 mg | ORAL_TABLET | Freq: Two times a day (BID) | ORAL | 0 refills | Status: DC

## 2019-12-05 NOTE — ED Triage Notes (Signed)
Pt is here with vaginal odor and frequent urination that started Wednesday, pt complaints of mild blower back pain and chills. Pt has not taken any meds to relieve discomfort,

## 2019-12-05 NOTE — Telephone Encounter (Signed)
Patient Sheri [name] saying she has cold chills, incontinence & nausea. Per Dr Bernardo Heater [facility] recommending she see her PCP and to call back with any questions.

## 2019-12-05 NOTE — Telephone Encounter (Signed)
Incoming call on triage line with patient c/o chills, fever, nausea, no vomitting. Back/flank pain mostly on the left side with strong smelling urine. Denies burning or pain with urination.  Per Dr Bernardo Heater and Zara Council she needs to see her PCP, urgent care or ED.

## 2019-12-05 NOTE — Telephone Encounter (Signed)
ERROR

## 2019-12-05 NOTE — ED Provider Notes (Signed)
[facility]    CSN: [id number] Arrival date & time: 12/05/19  1025      History   Chief Complaint Chief Complaint  Patient presents with  . Urinary Tract Infection    HPI Caprice Mccaffrey Tuller is a 54 y.o. female who presents with urinary frequency x 2 days, low back pain and chills.  She also noticed a vaginal odor. Has not been taking any meds for symptoms.     Past Medical History:  Diagnosis Date  . Abnormal uterine bleeding   . Anxiety   . Coronary artery disease   . Heart disease   . History of C-section   . History of kidney stones   . Hypertension   . Morbid obesity [facility])     Patient Active Problem List   Diagnosis Date Noted  . Attention deficit disorder (ADD) without hyperactivity 06/04/2019  . Recurrent major depression in partial remission ([place]) 06/14/2018  . Psychophysiological insomnia 06/14/2018  . Right ureteral stone 12/20/2017  . Prediabetes 07/04/2017  . Essential hypertension 07/04/2017  . Status post laparoscopic hysterectomy 10/10/2016  . Vitamin D deficiency 09/21/2016  . Chronic fatigue 09/21/2016  . Acquired hypothyroidism 09/21/2016  . Menorrhagia with irregular cycle 08/29/2016  . Old MI (myocardial infarction) 05/03/2016  . Nocturnal leg cramps 12/30/2015  . Right nephrolithiasis 12/01/2015  . Pulmonary nodule 12/01/2015  . GAD (generalized anxiety disorder) 06/21/2015  . Plantar fasciitis, bilateral 06/21/2015  . Gross hematuria 10/04/2014  . Urinary frequency 10/04/2014  . Morbid obesity with BMI of 40.0-44.9, adult ([place]) 07/06/2014  . Coronary artery disease of native artery of native heart with stable angina pectoris ([place]) 04/21/2014  . Hyperlipidemia 04/21/2014  . History of ST elevation myocardial infarction (STEMI) 04/06/2014    Past Surgical History:  Procedure Laterality Date  . CARDIAC CATHETERIZATION    . CARDIAC SURGERY    . [place]  . CHOLECYSTECTOMY  2006  . CORONARY  ANGIOPLASTY    . CYSTOSCOPY  10/10/2016   Procedure: CYSTOSCOPY;  Surgeon: Will Bonnet, MD;  Location: [facility] ORS;  Service: Gynecology;;  . Consuela Mimes W/ URETERAL STENT PLACEMENT Right 12/20/2017   Procedure: CYSTOSCOPY WITH RETROGRADE PYELOGRAM/URETERAL STENT PLACEMENT;  Surgeon: Billey Co, MD;  Location: [facility] OR[facility];  Service: Urology;  Laterality: Right;  . CYSTOSCOPY WITH STENT PLACEMENT Left 11/01/2015   Procedure: CYSTOSCOPY WITH STENT PLACEMENT;  Surgeon: Hollice Espy, MD;  Location: [facility] OR[facility];  Service: Urology;  Laterality: Left;  . CYSTOSCOPY/RETROGRADE/URETEROSCOPY Bilateral 11/01/2015   Procedure: CYSTOSCOPY/RETROGRADE/URETEROSCOPY;  Surgeon: Hollice Espy, MD;  Location: [facility] OR[facility];  Service: Urology;  Laterality: Bilateral;  . DILATION AND CURETTAGE OF UTERUS  2011  . ENDOMETRIAL ABLATION  2011  . LAPAROSCOPIC HYSTERECTOMY Bilateral 10/10/2016   Procedure: HYSTERECTOMY TOTAL LAPAROSCOPIC BILATERAL SALPINGECTOMY;  Surgeon: Will Bonnet, MD;  Location: [facility] OR[facility];  Service: Gynecology;  Laterality: Bilateral;  . LEFT HEART CATHETERIZATION WITH CORONARY ANGIOGRAM N/A 04/06/2014   Procedure: LEFT HEART CATHETERIZATION WITH CORONARY ANGIOGRAM;  Surgeon: Jettie Booze, MD;  Location: [facility];  Service: Cardiovascular;  Laterality: N/A;    OB History    Gravida  3   Para  3   Term  3   Preterm      AB      Living  3     SAB      TAB      Ectopic      Multiple  Live Births  3            Home Medications    Prior to Admission medications   Medication Sig Start Date End Date Taking? Authorizing Provider  acetaminophen (TYLENOL) 325 MG tablet Take 2 tablets (650 mg total) by mouth every 6 (six) hours as needed. 02/01/19   Carrie Mew, MD  albuterol (VENTOLIN HFA) 108 (90 Base) MCG/ACT inhaler INHALE 2 PUFFS INTO THE LUNGS EVERY 4 HOURS AS NEEDED FOR WHEEZING OR SHORTNESS OF BREATH (COUGH). 04/11/19   Karamalegos, Devonne Doughty, DO    amphetamine-dextroamphetamine (ADDERALL) 10 MG tablet Take one tab (10mg ) in morning and half or a whole tab (5-10mg ) in afternoon or evening for inattention/focus, max dose in 24 hours is 20mg . 07/02/19   Karamalegos, Alexander J, DO  busPIRone (BUSPAR) 7.5 MG tablet TAKE 1 TABLET (7.5 MG TOTAL) BY MOUTH 3 (THREE) TIMES DAILY. 10/30/19   Karamalegos, Devonne Doughty, DO  Chromium 200 MCG CAPS Take by mouth. 01/02/17   Karamalegos, Devonne Doughty, DO  clopidogrel (PLAVIX) 75 MG tablet TAKE 1 TABLET BY MOUTH EVERY DAY 01/02/19   Jettie Booze, MD  escitalopram (LEXAPRO) 20 MG tablet Take 1 tablet (20 mg total) by mouth daily. 11/04/19   Karamalegos, Devonne Doughty, DO  furosemide (LASIX) 20 MG tablet Take 1 tablet (20 mg total) by mouth daily. 12/25/18   Jettie Booze, MD  isosorbide mononitrate (IMDUR) 30 MG 24 hr tablet TAKE 1 TABLET (30 MG TOTAL) BY MOUTH DAILY. PLEASE KEEP UPCOMING APPT FOR FUTURE REFILLS. 12/10/18   Jettie Booze, MD  levothyroxine (SYNTHROID) 25 MCG tablet TAKE 1 TABLET (25 MCG TOTAL) BY MOUTH DAILY BEFORE BREAKFAST. 11/26/18   Karamalegos, Devonne Doughty, DO  lidocaine (LIDODERM) 5 % Place 1 patch onto the skin every 12 (twelve) hours. Remove & Discard patch within 12 hours or as directed by MD 02/01/19   Carrie Mew, MD  lisinopril (ZESTRIL) 5 MG tablet Take 1 tablet (5 mg total) by mouth daily. Please make overdue appt with Dr. Irish Lack before anymore refills. 1st attempt 11/05/19   Jettie Booze, MD  Melatonin 1 MG CAPS Take 10 mg by mouth at bedtime. 01/02/17   Karamalegos, Devonne Doughty, DO  metoprolol tartrate (LOPRESSOR) 25 MG tablet Take 1 tablet (25 mg total) by mouth 2 (two) times daily. 02/13/19   Jettie Booze, MD  nitroGLYCERIN (NITROSTAT) 0.4 MG SL tablet Place 1 tablet (0.4 mg total) under the tongue every 5 (five) minutes x 3 doses as needed for chest pain. 08/01/17   Jettie Booze, MD  ondansetron (ZOFRAN ODT) 4 MG disintegrating tablet Take  1 tablet (4 mg total) by mouth every 8 (eight) hours as needed for nausea or vomiting. 05/12/19   Parks Ranger, Devonne Doughty, DO  rosuvastatin (CRESTOR) 20 MG tablet TAKE 1 TABLET DAILY (PLEASE KEEP UPCOMING APPOINTMENT IN OCTOBER WITH DR Irish Lack FOR FUTURE REFILLS) 01/15/19   Jettie Booze, MD  Sod Picosulfate-Mag Ox-Cit Acd (CLENPIQ) 10-3.5-12 MG-GM -GM/160ML SOLN Take 1 bottle at 5 PM followed by five 8 oz cups of water and repeat 5 hours before procedure. 07/09/19   Virgel Manifold, MD  tamsulosin (FLOMAX) 0.4 MG CAPS capsule TAKE 1 CAPSULE BY MOUTH EVERY DAY 09/19/19   McGowan, Larene Beach A, PA-C  traZODone (DESYREL) 100 MG tablet TAKE 1 TABLET BY MOUTH EVERYDAY AT BEDTIME 12/01/19   Karamalegos, Devonne Doughty, DO  Vitamin D, Cholecalciferol, 1000 units CAPS Take 1,000 Units by mouth daily.  [provider]    Family History Family History  Problem Relation Age of Onset  . Heart attack Father 71       died of MI at age 79  . High blood pressure Father   . High Cholesterol Father   . Heart disease Father   . Sudden death Father   . Hypertension Mother   . Hyperlipidemia Mother   . Hypothyroidism Mother   . Kidney Stones Sister   . Kidney Stones Brother   . Stroke Paternal Grandfather   . Liver cancer Maternal Grandmother   . Kidney disease Neg Hx   . Bladder Cancer Neg Hx     Social History Social History   Tobacco Use  . Smoking status: Former Smoker    Packs/day: 1.00    Types: Cigarettes  . Smokeless tobacco: Former Systems developer  . Tobacco comment: quit 20 years  Vaping Use  . Vaping Use: Never used  Substance Use Topics  . Alcohol use: No    Alcohol/week: 0.0 standard drinks  . Drug use: No     Allergies   Latex, Seasonal ic [cholestatin], and Penicillins   Review of Systems Review of Systems  Constitutional: Positive for chills and fever. Negative for diaphoresis.  HENT: Negative for congestion.   Respiratory: Negative for cough.    Gastrointestinal: Negative for abdominal pain, nausea and vomiting.  Genitourinary: Positive for dysuria, flank pain, frequency and urgency. Negative for vaginal discharge.       + incontinence  Musculoskeletal: Positive for back pain. Negative for myalgias.  Skin: Negative for rash.     Physical Exam Triage Vital Signs ED Triage Vitals  Enc Vitals Group     BP 12/05/19 1043 (!) 135/99     Pulse Rate 12/05/19 1043 87     Resp 12/05/19 1043 19     Temp 12/05/19 1043 98.7 F (37.1 C)     Temp Source 12/05/19 1043 Oral     SpO2 12/05/19 1043 98 %     Weight --      Height --      Head Circumference --      Peak Flow --      Pain Score 12/05/19 1042 4     Pain Loc --      Pain Edu? --      Excl. in [place]? --    No data found.  Updated Vital Signs BP (!) 135/99 (BP Location: Left Arm)   Pulse 87   Temp 98.7 F (37.1 C) (Oral)   Resp 19   LMP 09/13/2016   SpO2 98%   Visual Acuity Right Eye Distance:   Left Eye Distance:   Bilateral Distance:    Right Eye Near:   Left Eye Near:    Bilateral Near:     Physical Exam Physical Exam Vitals and nursing note reviewed.  Constitutional:      General: She is not in acute distress.    Appearance: She is not toxic-appearing.  HENT:     Head: Normocephalic.     Right Ear: External ear normal.     Left Ear: External ear normal.  Eyes:     General: No scleral icterus.    Conjunctiva/sclera: Conjunctivae normal.  Pulmonary:     Effort: Pulmonary effort is normal.  Abdominal:     General: Bowel sounds are normal.     Palpations: Abdomen is soft. There is no mass.     Tenderness: has mild tenderness on both  mid abdomen region where her kidneys are. There is no guarding or rebound.     Comments: - CVA tenderness   Musculoskeletal:        General: Normal range of motion.     Cervical back: Neck supple.     Comments: BACK- with muscular tenderness on area of complaint with palpation on lower lumbar region Skin:    General:  Skin is warm and dry.     Findings: No rash.  Neurological:     Mental Status: She is alert and oriented to person, place, and time.     Gait: Gait normal.  Psychiatric:        Mood and Affect: Mood normal.        Behavior: Behavior normal.        Thought Content: Thought content normal.        Judgment: Judgment normal.  \  UC Treatments / Results  Labs (all labs ordered are listed, but only abnormal results are displayed) Labs Reviewed  URINALYSIS, COMPLETE (UACMP) WITH MICROSCOPIC - Abnormal; Notable for the following components:      Result Value   APPearance HAZY (*)    Hgb urine dipstick SMALL (*)    Ketones, ur TRACE (*)    Protein, ur 30 (*)    Nitrite POSITIVE (*)    Leukocytes,Ua MODERATE (*)    Bacteria, UA MANY (*)    All other components within normal limits  URINE CULTURE    EKG   Radiology No results found.  Procedures Procedures (including critical care time)  Medications Ordered in UC Medications - No data to display  Initial Impression / Assessment and Plan / UC Course  I have reviewed the triage vital signs and the nursing notes. I suspect she is getting pyelonephritis and placed her on Cipro and Pyridium as noted. Urine was sent out for culture and we will call her if we need to change her antibiotic.  Pertinent labs  results that were available during my care of the patient were reviewed by me and considered in my medical decision making (see chart for details).  Final Clinical Impressions(s) / UC Diagnoses   Final diagnoses:  None   Discharge Instructions   None    ED Prescriptions    None     PDMP not reviewed this encounter.   Shelby Mattocks, PA-C 12/05/19 1118

## 2019-12-06 IMAGING — CR DG CHEST 2V
1 series · 2 of 2 positions shown · non-contrast
Comparison: May 07, 2014

CLINICAL DATA: Chest pain.

EXAM:
CHEST - 2 VIEW

[Series 1: dg chest 2 view · 0.14mm/px · 2 of 2 slices shown]
[im 1/2]
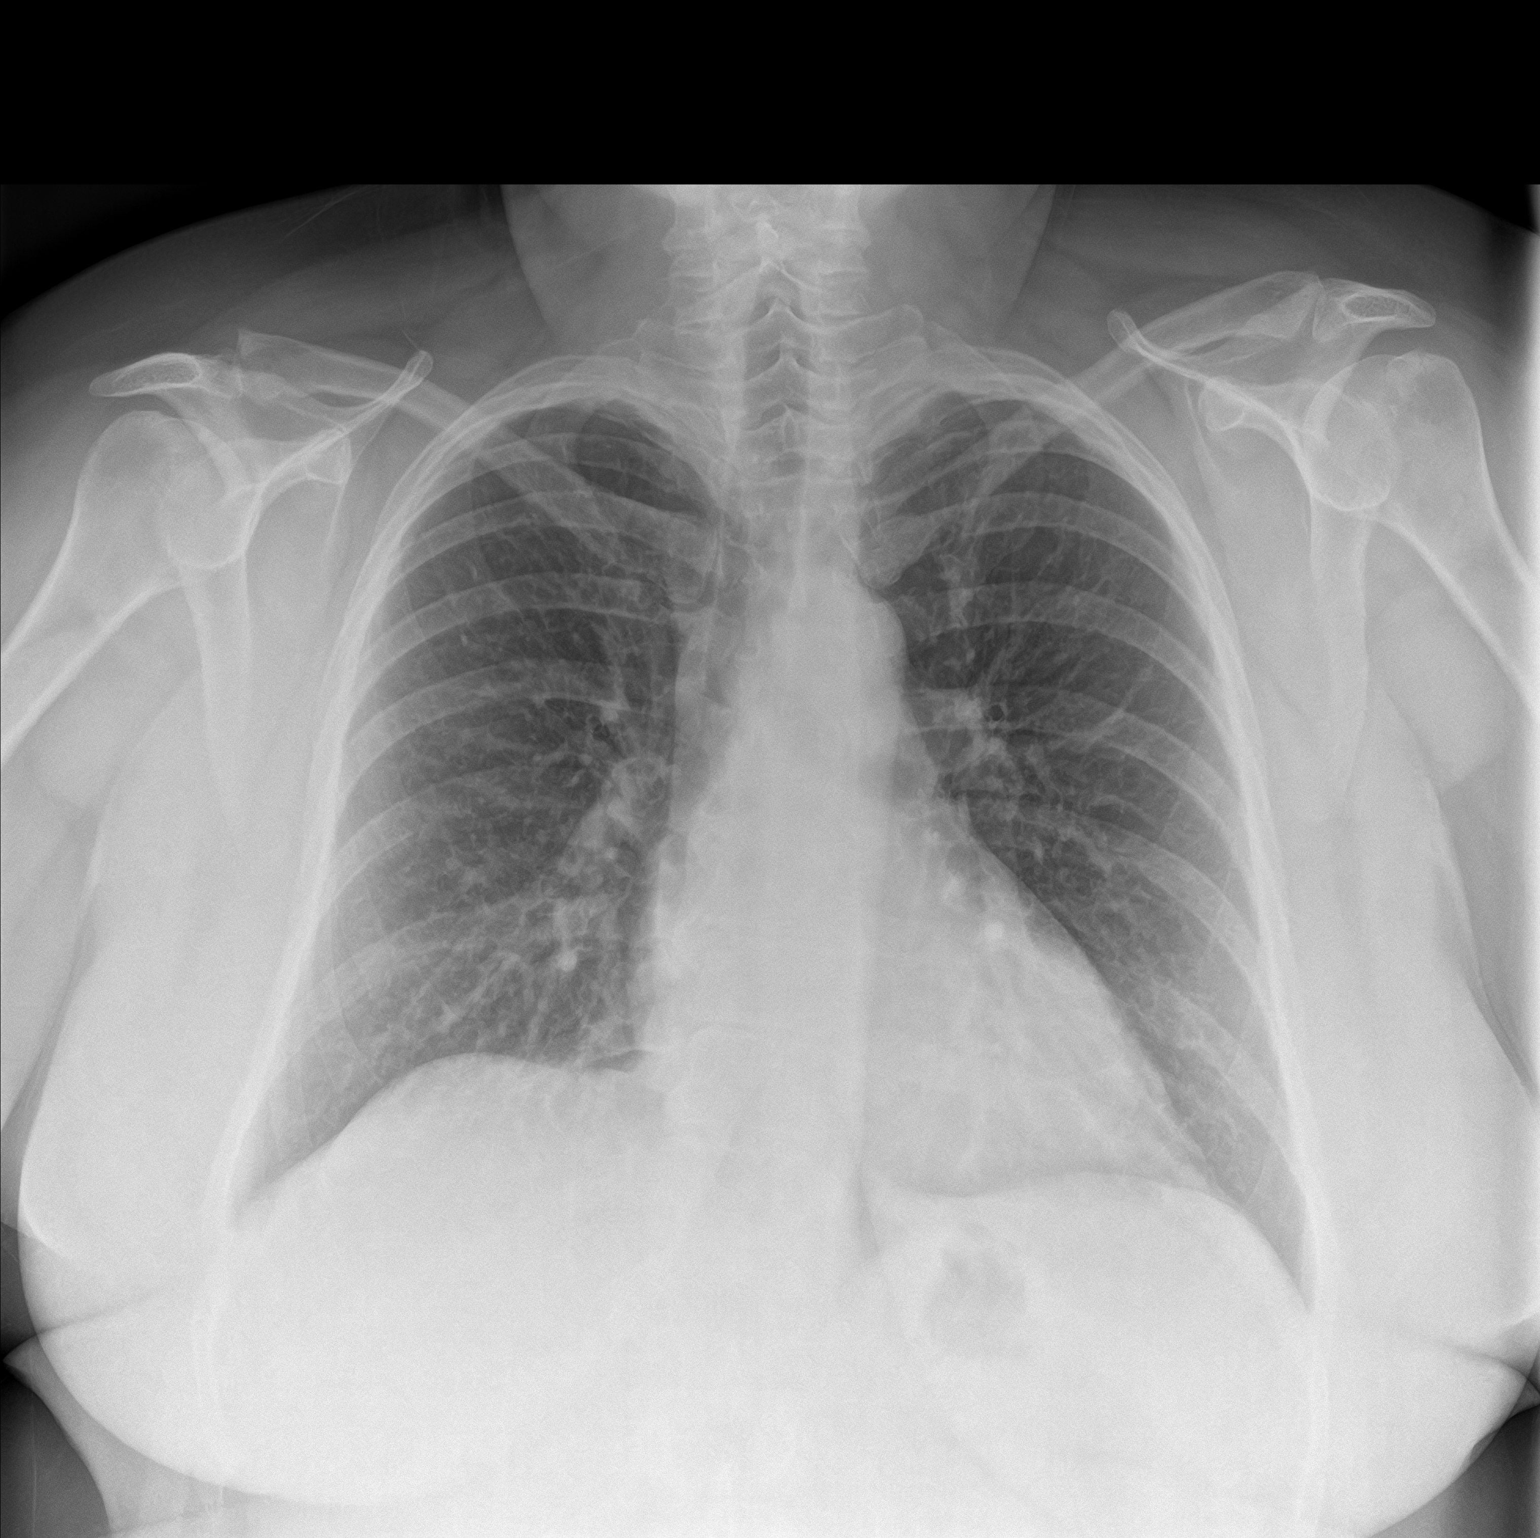
[im 2/2]
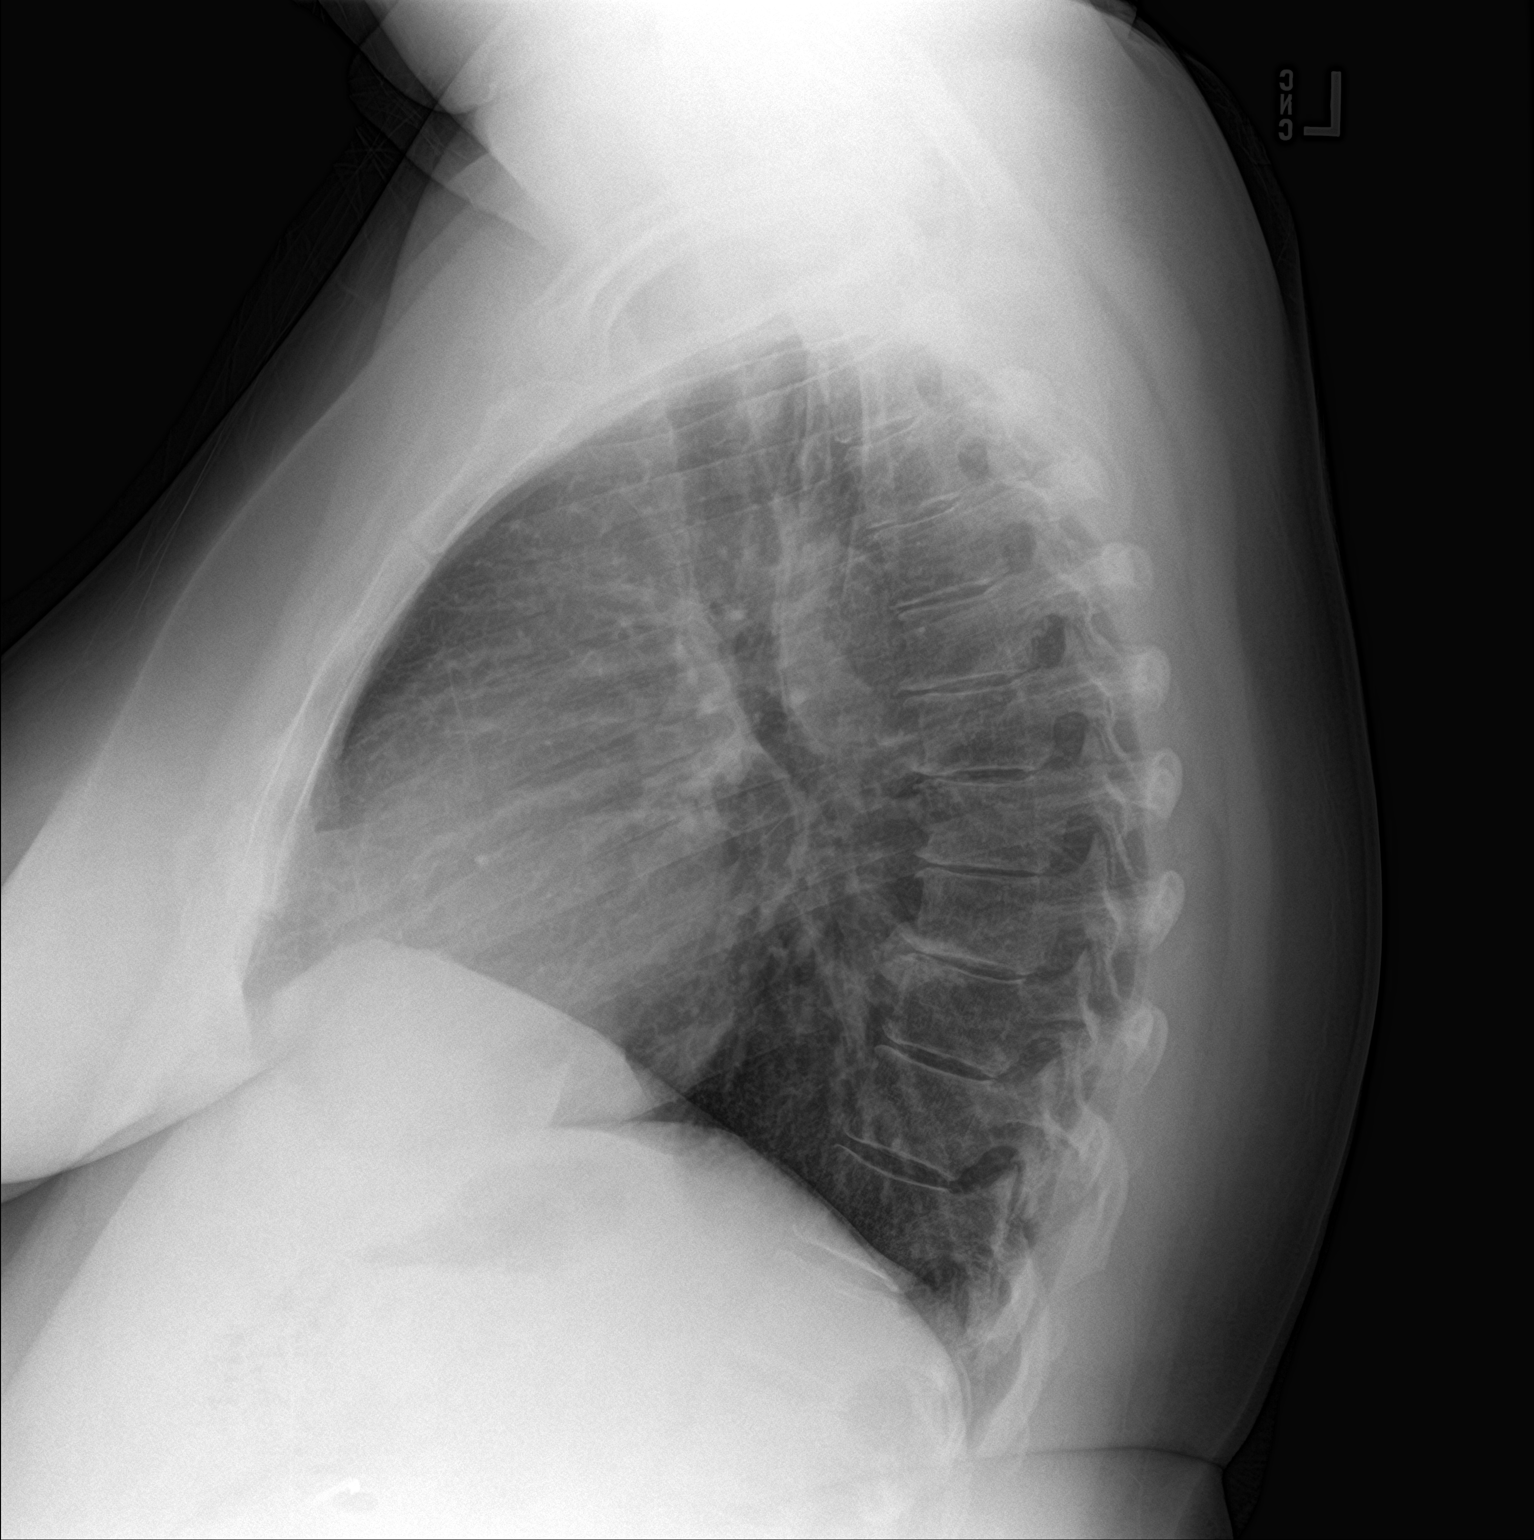

[2 of 2 positions shown; findings below may reference images not displayed]

FINDINGS: Heart, hila, mediastinum, lungs, and pleura are unremarkable.
IMPRESSION: No active cardiopulmonary disease.

## 2019-12-07 LAB — URINE CULTURE: Culture: >=100000 — AB

## 2019-12-09 DIAGNOSIS — I252 Old myocardial infarction: Secondary | ICD-10-CM | POA: Diagnosis not present

## 2019-12-09 DIAGNOSIS — R1084 Generalized abdominal pain: Secondary | ICD-10-CM | POA: Diagnosis not present

## 2019-12-09 DIAGNOSIS — N136 Pyonephrosis: Secondary | ICD-10-CM | POA: Diagnosis not present

## 2019-12-09 DIAGNOSIS — N201 Calculus of ureter: Secondary | ICD-10-CM | POA: Diagnosis not present

## 2019-12-09 DIAGNOSIS — Z87891 Personal history of nicotine dependence: Secondary | ICD-10-CM | POA: Diagnosis not present

## 2019-12-09 DIAGNOSIS — Z8249 Family history of ischemic heart disease and other diseases of the circulatory system: Secondary | ICD-10-CM | POA: Diagnosis not present

## 2019-12-09 DIAGNOSIS — N132 Hydronephrosis with renal and ureteral calculous obstruction: Secondary | ICD-10-CM | POA: Diagnosis not present

## 2019-12-09 DIAGNOSIS — Z20822 Contact with and (suspected) exposure to covid-19: Secondary | ICD-10-CM | POA: Diagnosis not present

## 2019-12-09 DIAGNOSIS — R109 Unspecified abdominal pain: Secondary | ICD-10-CM | POA: Diagnosis not present

## 2019-12-09 DIAGNOSIS — F419 Anxiety disorder, unspecified: Secondary | ICD-10-CM | POA: Diagnosis not present

## 2019-12-09 DIAGNOSIS — E785 Hyperlipidemia, unspecified: Secondary | ICD-10-CM | POA: Diagnosis not present

## 2019-12-09 DIAGNOSIS — E119 Type 2 diabetes mellitus without complications: Secondary | ICD-10-CM | POA: Diagnosis not present

## 2019-12-09 DIAGNOSIS — Z79899 Other long term (current) drug therapy: Secondary | ICD-10-CM | POA: Diagnosis not present

## 2019-12-09 DIAGNOSIS — I10 Essential (primary) hypertension: Secondary | ICD-10-CM | POA: Diagnosis not present

## 2019-12-09 DIAGNOSIS — I2510 Atherosclerotic heart disease of native coronary artery without angina pectoris: Secondary | ICD-10-CM | POA: Diagnosis not present

## 2019-12-09 DIAGNOSIS — N390 Urinary tract infection, site not specified: Secondary | ICD-10-CM | POA: Diagnosis not present

## 2019-12-09 DIAGNOSIS — N1330 Unspecified hydronephrosis: Secondary | ICD-10-CM | POA: Diagnosis not present

## 2019-12-09 DIAGNOSIS — Z6841 Body Mass Index (BMI) 40.0 and over, adult: Secondary | ICD-10-CM | POA: Diagnosis not present

## 2019-12-09 DIAGNOSIS — Z880 Allergy status to penicillin: Secondary | ICD-10-CM | POA: Diagnosis not present

## 2019-12-09 DIAGNOSIS — N200 Calculus of kidney: Secondary | ICD-10-CM | POA: Diagnosis not present

## 2019-12-09 DIAGNOSIS — F32A Depression, unspecified: Secondary | ICD-10-CM | POA: Diagnosis not present

## 2019-12-09 DIAGNOSIS — Z7902 Long term (current) use of antithrombotics/antiplatelets: Secondary | ICD-10-CM | POA: Diagnosis not present

## 2019-12-23 DIAGNOSIS — N200 Calculus of kidney: Secondary | ICD-10-CM | POA: Diagnosis not present

## 2019-12-23 DIAGNOSIS — Z87891 Personal history of nicotine dependence: Secondary | ICD-10-CM | POA: Diagnosis not present

## 2019-12-23 DIAGNOSIS — I10 Essential (primary) hypertension: Secondary | ICD-10-CM | POA: Diagnosis not present

## 2019-12-23 DIAGNOSIS — Z955 Presence of coronary angioplasty implant and graft: Secondary | ICD-10-CM | POA: Diagnosis not present

## 2019-12-23 DIAGNOSIS — Z880 Allergy status to penicillin: Secondary | ICD-10-CM | POA: Diagnosis not present

## 2019-12-23 DIAGNOSIS — Z91040 Latex allergy status: Secondary | ICD-10-CM | POA: Diagnosis not present

## 2019-12-23 DIAGNOSIS — E785 Hyperlipidemia, unspecified: Secondary | ICD-10-CM | POA: Diagnosis not present

## 2019-12-23 DIAGNOSIS — I252 Old myocardial infarction: Secondary | ICD-10-CM | POA: Diagnosis not present

## 2019-12-23 DIAGNOSIS — Z79899 Other long term (current) drug therapy: Secondary | ICD-10-CM | POA: Diagnosis not present

## 2019-12-31 ENCOUNTER — Other Ambulatory Visit: Payer: Self-pay | Admitting: Interventional Cardiology

## 2020-01-08 DIAGNOSIS — R319 Hematuria, unspecified: Secondary | ICD-10-CM | POA: Diagnosis not present

## 2020-01-08 DIAGNOSIS — N200 Calculus of kidney: Secondary | ICD-10-CM | POA: Diagnosis not present

## 2020-01-08 DIAGNOSIS — N202 Calculus of kidney with calculus of ureter: Secondary | ICD-10-CM | POA: Diagnosis not present

## 2020-01-12 ENCOUNTER — Other Ambulatory Visit: Payer: Self-pay | Admitting: Interventional Cardiology

## 2020-01-21 ENCOUNTER — Other Ambulatory Visit: Payer: Self-pay | Admitting: Interventional Cardiology

## 2020-01-22 ENCOUNTER — Other Ambulatory Visit: Payer: Self-pay | Admitting: Interventional Cardiology

## 2020-01-22 ENCOUNTER — Other Ambulatory Visit: Payer: Self-pay | Admitting: Family Medicine

## 2020-01-22 DIAGNOSIS — F411 Generalized anxiety disorder: Secondary | ICD-10-CM

## 2020-01-22 NOTE — Telephone Encounter (Signed)
Requested medications are due for refill today yes  Requested medications are on the active medication list yes  Last refill 11/29  Last visit Jan 2021  Future visit scheduled No  Notes to clinic Failed protocol due to no valid visit within 6  months. No upcoming visit scheduled.

## 2020-02-03 ENCOUNTER — Other Ambulatory Visit: Payer: Self-pay | Admitting: Interventional Cardiology

## 2020-02-11 ENCOUNTER — Other Ambulatory Visit: Payer: Self-pay | Admitting: Interventional Cardiology

## 2020-02-18 ENCOUNTER — Other Ambulatory Visit: Payer: Self-pay | Admitting: Interventional Cardiology

## 2020-02-22 ENCOUNTER — Encounter: Payer: Self-pay | Admitting: *Deleted

## 2020-02-22 ENCOUNTER — Other Ambulatory Visit: Payer: Self-pay | Admitting: Family Medicine

## 2020-02-22 DIAGNOSIS — F5104 Psychophysiologic insomnia: Secondary | ICD-10-CM

## 2020-02-22 NOTE — Telephone Encounter (Signed)
30 day courtesy refill given. Left message for pt to call and schedule appt.

## 2020-02-26 ENCOUNTER — Other Ambulatory Visit: Payer: Self-pay | Admitting: Interventional Cardiology

## 2020-03-20 ENCOUNTER — Other Ambulatory Visit: Payer: Self-pay | Admitting: Family Medicine

## 2020-03-20 DIAGNOSIS — F5104 Psychophysiologic insomnia: Secondary | ICD-10-CM

## 2020-03-20 NOTE — Telephone Encounter (Signed)
Requested medication (s) are due for refill today: Yes  Requested medication (s) are on the active medication list: Yes  Last refill:  02/24/20  Future visit scheduled: No  Notes to clinic:  Diagnosis code needed     Requested Prescriptions  Pending Prescriptions Disp Refills   traZODone (DESYREL) 100 MG tablet [Pharmacy Med Name: TRAZODONE 100 MG TABLET] 90 tablet 1    Sig: TAKE 1 TABLET BY MOUTH EVERYDAY AT BEDTIME      Psychiatry: Antidepressants - Serotonin Modulator Failed - 03/20/2020 12:34 PM      Failed - Valid encounter within last 6 months    Recent Outpatient Visits           9 months ago Attention deficit disorder (ADD) without hyperactivity   [place], DO   10 months ago RUQ abdominal pain   [facility] Hauser, DO   1 year ago COVID-19 virus infection   [place], DO   1 year ago Prediabetes   [place], DO   1 year ago Annual physical exam   [place], DO                Passed - Completed PHQ-2 or PHQ-9 in the last 360 days

## 2020-03-21 ENCOUNTER — Other Ambulatory Visit: Payer: Self-pay | Admitting: Urology

## 2020-03-21 DIAGNOSIS — N200 Calculus of kidney: Secondary | ICD-10-CM

## 2020-03-22 NOTE — Telephone Encounter (Signed)
Can you call to confirm which pharmacy? The one requested is in [facility]  Typically I can refill out of state for 30-90 day only, but I do not do long term refills out of state.  Just checking to see if she has moved or is only out of town etc.  [name], [place] [facility] 03/22/2020, 1:24 PM

## 2020-03-24 NOTE — Telephone Encounter (Signed)
Attempted to contact the patient, no answer. LMOM 

## 2020-03-25 ENCOUNTER — Other Ambulatory Visit: Payer: Self-pay

## 2020-03-25 DIAGNOSIS — F5104 Psychophysiologic insomnia: Secondary | ICD-10-CM

## 2020-03-25 MED ORDER — TRAZODONE HCL 100 MG PO TABS: 100.0000 mg | ORAL_TABLET | Freq: Every day | ORAL | 0 refills | Status: DC

## 2020-03-25 NOTE — Telephone Encounter (Signed)
Attempted to contact the patient on both phone number listed, no answer. LMOM

## 2020-04-23 ENCOUNTER — Other Ambulatory Visit: Payer: Self-pay | Admitting: Family Medicine

## 2020-04-23 DIAGNOSIS — F32A Depression, unspecified: Secondary | ICD-10-CM

## 2020-04-23 DIAGNOSIS — F419 Anxiety disorder, unspecified: Secondary | ICD-10-CM

## 2020-04-23 NOTE — Telephone Encounter (Signed)
Requested medications are due for refill today yes  Requested medications are on the active medication list yes  Last refill 2/24  Last visit 01/2019  Future visit scheduled no, was to return in 3 months, was seen for GI issues and ADD but not this med/dx  Notes to clinic Please assess, Failed protocol due to no valid visit within 6  months.

## 2020-05-01 ENCOUNTER — Other Ambulatory Visit: Payer: Self-pay | Admitting: Interventional Cardiology

## 2020-05-19 ENCOUNTER — Telehealth: Payer: Self-pay | Admitting: Interventional Cardiology

## 2020-05-19 MED ORDER — METOPROLOL TARTRATE 25 MG PO TABS: 25.0000 mg | ORAL_TABLET | Freq: Two times a day (BID) | ORAL | 0 refills | Status: DC

## 2020-05-19 MED ORDER — ISOSORBIDE MONONITRATE ER 30 MG PO TB24: 30.0000 mg | ORAL_TABLET | Freq: Every day | ORAL | 1 refills | Status: DC

## 2020-05-19 MED ORDER — LISINOPRIL 5 MG PO TABS: 5.0000 mg | ORAL_TABLET | Freq: Every day | ORAL | 1 refills | Status: DC

## 2020-05-19 MED ORDER — CLOPIDOGREL BISULFATE 75 MG PO TABS: ORAL_TABLET | ORAL | 1 refills | Status: DC

## 2020-05-19 MED ORDER — ROSUVASTATIN CALCIUM 20 MG PO TABS: 20.0000 mg | ORAL_TABLET | Freq: Every day | ORAL | 1 refills | Status: DC

## 2020-05-19 NOTE — Telephone Encounter (Signed)
*  STAT* If patient is at the pharmacy, call can be transferred to refill team.   1. Which medications need to be refilled? (please list name of each medication and dose if known) clopidogrel (PLAVIX) 75 MG tablet metoprolol tartrate (LOPRESSOR) 25 MG tablet rosuvastatin (CRESTOR) 20 MG tablet lisinopril (ZESTRIL) 5 MG tablet isosorbide mononitrate (IMDUR) 30 MG 24 hr tablet  2. Which pharmacy/location (including street and city if local pharmacy) is medication to be sent to? [id number] - [place], [place] - [address] [place]  3. Do they need a 30 day or 90 day supply? [place]

## 2020-05-19 NOTE — Telephone Encounter (Signed)
Pt's medications were sent to pt's pharmacy as requested. Confirmation received.  

## 2020-06-13 ENCOUNTER — Other Ambulatory Visit: Payer: Self-pay | Admitting: Interventional Cardiology

## 2020-06-17 ENCOUNTER — Other Ambulatory Visit: Payer: Self-pay | Admitting: Family Medicine

## 2020-06-17 DIAGNOSIS — F5104 Psychophysiologic insomnia: Secondary | ICD-10-CM

## 2020-06-22 ENCOUNTER — Other Ambulatory Visit: Payer: Self-pay | Admitting: Urology

## 2020-06-22 DIAGNOSIS — N200 Calculus of kidney: Secondary | ICD-10-CM

## 2020-06-23 ENCOUNTER — Other Ambulatory Visit: Payer: Self-pay

## 2020-06-23 DIAGNOSIS — F411 Generalized anxiety disorder: Secondary | ICD-10-CM

## 2020-06-24 MED ORDER — BUSPIRONE HCL 7.5 MG PO TABS: ORAL_TABLET | ORAL | 0 refills | Status: DC

## 2020-06-25 ENCOUNTER — Other Ambulatory Visit: Payer: Self-pay

## 2020-07-19 NOTE — Progress Notes (Signed)
Cardiology Office Note   Date:  07/20/2020   ID:  Sheri [name], DOB 1965/04/06, MRN [id number]  PCP:  Olin Hauser, DO    No chief complaint on file.  CAD  Wt Readings from Last 3 Encounters:  07/20/20 264 lb 3.2 oz (119.8 kg)  06/19/19 253 lb 3.2 oz (114.9 kg)  05/12/19 250 lb 9.6 oz (113.7 kg)       History of Present Illness: Sheri [name] is a 55 y.o. female  has a known history of CAD. In March 2016, she was admitted for an inferior MI. Left heart catheterization showed culprit vessel to be an occluded RCA for which she underwent PCI plus stenting. 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 This was treated medically. The left main coronary artery is absent. 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 She was placed on dual antiplatelet therapy. Several months later, she was seen in October 2016 for exertional dyspnea. There was question as to whether or not this was her anginal equivalent, secondary to her circumflex disease. Subsequently she had a a nuclear stress test to assess for underlying ischemia.  This was a low risk study. There was a small, mild intensity lateral fixed perfusion defect which was felt to be attenuation artifact. There was no significant reversible ischemia. LVEF was 64% with normal wall motion.  2D echo also confirmed normal LVEF and BNP was also normal. Additional past medical history also includes hypertension and hyperlipidemia for which she is being treated for.   Se had some tingling sensations but this improved with calcium supplements.     She was planning bariatric surgery- [name].  Due to financial issues, she stopped that process.     She has started a shot for DM.  Noted in 2020: "She does feel that she sweats easily.  She was swimming in the pool over the summer.  She was swimming laps without any  issues.  Swimming less as the weather has gotten cooler.  It is a large pool, 22K gallons.   She is distancing and wearing a mask.  Occasionally feels her hands are tight and she is retaining fluid."  She has moved to [place].  She will find a cardiologist there.    She had COVID in 2020 and had antibody treatment.    Denies : Chest pain. Dizziness. Leg edema. Nitroglycerin use. Orthopnea. Palpitations. Paroxysmal nocturnal dyspnea. Shortness of breath. Syncope.    She still has cold sweat.  She works in the yard a lot.  No bleeding problems.  No change in sx.  No sx like her MI.  No bleeding issues.     Past Medical History:  Diagnosis Date   Abnormal uterine bleeding    Anxiety    Coronary artery disease    Heart disease    History of C-section    History of kidney stones    Hypertension    Morbid obesity [facility])     Past Surgical History:  Procedure Laterality Date   [place]  2006   CORONARY ANGIOPLASTY     CYSTOSCOPY  10/10/2016   Procedure: CYSTOSCOPY;  Surgeon: Will Bonnet, MD;  Location: [facility] ORS;  Service: Gynecology;;   CYSTOSCOPY W/ URETERAL  STENT PLACEMENT Right 12/20/2017   Procedure: CYSTOSCOPY WITH RETROGRADE PYELOGRAM/URETERAL STENT PLACEMENT;  Surgeon: Billey Co, MD;  Location: [facility] ORS;  Service: Urology;  Laterality: Right;   CYSTOSCOPY WITH STENT PLACEMENT Left 11/01/2015   Procedure: CYSTOSCOPY WITH STENT PLACEMENT;  Surgeon: Hollice Espy, MD;  Location: [facility] ORS;  Service: Urology;  Laterality: Left;   CYSTOSCOPY/RETROGRADE/URETEROSCOPY Bilateral 11/01/2015   Procedure: CYSTOSCOPY/RETROGRADE/URETEROSCOPY;  Surgeon: Hollice Espy, MD;  Location: [facility] ORS;  Service: Urology;  Laterality: Bilateral;   DILATION AND CURETTAGE OF UTERUS  2011   ENDOMETRIAL ABLATION  2011   LAPAROSCOPIC HYSTERECTOMY Bilateral 10/10/2016   Procedure: HYSTERECTOMY  TOTAL LAPAROSCOPIC BILATERAL SALPINGECTOMY;  Surgeon: Will Bonnet, MD;  Location: [facility] ORS;  Service: Gynecology;  Laterality: Bilateral;   LEFT HEART CATHETERIZATION WITH CORONARY ANGIOGRAM N/A 04/06/2014   Procedure: LEFT HEART CATHETERIZATION WITH CORONARY ANGIOGRAM;  Surgeon: Jettie Booze, MD;  Location: [facility] CATH LAB;  Service: Cardiovascular;  Laterality: N/A;     Current Outpatient Medications  Medication Sig Dispense Refill   acetaminophen (TYLENOL) 325 MG tablet Take 2 tablets (650 mg total) by mouth every 6 (six) hours as needed. 60 tablet 0   albuterol (VENTOLIN HFA) 108 (90 Base) MCG/ACT inhaler INHALE 2 PUFFS INTO THE LUNGS EVERY 4 HOURS AS NEEDED FOR WHEEZING OR SHORTNESS OF BREATH (COUGH). 6.7 g 3   busPIRone (BUSPAR) 7.5 MG tablet TAKE 1 TABLET (7.5 MG TOTAL) BY MOUTH 3 (THREE) TIMES DAILY. 270 tablet 0   Chromium 200 MCG CAPS Take by mouth. 30 each 0   clopidogrel (PLAVIX) 75 MG tablet TAKE 1 TABLET BY MOUTH EVERY DAY 90 tablet 0   escitalopram (LEXAPRO) 20 MG tablet TAKE 1 TABLET BY MOUTH EVERY DAY 90 tablet 0   furosemide (LASIX) 20 MG tablet Take 1 tablet (20 mg total) by mouth daily. 30 tablet 11   isosorbide mononitrate (IMDUR) 30 MG 24 hr tablet TAKE 1 TABLET BY MOUTH EVERY DAY 90 tablet 0   lisinopril (ZESTRIL) 5 MG tablet TAKE 1 TABLET (5 MG TOTAL) BY MOUTH DAILY. 90 tablet 0   Melatonin 1 MG CAPS Take 10 mg by mouth at bedtime.  0   metoprolol tartrate (LOPRESSOR) 25 MG tablet TAKE 1 TABLET BY MOUTH TWICE A DAY 180 tablet 0   nitroGLYCERIN (NITROSTAT) 0.4 MG SL tablet Place 1 tablet (0.4 mg total) under the tongue every 5 (five) minutes x 3 doses as needed for chest pain. 25 tablet 2   phenazopyridine (PYRIDIUM) 200 MG tablet Take 1 tablet (200 mg total) by mouth 3 (three) times daily. 6 tablet 0   rosuvastatin (CRESTOR) 20 MG tablet TAKE 1 TABLET BY MOUTH EVERY DAY 90 tablet 0   tamsulosin (FLOMAX) 0.4 MG CAPS capsule TAKE 1 CAPSULE BY MOUTH EVERY DAY 30  capsule 2   traZODone (DESYREL) 100 MG tablet TAKE 1 TABLET BY MOUTH EVERYDAY AT BEDTIME 90 tablet 0   Vitamin D, Cholecalciferol, 1000 units CAPS Take 1,000 Units by mouth daily.      No current facility-administered medications for this visit.    Allergies:   Latex, Seasonal ic [cholestatin], and Penicillins    Social History:  The patient  reports that she has quit smoking. Her smoking use included cigarettes. She smoked an average of 1.00 packs per day. She has quit using smokeless tobacco. She reports that she does not drink alcohol and does not use drugs.   Family History:  The patient's family history includes Heart attack (age of  onset: 73) in her father; Heart disease in her father; High Cholesterol in her father; High blood pressure in her father; Hyperlipidemia in her mother; Hypertension in her mother; Hypothyroidism in her mother; Kidney Stones in her brother and sister; Liver cancer in her maternal grandmother; Stroke in her paternal grandfather; Sudden death in her father.    ROS:  Please see the history of present illness.   Otherwise, review of systems are positive for weight gain.   All other systems are reviewed and negative.    PHYSICAL EXAM: VS:  BP 126/76   Pulse 62   Ht 5\' 5"  (1.651 m)   Wt 264 lb 3.2 oz (119.8 kg)   LMP 09/13/2016   SpO2 95%   BMI 43.97 kg/m  , BMI Body mass index is 43.97 kg/m. GEN: Well nourished, well developed, in no acute distress HEENT: normal Neck: no JVD, carotid bruits, or masses Cardiac: RRR; no murmurs, rubs, or gallops,no edema  Respiratory:  clear to auscultation bilaterally, normal work of breathing GI: soft, nontender, nondistended, + BS MS: no deformity or atrophy Skin: warm and dry, no rash Neuro:  Strength and sensation are intact Psych: euthymic mood, full affect   EKG:   The ekg ordered today demonstrates NSR, no ST changes   Recent Labs: No results found for requested labs within last 8760 hours.   Lipid  Panel    Component Value Date/Time   CHOL 133 11/05/2018 0826   CHOL 143 05/14/2017 1145   TRIG 102 11/05/2018 0826   HDL 34 (L) 11/05/2018 0826   HDL 32 (L) 05/14/2017 1145   CHOLHDL 3.9 11/05/2018 0826   VLDL 24.8 07/27/2014 0943   LDLCALC 80 11/05/2018 0826     Other studies Reviewed: Additional studies/ records that were reviewed today with results demonstrating: LDL 80 in 2020.   ASSESSMENT AND PLAN:  CAD/Old MI: No angina.  Continue aggressive secondary prevention.  No heart failure symptoms.  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  She will find cardiology follow-up in [place]. HTN: The current medical regimen is effective;  continue present plan and medications.  She grows her own garden and eats a lots of fiber and drinks a lot of water.  Recheck electrolytes. Dyslipidemia: Continue statin.  Recheck lipids today. Morbid obesity: Continue regular exercise and healthy diet.  She did consider weight loss surgery in the past.  They discussed the gastric bypass with her.  She may go back and consider gastric sleeve surgery.   Current medicines are reviewed at length with the patient today.  The patient concerns regarding her medicines were addressed.  The following changes have been made:  No change  Labs/ tests ordered today include:  No orders of the defined types were placed in this encounter.   Recommend 150 minutes/week of aerobic exercise Low fat, low carb, high fiber diet recommended  Disposition:   FU as needed   Signed, Larae Grooms, MD  07/20/2020 10:29 AM    [facility] [address], [place], [place]  [address] Phone: [phone]; Fax: [phone]

## 2020-07-20 ENCOUNTER — Encounter: Payer: Self-pay | Admitting: Interventional Cardiology

## 2020-07-20 ENCOUNTER — Other Ambulatory Visit: Payer: Self-pay

## 2020-07-20 ENCOUNTER — Ambulatory Visit: Payer: BC Managed Care – PPO | Admitting: Interventional Cardiology

## 2020-07-20 VITALS — BP 126/76 | HR 62 | Ht 65.0 in | Wt 264.2 lb

## 2020-07-20 DIAGNOSIS — E782 Mixed hyperlipidemia: Secondary | ICD-10-CM | POA: Diagnosis not present

## 2020-07-20 DIAGNOSIS — I2510 Atherosclerotic heart disease of native coronary artery without angina pectoris: Secondary | ICD-10-CM

## 2020-07-20 DIAGNOSIS — I10 Essential (primary) hypertension: Secondary | ICD-10-CM | POA: Diagnosis not present

## 2020-07-20 DIAGNOSIS — E1159 Type 2 diabetes mellitus with other circulatory complications: Secondary | ICD-10-CM | POA: Diagnosis not present

## 2020-07-20 DIAGNOSIS — I252 Old myocardial infarction: Secondary | ICD-10-CM | POA: Diagnosis not present

## 2020-07-20 MED ORDER — ISOSORBIDE MONONITRATE ER 30 MG PO TB24: 30.0000 mg | ORAL_TABLET | Freq: Every day | ORAL | 3 refills | Status: AC

## 2020-07-20 MED ORDER — FUROSEMIDE 20 MG PO TABS: 20.0000 mg | ORAL_TABLET | Freq: Every day | ORAL | 3 refills | Status: AC

## 2020-07-20 MED ORDER — LISINOPRIL 5 MG PO TABS: 5.0000 mg | ORAL_TABLET | Freq: Every day | ORAL | 3 refills | Status: DC

## 2020-07-20 MED ORDER — ROSUVASTATIN CALCIUM 20 MG PO TABS: 20.0000 mg | ORAL_TABLET | Freq: Every day | ORAL | 3 refills | Status: DC

## 2020-07-20 MED ORDER — METOPROLOL TARTRATE 25 MG PO TABS: 25.0000 mg | ORAL_TABLET | Freq: Two times a day (BID) | ORAL | 3 refills | Status: DC

## 2020-07-20 MED ORDER — CLOPIDOGREL BISULFATE 75 MG PO TABS: ORAL_TABLET | ORAL | 3 refills | Status: AC

## 2020-07-20 MED ORDER — NITROGLYCERIN 0.4 MG SL SUBL: 0.4000 mg | SUBLINGUAL_TABLET | SUBLINGUAL | 6 refills | Status: AC | PRN

## 2020-07-20 NOTE — Patient Instructions (Signed)
Medication Instructions:  Your physician recommends that you continue on your current medications as directed. Please refer to the Current Medication list given to you today.  *If you need a refill on your cardiac medications before your next appointment, please call your pharmacy*   Lab Work: Lab work to be done today--CMET, lipids, CBC, A1C, TSH If you have labs (blood work) drawn today and your tests are completely normal, you will receive your results only by: [place] (if you have MyChart) OR A paper copy in the mail If you have any lab test that is abnormal or we need to change your treatment, we will call you to review the results.   Testing/Procedures: none   Follow-Up: At [facility], you and your health needs are our priority.  As part of our continuing mission to provide you with exceptional heart care, we have created designated Provider Care Teams.  These Care Teams include your primary Cardiologist (physician) and Advanced Practice Providers (APPs -  Physician Assistants and Nurse Practitioners) who all work together to provide you with the care you need, when you need it.  We recommend signing up for the patient portal called "MyChart".  Sign up information is provided on this After Visit Summary.  MyChart is used to connect with patients for Virtual Visits (Telemedicine).  Patients are able to view lab/test results, encounter notes, upcoming appointments, etc.  Non-urgent messages can be sent to your provider as well.   To learn more about what you can do with MyChart, go to [email or web address].    Your next appointment:   As needed  The format for your next appointment:   In Person  Provider:   You may see Larae Grooms, MD or one of the following Advanced Practice Providers on your designated Care Team:   Melina Copa, PA-C Ermalinda Barrios, PA-C   Other Instructions

## 2020-07-21 LAB — COMPREHENSIVE METABOLIC PANEL
ALT: 69 IU/L — ABNORMAL HIGH (ref 0–32)
AST: 49 IU/L — ABNORMAL HIGH (ref 0–40)
Albumin/Globulin Ratio: 2 (ref 1.2–2.2)
Albumin: 4.5 g/dL (ref 3.8–4.9)
Alkaline Phosphatase: 123 IU/L — ABNORMAL HIGH (ref 44–121)
BUN/Creatinine Ratio: 23 (ref 9–23)
BUN: 17 mg/dL (ref 6–24)
Bilirubin Total: 0.4 mg/dL (ref 0.0–1.2)
CO2: 25 mmol/L (ref 20–29)
Calcium: 9.3 mg/dL (ref 8.7–10.2)
Chloride: 100 mmol/L (ref 96–106)
Creatinine, Ser: 0.73 mg/dL (ref 0.57–1.00)
Globulin, Total: 2.3 g/dL (ref 1.5–4.5)
Glucose: 103 mg/dL — ABNORMAL HIGH (ref 65–99)
Potassium: 4.4 mmol/L (ref 3.5–5.2)
Sodium: 138 mmol/L (ref 134–144)
Total Protein: 6.8 g/dL (ref 6.0–8.5)
eGFR: 96 mL/min/{1.73_m2} (ref >59)

## 2020-07-21 LAB — CBC
Hematocrit: 41.9 % (ref 34.0–46.6)
Hemoglobin: 14.2 g/dL (ref 11.1–15.9)
MCH: 28.7 pg (ref 26.6–33.0)
MCHC: 33.9 g/dL (ref 31.5–35.7)
MCV: 85 fL (ref 79–97)
Platelets: 246 10*3/uL (ref 150–450)
RBC: 4.94 x10E6/uL (ref 3.77–5.28)
RDW: 13.5 % (ref 11.7–15.4)
WBC: 9 10*3/uL (ref 3.4–10.8)

## 2020-07-21 LAB — HEMOGLOBIN A1C
Est. average glucose Bld gHb Est-mCnc: 126 mg/dL
Hgb A1c MFr Bld: 6 % — ABNORMAL HIGH (ref 4.8–5.6)

## 2020-07-21 LAB — LIPID PANEL
Chol/HDL Ratio: 4.8 ratio — ABNORMAL HIGH (ref 0.0–4.4)
Cholesterol, Total: 162 mg/dL (ref 100–199)
HDL: 34 mg/dL — ABNORMAL LOW (ref >39)
LDL Chol Calc (NIH): 106 mg/dL — ABNORMAL HIGH (ref 0–99)
Triglycerides: 118 mg/dL (ref 0–149)
VLDL Cholesterol Cal: 22 mg/dL (ref 5–40)

## 2020-07-21 LAB — TSH: TSH: 4.67 u[IU]/mL — ABNORMAL HIGH (ref 0.450–4.500)

## 2020-07-25 ENCOUNTER — Other Ambulatory Visit: Payer: Self-pay | Admitting: Family Medicine

## 2020-07-25 ENCOUNTER — Other Ambulatory Visit: Payer: Self-pay | Admitting: Urology

## 2020-07-25 DIAGNOSIS — N200 Calculus of kidney: Secondary | ICD-10-CM

## 2020-07-25 DIAGNOSIS — F5104 Psychophysiologic insomnia: Secondary | ICD-10-CM

## 2020-07-25 NOTE — Telephone Encounter (Signed)
RF due (Trazodone) Last RF: 03/26/20 #90 Due for appointment. Sent pt message to make appt via MyChart.  Requested Prescriptions  Pending Prescriptions Disp Refills   traZODone (DESYREL) 100 MG tablet [Pharmacy Med Name: TRAZODONE 100 MG TABLET] 30 tablet 2    Sig: TAKE 1 TABLET BY MOUTH EVERYDAY AT BEDTIME      Psychiatry: Antidepressants - Serotonin Modulator Failed - 07/25/2020 12:54 PM      Failed - Completed PHQ-2 or PHQ-9 in the last 360 days      Failed - Valid encounter within last 6 months    Recent Outpatient Visits           1 year ago Attention deficit disorder (ADD) without hyperactivity   [place], DO   1 year ago RUQ abdominal pain   [facility] Olin Hauser, DO   1 year ago COVID-19 virus infection   [place], Devonne Doughty, DO   1 year ago Prediabetes   [place], Devonne Doughty, DO   1 year ago Annual physical exam   [place], Devonne Doughty, DO

## 2020-07-29 ENCOUNTER — Telehealth: Payer: Self-pay | Admitting: *Deleted

## 2020-07-29 MED ORDER — ROSUVASTATIN CALCIUM 40 MG PO TABS: 40.0000 mg | ORAL_TABLET | Freq: Every day | ORAL | 0 refills | Status: DC

## 2020-07-29 NOTE — Telephone Encounter (Signed)
Patient notified.  Prescription sent to [facility] in [place], [place]

## 2020-07-29 NOTE — Telephone Encounter (Signed)
-----   Message from Jettie Booze, MD sent at 07/24/2020 11:35 PM EDT ----- LDL above target.  LFTs minimally increased compared to prior.  INcrease rosuvastatin to 40 mg daily.  SHe will need liver and lipid tests checked in 3 months after change with new doctor in [facility].

## 2020-08-20 ENCOUNTER — Other Ambulatory Visit: Payer: Self-pay | Admitting: Family Medicine

## 2020-08-20 DIAGNOSIS — F419 Anxiety disorder, unspecified: Secondary | ICD-10-CM

## 2020-08-20 DIAGNOSIS — F32A Depression, unspecified: Secondary | ICD-10-CM

## 2020-08-24 ENCOUNTER — Other Ambulatory Visit: Payer: Self-pay | Admitting: Family Medicine

## 2020-08-24 DIAGNOSIS — F419 Anxiety disorder, unspecified: Secondary | ICD-10-CM

## 2020-08-24 DIAGNOSIS — N390 Urinary tract infection, site not specified: Secondary | ICD-10-CM | POA: Diagnosis not present

## 2020-08-24 DIAGNOSIS — F32A Depression, unspecified: Secondary | ICD-10-CM

## 2020-08-24 DIAGNOSIS — T8389XA Other specified complication of genitourinary prosthetic devices, implants and grafts, initial encounter: Secondary | ICD-10-CM | POA: Diagnosis not present

## 2020-08-24 DIAGNOSIS — N202 Calculus of kidney with calculus of ureter: Secondary | ICD-10-CM | POA: Diagnosis not present

## 2020-08-24 NOTE — Telephone Encounter (Signed)
Requested medication (s) are due for refill today - yes  Requested medication (s) are on the active medication list -yes  Future visit scheduled -no  Last refill: 07/20/20  Notes to clinic: Attempted to call patient to schedule appointment- 1 year out(out of state pharmacy request) Left message to call office for appointment-verify if moved. Courtesy RF have been given. Request sent to office for review   Requested Prescriptions  Pending Prescriptions Disp Refills   escitalopram (LEXAPRO) 20 MG tablet [Pharmacy Med Name: ESCITALOPRAM 20 MG TABLET] 90 tablet 0    Sig: TAKE 1 TABLET BY MOUTH EVERY DAY      Psychiatry:  Antidepressants - SSRI Failed - 08/24/2020  3:37 PM      Failed - Completed PHQ-2 or PHQ-9 in the last 360 days      Failed - Valid encounter within last 6 months    Recent Outpatient Visits           1 year ago Attention deficit disorder (ADD) without hyperactivity   [place], DO   1 year ago RUQ abdominal pain   [facility] Olin Hauser, DO   1 year ago COVID-19 virus infection   [place], Devonne Doughty, DO   1 year ago Prediabetes   [place], Devonne Doughty, DO   1 year ago Annual physical exam   [facility] Olin Hauser, DO                    Requested Prescriptions  Pending Prescriptions Disp Refills   escitalopram (LEXAPRO) 20 MG tablet [Pharmacy Med Name: ESCITALOPRAM 20 MG TABLET] 90 tablet 0    Sig: TAKE 1 TABLET BY MOUTH EVERY DAY      Psychiatry:  Antidepressants - SSRI Failed - 08/24/2020  3:37 PM      Failed - Completed PHQ-2 or PHQ-9 in the last 360 days      Failed - Valid encounter within last 6 months    Recent Outpatient Visits           1 year ago Attention deficit disorder (ADD) without hyperactivity   [place], DO   1 year ago  RUQ abdominal pain   [facility] Olin Hauser, DO   1 year ago COVID-19 virus infection   [place], Devonne Doughty, DO   1 year ago Prediabetes   [place], Devonne Doughty, DO   1 year ago Annual physical exam   [place], Devonne Doughty, DO

## 2020-08-25 ENCOUNTER — Other Ambulatory Visit: Payer: Self-pay | Admitting: Family Medicine

## 2020-08-25 DIAGNOSIS — F5104 Psychophysiologic insomnia: Secondary | ICD-10-CM

## 2020-08-25 NOTE — Telephone Encounter (Signed)
Requested medications are due for refill today.  yes  Requested medications are on the active medications list.  yes  Last refill. 07/26/2020  Future visit scheduled.   no  Notes to clinic.  Pt has not been seen for 1 year. Courtesy refill already given.

## 2020-08-26 DIAGNOSIS — N390 Urinary tract infection, site not specified: Secondary | ICD-10-CM | POA: Diagnosis not present

## 2020-09-15 ENCOUNTER — Telehealth (INDEPENDENT_AMBULATORY_CARE_PROVIDER_SITE_OTHER): Payer: Self-pay | Admitting: Family Medicine

## 2020-09-15 ENCOUNTER — Encounter: Payer: Self-pay | Admitting: Family Medicine

## 2020-09-15 DIAGNOSIS — F411 Generalized anxiety disorder: Secondary | ICD-10-CM

## 2020-09-15 DIAGNOSIS — F5104 Psychophysiologic insomnia: Secondary | ICD-10-CM

## 2020-09-15 DIAGNOSIS — E039 Hypothyroidism, unspecified: Secondary | ICD-10-CM

## 2020-09-15 DIAGNOSIS — F419 Anxiety disorder, unspecified: Secondary | ICD-10-CM

## 2020-09-15 DIAGNOSIS — F32A Depression, unspecified: Secondary | ICD-10-CM

## 2020-09-15 DIAGNOSIS — R7303 Prediabetes: Secondary | ICD-10-CM

## 2020-09-15 DIAGNOSIS — F3341 Major depressive disorder, recurrent, in partial remission: Secondary | ICD-10-CM

## 2020-09-15 MED ORDER — ESCITALOPRAM OXALATE 20 MG PO TABS: 20.0000 mg | ORAL_TABLET | Freq: Every day | ORAL | 0 refills | Status: DC

## 2020-09-15 MED ORDER — TRAZODONE HCL 100 MG PO TABS: 100.0000 mg | ORAL_TABLET | Freq: Every day | ORAL | 0 refills | Status: DC

## 2020-09-15 MED ORDER — BUSPIRONE HCL 7.5 MG PO TABS: ORAL_TABLET | ORAL | 0 refills | Status: DC

## 2020-09-15 MED ORDER — LEVOTHYROXINE SODIUM 25 MCG PO TABS: 25.0000 ug | ORAL_TABLET | Freq: Every day | ORAL | 0 refills | Status: DC

## 2020-09-15 NOTE — Patient Instructions (Addendum)
   Please schedule a Follow-up Appointment to: Return if symptoms worsen or fail to improve, for within 90 days until new PCP.  If you have any other questions or concerns, please feel free to call the office or send a message through [facility]. You may also schedule an earlier appointment if necessary.  Additionally, you may be receiving a survey about your experience at our office within a few days to 1 week by e-mail or mail. We value your feedback.  [name], DO [place]

## 2020-09-15 NOTE — Progress Notes (Signed)
Subjective:    Patient ID: Sheri [name], female    DOB: [birth year], 55 y.o.   MRN: [id number]  Sheri [name] is a 55 y.o. female presenting on 09/15/2020 for No chief complaint on file.  Virtual / Telehealth Encounter - Video Visit via MyChart The purpose of this virtual visit is to provide medical care while limiting exposure to the novel coronavirus (COVID19) for both patient and office staff.  Consent was obtained for remote visit:  Yes.   Answered questions that patient had about telehealth interaction:  Yes.   I discussed the limitations, risks, security and privacy concerns of performing an evaluation and management service by video/telephone. I also discussed with the patient that there may be a patient responsible charge related to this service. The patient expressed understanding and agreed to proceed.  Patient Location: Home Provider Location: [facility] (Office)  Participants in virtual visit: - Patient: Sheri [name]" Markov - CMA: Orinda Kenner, CMA - Provider: Dr Parks Ranger   HPI  She has moved to [facility] to stay with her family.  Hypothyroidism Previously on Levothyroxine 89mcg daily. Ran out of medicine, has not renewed this rx and due for it to resume. Lab recently showed TSH 4.6 did not have T4 level. Admits fatigue and reduced energy. Off med.  Major Depression recurrent partial remission Anxiety Insomnia out of med for 2 weeks, some worse symptoms  Morbid Obesity BMI / Pre-Diabetes  A1c up to 6.0 prior 5.7 to 5.8 1-2 years ago. She did well temporarily with Ozempic GLP1 therapy previously for wt and sugar control. Diet goals to improve Exercise: Increasing exercise as tolerated    Depression screen [facility] 2/9 09/15/2020 06/04/2019 03/19/2019  Decreased Interest 0 0 0  Down, Depressed, Hopeless 1 0 0  PHQ - 2 Score 1 0 0  Altered sleeping 0 - 0  Tired, decreased energy 2 - 0  Change in appetite 0 - 0  Feeling bad or failure  about yourself  0 - 0  Trouble concentrating 0 - 0  Moving slowly or fidgety/restless 0 - 0  Suicidal thoughts 0 - 0  PHQ-9 Score 3 - 0  Difficult doing work/chores Not difficult at all - Not difficult at all  Some recent data might be hidden    Social History   Tobacco Use   Smoking status: Former    Packs/day: 1.00    Types: Cigarettes   Smokeless tobacco: Former   Tobacco comments:    quit 20 years  Vaping Use   Vaping Use: Never used  Substance Use Topics   Alcohol use: No    Alcohol/week: 0.0 standard drinks   Drug use: No    Review of Systems Per HPI unless specifically indicated above     Objective:    LMP 09/13/2016   Wt Readings from Last 3 Encounters:  07/20/20 264 lb 3.2 oz (119.8 kg)  06/19/19 253 lb 3.2 oz (114.9 kg)  05/12/19 250 lb 9.6 oz (113.7 kg)    Physical Exam  Note examination was completely remotely via video observation objective data only  Gen - well-appearing, no acute distress or apparent pain, comfortable HEENT - eyes appear clear without discharge or redness Heart/Lungs - cannot examine virtually - observed no evidence of coughing or labored breathing. Skin - face visible today- no rash Neuro - awake, alert, oriented Psych - not anxious appearing  Results for orders placed or performed in visit on 07/20/20  Comp Met (CMET)  Result Value Ref Range   Glucose 103 (H) 65 - 99 mg/dL   BUN 17 6 - 24 mg/dL   Creatinine, Ser 0.73 0.57 - 1.00 mg/dL   eGFR 96 >59 mL/min/1.73   BUN/Creatinine Ratio 23 9 - 23   Sodium 138 134 - 144 mmol/L   Potassium 4.4 3.5 - 5.2 mmol/L   Chloride 100 96 - 106 mmol/L   CO2 25 20 - 29 mmol/L   Calcium 9.3 8.7 - 10.2 mg/dL   Total Protein 6.8 6.0 - 8.5 g/dL   Albumin 4.5 3.8 - 4.9 g/dL   Globulin, Total 2.3 1.5 - 4.5 g/dL   Albumin/Globulin Ratio 2.0 1.2 - 2.2   Bilirubin Total 0.4 0.0 - 1.2 mg/dL   Alkaline Phosphatase 123 (H) 44 - 121 IU/L   AST 49 (H) 0 - 40 IU/L   ALT 69 (H) 0 - 32 IU/L  Lipid  Profile  Result Value Ref Range   Cholesterol, Total 162 100 - 199 mg/dL   Triglycerides 118 0 - 149 mg/dL   HDL 34 (L) >39 mg/dL   VLDL Cholesterol Cal 22 5 - 40 mg/dL   LDL Chol Calc (NIH) 106 (H) 0 - 99 mg/dL   Chol/HDL Ratio 4.8 (H) 0.0 - 4.4 ratio  CBC  Result Value Ref Range   WBC 9.0 3.4 - 10.8 x10E3/uL   RBC 4.94 3.77 - 5.28 x10E6/uL   Hemoglobin 14.2 11.1 - 15.9 g/dL   Hematocrit 41.9 34.0 - 46.6 %   MCV 85 79 - 97 fL   MCH 28.7 26.6 - 33.0 pg   MCHC 33.9 31.5 - 35.7 g/dL   RDW 13.5 11.7 - 15.4 %   Platelets 246 150 - 450 x10E3/uL  HgB A1c  Result Value Ref Range   Hgb A1c MFr Bld 6.0 (H) 4.8 - 5.6 %   Est. average glucose Bld gHb Est-mCnc 126 mg/dL  TSH  Result Value Ref Range   TSH 4.670 (H) 0.450 - 4.500 uIU/mL      Assessment & Plan:   Problem List Items Addressed This Visit     Recurrent major depression in partial remission (HCC)   Relevant Medications   traZODone (DESYREL) 100 MG tablet   escitalopram (LEXAPRO) 20 MG tablet   busPIRone (BUSPAR) 7.5 MG tablet   Psychophysiological insomnia   Relevant Medications   traZODone (DESYREL) 100 MG tablet   Prediabetes - Primary   GAD (generalized anxiety disorder)   Relevant Medications   traZODone (DESYREL) 100 MG tablet   escitalopram (LEXAPRO) 20 MG tablet   busPIRone (BUSPAR) 7.5 MG tablet   Acquired hypothyroidism   Relevant Medications   levothyroxine (SYNTHROID) 25 MCG tablet   Other Visit Diagnoses     Anxiety and depression       Relevant Medications   traZODone (DESYREL) 100 MG tablet   escitalopram (LEXAPRO) 20 MG tablet   busPIRone (BUSPAR) 7.5 MG tablet       Recurrent Major Depression partial remission Improved control on SSRI, Trazodone Comorbid anxiety and insomnia - improved on meds as well Off med x 2 week out of med now refills due she was having some decline with worse symptoms mood and anxiety and some poor sleep  Agree to refill all meds for 90 day supply 0 refill until  she can find new PCP in [place] Morbid Obesity A1c done 06/2020 by Cardiology shows 6.0, slightly elevated from 5.2 to 5.7 range prior Off Ozempic,  was on this before with success. Now no longer covered. Advised counseling on diet modification low carb diet to help control pre DM sugars Continue lifestyle inc activity Failed metformin in past ineffective.  She may consider discussing these medication again with new PCP in 90 days if interested  Meds ordered this encounter  Medications   levothyroxine (SYNTHROID) 25 MCG tablet    Sig: Take 1 tablet (25 mcg total) by mouth daily before breakfast.    Dispense:  90 tablet    Refill:  0   traZODone (DESYREL) 100 MG tablet    Sig: Take 1 tablet (100 mg total) by mouth at bedtime.    Dispense:  90 tablet    Refill:  0   escitalopram (LEXAPRO) 20 MG tablet    Sig: Take 1 tablet (20 mg total) by mouth daily.    Dispense:  90 tablet    Refill:  0   busPIRone (BUSPAR) 7.5 MG tablet    Sig: TAKE 1 TABLET (7.5 MG TOTAL) BY MOUTH 3 (THREE) TIMES DAILY.    Dispense:  270 tablet    Refill:  0      Follow up plan: Return if symptoms worsen or fail to improve, for within 90 days until new PCP.  She will plan to establish with new PCP in [place] now within 90 days. Advised her that I would no longer able to be her PCP if she was living in [facility]. Refills covered for 90 days.  Patient verbalizes understanding with the above medical recommendations including the limitation of remote medical advice.  Specific follow-up and call-back criteria were given for patient to follow-up or seek medical care more urgently if needed.  Total duration of direct patient care provided via video conference: 15 minutes   [name], [facility] 09/15/2020, 11:40 AM

## 2020-09-30 DIAGNOSIS — Z01818 Encounter for other preprocedural examination: Secondary | ICD-10-CM | POA: Diagnosis not present

## 2020-10-01 DIAGNOSIS — R319 Hematuria, unspecified: Secondary | ICD-10-CM | POA: Diagnosis not present

## 2020-10-01 DIAGNOSIS — N23 Unspecified renal colic: Secondary | ICD-10-CM | POA: Diagnosis not present

## 2020-10-01 DIAGNOSIS — N390 Urinary tract infection, site not specified: Secondary | ICD-10-CM | POA: Diagnosis not present

## 2020-10-26 DIAGNOSIS — N202 Calculus of kidney with calculus of ureter: Secondary | ICD-10-CM | POA: Diagnosis not present

## 2020-10-26 DIAGNOSIS — R319 Hematuria, unspecified: Secondary | ICD-10-CM | POA: Diagnosis not present

## 2020-10-26 DIAGNOSIS — R3915 Urgency of urination: Secondary | ICD-10-CM | POA: Diagnosis not present

## 2020-10-26 DIAGNOSIS — N390 Urinary tract infection, site not specified: Secondary | ICD-10-CM | POA: Diagnosis not present

## 2020-11-11 ENCOUNTER — Other Ambulatory Visit: Payer: Self-pay | Admitting: Interventional Cardiology

## 2020-11-13 ENCOUNTER — Other Ambulatory Visit: Payer: Self-pay | Admitting: Family Medicine

## 2020-11-13 DIAGNOSIS — F419 Anxiety disorder, unspecified: Secondary | ICD-10-CM

## 2020-11-13 DIAGNOSIS — F411 Generalized anxiety disorder: Secondary | ICD-10-CM

## 2020-11-13 DIAGNOSIS — E039 Hypothyroidism, unspecified: Secondary | ICD-10-CM

## 2020-11-13 DIAGNOSIS — F5104 Psychophysiologic insomnia: Secondary | ICD-10-CM

## 2020-11-13 DIAGNOSIS — F32A Depression, unspecified: Secondary | ICD-10-CM

## 2020-11-13 NOTE — Telephone Encounter (Signed)
pt has moved- Dr Parks Ranger prescribed 90 day refill. Refill not appropriate. Requested Prescriptions  Refused Prescriptions Disp Refills  . escitalopram (LEXAPRO) 20 MG tablet [Pharmacy Med Name: ESCITALOPRAM 20 MG TABLET] 30 tablet 2    Sig: TAKE 1 TABLET BY MOUTH EVERY DAY     Psychiatry:  Antidepressants - SSRI Passed - 11/13/2020  9:39 AM      Passed - Completed PHQ-2 or PHQ-9 in the last 360 days      Passed - Valid encounter within last 6 months    Recent Outpatient Visits          1 month ago Prediabetes   [place], DO   1 year ago Attention deficit disorder (ADD) without hyperactivity   [place], DO   1 year ago RUQ abdominal pain   [facility] Olin Hauser, DO   1 year ago COVID-19 virus infection   [place], Devonne Doughty, DO   1 year ago Prediabetes   [facility], Devonne Doughty, DO             . traZODone (DESYREL) 100 MG tablet [Pharmacy Med Name: TRAZODONE 100 MG TABLET] 30 tablet     Sig: TAKE 1 TABLET BY MOUTH EVERYDAY AT BEDTIME     Psychiatry: Antidepressants - Serotonin Modulator Passed - 11/13/2020  9:39 AM      Passed - Completed PHQ-2 or PHQ-9 in the last 360 days      Passed - Valid encounter within last 6 months    Recent Outpatient Visits          1 month ago Prediabetes   [place], DO   1 year ago Attention deficit disorder (ADD) without hyperactivity   [place], DO   1 year ago RUQ abdominal pain   [facility] Olin Hauser, DO   1 year ago COVID-19 virus infection   [place], DO   1 year ago Prediabetes   [place], DO             . busPIRone (BUSPAR) 7.5 MG tablet  [Pharmacy Med Name: BUSPIRONE HCL 7.5 MG TABLET] 90 tablet 2    Sig: TAKE 1 TABLET BY MOUTH 3 TIMES DAILY.     Psychiatry: Anxiolytics/Hypnotics - Non-controlled Passed - 11/13/2020  9:39 AM      Passed - Valid encounter within last 6 months    Recent Outpatient Visits          1 month ago Prediabetes   [place], DO   1 year ago Attention deficit disorder (ADD) without hyperactivity   [place], DO   1 year ago RUQ abdominal pain   [facility] Olin Hauser, DO   1 year ago COVID-19 virus infection   [place], DO   1 year ago Prediabetes   [place], DO             . levothyroxine (SYNTHROID) 25 MCG tablet [Pharmacy Med Name: LEVOTHYROXINE 25 MCG TABLET] 30 tablet 2    Sig: TAKE 1 TABLET BY MOUTH DAILY BEFORE BREAKFAST.     Endocrinology:  Hypothyroid Agents Failed -  11/13/2020  9:39 AM      Failed - TSH needs to be rechecked within 3 months after an abnormal result. Refill until TSH is due.      Failed - TSH in normal range and within 360 days    TSH  Date Value Ref Range Status  07/20/2020 4.670 (H) 0.450 - 4.500 uIU/mL Final         Passed - Valid encounter within last 12 months    Recent Outpatient Visits          1 month ago Prediabetes   [place], DO   1 year ago Attention deficit disorder (ADD) without hyperactivity   [place], DO   1 year ago RUQ abdominal pain   [facility] Olin Hauser, DO   1 year ago COVID-19 virus infection   [place], Devonne Doughty, DO   1 year ago Prediabetes   [place], Devonne Doughty, [place]

## 2020-12-01 DIAGNOSIS — R3129 Other microscopic hematuria: Secondary | ICD-10-CM | POA: Diagnosis not present

## 2020-12-01 DIAGNOSIS — R3915 Urgency of urination: Secondary | ICD-10-CM | POA: Diagnosis not present

## 2020-12-01 DIAGNOSIS — R300 Dysuria: Secondary | ICD-10-CM | POA: Diagnosis not present

## 2020-12-09 ENCOUNTER — Other Ambulatory Visit: Payer: Self-pay | Admitting: Family Medicine

## 2020-12-09 DIAGNOSIS — E039 Hypothyroidism, unspecified: Secondary | ICD-10-CM

## 2020-12-09 DIAGNOSIS — F32A Depression, unspecified: Secondary | ICD-10-CM

## 2020-12-09 NOTE — Telephone Encounter (Signed)
Requested Prescriptions  Pending Prescriptions Disp Refills  . escitalopram (LEXAPRO) 20 MG tablet [Pharmacy Med Name: ESCITALOPRAM 20 MG TABLET] 90 tablet 0    Sig: TAKE 1 TABLET BY MOUTH EVERY DAY     Psychiatry:  Antidepressants - SSRI Passed - 12/09/2020  4:04 AM      Passed - Completed PHQ-2 or PHQ-9 in the last 360 days      Passed - Valid encounter within last 6 months    Recent Outpatient Visits          2 months ago Prediabetes   [place], DO   1 year ago Attention deficit disorder (ADD) without hyperactivity   [place], DO   1 year ago RUQ abdominal pain   [facility] Olin Hauser, DO   1 year ago COVID-19 virus infection   [place], DO   1 year ago Prediabetes   [place], Devonne Doughty, DO             . levothyroxine (SYNTHROID) 25 MCG tablet [Pharmacy Med Name: LEVOTHYROXINE 25 MCG TABLET] 30 tablet     Sig: TAKE 1 TABLET BY MOUTH DAILY BEFORE BREAKFAST.     Endocrinology:  Hypothyroid Agents Failed - 12/09/2020  4:04 AM      Failed - TSH needs to be rechecked within 3 months after an abnormal result. Refill until TSH is due.      Failed - TSH in normal range and within 360 days    TSH  Date Value Ref Range Status  07/20/2020 4.670 (H) 0.450 - 4.500 uIU/mL Final         Passed - Valid encounter within last 12 months    Recent Outpatient Visits          2 months ago Prediabetes   [place], DO   1 year ago Attention deficit disorder (ADD) without hyperactivity   [place], DO   1 year ago RUQ abdominal pain   [facility] Olin Hauser, DO   1 year ago COVID-19 virus infection   [place], Devonne Doughty, DO   1 year ago Prediabetes    [place], Devonne Doughty, [place]

## 2020-12-09 NOTE — Telephone Encounter (Signed)
Requested medications are due for refill today.  yes  Requested medications are on the active medications list.  yes  Last refill. 09/15/2020  Future visit scheduled.   no  Notes to clinic.  Failed protocol d/t abnormal labs.

## 2020-12-10 ENCOUNTER — Other Ambulatory Visit: Payer: Self-pay | Admitting: Family Medicine

## 2020-12-10 DIAGNOSIS — F411 Generalized anxiety disorder: Secondary | ICD-10-CM

## 2020-12-10 NOTE — Telephone Encounter (Signed)
  Requested medications are on the active medication list yes  Last visit 09/15/20  Future visit scheduled no, OV note states filled that time until pt could find a PCP in [place].  Notes to clinic no upcoming appt, pt has moved, please assess. Requested Prescriptions  Pending Prescriptions Disp Refills   busPIRone (BUSPAR) 7.5 MG tablet [Pharmacy Med Name: BUSPIRONE HCL 7.5 MG TABLET] 90 tablet     Sig: TAKE 1 TABLET BY MOUTH 3 TIMES DAILY.     Psychiatry: Anxiolytics/Hypnotics - Non-controlled Passed - 12/10/2020  2:44 AM      Passed - Valid encounter within last 6 months    Recent Outpatient Visits           2 months ago Prediabetes   [place], DO   1 year ago Attention deficit disorder (ADD) without hyperactivity   [place], DO   1 year ago RUQ abdominal pain   [facility] Olin Hauser, DO   1 year ago COVID-19 virus infection   [place], Devonne Doughty, DO   1 year ago Prediabetes   [place], Devonne Doughty, DO

## 2020-12-14 ENCOUNTER — Other Ambulatory Visit: Payer: Self-pay | Admitting: Family Medicine

## 2020-12-14 DIAGNOSIS — F5104 Psychophysiologic insomnia: Secondary | ICD-10-CM

## 2020-12-14 NOTE — Telephone Encounter (Signed)
Requested Prescriptions  Pending Prescriptions Disp Refills  . traZODone (DESYREL) 100 MG tablet [Pharmacy Med Name: TRAZODONE 100 MG TABLET] 30 tablet 2    Sig: TAKE 1 TABLET BY MOUTH EVERYDAY AT BEDTIME     Psychiatry: Antidepressants - Serotonin Modulator Passed - 12/14/2020  1:00 PM      Passed - Completed PHQ-2 or PHQ-9 in the last 360 days      Passed - Valid encounter within last 6 months    Recent Outpatient Visits          3 months ago Prediabetes   [place], DO   1 year ago Attention deficit disorder (ADD) without hyperactivity   [place], DO   1 year ago RUQ abdominal pain   [facility] Olin Hauser, DO   1 year ago COVID-19 virus infection   [place], Devonne Doughty, DO   1 year ago Prediabetes   [place], Devonne Doughty, DO

## 2021-01-14 ENCOUNTER — Other Ambulatory Visit: Payer: Self-pay | Admitting: Family Medicine

## 2021-01-14 DIAGNOSIS — F411 Generalized anxiety disorder: Secondary | ICD-10-CM

## 2021-01-15 NOTE — Telephone Encounter (Signed)
Requested medication (s) are due for refill today: one month early  Requested medication (s) are on the active medication list: yes  Last refill:  11/13/20  Future visit scheduled: no  Notes to clinic:  last visit in Aug MD note states she has moved to [place] [facility] and will need to get a provider there. Please assess.   Requested Prescriptions  Pending Prescriptions Disp Refills   busPIRone (BUSPAR) 7.5 MG tablet [Pharmacy Med Name: BUSPIRONE HCL 7.5 MG TABLET] 90 tablet     Sig: TAKE 1 TABLET BY MOUTH 3 TIMES DAILY.     Psychiatry: Anxiolytics/Hypnotics - Non-controlled Passed - 01/14/2021  9:07 AM      Passed - Valid encounter within last 6 months    Recent Outpatient Visits           4 months ago Prediabetes   [place], DO   1 year ago Attention deficit disorder (ADD) without hyperactivity   [place], DO   1 year ago RUQ abdominal pain   [facility] Olin Hauser, DO   1 year ago COVID-19 virus infection   [place], Devonne Doughty, DO   1 year ago Prediabetes   [place], Devonne Doughty, DO

## 2021-03-13 ENCOUNTER — Other Ambulatory Visit: Payer: Self-pay | Admitting: Family Medicine

## 2021-03-13 DIAGNOSIS — F5104 Psychophysiologic insomnia: Secondary | ICD-10-CM

## 2021-03-13 DIAGNOSIS — F419 Anxiety disorder, unspecified: Secondary | ICD-10-CM

## 2021-03-13 DIAGNOSIS — F32A Depression, unspecified: Secondary | ICD-10-CM

## 2021-03-14 NOTE — Telephone Encounter (Signed)
Requested Prescriptions  Pending Prescriptions Disp Refills   traZODone (DESYREL) 100 MG tablet [Pharmacy Med Name: TRAZODONE 100 MG TABLET] 30 tablet 2    Sig: TAKE 1 TABLET BY MOUTH EVERYDAY AT BEDTIME     Psychiatry: Antidepressants - Serotonin Modulator Passed - 03/13/2021 12:50 AM      Passed - Completed PHQ-2 or PHQ-9 in the last 360 days      Passed - Valid encounter within last 6 months    Recent Outpatient Visits          6 months ago Prediabetes   [place], DO   1 year ago Attention deficit disorder (ADD) without hyperactivity   [place], DO   1 year ago RUQ abdominal pain   [facility] Olin Hauser, DO   1 year ago COVID-19 virus infection   [place], Devonne Doughty, DO   2 years ago Prediabetes   [place], DO              escitalopram (LEXAPRO) 20 MG tablet [Pharmacy Med Name: ESCITALOPRAM 20 MG TABLET] 30 tablet 2    Sig: TAKE 1 [place]     Psychiatry:  Antidepressants - SSRI Passed - 03/13/2021 12:50 AM      Passed - Completed PHQ-2 or PHQ-9 in the last 360 days      Passed - Valid encounter within last 6 months    Recent Outpatient Visits          6 months ago Prediabetes   [place], DO   1 year ago Attention deficit disorder (ADD) without hyperactivity   [place], DO   1 year ago RUQ abdominal pain   [facility] Olin Hauser, DO   1 year ago COVID-19 virus infection   [place], Devonne Doughty, DO   2 years ago Prediabetes   [place], Devonne Doughty, DO

## 2021-05-23 ENCOUNTER — Other Ambulatory Visit: Payer: Self-pay | Admitting: Family Medicine

## 2021-05-23 DIAGNOSIS — F5104 Psychophysiologic insomnia: Secondary | ICD-10-CM

## 2021-05-23 DIAGNOSIS — E039 Hypothyroidism, unspecified: Secondary | ICD-10-CM

## 2021-05-23 DIAGNOSIS — F411 Generalized anxiety disorder: Secondary | ICD-10-CM

## 2021-05-23 DIAGNOSIS — F32A Depression, unspecified: Secondary | ICD-10-CM

## 2021-05-25 NOTE — Telephone Encounter (Signed)
Will refuse the medications due to patient moved to [facility] and no longer a patient at [facility] per last OV note.  ? ?

## 2021-06-16 ENCOUNTER — Other Ambulatory Visit: Payer: Self-pay | Admitting: Family Medicine

## 2021-06-16 DIAGNOSIS — F5104 Psychophysiologic insomnia: Secondary | ICD-10-CM

## 2021-06-16 DIAGNOSIS — N202 Calculus of kidney with calculus of ureter: Secondary | ICD-10-CM | POA: Diagnosis not present

## 2021-06-16 DIAGNOSIS — R300 Dysuria: Secondary | ICD-10-CM | POA: Diagnosis not present

## 2021-06-16 DIAGNOSIS — R319 Hematuria, unspecified: Secondary | ICD-10-CM | POA: Diagnosis not present

## 2021-06-16 NOTE — Telephone Encounter (Signed)
Courtesy refill. appt scheduled for 06/21/21.  Requested Prescriptions  Pending Prescriptions Disp Refills  . traZODone (DESYREL) 100 MG tablet [Pharmacy Med Name: TRAZODONE 100 MG TABLET] 30 tablet 0    Sig: TAKE 1 TABLET BY MOUTH EVERYDAY AT BEDTIME     Psychiatry: Antidepressants - Serotonin Modulator Failed - 06/16/2021  1:17 PM      Failed - Valid encounter within last 6 months    Recent Outpatient Visits          9 months ago Prediabetes   [place], DO   2 years ago Attention deficit disorder (ADD) without hyperactivity   [place], DO   2 years ago RUQ abdominal pain   [facility] Olin Hauser, DO   2 years ago COVID-19 virus infection   [place], DO   2 years ago Prediabetes   [place], DO      Future Appointments            In 5 days Parks Ranger, Devonne Doughty, DO [facility], [place] - Completed PHQ-2 or PHQ-9 in the last 360 days

## 2021-06-21 ENCOUNTER — Ambulatory Visit: Payer: Self-pay | Admitting: Family Medicine

## 2021-06-28 ENCOUNTER — Encounter: Payer: Self-pay | Admitting: Family Medicine

## 2021-06-28 ENCOUNTER — Ambulatory Visit: Payer: Self-pay | Admitting: Family Medicine

## 2021-06-28 VITALS — BP 120/70 | HR 83 | Ht 65.0 in | Wt 273.0 lb

## 2021-06-28 DIAGNOSIS — R7303 Prediabetes: Secondary | ICD-10-CM

## 2021-06-28 DIAGNOSIS — E782 Mixed hyperlipidemia: Secondary | ICD-10-CM | POA: Diagnosis not present

## 2021-06-28 DIAGNOSIS — Z1231 Encounter for screening mammogram for malignant neoplasm of breast: Secondary | ICD-10-CM

## 2021-06-28 DIAGNOSIS — Z1211 Encounter for screening for malignant neoplasm of colon: Secondary | ICD-10-CM

## 2021-06-28 DIAGNOSIS — F419 Anxiety disorder, unspecified: Secondary | ICD-10-CM

## 2021-06-28 DIAGNOSIS — F3341 Major depressive disorder, recurrent, in partial remission: Secondary | ICD-10-CM | POA: Diagnosis not present

## 2021-06-28 DIAGNOSIS — F5104 Psychophysiologic insomnia: Secondary | ICD-10-CM

## 2021-06-28 DIAGNOSIS — I10 Essential (primary) hypertension: Secondary | ICD-10-CM

## 2021-06-28 DIAGNOSIS — F32A Depression, unspecified: Secondary | ICD-10-CM

## 2021-06-28 DIAGNOSIS — F411 Generalized anxiety disorder: Secondary | ICD-10-CM

## 2021-06-28 DIAGNOSIS — E039 Hypothyroidism, unspecified: Secondary | ICD-10-CM

## 2021-06-28 MED ORDER — ESCITALOPRAM OXALATE 20 MG PO TABS: 20.0000 mg | ORAL_TABLET | Freq: Every day | ORAL | 3 refills | Status: AC

## 2021-06-28 MED ORDER — TRAZODONE HCL 100 MG PO TABS: 100.0000 mg | ORAL_TABLET | Freq: Every day | ORAL | 3 refills | Status: AC

## 2021-06-28 MED ORDER — LEVOTHYROXINE SODIUM 25 MCG PO TABS: 25.0000 ug | ORAL_TABLET | Freq: Every day | ORAL | 3 refills | Status: AC

## 2021-06-28 MED ORDER — BUSPIRONE HCL 7.5 MG PO TABS: ORAL_TABLET | ORAL | 3 refills | Status: DC

## 2021-06-28 NOTE — Progress Notes (Unsigned)
Subjective:    Patient ID: Sheri [name], female    DOB: May 22, 1965, 56 y.o.   MRN: [id number]  Sheri [name] is a 56 y.o. female presenting on 06/28/2021 for Anxiety   HPI  Hypothyroidism Continues Levothyroxine 72mg daily   Major Depression recurrent partial remission Anxiety Insomnia Needs re order Trazodone, Escitalopram, Buspar, doing well current regimen.   Morbid Obesity BMI / Pre-Diabetes   A1c up to 6.0 prior 5.7 to 5.8 1-2 years ago. Currently no therapy. Diet goals to improve Improving lifestyle.   Health Maintenance: No further Cervical CA Screening pap smears. S/p Total Hysterectomy  Colon CA Screening: Due for screening, agrees to Cologuard. Order test  Breast CA Screening: Due for mammogram screening. Last mammogram result >2 years ago. Ordered       09/15/2020   12:02 PM 06/04/2019    8:27 AM 03/19/2019    8:42 AM  Depression screen PHQ 2/9  Decreased Interest 0 0 0  Down, Depressed, Hopeless 1 0 0  PHQ - 2 Score 1 0 0  Altered sleeping 0  0  Tired, decreased energy 2  0  Change in appetite 0  0  Feeling bad or failure about yourself  0  0  Trouble concentrating 0  0  Moving slowly or fidgety/restless 0  0  Suicidal thoughts 0  0  PHQ-9 Score 3  0  Difficult doing work/chores Not difficult at all  Not difficult at all    Social History   Tobacco Use   Smoking status: Former    Packs/day: 1.00    Types: Cigarettes   Smokeless tobacco: Former   Tobacco comments:    quit 20 years  Vaping Use   Vaping Use: Never used  Substance Use Topics   Alcohol use: No    Alcohol/week: 0.0 standard drinks   Drug use: No    Review of Systems Per HPI unless specifically indicated above     Objective:    BP 120/70   Pulse 83   Ht _0  (1.651 m)   Wt 273 lb (123.8 kg)   LMP 09/13/2016   SpO2 99%   BMI 45.43 kg/m   Wt Readings from Last 3 Encounters:  06/28/21 273 lb (123.8 kg)  07/20/20 264 lb 3.2 oz (119.8 kg)  06/19/19 253 lb 3.2 oz  (114.9 kg)    Physical Exam Vitals and nursing note reviewed.  Constitutional:      General: She is not in acute distress.    Appearance: She is well-developed. She is not diaphoretic.     Comments: Well-appearing, comfortable, cooperative  HENT:     Head: Normocephalic and atraumatic.  Eyes:     General:        Right eye: No discharge.        Left eye: No discharge.     Conjunctiva/sclera: Conjunctivae normal.  Neck:     Thyroid: No thyromegaly.  Cardiovascular:     Rate and Rhythm: Normal rate and regular rhythm.     Heart sounds: Normal heart sounds. No murmur heard. Pulmonary:     Effort: Pulmonary effort is normal. No respiratory distress.     Breath sounds: Normal breath sounds. No wheezing or rales.  Musculoskeletal:        General: Normal range of motion.     Cervical back: Normal range of motion and neck supple.  Lymphadenopathy:     Cervical: No cervical adenopathy.  Skin:    General: Skin  is warm and dry.     Findings: No erythema or rash.  Neurological:     Mental Status: She is alert and oriented to person, place, and time.  Psychiatric:        Behavior: Behavior normal.     Comments: Well groomed, good eye contact, normal speech and thoughts     Results for orders placed or performed in visit on 07/20/20  Comp Met (CMET)  Result Value Ref Range   Glucose 103 (H) 65 - 99 mg/dL   BUN 17 6 - 24 mg/dL   Creatinine, Ser 0.73 0.57 - 1.00 mg/dL   eGFR 96 >59 mL/min/1.73   BUN/Creatinine Ratio 23 9 - 23   Sodium 138 134 - 144 mmol/L   Potassium 4.4 3.5 - 5.2 mmol/L   Chloride 100 96 - 106 mmol/L   CO2 25 20 - 29 mmol/L   Calcium 9.3 8.7 - 10.2 mg/dL   Total Protein 6.8 6.0 - 8.5 g/dL   Albumin 4.5 3.8 - 4.9 g/dL   Globulin, Total 2.3 1.5 - 4.5 g/dL   Albumin/Globulin Ratio 2.0 1.2 - 2.2   Bilirubin Total 0.4 0.0 - 1.2 mg/dL   Alkaline Phosphatase 123 (H) 44 - 121 IU/L   AST 49 (H) 0 - 40 IU/L   ALT 69 (H) 0 - 32 IU/L  Lipid Profile  Result Value Ref  Range   Cholesterol, Total 162 100 - 199 mg/dL   Triglycerides 118 0 - 149 mg/dL   HDL 34 (L) >39 mg/dL   VLDL Cholesterol Cal 22 5 - 40 mg/dL   LDL Chol Calc (NIH) 106 (H) 0 - 99 mg/dL   Chol/HDL Ratio 4.8 (H) 0.0 - 4.4 ratio  CBC  Result Value Ref Range   WBC 9.0 3.4 - 10.8 x10E3/uL   RBC 4.94 3.77 - 5.28 x10E6/uL   Hemoglobin 14.2 11.1 - 15.9 g/dL   Hematocrit 41.9 34.0 - 46.6 %   MCV 85 79 - 97 fL   MCH 28.7 26.6 - 33.0 pg   MCHC 33.9 31.5 - 35.7 g/dL   RDW 13.5 11.7 - 15.4 %   Platelets 246 150 - 450 x10E3/uL  HgB A1c  Result Value Ref Range   Hgb A1c MFr Bld 6.0 (H) 4.8 - 5.6 %   Est. average glucose Bld gHb Est-mCnc 126 mg/dL  TSH  Result Value Ref Range   TSH 4.670 (H) 0.450 - 4.500 uIU/mL      Assessment & Plan:   Problem List Items Addressed This Visit     Recurrent major depression in partial remission (HCC)   Relevant Medications   traZODone (DESYREL) 100 MG tablet   busPIRone (BUSPAR) 7.5 MG tablet   escitalopram (LEXAPRO) 20 MG tablet   Other Relevant Orders   COMPLETE METABOLIC PANEL WITH GFR   CBC with Differential/Platelet   Psychophysiological insomnia   Relevant Medications   traZODone (DESYREL) 100 MG tablet   Prediabetes - Primary   Relevant Orders   Hemoglobin A1c   Hyperlipidemia   Relevant Orders   Lipid panel   GAD (generalized anxiety disorder)   Relevant Medications   traZODone (DESYREL) 100 MG tablet   busPIRone (BUSPAR) 7.5 MG tablet   escitalopram (LEXAPRO) 20 MG tablet   Other Relevant Orders   COMPLETE METABOLIC PANEL WITH GFR   CBC with Differential/Platelet   Essential hypertension   Relevant Orders   COMPLETE METABOLIC PANEL WITH GFR   CBC with Differential/Platelet   Acquired  hypothyroidism   Relevant Medications   levothyroxine (SYNTHROID) 25 MCG tablet   Other Relevant Orders   TSH   T4, free   Other Visit Diagnoses     Encounter for screening mammogram for malignant neoplasm of breast       Relevant Orders    MM 3D SCREEN BREAST BILATERAL   Colon cancer screening       Relevant Orders   Cologuard   Anxiety and depression       Relevant Medications   traZODone (DESYREL) 100 MG tablet   busPIRone (BUSPAR) 7.5 MG tablet   escitalopram (LEXAPRO) 20 MG tablet       Note she remains long distance, lives in [place] but previously resides in [place] able to come back intermittently. Discussed recommendation to locate local doctor in [facility] but able to continue to manage long distance for now while she searches for new PCP.  Anxiety Depression Recurrent depression in partial remission GAD Insomnia Improved overall on current therapy, stable Will re order medications today. Continue same dosages, Trazodone, Escitalopram , Buspar.  Hypothyroidism Re order, check labs thyroid Continue Levothyroxine 25 mcg daily  Hyperlipidemia Controlled on Statin therapy  Rosuvastatin 66m daily Check lipids  HTN Controlled Copntinue therapy.  Mammogram  Due for routine colon cancer screening.  - Discussion today about recommendations for either Colonoscopy or Cologuard screening, benefits and risks of screening, interested in Cologuard, understands that if positive then recommendation is for diagnostic colonoscopy to follow-up. - Ordered Cologuard today  Orders Placed This Encounter  Procedures   MM 3D SCREEN BREAST BILATERAL    Standing Status:   Future    Standing Expiration Date:   06/29/2022    Order Specific Question:   Reason for Exam (SYMPTOM  OR DIAGNOSIS REQUIRED)    Answer:   Screening bilateral 3D Mammogram [place]    Order Specific Question:   Preferred imaging location?    Answer:   [facility]   COMPLETE METABOLIC PANEL WITH GFR   CBC with Differential/Platelet   Lipid panel    Order Specific Question:   Has the patient fasted?    Answer:   Yes   Hemoglobin A1c   TSH   T4, free   Cologuard      Meds ordered this encounter  Medications   traZODone (DESYREL) 100 MG tablet    Sig:  Take 1 tablet (100 mg total) by mouth at bedtime.    Dispense:  90 tablet    Refill:  3    DX Code Needed  . F51.04   busPIRone (BUSPAR) 7.5 MG tablet    Sig: TAKE 1 TABLET (7.5 MG TOTAL) BY MOUTH 3 (THREE) TIMES DAILY.    Dispense:  270 tablet    Refill:  3   escitalopram (LEXAPRO) 20 MG tablet    Sig: Take 1 tablet (20 mg total) by mouth daily.    Dispense:  90 tablet    Refill:  3   levothyroxine (SYNTHROID) 25 MCG tablet    Sig: Take 1 tablet (25 mcg total) by mouth daily before breakfast.    Dispense:  90 tablet    Refill:  3     Follow up plan: Return in about 1 year (around 06/29/2022) for 1 year follow-up.   ANobie Putnam [facility] 06/28/2021, 1:34 PM

## 2021-06-28 NOTE — Patient Instructions (Addendum)
Thank you for coming to the office today.  Refilled meds  Labs ordered today.  For Mammogram screening for breast cancer   Call the [place] below anytime to schedule your own appointment now that order has been placed.  [facility] [place], [place] [address] Phone: [phone]   ------------------------------  Ordered the Cologuard (home kit) test for colon cancer screening. Stay tuned for further updates.  It will be shipped to you directly. If not received in 2-4 weeks, call us or the company.   If you send it back and no results are received in 2-4 weeks, call us or the company as well!   Colon Cancer Screening: - For all adults age 66+ routine colon cancer screening is highly recommended.     - Recent guidelines from [place] recommend starting age of 93 - Early detection of colon cancer is important, because often there are no warning signs or symptoms, also if found early usually it can be cured. Late stage is hard to treat.   - If Cologuard is NEGATIVE, then it is good for 3 years before next due - If Cologuard is POSITIVE, then it is strongly advised to get a Colonoscopy, which allows the GI doctor to locate the source of the cancer or polyp (even very early stage) and treat it by removing it. ------------------------- Follow instructions to collect sample, you may call the company for any help or questions, 24/7 telephone support at [phone].    Please schedule a Follow-up Appointment to: Return in about 1 year (around 06/29/2022) for 1 year follow-up.  If you have any other questions or concerns, please feel free to call the office or send a message through [place]. You may also schedule an earlier appointment if necessary.  Additionally, you may be receiving a survey about your experience at our office within a few days to 1 week by e-mail or mail. We value your  feedback.  [name], DO [place]

## 2021-06-29 LAB — CBC WITH DIFFERENTIAL/PLATELET
Absolute Monocytes: 487 cells/uL (ref 200–950)
Basophils Absolute: 61 cells/uL (ref 0–200)
Basophils Relative: 0.7 %
Eosinophils Absolute: 235 cells/uL (ref 15–500)
Eosinophils Relative: 2.7 %
HCT: 43.4 % (ref 35.0–45.0)
Hemoglobin: 14.4 g/dL (ref 11.7–15.5)
Lymphs Abs: 2488 cells/uL (ref 850–3900)
MCH: 28.1 pg (ref 27.0–33.0)
MCHC: 33.2 g/dL (ref 32.0–36.0)
MCV: 84.6 fL (ref 80.0–100.0)
MPV: 12 fL (ref 7.5–12.5)
Monocytes Relative: 5.6 %
Neutro Abs: 5429 cells/uL (ref 1500–7800)
Neutrophils Relative %: 62.4 %
Platelets: 266 10*3/uL (ref 140–400)
RBC: 5.13 10*6/uL — ABNORMAL HIGH (ref 3.80–5.10)
RDW: 13.3 % (ref 11.0–15.0)
Total Lymphocyte: 28.6 %
WBC: 8.7 10*3/uL (ref 3.8–10.8)

## 2021-06-29 LAB — LIPID PANEL
Cholesterol: 126 mg/dL (ref <200)
HDL: 36 mg/dL — ABNORMAL LOW (ref >=50)
LDL Cholesterol (Calc): 69 mg/dL (calc)
Non-HDL Cholesterol (Calc): 90 mg/dL (calc) (ref <130)
Total CHOL/HDL Ratio: 3.5 (calc) (ref <5.0)
Triglycerides: 125 mg/dL (ref <150)

## 2021-06-29 LAB — TSH: TSH: 3.15 mIU/L (ref 0.40–4.50)

## 2021-06-29 LAB — COMPLETE METABOLIC PANEL WITH GFR
AG Ratio: 1.7 (calc) (ref 1.0–2.5)
ALT: 45 U/L — ABNORMAL HIGH (ref 6–29)
AST: 44 U/L — ABNORMAL HIGH (ref 10–35)
Albumin: 4.5 g/dL (ref 3.6–5.1)
Alkaline phosphatase (APISO): 99 U/L (ref 37–153)
BUN: 18 mg/dL (ref 7–25)
CO2: 25 mmol/L (ref 20–32)
Calcium: 9.7 mg/dL (ref 8.6–10.4)
Chloride: 105 mmol/L (ref 98–110)
Creat: 0.79 mg/dL (ref 0.50–1.03)
Globulin: 2.7 g/dL (calc) (ref 1.9–3.7)
Glucose, Bld: 90 mg/dL (ref 65–139)
Potassium: 4.6 mmol/L (ref 3.5–5.3)
Sodium: 140 mmol/L (ref 135–146)
Total Bilirubin: 0.6 mg/dL (ref 0.2–1.2)
Total Protein: 7.2 g/dL (ref 6.1–8.1)
eGFR: 87 mL/min/{1.73_m2} (ref >=60)

## 2021-06-29 LAB — HEMOGLOBIN A1C
Hgb A1c MFr Bld: 6 % of total Hgb — ABNORMAL HIGH (ref <5.7)
Mean Plasma Glucose: 126 mg/dL
eAG (mmol/L): 7 mmol/L

## 2021-06-29 LAB — T4, FREE: Free T4: 1.2 ng/dL (ref 0.8–1.8)

## 2021-08-12 ENCOUNTER — Other Ambulatory Visit: Payer: Self-pay | Admitting: Interventional Cardiology

## 2021-08-13 ENCOUNTER — Other Ambulatory Visit: Payer: Self-pay | Admitting: Interventional Cardiology

## 2021-09-07 DIAGNOSIS — R1011 Right upper quadrant pain: Secondary | ICD-10-CM | POA: Diagnosis not present

## 2021-09-07 DIAGNOSIS — K30 Functional dyspepsia: Secondary | ICD-10-CM | POA: Diagnosis not present

## 2021-09-08 ENCOUNTER — Other Ambulatory Visit: Payer: Self-pay | Admitting: Interventional Cardiology

## 2021-09-12 ENCOUNTER — Other Ambulatory Visit: Payer: Self-pay

## 2021-09-12 ENCOUNTER — Other Ambulatory Visit: Payer: Self-pay | Admitting: Interventional Cardiology

## 2021-09-18 DIAGNOSIS — R0789 Other chest pain: Secondary | ICD-10-CM | POA: Diagnosis not present

## 2021-09-18 DIAGNOSIS — I10 Essential (primary) hypertension: Secondary | ICD-10-CM | POA: Diagnosis not present

## 2021-09-18 DIAGNOSIS — Z79899 Other long term (current) drug therapy: Secondary | ICD-10-CM | POA: Diagnosis not present

## 2021-09-18 DIAGNOSIS — I2510 Atherosclerotic heart disease of native coronary artery without angina pectoris: Secondary | ICD-10-CM | POA: Diagnosis not present

## 2021-09-18 DIAGNOSIS — E785 Hyperlipidemia, unspecified: Secondary | ICD-10-CM | POA: Diagnosis not present

## 2021-09-18 DIAGNOSIS — R079 Chest pain, unspecified: Secondary | ICD-10-CM | POA: Diagnosis not present

## 2021-09-18 DIAGNOSIS — E669 Obesity, unspecified: Secondary | ICD-10-CM | POA: Diagnosis not present

## 2021-09-18 DIAGNOSIS — Z6841 Body Mass Index (BMI) 40.0 and over, adult: Secondary | ICD-10-CM | POA: Diagnosis not present

## 2021-09-22 ENCOUNTER — Other Ambulatory Visit: Payer: Self-pay | Admitting: Interventional Cardiology

## 2021-09-26 ENCOUNTER — Other Ambulatory Visit: Payer: Self-pay | Admitting: Interventional Cardiology

## 2021-10-04 ENCOUNTER — Other Ambulatory Visit: Payer: Self-pay | Admitting: Interventional Cardiology

## 2021-10-04 NOTE — Telephone Encounter (Signed)
Patient has moved to [facility] and was to establish with cardiology there.

## 2021-10-26 DIAGNOSIS — R7303 Prediabetes: Secondary | ICD-10-CM | POA: Diagnosis not present

## 2021-10-26 DIAGNOSIS — I10 Essential (primary) hypertension: Secondary | ICD-10-CM | POA: Diagnosis not present

## 2021-10-26 DIAGNOSIS — Z713 Dietary counseling and surveillance: Secondary | ICD-10-CM | POA: Diagnosis not present

## 2021-10-26 DIAGNOSIS — I252 Old myocardial infarction: Secondary | ICD-10-CM | POA: Diagnosis not present

## 2021-10-26 DIAGNOSIS — E039 Hypothyroidism, unspecified: Secondary | ICD-10-CM | POA: Diagnosis not present

## 2021-11-10 ENCOUNTER — Telehealth: Payer: Self-pay

## 2021-11-10 DIAGNOSIS — R92323 Mammographic fibroglandular density, bilateral breasts: Secondary | ICD-10-CM | POA: Diagnosis not present

## 2021-11-10 DIAGNOSIS — Z1231 Encounter for screening mammogram for malignant neoplasm of breast: Secondary | ICD-10-CM | POA: Diagnosis not present

## 2021-11-10 NOTE — Telephone Encounter (Signed)
Sharyn Lull reached out to [place] to see if we had any mammograms in our system for pt. We do have some images and reports in [place]. We need a medical release signed from pt to release this. Called Sharyn Lull back at [phone] and she was unavailable. I was asked to call back at a later time.

## 2021-11-11 NOTE — Telephone Encounter (Signed)
Spoke to [name], aware we have report for pt. She is going to fax a medical release.

## 2022-01-20 DIAGNOSIS — J209 Acute bronchitis, unspecified: Secondary | ICD-10-CM | POA: Diagnosis not present

## 2022-01-20 DIAGNOSIS — R051 Acute cough: Secondary | ICD-10-CM | POA: Diagnosis not present

## 2022-01-20 DIAGNOSIS — R0602 Shortness of breath: Secondary | ICD-10-CM | POA: Diagnosis not present

## 2022-01-23 DIAGNOSIS — I2510 Atherosclerotic heart disease of native coronary artery without angina pectoris: Secondary | ICD-10-CM | POA: Diagnosis not present

## 2022-01-23 DIAGNOSIS — J069 Acute upper respiratory infection, unspecified: Secondary | ICD-10-CM | POA: Diagnosis not present

## 2022-01-23 DIAGNOSIS — Z955 Presence of coronary angioplasty implant and graft: Secondary | ICD-10-CM | POA: Diagnosis not present

## 2022-01-23 DIAGNOSIS — Z91040 Latex allergy status: Secondary | ICD-10-CM | POA: Diagnosis not present

## 2022-01-23 DIAGNOSIS — R059 Cough, unspecified: Secondary | ICD-10-CM | POA: Diagnosis not present

## 2022-01-23 DIAGNOSIS — R0602 Shortness of breath: Secondary | ICD-10-CM | POA: Diagnosis not present

## 2022-01-23 DIAGNOSIS — Z880 Allergy status to penicillin: Secondary | ICD-10-CM | POA: Diagnosis not present

## 2022-01-23 DIAGNOSIS — J209 Acute bronchitis, unspecified: Secondary | ICD-10-CM | POA: Diagnosis not present

## 2022-01-26 DIAGNOSIS — J209 Acute bronchitis, unspecified: Secondary | ICD-10-CM | POA: Diagnosis not present

## 2022-02-27 DIAGNOSIS — Z1159 Encounter for screening for other viral diseases: Secondary | ICD-10-CM | POA: Diagnosis not present

## 2022-02-27 DIAGNOSIS — J0110 Acute frontal sinusitis, unspecified: Secondary | ICD-10-CM | POA: Diagnosis not present

## 2022-02-27 DIAGNOSIS — I10 Essential (primary) hypertension: Secondary | ICD-10-CM | POA: Diagnosis not present

## 2022-02-27 DIAGNOSIS — F418 Other specified anxiety disorders: Secondary | ICD-10-CM | POA: Diagnosis not present

## 2022-03-23 DIAGNOSIS — B349 Viral infection, unspecified: Secondary | ICD-10-CM | POA: Diagnosis not present

## 2022-03-23 DIAGNOSIS — Z1331 Encounter for screening for depression: Secondary | ICD-10-CM | POA: Diagnosis not present

## 2022-03-23 DIAGNOSIS — Z6841 Body Mass Index (BMI) 40.0 and over, adult: Secondary | ICD-10-CM | POA: Diagnosis not present

## 2022-03-23 DIAGNOSIS — J302 Other seasonal allergic rhinitis: Secondary | ICD-10-CM | POA: Diagnosis not present

## 2022-05-22 DIAGNOSIS — I10 Essential (primary) hypertension: Secondary | ICD-10-CM | POA: Diagnosis not present

## 2022-05-22 DIAGNOSIS — E782 Mixed hyperlipidemia: Secondary | ICD-10-CM | POA: Diagnosis not present

## 2022-05-22 DIAGNOSIS — R7303 Prediabetes: Secondary | ICD-10-CM | POA: Diagnosis not present

## 2022-05-22 DIAGNOSIS — Z6841 Body Mass Index (BMI) 40.0 and over, adult: Secondary | ICD-10-CM | POA: Diagnosis not present

## 2022-05-22 DIAGNOSIS — F418 Other specified anxiety disorders: Secondary | ICD-10-CM | POA: Diagnosis not present

## 2022-05-22 DIAGNOSIS — Z1331 Encounter for screening for depression: Secondary | ICD-10-CM | POA: Diagnosis not present

## 2022-05-22 DIAGNOSIS — E039 Hypothyroidism, unspecified: Secondary | ICD-10-CM | POA: Diagnosis not present

## 2022-07-10 ENCOUNTER — Other Ambulatory Visit: Payer: Self-pay | Admitting: Family Medicine

## 2022-07-10 DIAGNOSIS — F411 Generalized anxiety disorder: Secondary | ICD-10-CM

## 2022-07-11 NOTE — Telephone Encounter (Signed)
Unable to refill per protocol, appointment needed.   Requested Prescriptions  Pending Prescriptions Disp Refills   busPIRone (BUSPAR) 7.5 MG tablet [Pharmacy Med Name: BUSPIRONE HCL 7.5 MG TABLET] 90 tablet 11    Sig: TAKE 1 TABLET BY MOUTH 3 TIMES DAILY.     Psychiatry: Anxiolytics/Hypnotics - Non-controlled Failed - 07/10/2022  2:22 AM      Failed - Valid encounter within last 12 months    Recent Outpatient Visits           1 year ago Prediabetes   [place] [facility] Smitty Cords, DO   1 year ago Prediabetes   [place] [facility] Smitty Cords, DO   3 years ago Attention deficit disorder (ADD) without hyperactivity   [place] [facility] Smitty Cords, DO   3 years ago RUQ abdominal pain   [place] [facility] Smitty Cords, DO   3 years ago COVID-19 virus infection   [place] [place], Netta Ne[place], [place]

## 2022-08-16 ENCOUNTER — Other Ambulatory Visit: Payer: Self-pay | Admitting: Family Medicine

## 2022-08-16 DIAGNOSIS — F411 Generalized anxiety disorder: Secondary | ICD-10-CM

## 2022-08-17 NOTE — Telephone Encounter (Signed)
Patient needs OV for additional refills. 30 day supply given until OV can be made.  Requested Prescriptions  Pending Prescriptions Disp Refills   busPIRone (BUSPAR) 7.5 MG tablet [Pharmacy Med Name: BUSPIRONE HCL 7.5 MG TABLET] 90 tablet 0    Sig: TAKE 1 TABLET BY MOUTH 3 TIMES DAILY.     Psychiatry: Anxiolytics/Hypnotics - Non-controlled Failed - 08/16/2022  3:47 PM      Failed - Valid encounter within last 12 months    Recent Outpatient Visits           1 year ago Prediabetes   [place] [facility] Smitty Cords, DO   1 year ago Prediabetes   [place] [facility] Smitty Cords, DO   3 years ago Attention deficit disorder (ADD) without hyperactivity   [place] [facility] Smitty Cords, DO   3 years ago RUQ abdominal pain   [place] [facility] Smitty Cords, DO   3 years ago COVID-19 virus infection   [place] [facility] [place], Netta Ne[place], [place]

## 2022-08-21 DIAGNOSIS — I10 Essential (primary) hypertension: Secondary | ICD-10-CM | POA: Diagnosis not present

## 2022-08-21 DIAGNOSIS — R339 Retention of urine, unspecified: Secondary | ICD-10-CM | POA: Diagnosis not present

## 2022-08-21 DIAGNOSIS — E039 Hypothyroidism, unspecified: Secondary | ICD-10-CM | POA: Diagnosis not present

## 2022-08-21 DIAGNOSIS — E782 Mixed hyperlipidemia: Secondary | ICD-10-CM | POA: Diagnosis not present

## 2022-09-18 ENCOUNTER — Other Ambulatory Visit: Payer: Self-pay | Admitting: Family Medicine

## 2022-09-18 DIAGNOSIS — Z955 Presence of coronary angioplasty implant and graft: Secondary | ICD-10-CM | POA: Diagnosis not present

## 2022-09-18 DIAGNOSIS — M79602 Pain in left arm: Secondary | ICD-10-CM | POA: Diagnosis not present

## 2022-09-18 DIAGNOSIS — Z8249 Family history of ischemic heart disease and other diseases of the circulatory system: Secondary | ICD-10-CM | POA: Diagnosis not present

## 2022-09-18 DIAGNOSIS — Z87442 Personal history of urinary calculi: Secondary | ICD-10-CM | POA: Diagnosis not present

## 2022-09-18 DIAGNOSIS — Z9889 Other specified postprocedural states: Secondary | ICD-10-CM | POA: Diagnosis not present

## 2022-09-18 DIAGNOSIS — Z90710 Acquired absence of both cervix and uterus: Secondary | ICD-10-CM | POA: Diagnosis not present

## 2022-09-18 DIAGNOSIS — F411 Generalized anxiety disorder: Secondary | ICD-10-CM

## 2022-09-18 DIAGNOSIS — I2510 Atherosclerotic heart disease of native coronary artery without angina pectoris: Secondary | ICD-10-CM | POA: Diagnosis not present

## 2022-09-18 DIAGNOSIS — Z7902 Long term (current) use of antithrombotics/antiplatelets: Secondary | ICD-10-CM | POA: Diagnosis not present

## 2022-09-18 DIAGNOSIS — R000 Tachycardia, unspecified: Secondary | ICD-10-CM | POA: Diagnosis not present

## 2022-09-18 DIAGNOSIS — I10 Essential (primary) hypertension: Secondary | ICD-10-CM | POA: Diagnosis not present

## 2022-09-18 DIAGNOSIS — I252 Old myocardial infarction: Secondary | ICD-10-CM | POA: Diagnosis not present

## 2022-09-18 DIAGNOSIS — Z79899 Other long term (current) drug therapy: Secondary | ICD-10-CM | POA: Diagnosis not present

## 2022-09-18 DIAGNOSIS — E079 Disorder of thyroid, unspecified: Secondary | ICD-10-CM | POA: Diagnosis not present

## 2022-09-18 DIAGNOSIS — Z79890 Hormone replacement therapy (postmenopausal): Secondary | ICD-10-CM | POA: Diagnosis not present

## 2022-09-18 DIAGNOSIS — R002 Palpitations: Secondary | ICD-10-CM | POA: Diagnosis not present

## 2022-09-18 DIAGNOSIS — R079 Chest pain, unspecified: Secondary | ICD-10-CM | POA: Diagnosis not present

## 2022-09-18 DIAGNOSIS — Z9049 Acquired absence of other specified parts of digestive tract: Secondary | ICD-10-CM | POA: Diagnosis not present

## 2022-09-19 NOTE — Telephone Encounter (Signed)
Call to patient- she has now moved to [facility] and established care there.  Requested Prescriptions  Pending Prescriptions Disp Refills   busPIRone (BUSPAR) 7.5 MG tablet [Pharmacy Med Name: BUSPIRONE HCL 7.5 MG TABLET] 90 tablet 0    Sig: TAKE 1 TABLET BY MOUTH 3 TIMES DAILY.     Psychiatry: Anxiolytics/Hypnotics - Non-controlled Failed - 09/18/2022  2:33 AM      Failed - Valid encounter within last 12 months    Recent Outpatient Visits           1 year ago Prediabetes   [place] [facility] [place], Netta Neat, DO   2 years ago Prediabetes   [place] [facility] Smitty Cords, DO   3 years ago Attention deficit disorder (ADD) without hyperactivity   [place] [facility] Smitty Cords, DO   3 years ago RUQ abdominal pain   [place] [facility] Smitty Cords, DO   3 years ago COVID-19 virus infection   [place] [facility] [place], Netta Neat, [place]

## 2022-09-20 DIAGNOSIS — I200 Unstable angina: Secondary | ICD-10-CM | POA: Diagnosis not present

## 2022-09-20 DIAGNOSIS — R809 Proteinuria, unspecified: Secondary | ICD-10-CM | POA: Diagnosis not present

## 2022-09-20 DIAGNOSIS — I2510 Atherosclerotic heart disease of native coronary artery without angina pectoris: Secondary | ICD-10-CM | POA: Diagnosis not present

## 2022-09-20 DIAGNOSIS — I10 Essential (primary) hypertension: Secondary | ICD-10-CM | POA: Diagnosis not present

## 2022-09-20 DIAGNOSIS — F419 Anxiety disorder, unspecified: Secondary | ICD-10-CM | POA: Diagnosis not present

## 2022-09-20 DIAGNOSIS — I252 Old myocardial infarction: Secondary | ICD-10-CM | POA: Diagnosis not present

## 2022-09-20 DIAGNOSIS — E039 Hypothyroidism, unspecified: Secondary | ICD-10-CM | POA: Diagnosis not present

## 2022-09-20 DIAGNOSIS — M25512 Pain in left shoulder: Secondary | ICD-10-CM | POA: Diagnosis not present

## 2022-09-20 DIAGNOSIS — E785 Hyperlipidemia, unspecified: Secondary | ICD-10-CM | POA: Diagnosis not present

## 2022-09-20 DIAGNOSIS — Z79890 Hormone replacement therapy (postmenopausal): Secondary | ICD-10-CM | POA: Diagnosis not present

## 2022-09-20 DIAGNOSIS — Z7902 Long term (current) use of antithrombotics/antiplatelets: Secondary | ICD-10-CM | POA: Diagnosis not present

## 2022-09-20 DIAGNOSIS — Z1152 Encounter for screening for COVID-19: Secondary | ICD-10-CM | POA: Diagnosis not present

## 2022-09-20 DIAGNOSIS — R079 Chest pain, unspecified: Secondary | ICD-10-CM | POA: Diagnosis not present

## 2022-09-20 DIAGNOSIS — R011 Cardiac murmur, unspecified: Secondary | ICD-10-CM | POA: Diagnosis not present

## 2022-09-20 DIAGNOSIS — Z955 Presence of coronary angioplasty implant and graft: Secondary | ICD-10-CM | POA: Diagnosis not present

## 2022-09-20 DIAGNOSIS — Z87442 Personal history of urinary calculi: Secondary | ICD-10-CM | POA: Diagnosis not present

## 2022-09-20 DIAGNOSIS — Z0389 Encounter for observation for other suspected diseases and conditions ruled out: Secondary | ICD-10-CM | POA: Diagnosis not present

## 2022-09-20 DIAGNOSIS — R9431 Abnormal electrocardiogram [ECG] [EKG]: Secondary | ICD-10-CM | POA: Diagnosis not present

## 2022-09-20 DIAGNOSIS — I25110 Atherosclerotic heart disease of native coronary artery with unstable angina pectoris: Secondary | ICD-10-CM | POA: Diagnosis not present

## 2022-09-20 DIAGNOSIS — Q245 Malformation of coronary vessels: Secondary | ICD-10-CM | POA: Diagnosis not present

## 2022-09-20 DIAGNOSIS — I2582 Chronic total occlusion of coronary artery: Secondary | ICD-10-CM | POA: Diagnosis not present

## 2022-09-20 DIAGNOSIS — Z7982 Long term (current) use of aspirin: Secondary | ICD-10-CM | POA: Diagnosis not present

## 2022-09-20 DIAGNOSIS — M79602 Pain in left arm: Secondary | ICD-10-CM | POA: Diagnosis not present

## 2022-09-20 DIAGNOSIS — R0789 Other chest pain: Secondary | ICD-10-CM | POA: Diagnosis not present

## 2022-11-16 DIAGNOSIS — I2510 Atherosclerotic heart disease of native coronary artery without angina pectoris: Secondary | ICD-10-CM | POA: Diagnosis not present

## 2022-11-16 DIAGNOSIS — R112 Nausea with vomiting, unspecified: Secondary | ICD-10-CM | POA: Diagnosis not present

## 2022-11-16 DIAGNOSIS — N3091 Cystitis, unspecified with hematuria: Secondary | ICD-10-CM | POA: Diagnosis not present

## 2022-11-16 DIAGNOSIS — M549 Dorsalgia, unspecified: Secondary | ICD-10-CM | POA: Diagnosis not present

## 2022-11-16 DIAGNOSIS — N3000 Acute cystitis without hematuria: Secondary | ICD-10-CM | POA: Diagnosis not present

## 2022-11-16 DIAGNOSIS — R1030 Lower abdominal pain, unspecified: Secondary | ICD-10-CM | POA: Diagnosis not present

## 2022-11-16 DIAGNOSIS — Z79899 Other long term (current) drug therapy: Secondary | ICD-10-CM | POA: Diagnosis not present

## 2022-11-16 DIAGNOSIS — Z955 Presence of coronary angioplasty implant and graft: Secondary | ICD-10-CM | POA: Diagnosis not present
